# Patient Record
Sex: Female | Born: 1935 | Race: White | Hispanic: No | Smoking: Former smoker
Health system: Southern US, Community
[De-identification: ages and names within clinical notes are randomized; demographics above are authoritative.]

## PROBLEM LIST (undated history)

## (undated) DIAGNOSIS — F0280 Dementia in other diseases classified elsewhere without behavioral disturbance: Secondary | ICD-10-CM

## (undated) DIAGNOSIS — L03116 Cellulitis of left lower limb: Secondary | ICD-10-CM

## (undated) DIAGNOSIS — F329 Major depressive disorder, single episode, unspecified: Secondary | ICD-10-CM

## (undated) DIAGNOSIS — F32A Depression, unspecified: Secondary | ICD-10-CM

## (undated) DIAGNOSIS — G309 Alzheimer's disease, unspecified: Secondary | ICD-10-CM

## (undated) DIAGNOSIS — M1990 Unspecified osteoarthritis, unspecified site: Secondary | ICD-10-CM

## (undated) DIAGNOSIS — Z87442 Personal history of urinary calculi: Secondary | ICD-10-CM

## (undated) DIAGNOSIS — J209 Acute bronchitis, unspecified: Secondary | ICD-10-CM

## (undated) DIAGNOSIS — M353 Polymyalgia rheumatica: Secondary | ICD-10-CM

## (undated) DIAGNOSIS — L853 Xerosis cutis: Secondary | ICD-10-CM

## (undated) DIAGNOSIS — I10 Essential (primary) hypertension: Secondary | ICD-10-CM

## (undated) DIAGNOSIS — E7800 Pure hypercholesterolemia, unspecified: Secondary | ICD-10-CM

## (undated) DIAGNOSIS — H269 Unspecified cataract: Secondary | ICD-10-CM

## (undated) DIAGNOSIS — G3184 Mild cognitive impairment, so stated: Secondary | ICD-10-CM

## (undated) DIAGNOSIS — I8390 Asymptomatic varicose veins of unspecified lower extremity: Secondary | ICD-10-CM

## (undated) DIAGNOSIS — L309 Dermatitis, unspecified: Secondary | ICD-10-CM

## (undated) HISTORY — DX: Personal history of urinary calculi: Z87.442

## (undated) HISTORY — DX: Polymyalgia rheumatica: M35.3

## (undated) HISTORY — DX: Depression, unspecified: F32.A

## (undated) HISTORY — DX: Asymptomatic varicose veins of unspecified lower extremity: I83.90

## (undated) HISTORY — PX: JOINT REPLACEMENT: SHX530

## (undated) HISTORY — DX: Xerosis cutis: L85.3

## (undated) HISTORY — PX: OTHER SURGICAL HISTORY: SHX169

## (undated) HISTORY — DX: Unspecified cataract: H26.9

## (undated) HISTORY — DX: Alzheimer's disease, unspecified: G30.9

## (undated) HISTORY — DX: Unspecified osteoarthritis, unspecified site: M19.90

## (undated) HISTORY — DX: Pure hypercholesterolemia, unspecified: E78.00

## (undated) HISTORY — DX: Cellulitis of left lower limb: L03.116

## (undated) HISTORY — DX: Dermatitis, unspecified: L30.9

## (undated) HISTORY — DX: Essential (primary) hypertension: I10

## (undated) HISTORY — DX: Dementia in other diseases classified elsewhere, unspecified severity, without behavioral disturbance, psychotic disturbance, mood disturbance, and anxiety: F02.80

## (undated) HISTORY — PX: EYE SURGERY: SHX253

## (undated) HISTORY — DX: Mild cognitive impairment of uncertain or unknown etiology: G31.84

## (undated) HISTORY — DX: Major depressive disorder, single episode, unspecified: F32.9

## (undated) HISTORY — DX: Acute bronchitis, unspecified: J20.9

---

## 1997-12-15 ENCOUNTER — Other Ambulatory Visit: Admission: RE | Admit: 1997-12-15 | Discharge: 1997-12-15 | Payer: Self-pay | Admitting: *Deleted

## 1997-12-29 ENCOUNTER — Other Ambulatory Visit: Admission: RE | Admit: 1997-12-29 | Discharge: 1997-12-29 | Payer: Self-pay | Admitting: *Deleted

## 1999-06-08 ENCOUNTER — Encounter: Admission: RE | Admit: 1999-06-08 | Discharge: 1999-06-08 | Payer: Self-pay | Admitting: *Deleted

## 2001-08-05 ENCOUNTER — Other Ambulatory Visit: Admission: RE | Admit: 2001-08-05 | Discharge: 2001-08-05 | Payer: Self-pay | Admitting: *Deleted

## 2001-10-01 ENCOUNTER — Encounter: Admission: RE | Admit: 2001-10-01 | Discharge: 2001-10-01 | Payer: Self-pay | Admitting: Gastroenterology

## 2001-10-01 ENCOUNTER — Encounter: Payer: Self-pay | Admitting: Gastroenterology

## 2002-05-22 ENCOUNTER — Encounter: Admission: RE | Admit: 2002-05-22 | Discharge: 2002-05-22 | Payer: Self-pay | Admitting: Internal Medicine

## 2002-05-22 ENCOUNTER — Encounter: Payer: Self-pay | Admitting: Internal Medicine

## 2002-09-02 ENCOUNTER — Encounter: Payer: Self-pay | Admitting: Orthopedic Surgery

## 2002-09-08 ENCOUNTER — Inpatient Hospital Stay (HOSPITAL_COMMUNITY): Admission: RE | Admit: 2002-09-08 | Discharge: 2002-09-12 | Payer: Self-pay | Admitting: Orthopedic Surgery

## 2003-09-11 ENCOUNTER — Encounter: Admission: RE | Admit: 2003-09-11 | Discharge: 2003-09-11 | Payer: Self-pay | Admitting: Internal Medicine

## 2003-11-09 ENCOUNTER — Inpatient Hospital Stay (HOSPITAL_COMMUNITY): Admission: RE | Admit: 2003-11-09 | Discharge: 2003-11-13 | Payer: Self-pay | Admitting: Orthopedic Surgery

## 2003-12-07 ENCOUNTER — Ambulatory Visit (HOSPITAL_COMMUNITY): Admission: RE | Admit: 2003-12-07 | Discharge: 2003-12-07 | Payer: Self-pay | Admitting: Orthopedic Surgery

## 2004-12-02 ENCOUNTER — Ambulatory Visit (HOSPITAL_COMMUNITY): Admission: RE | Admit: 2004-12-02 | Discharge: 2004-12-02 | Payer: Self-pay | Admitting: Gastroenterology

## 2005-11-14 ENCOUNTER — Encounter: Admission: RE | Admit: 2005-11-14 | Discharge: 2005-11-14 | Payer: Self-pay | Admitting: Internal Medicine

## 2006-10-08 ENCOUNTER — Ambulatory Visit: Payer: Self-pay | Admitting: Internal Medicine

## 2006-10-08 LAB — CONVERTED CEMR LAB
Basophils Absolute: 0 10*3/uL (ref 0.0–0.1)
Eosinophils Absolute: 0.1 10*3/uL (ref 0.0–0.6)
HCT: 46.6 % — ABNORMAL HIGH (ref 36.0–46.0)
Hemoglobin: 16.1 g/dL — ABNORMAL HIGH (ref 12.0–15.0)
Lymphocytes Relative: 40.3 % (ref 12.0–46.0)
MCHC: 34.7 g/dL (ref 30.0–36.0)
MCV: 85.1 fL (ref 78.0–100.0)
Monocytes Absolute: 0.7 10*3/uL (ref 0.2–0.7)
Neutrophils Relative %: 45.6 % (ref 43.0–77.0)
RDW: 12.9 % (ref 11.5–14.6)

## 2006-11-09 ENCOUNTER — Ambulatory Visit: Payer: Self-pay | Admitting: Internal Medicine

## 2006-11-09 ENCOUNTER — Other Ambulatory Visit: Admission: RE | Admit: 2006-11-09 | Discharge: 2006-11-09 | Payer: Self-pay | Admitting: Internal Medicine

## 2006-11-09 ENCOUNTER — Encounter: Payer: Self-pay | Admitting: Internal Medicine

## 2006-11-12 ENCOUNTER — Emergency Department (HOSPITAL_COMMUNITY): Admission: EM | Admit: 2006-11-12 | Discharge: 2006-11-13 | Payer: Self-pay | Admitting: Emergency Medicine

## 2006-11-16 ENCOUNTER — Encounter: Admission: RE | Admit: 2006-11-16 | Discharge: 2006-11-16 | Payer: Self-pay | Admitting: Internal Medicine

## 2007-03-04 ENCOUNTER — Ambulatory Visit: Payer: Self-pay | Admitting: Internal Medicine

## 2007-03-04 LAB — CONVERTED CEMR LAB
Anti Nuclear Antibody(ANA): NEGATIVE
Vit D, 1,25-Dihydroxy: 48 (ref 20–57)

## 2007-03-05 LAB — CONVERTED CEMR LAB
AST: 26 units/L (ref 0–37)
Albumin: 3.3 g/dL — ABNORMAL LOW (ref 3.5–5.2)
Basophils Absolute: 0.1 10*3/uL (ref 0.0–0.1)
Chloride: 105 meq/L (ref 96–112)
Eosinophils Absolute: 0.2 10*3/uL (ref 0.0–0.6)
GFR calc Af Amer: 104 mL/min
GFR calc non Af Amer: 86 mL/min
HCT: 37.5 % (ref 36.0–46.0)
MCHC: 35.9 g/dL (ref 30.0–36.0)
MCV: 83.4 fL (ref 78.0–100.0)
Neutrophils Relative %: 60 % (ref 43.0–77.0)
Potassium: 4.1 meq/L (ref 3.5–5.1)
RBC: 4.5 M/uL (ref 3.87–5.11)
Rhuematoid fact SerPl-aCnc: <20 intl units/mL — ABNORMAL LOW (ref 0.0–20.0)
Sodium: 139 meq/L (ref 135–145)
TSH: 1.86 microintl units/mL (ref 0.35–5.50)

## 2007-03-07 ENCOUNTER — Encounter: Payer: Self-pay | Admitting: Internal Medicine

## 2007-03-07 DIAGNOSIS — R32 Unspecified urinary incontinence: Secondary | ICD-10-CM

## 2007-03-07 DIAGNOSIS — M1990 Unspecified osteoarthritis, unspecified site: Secondary | ICD-10-CM

## 2007-03-07 DIAGNOSIS — I10 Essential (primary) hypertension: Secondary | ICD-10-CM | POA: Insufficient documentation

## 2007-03-13 ENCOUNTER — Ambulatory Visit (HOSPITAL_COMMUNITY): Admission: RE | Admit: 2007-03-13 | Discharge: 2007-03-13 | Payer: Self-pay | Admitting: Orthopedic Surgery

## 2007-03-15 ENCOUNTER — Telehealth: Payer: Self-pay | Admitting: Internal Medicine

## 2007-03-19 ENCOUNTER — Telehealth: Payer: Self-pay | Admitting: Internal Medicine

## 2007-03-19 ENCOUNTER — Ambulatory Visit: Payer: Self-pay | Admitting: Internal Medicine

## 2007-03-21 LAB — CONVERTED CEMR LAB
Glucose, Urine, Semiquant: NEGATIVE
Specific Gravity, Urine: 1.02
pH: 7

## 2007-03-22 ENCOUNTER — Encounter: Payer: Self-pay | Admitting: Internal Medicine

## 2007-03-27 ENCOUNTER — Ambulatory Visit: Payer: Self-pay | Admitting: Internal Medicine

## 2007-03-27 ENCOUNTER — Encounter: Payer: Self-pay | Admitting: *Deleted

## 2007-03-27 DIAGNOSIS — M6281 Muscle weakness (generalized): Secondary | ICD-10-CM | POA: Insufficient documentation

## 2007-04-08 ENCOUNTER — Encounter: Payer: Self-pay | Admitting: Internal Medicine

## 2007-04-19 ENCOUNTER — Encounter: Payer: Self-pay | Admitting: Internal Medicine

## 2007-05-06 ENCOUNTER — Encounter: Payer: Self-pay | Admitting: Internal Medicine

## 2007-05-07 ENCOUNTER — Encounter: Payer: Self-pay | Admitting: Internal Medicine

## 2007-05-08 ENCOUNTER — Telehealth: Payer: Self-pay | Admitting: Internal Medicine

## 2007-06-18 ENCOUNTER — Encounter: Payer: Self-pay | Admitting: Internal Medicine

## 2007-06-24 ENCOUNTER — Telehealth: Payer: Self-pay | Admitting: Internal Medicine

## 2007-06-25 ENCOUNTER — Ambulatory Visit: Payer: Self-pay | Admitting: Internal Medicine

## 2007-06-25 ENCOUNTER — Telehealth: Payer: Self-pay | Admitting: Internal Medicine

## 2007-06-25 DIAGNOSIS — M353 Polymyalgia rheumatica: Secondary | ICD-10-CM

## 2007-06-25 DIAGNOSIS — R030 Elevated blood-pressure reading, without diagnosis of hypertension: Secondary | ICD-10-CM

## 2007-07-11 ENCOUNTER — Ambulatory Visit: Payer: Self-pay | Admitting: Internal Medicine

## 2007-07-11 LAB — CONVERTED CEMR LAB: Vit D, 1,25-Dihydroxy: 42 (ref 30–89)

## 2007-07-15 LAB — CONVERTED CEMR LAB
BUN: 20 mg/dL (ref 6–23)
Basophils Absolute: 0.1 10*3/uL (ref 0.0–0.1)
Basophils Relative: 0.8 % (ref 0.0–1.0)
CO2: 33 meq/L — ABNORMAL HIGH (ref 19–32)
Calcium: 10.3 mg/dL (ref 8.4–10.5)
Creatinine, Ser: 0.7 mg/dL (ref 0.4–1.2)
Free T4: 0.9 ng/dL (ref 0.6–1.6)
GFR calc Af Amer: 103 mL/min
MCHC: 35.2 g/dL (ref 30.0–36.0)
Monocytes Relative: 5.8 % (ref 3.0–11.0)
Platelets: 296 10*3/uL (ref 150–400)
RDW: 13.2 % (ref 11.5–14.6)
T3, Free: 2.5 pg/mL (ref 2.3–4.2)
Vitamin B-12: 590 pg/mL (ref 211–911)

## 2007-07-23 ENCOUNTER — Ambulatory Visit: Payer: Self-pay | Admitting: Internal Medicine

## 2007-07-24 ENCOUNTER — Telehealth: Payer: Self-pay | Admitting: Internal Medicine

## 2007-08-19 ENCOUNTER — Encounter: Payer: Self-pay | Admitting: Internal Medicine

## 2007-11-18 ENCOUNTER — Encounter: Payer: Self-pay | Admitting: Internal Medicine

## 2007-12-11 ENCOUNTER — Encounter: Admission: RE | Admit: 2007-12-11 | Discharge: 2007-12-11 | Payer: Self-pay | Admitting: Internal Medicine

## 2008-02-14 ENCOUNTER — Ambulatory Visit: Payer: Self-pay | Admitting: Internal Medicine

## 2008-02-14 DIAGNOSIS — S79929A Unspecified injury of unspecified thigh, initial encounter: Secondary | ICD-10-CM

## 2008-02-14 DIAGNOSIS — I8390 Asymptomatic varicose veins of unspecified lower extremity: Secondary | ICD-10-CM | POA: Insufficient documentation

## 2008-02-14 DIAGNOSIS — S99919A Unspecified injury of unspecified ankle, initial encounter: Secondary | ICD-10-CM

## 2008-02-14 DIAGNOSIS — L089 Local infection of the skin and subcutaneous tissue, unspecified: Secondary | ICD-10-CM

## 2008-02-14 DIAGNOSIS — S79912A Unspecified injury of left hip, initial encounter: Secondary | ICD-10-CM

## 2008-02-14 DIAGNOSIS — S8990XA Unspecified injury of unspecified lower leg, initial encounter: Secondary | ICD-10-CM

## 2008-02-19 ENCOUNTER — Encounter: Payer: Self-pay | Admitting: Internal Medicine

## 2008-02-26 ENCOUNTER — Encounter: Payer: Self-pay | Admitting: Internal Medicine

## 2008-04-30 ENCOUNTER — Encounter: Payer: Self-pay | Admitting: Internal Medicine

## 2008-05-05 ENCOUNTER — Encounter: Payer: Self-pay | Admitting: Internal Medicine

## 2008-05-06 ENCOUNTER — Ambulatory Visit (HOSPITAL_COMMUNITY): Admission: RE | Admit: 2008-05-06 | Discharge: 2008-05-06 | Payer: Self-pay | Admitting: Internal Medicine

## 2008-05-14 ENCOUNTER — Encounter: Payer: Self-pay | Admitting: Internal Medicine

## 2008-07-14 ENCOUNTER — Encounter: Payer: Self-pay | Admitting: Internal Medicine

## 2008-12-01 ENCOUNTER — Encounter: Payer: Self-pay | Admitting: Internal Medicine

## 2008-12-07 ENCOUNTER — Ambulatory Visit: Payer: Self-pay | Admitting: Internal Medicine

## 2008-12-10 LAB — CONVERTED CEMR LAB
BUN: 19 mg/dL (ref 6–23)
Basophils Absolute: 0 10*3/uL (ref 0.0–0.1)
CO2: 30 meq/L (ref 19–32)
Chloride: 105 meq/L (ref 96–112)
Creatinine, Ser: 0.8 mg/dL (ref 0.4–1.2)
Eosinophils Absolute: 0.2 10*3/uL (ref 0.0–0.7)
Glucose, Bld: 93 mg/dL (ref 70–99)
HCT: 44.4 % (ref 36.0–46.0)
Hemoglobin: 15.6 g/dL — ABNORMAL HIGH (ref 12.0–15.0)
Lymphs Abs: 3.1 10*3/uL (ref 0.7–4.0)
MCHC: 35 g/dL (ref 30.0–36.0)
Monocytes Absolute: 0.9 10*3/uL (ref 0.1–1.0)
Monocytes Relative: 8.7 % (ref 3.0–12.0)
Neutro Abs: 5.7 10*3/uL (ref 1.4–7.7)
Platelets: 223 10*3/uL (ref 150.0–400.0)
Potassium: 3.7 meq/L (ref 3.5–5.1)
Prothrombin Time: 10.8 s — ABNORMAL LOW (ref 10.9–13.3)
RDW: 12.7 % (ref 11.5–14.6)
TSH: 2 microintl units/mL (ref 0.35–5.50)

## 2008-12-15 ENCOUNTER — Telehealth: Payer: Self-pay | Admitting: *Deleted

## 2008-12-15 ENCOUNTER — Encounter: Admission: RE | Admit: 2008-12-15 | Discharge: 2008-12-15 | Payer: Self-pay | Admitting: Internal Medicine

## 2008-12-23 ENCOUNTER — Encounter: Payer: Self-pay | Admitting: Internal Medicine

## 2009-01-07 ENCOUNTER — Encounter: Admission: RE | Admit: 2009-01-07 | Discharge: 2009-01-07 | Payer: Self-pay | Admitting: Neurology

## 2009-02-07 ENCOUNTER — Emergency Department (HOSPITAL_COMMUNITY): Admission: EM | Admit: 2009-02-07 | Discharge: 2009-02-08 | Payer: Self-pay | Admitting: Emergency Medicine

## 2009-03-10 ENCOUNTER — Encounter: Payer: Self-pay | Admitting: Internal Medicine

## 2009-04-30 ENCOUNTER — Encounter: Payer: Self-pay | Admitting: Internal Medicine

## 2009-08-23 ENCOUNTER — Ambulatory Visit: Payer: Self-pay | Admitting: Family Medicine

## 2009-08-23 DIAGNOSIS — L03119 Cellulitis of unspecified part of limb: Secondary | ICD-10-CM

## 2009-08-23 DIAGNOSIS — L02419 Cutaneous abscess of limb, unspecified: Secondary | ICD-10-CM

## 2009-08-30 ENCOUNTER — Ambulatory Visit: Payer: Self-pay | Admitting: Internal Medicine

## 2009-08-30 ENCOUNTER — Telehealth: Payer: Self-pay | Admitting: Internal Medicine

## 2009-09-14 ENCOUNTER — Ambulatory Visit: Payer: Self-pay | Admitting: Internal Medicine

## 2009-10-13 ENCOUNTER — Encounter: Payer: Self-pay | Admitting: Internal Medicine

## 2009-10-22 ENCOUNTER — Telehealth: Payer: Self-pay | Admitting: Internal Medicine

## 2009-10-22 ENCOUNTER — Ambulatory Visit: Payer: Self-pay | Admitting: Internal Medicine

## 2009-10-22 DIAGNOSIS — L989 Disorder of the skin and subcutaneous tissue, unspecified: Secondary | ICD-10-CM | POA: Insufficient documentation

## 2009-11-02 ENCOUNTER — Ambulatory Visit: Payer: Self-pay | Admitting: Internal Medicine

## 2009-11-02 DIAGNOSIS — L253 Unspecified contact dermatitis due to other chemical products: Secondary | ICD-10-CM

## 2009-12-28 ENCOUNTER — Ambulatory Visit: Payer: Self-pay | Admitting: Internal Medicine

## 2009-12-28 DIAGNOSIS — N390 Urinary tract infection, site not specified: Secondary | ICD-10-CM | POA: Insufficient documentation

## 2009-12-28 DIAGNOSIS — R42 Dizziness and giddiness: Secondary | ICD-10-CM

## 2009-12-28 LAB — CONVERTED CEMR LAB
Blood in Urine, dipstick: NEGATIVE
Nitrite: POSITIVE
Protein, U semiquant: NEGATIVE
Urobilinogen, UA: 0.2
pH: 6

## 2010-02-10 ENCOUNTER — Encounter: Payer: Self-pay | Admitting: Internal Medicine

## 2010-03-08 ENCOUNTER — Ambulatory Visit: Payer: Self-pay | Admitting: Internal Medicine

## 2010-03-08 LAB — CONVERTED CEMR LAB: Vit D, 25-Hydroxy: 62 ng/mL (ref 30–89)

## 2010-03-21 LAB — CONVERTED CEMR LAB
BUN: 25 mg/dL — ABNORMAL HIGH (ref 6–23)
Basophils Absolute: 0.1 10*3/uL (ref 0.0–0.1)
Basophils Relative: 0.9 % (ref 0.0–3.0)
Bilirubin, Direct: 0.1 mg/dL (ref 0.0–0.3)
Creatinine, Ser: 0.7 mg/dL (ref 0.4–1.2)
Direct LDL: 161.6 mg/dL
Eosinophils Absolute: 0.1 10*3/uL (ref 0.0–0.7)
Folate: >20 ng/mL
GFR calc non Af Amer: 90.71 mL/min (ref >60)
HCT: 44.7 % (ref 36.0–46.0)
HDL: 57.9 mg/dL (ref >39.00)
Hemoglobin: 15.4 g/dL — ABNORMAL HIGH (ref 12.0–15.0)
Lymphocytes Relative: 32.6 % (ref 12.0–46.0)
Lymphs Abs: 2.2 10*3/uL (ref 0.7–4.0)
MCHC: 34.5 g/dL (ref 30.0–36.0)
Neutro Abs: 4.1 10*3/uL (ref 1.4–7.7)
RBC: 5.01 M/uL (ref 3.87–5.11)
RDW: 13.8 % (ref 11.5–14.6)
Sed Rate: 6 mm/hr (ref 0–22)
Total Bilirubin: 0.8 mg/dL (ref 0.3–1.2)
Total CHOL/HDL Ratio: 4
Triglycerides: 86 mg/dL (ref 0.0–149.0)
Vitamin B-12: 738 pg/mL (ref 211–911)

## 2010-03-28 ENCOUNTER — Telehealth: Payer: Self-pay | Admitting: *Deleted

## 2010-03-28 ENCOUNTER — Encounter: Payer: Self-pay | Admitting: Internal Medicine

## 2010-03-28 ENCOUNTER — Ambulatory Visit: Payer: Self-pay | Admitting: Family Medicine

## 2010-04-06 ENCOUNTER — Ambulatory Visit: Payer: Self-pay | Admitting: Internal Medicine

## 2010-04-13 LAB — CONVERTED CEMR LAB
Calcium, Total (PTH): 10.1 mg/dL (ref 8.4–10.5)
PTH: 19.5 pg/mL (ref 14.0–72.0)

## 2010-04-27 ENCOUNTER — Encounter: Admission: RE | Admit: 2010-04-27 | Discharge: 2010-04-27 | Payer: Self-pay | Admitting: Internal Medicine

## 2010-05-09 ENCOUNTER — Telehealth (INDEPENDENT_AMBULATORY_CARE_PROVIDER_SITE_OTHER): Payer: Self-pay | Admitting: *Deleted

## 2010-05-10 ENCOUNTER — Ambulatory Visit: Payer: Self-pay | Admitting: Internal Medicine

## 2010-05-10 DIAGNOSIS — G3184 Mild cognitive impairment, so stated: Secondary | ICD-10-CM | POA: Insufficient documentation

## 2010-05-10 DIAGNOSIS — L089 Local infection of the skin and subcutaneous tissue, unspecified: Secondary | ICD-10-CM | POA: Insufficient documentation

## 2010-05-10 DIAGNOSIS — L259 Unspecified contact dermatitis, unspecified cause: Secondary | ICD-10-CM

## 2010-05-19 ENCOUNTER — Ambulatory Visit: Payer: Self-pay | Admitting: Family Medicine

## 2010-07-13 ENCOUNTER — Encounter: Payer: Self-pay | Admitting: Internal Medicine

## 2010-07-14 ENCOUNTER — Ambulatory Visit: Payer: Self-pay | Admitting: Internal Medicine

## 2010-07-19 ENCOUNTER — Ambulatory Visit: Payer: Self-pay | Admitting: Internal Medicine

## 2010-09-29 NOTE — Letter (Signed)
Summary: [facility]   Imported By: Maryln Gottron 11/04/2009 12:49:56  _____________________________________________________________________  External Attachment:    Type:   Image     Comment:   External Document

## 2010-09-29 NOTE — Miscellaneous (Signed)
Summary: BONE DENSITY  Clinical Lists Changes  Orders: Added new Test order of T-Bone Densitometry (77080) - Signed Added new Test order of T-Lumbar Vertebral Assessment (77082) - Signed 

## 2010-09-29 NOTE — Progress Notes (Signed)
Summary: leg getting better but still red  Phone Note Outgoing Call Call back at [facility] Phone [phone]   Call placed by: Romualdo Bolk, CMA (AAMA),  August 30, 2009 11:52 AM Summary of Call: Spoke to pt and leg looks like it's getting better but it's still red. Initial call taken by: Romualdo Bolk, CMA (AAMA),  August 30, 2009 11:53 AM

## 2010-09-29 NOTE — Progress Notes (Signed)
Summary: Pt req to get a copy of her med list  Phone Note Call from Patient Call back at Home Phone [phone] Call back at [phone]   Caller: Patient Summary of Call: Pt called and needs to get copy of her med list. Pt will come by and pick it up when ready.  Initial call taken by: Lucy Antigua,  March 28, 2010 11:07 AM  Follow-up for Phone Call        List ready to pick up and pt aware. Follow-up by: Romualdo Bolk, CMA (AAMA),  March 28, 2010 12:44 PM

## 2010-09-29 NOTE — Letter (Signed)
Summary: [facility]   Imported By: Maryln Gottron 08/03/2010 10:30:53  _____________________________________________________________________  External Attachment:    Type:   Image     Comment:   External Document

## 2010-09-29 NOTE — Assessment & Plan Note (Signed)
Summary: dog scratch/ssc   Vital Signs:  Patient profile:   77 year old female Menstrual status:  postmenopausal Weight:      173 pounds Temp:     98.3 degrees F oral Pulse rate:   64 / minute BP sitting:   128 / 88  (right arm) Cuff size:   regular  Vitals Entered By: Romualdo Bolk, CMA (AAMA) (July 14, 2010 9:45 AM) CC: Dog Scatch rt leg x 1 week ago.  Pt noticed it yesterday. It is red and swollen. Pt states that it is itching.   History of Present Illness: Tricia Mendez comes in today  sda for above  problem. One week ago dog scratched herlower leg and  had skin flap .  cleaned and dressed with strips  .  increasing rdness recently and had ap with Dr  yesterday who put her on an antibiotic   ?    (called pharmacy ) doxycycline 100 two times a day  and told to come in today.  no fever no sig pain. No fall .   No bleeding   Preventive Screening-Counseling & Management  Alcohol-Tobacco     Alcohol drinks/day: 1     Alcohol type: wine     Smoking Status: never     Year Quit: age 77  Caffeine-Diet-Exercise     Caffeine use/day: 2     Does Patient Exercise: no  Current Medications (verified): 1)  Atenolol-Chlorthalidone 50-25 Mg  Tabs (Atenolol-Chlorthalidone) .Marland Kitchen.. 1 By Mouth Once Daily 2)  Vesicare 10 Mg  Tabs (Solifenacin Succinate) 3)  Multi-Vitamin   Tabs (Multiple Vitamin) 4)  Bl Vitamin C 500 Mg  Tabs (Ascorbic Acid) 5)  Vitamin D 1000 Unit  Tabs (Cholecalciferol) 6)  Triamcinolone Acetonide 0.1 % Crea (Triamcinolone Acetonide) .... Used Twice Daily 7)  Donepezil Hcl 10 Mg Tabs (Donepezil Hcl) .... ? Dose One A Day  Per Dr Sandria Manly 8)  Fluticasone Propionate 0.05 % Crea (Fluticasone Propionate) .... Use As Directed  Allergies (verified): No Known Drug Allergies  Past History:  Past medical, surgical, family and social histories (including risk factors) reviewed, and no changes noted (except as noted below).  Past Medical History: Reviewed history from  12/28/2009 and no changes required. Hypertension Osteoarthritis Urinary incontinence PMR G4P3 ? Mild MCI  Past Surgical History: Reviewed history from 12/07/2008 and no changes required. Total knee replacement x 2 Kidney Stone Excision Cataract extraction  Lipoma removal  Past History:  Care Management:  Rheumatology:Dr Zieminski Neurology: Vonita Moss Gastroenterology: Randa Evens Dermatology: Margo Aye  Family History: Reviewed history from 12/07/2008 and no changes required. see old chart fa cva 61 mom 101 melanoma Family History Osteoporosis   Social History: Reviewed history from 03/08/2010 and no changes required. see old paper record chart widowed tobacco x4 yrs when teen ocassional alcohol     Independent .  adl   Review of Systems  The patient denies anorexia, fever, weight loss, weight gain, vision loss, transient blindness, difficulty walking, abnormal bleeding, enlarged lymph nodes, and angioedema.    Physical Exam  General:  Well-developed,well-nourished,in no acute distress; alert,appropriate and cooperative throughout examination Extremities:  righ lower extremity  ,shin  area with  2 avulsion skin injuries  2 cm and 2. 5   wih shallow base   and redness  of 3-4 cm around the wound  some edem a  no streaking  or extreme tenderness.    Neurologic:  alert & oriented X3 and gait normal.   some problems remembering  meds and doses  Skin:  turgor normal and color normal.  see leg extremity  also righ t thigh with  red [place] induration with excoraition she says itches.  Psych:  Oriented X3, good eye contact, not anxious appearing, and not depressed appearing.     Impression & Recommendations:  Problem # 1:  UNSPEC LOCAL INFECTION SKIN&SUBCUTANEOUS TISSUE (ICD-686.9) from dog scratch  avulsion  skin  and secondary infection.  will be slow to heal because of area and  type of injury  . No systemic effects.  add antibiotic  coverage    placed on doxy.   Local care  discussed.   return office visit next week   .   call as needed   Problem # 2:  MILD COGNITIVE IMPAIRMENT SO STATED (ICD-331.83) didnt remember name of med  but knew how to  get it. used cell withoutproblem  Problem # 3:  POLYMYALGIA RHEUMATICA (ICD-725)  Diagnostics Reviewed:  ESR: 6 (03/08/2010)   Hgb: 15.4 (03/08/2010)   HCT: 44.7 (03/08/2010)   Platelets: 247.0 (03/08/2010) RBC: 5.01 (03/08/2010)   WBC: 6.8 (03/08/2010)  Complete Medication List: 1)  Atenolol-chlorthalidone 50-25 Mg Tabs (Atenolol-chlorthalidone) .Marland Kitchen.. 1 by mouth once daily 2)  Vesicare 10 Mg Tabs (Solifenacin succinate) 3)  Multi-vitamin Tabs (Multiple vitamin) 4)  Bl Vitamin C 500 Mg Tabs (Ascorbic acid) 5)  Vitamin D 1000 Unit Tabs (Cholecalciferol) 6)  Triamcinolone Acetonide 0.1 % Crea (Triamcinolone acetonide) .... Used twice daily 7)  Donepezil Hcl 10 Mg Tabs (Donepezil hcl) .... ? dose one a day  per dr love 8)  Fluticasone Propionate 0.05 % Crea (Fluticasone propionate) .... Use as directed 9)  Doxycycline Hyclate 100 Mg Tabs (Doxycycline hyclate) .Marland Kitchen.. 1 by mouth two times a day 10)  Augmentin 875-125 Mg Tabs (Amoxicillin-pot clavulanate) .Marland Kitchen.. 1 by mouth two times a day  Patient Instructions: 1)  stay on the doxycyline  and   the other antibiotic. 2)  gently clean and topical antibiotic  and nonstick covering  daily. 3)  return office visit next week to check 4)  Telfa pads and Coban cling Prescriptions: AUGMENTIN 875-125 MG TABS (AMOXICILLIN-POT CLAVULANATE) 1 by mouth two times a day  #14 x 0   Entered by:   Romualdo Bolk, CMA (AAMA)   Authorized by:   Madelin Headings MD   Signed by:   Romualdo Bolk, CMA (AAMA) on 07/14/2010   Method used:   Electronically to        [id number] (retail[facility]       [address]       [place], [place]  [address]       Ph: [phone] or [phone]       Fax: [phone]   RxID:   [id number]    Orders Added: 1)  Est. Patient Level IV  [27253]    Patient Instructions: 1)  stay on the doxycyline  and   the other antibiotic. 2)  gently clean and topical antibiotic  and nonstick covering  daily. 3)  return office visit next week to check 4)  Telfa pads and Coban cling

## 2010-09-29 NOTE — Assessment & Plan Note (Signed)
Summary: LEG ISSUES//CCM   Vital Signs:  Patient profile:   77 year old female Menstrual status:  postmenopausal Weight:      170 pounds Pulse rate:   72 / minute Resp:     14 per minute  Vitals Entered By: Romualdo Bolk, CMA Duncan Dull) (October 22, 2009 2:37 PM) CC: ? varicose veins on rt leg and pt also wants to discuss why she is not taking asa   History of Present Illness: Tricia Mendez comesin today for    SDa  . Has lesion RLE without trauma or tenderness that has been there for months and Dr Herma Carson said see PCP.   No progression and no rx . Has VV but this looks different.  NO swelling .   Also has been off asa for a while and wishes to talk about this as all of her friends are on asa.   Preventive Screening-Counseling & Management  Alcohol-Tobacco     Alcohol drinks/day: 1     Alcohol type: wine     Smoking Status: quit     Year Quit: age 63  Caffeine-Diet-Exercise     Caffeine use/day: 2     Does Patient Exercise: no  Current Medications (verified): 1)  Atenolol-Chlorthalidone 50-25 Mg  Tabs (Atenolol-Chlorthalidone) .Marland Kitchen.. 1 By Mouth Once Daily 2)  Vesicare 10 Mg  Tabs (Solifenacin Succinate) 3)  Multi-Vitamin   Tabs (Multiple Vitamin) 4)  Calcium 600 1500 Mg  Tabs (Calcium Carbonate) 5)  Senokot 8.6 Mg  Tabs (Sennosides) 6)  Bl Vitamin C 500 Mg  Tabs (Ascorbic Acid) 7)  Prednisone 5 Mg Tabs (Prednisone) .... 1/2 By Mouth Once Daily 8)  Vitamin D 1000 Unit  Tabs (Cholecalciferol)  Allergies (verified): No Known Drug Allergies  Past History:  Past medical, surgical, family and social histories (including risk factors) reviewed, and no changes noted (except as noted below).  Past Medical History: Reviewed history from 02/14/2008 and no changes required. Hypertension Osteoarthritis Urinary incontinence PMR G4P3  Past Surgical History: Reviewed history from 12/07/2008 and no changes required. Total knee replacement x 2 Kidney Stone Excision Cataract  extraction  Lipoma removal  Past History:  Care Management:  Rheumatology:Dr Zieminski Neurology: Vonita Moss Gastroenterology: Randa Evens  Family History: Reviewed history from 12/07/2008 and no changes required. see old chart fa cva 52 mom 101 melanoma Family History Osteoporosis   Social History: Reviewed history from 09/14/2009 and no changes required. see old paper record chart widowed tobacco x4 yrs when teen ocassional alcohol    helping with friends estate  resolutionCaffeine use/day:  2  Review of Systems  The patient denies anorexia, fever, weight loss, weight gain, chest pain, syncope, muscle weakness, difficulty walking, abnormal bleeding, enlarged lymph nodes, and angioedema.         mild shoulder tendernss but over all feels quite well.  no falls  Physical Exam  General:  Well-developed,well-nourished,in no acute distress; alert,appropriate and cooperative throughout examination Head:  normocephalic and atraumatic.   Neck:  No deformities, masses, or tenderness noted. Lungs:  Normal respiratory effort, chest expands symmetrically. Lungs are clear to auscultation, no crackles or wheezes.no dullness.   Heart:  Normal rate and regular rhythm. S1 and S2 normal without gallop, murmur, click, rub or other extra sounds.no lifts.   Pulses:  pulses intact without delay   Extremities:  right le no sig edema   small varicosities    Neurologic:  but does repeat questions ans sometimes slow to answeralert & oriented X3 and gait  normal.   Skin:  some senil  purpura but   RLE  with 5-6 cm purple brown area with defined  border  like a vein but not blanchable     above ankle  some mild tenderness no scaling or vesicle. Cervical Nodes:  No lymphadenopathy noted Psych:  Oriented X3, normally interactive, and good eye contact.     Impression & Recommendations:  Problem # 1:  SKIN LESION (ICD-709.9)  ? cause  of this  ? if vascular and dependent  bleeding vs other dx ? if  needs bx   need  derm to see .   Orders: Dermatology Referral (Derma)  Problem # 2:  POLYMYALGIA RHEUMATICA (ICD-725)  Her updated medication list for this problem includes:    Prednisone 5 Mg Tabs (Prednisone) .Marland Kitchen... 1/2 by mouth once daily  Orders: Dermatology Referral (Derma)  Problem # 3:  VARICOSE VEINS, LOWER EXTREMITIES (ICD-454.9) Assessment: Comment Only  Problem # 4:  disc about asa  disc use   and i beleive she was having easy bruisng at one time   after  skinevlauation  she could restart   Problem # 5:  BEREAVEMENT UNCOMPLICATED NEC (ICD-V62.82) Assessment: Comment Only  Complete Medication List: 1)  Atenolol-chlorthalidone 50-25 Mg Tabs (Atenolol-chlorthalidone) .Marland Kitchen.. 1 by mouth once daily 2)  Vesicare 10 Mg Tabs (Solifenacin succinate) 3)  Multi-vitamin Tabs (Multiple vitamin) 4)  Calcium 600 1500 Mg Tabs (Calcium carbonate) 5)  Senokot 8.6 Mg Tabs (Sennosides) 6)  Bl Vitamin C 500 Mg Tabs (Ascorbic acid) 7)  Prednisone 5 Mg Tabs (Prednisone) .... 1/2 by mouth once daily 8)  Vitamin D 1000 Unit Tabs (Cholecalciferol)  Patient Instructions: 1)  will   get you a dermatoilogy. consult     2)  for the dates we discussed. 3)  Go on your trips . 4)  No asa until we   diagnose the right leg area.  greater than 50% of visit spent in counseling   25  minutes  lesion also viewed by Dr Clent Ridges.

## 2010-09-29 NOTE — Letter (Signed)
Summary: [facility]   Imported By: Maryln Gottron 02/24/2010 13:15:57  _____________________________________________________________________  External Attachment:    Type:   Image     Comment:   External Document

## 2010-09-29 NOTE — Assessment & Plan Note (Signed)
Summary: 1 week follow up/cjr/pt rescd//ccm   Vital Signs:  Patient profile:   77 year old female Menstrual status:  postmenopausal Height:      169 inches Weight:      169 pounds BMI:     4.18 Temp:     98.0 degrees F oral Pulse rate:   72 / minute BP sitting:   120 / 80  (left arm) Cuff size:   regular  Vitals Entered By: Romualdo Bolk, CMA (AAMA) (September 14, 2009 11:31 AM) CC: Follow-up visit on left leg wound   History of Present Illness: Tricia Mendez comes in today for   follow up of her wound and cellulitis. has finished med and  using polysporin.... thinks its much better and no pain or increasing swelling.   No se of med . NOrmal activities now.   NO fever or systemic symptom .  Preventive Screening-Counseling & Management  Alcohol-Tobacco     Alcohol drinks/day: 1     Alcohol type: wine     Smoking Status: quit     Year Quit: age 50  Caffeine-Diet-Exercise     Does Patient Exercise: no  Current Medications (verified): 1)  Atenolol-Chlorthalidone 50-25 Mg  Tabs (Atenolol-Chlorthalidone) .Marland Kitchen.. 1 By Mouth Once Daily 2)  Vesicare 10 Mg  Tabs (Solifenacin Succinate) 3)  Multi-Vitamin   Tabs (Multiple Vitamin) 4)  Calcium 600 1500 Mg  Tabs (Calcium Carbonate) 5)  Senokot 8.6 Mg  Tabs (Sennosides) 6)  Bl Vitamin C 500 Mg  Tabs (Ascorbic Acid) 7)  Prednisone 1 Mg Tabs (Prednisone) .... 4 Mg Once Daily 8)  Vitamin D 1000 Unit  Tabs (Cholecalciferol)  Allergies (verified): No Known Drug Allergies  Past History:  Past medical, surgical, family and social histories (including risk factors) reviewed for relevance to current acute and chronic problems.  Past Medical History: Reviewed history from 02/14/2008 and no changes required. Hypertension Osteoarthritis Urinary incontinence PMR G4P3  Past Surgical History: Reviewed history from 12/07/2008 and no changes required. Total knee replacement x 2 Kidney Stone Excision Cataract extraction  Lipoma  removal  Family History: Reviewed history from 12/07/2008 and no changes required. see old chart fa cva 68 mom 101 melanoma Family History Osteoporosis   Social History: Reviewed history from 12/07/2008 and no changes required. see old paper record chart widowed tobacco x4 yrs when teen ocassional alcohol    helping with friends estate  resolution  Physical Exam  General:  Well-developed,well-nourished,in no acute distress; alert,appropriate and cooperative throughout examination Skin:  left leg  with minimal redness around a healing superficial wound with minial eschar  1 -2 cm  no edema  no streaking Psych:  normally interactive and good eye contact.     Impression & Recommendations:  Problem # 1:  ABRASION, LEG, INFECTED (ICD-916.9) Assessment Improved resolving  . continue local care and protection and recheck as needed or for routine return office visit .   Problem # 2:  VARICOSE VEINS, LOWER EXTREMITIES (ICD-454.9) Assessment: Comment Only  Complete Medication List: 1)  Atenolol-chlorthalidone 50-25 Mg Tabs (Atenolol-chlorthalidone) .Marland Kitchen.. 1 by mouth once daily 2)  Vesicare 10 Mg Tabs (Solifenacin succinate) 3)  Multi-vitamin Tabs (Multiple vitamin) 4)  Calcium 600 1500 Mg Tabs (Calcium carbonate) 5)  Senokot 8.6 Mg Tabs (Sennosides) 6)  Bl Vitamin C 500 Mg Tabs (Ascorbic acid) 7)  Prednisone 1 Mg Tabs (Prednisone) .... 4 mg once daily 8)  Vitamin D 1000 Unit Tabs (Cholecalciferol)

## 2010-09-29 NOTE — Progress Notes (Signed)
Summary: Pt called and already has dermatologist. No referral needed  Phone Note Call from Patient Call back at Home Phone [phone]   Caller: Patient Summary of Call: Pt called and said that she already  has a dermatolgist, so she will not be needing a referral.   Initial call taken by: Lucy Antigua,  October 22, 2009 4:10 PM  Follow-up for Phone Call        ok    please cancel the  derm referral

## 2010-09-29 NOTE — Assessment & Plan Note (Signed)
Summary: L LOWER LEG PAIN / REDNESS, SWELLING TO AREA // RS   Vital Signs:  Patient profile:   77 year old female Menstrual status:  postmenopausal Weight:      169 pounds Temp:     98.7 degrees F oral Pulse rate:   66 / minute BP sitting:   110 / 60  (left arm) Cuff size:   regular  Vitals Entered By: Romualdo Bolk, CMA (AAMA) (August 30, 2009 4:16 PM) CC: Follow-up visit on left lower leg wound. Leg is slightly red but pt states that it looks better than it did.    History of Present Illness: Tricia Mendez comesin today for  continued problems with infection abrasion  to her left leg. She was seen last week and given IM rosephinn for cellilitis. Better but stil red .Marland KitchenMarland KitchenOn kelfex.  has a few pills left .  No fever . no mumbness or swelling. NO se of meds .   Of note    her close friend and companion dorothy cooper  also a patient here  died  in 2023/08/11 from lung cancer and she has been  helping with arrambements for her estate.   Preventive Screening-Counseling & Management  Alcohol-Tobacco     Alcohol drinks/day: 1     Alcohol type: wine     Smoking Status: quit     Year Quit: age 89  Caffeine-Diet-Exercise     Does Patient Exercise: no  Current Medications (verified): 1)  Atenolol-Chlorthalidone 50-25 Mg  Tabs (Atenolol-Chlorthalidone) .Marland Kitchen.. 1 By Mouth Once Daily 2)  Vesicare 10 Mg  Tabs (Solifenacin Succinate) 3)  Multi-Vitamin   Tabs (Multiple Vitamin) 4)  Calcium 600 1500 Mg  Tabs (Calcium Carbonate) 5)  Senokot 8.6 Mg  Tabs (Sennosides) 6)  Bl Vitamin C 500 Mg  Tabs (Ascorbic Acid) 7)  Prednisone 1 Mg Tabs (Prednisone) .... 4 Mg Once Daily 8)  Vitamin D 1000 Unit  Tabs (Cholecalciferol) 9)  Keflex 500 Mg Caps (Cephalexin) .... Three Times A Day  Allergies (verified): No Known Drug Allergies  Past History:  Past medical, surgical, family and social histories (including risk factors) reviewed for relevance to current acute and chronic problems.  Past  Medical History: Reviewed history from 02/14/2008 and no changes required. Hypertension Osteoarthritis Urinary incontinence PMR G4P3  Past Surgical History: Reviewed history from 12/07/2008 and no changes required. Total knee replacement x 2 Kidney Stone Excision Cataract extraction  Lipoma removal  Family History: Reviewed history from 12/07/2008 and no changes required. see old chart fa cva 44 mom 101 melanoma Family History Osteoporosis   Social History: Reviewed history from 12/07/2008 and no changes required. see old paper record chart widowed tobacco x4 yrs when teen ocassional alcohol     Review of Systems  The patient denies anorexia, fever, weight loss, weight gain, abnormal bleeding, enlarged lymph nodes, and angioedema.    Physical Exam  General:  Well-developed,well-nourished,in no acute distress; alert,appropriate and cooperative throughout examination Head:  normocephalic and atraumatic.   Pulses:  nl cap refill  Skin:  right lower ext with 2 cm abrasion with some dark exchar  and 2 cm erythema surrounding this  no streaking and no discharge .   no edema Psych:  Oriented X3, normally interactive, good eye contact, and not anxious appearing.     Impression & Recommendations:  Problem # 1:  CELLULITIS, LEG, LEFT (ICD-682.6) Assessment Improved  however    still  continuing   will change to broader spectrum  antibiotic  Her updated medication list for this problem includes:    Avelox 400 Mg Tabs (Moxifloxacin hcl) .Marland Kitchen... 1 by mouth once daily  Orders: Prescription Created Electronically [phone])  Problem # 2:  ABRASION, LEG, INFECTED (ICD-916.9)  Problem # 3:  POLYMYALGIA RHEUMATICA (ICD-725) Assessment: Comment Only  Her updated medication list for this problem includes:    Prednisone 1 Mg Tabs (Prednisone) .Marland KitchenMarland KitchenMarland KitchenMarland Kitchen 4 mg once daily  Problem # 4:  BEREAVEMENT UNCOMPLICATED NEC (ICD-V62.82) normal   course .   counseled   Complete Medication  List: 1)  Atenolol-chlorthalidone 50-25 Mg Tabs (Atenolol-chlorthalidone) .Marland Kitchen.. 1 by mouth once daily 2)  Vesicare 10 Mg Tabs (Solifenacin succinate) 3)  Multi-vitamin Tabs (Multiple vitamin) 4)  Calcium 600 1500 Mg Tabs (Calcium carbonate) 5)  Senokot 8.6 Mg Tabs (Sennosides) 6)  Bl Vitamin C 500 Mg Tabs (Ascorbic acid) 7)  Prednisone 1 Mg Tabs (Prednisone) .... 4 mg once daily 8)  Vitamin D 1000 Unit Tabs (Cholecalciferol) 9)  Avelox 400 Mg Tabs (Moxifloxacin hcl) .Marland Kitchen.. 1 by mouth once daily  Patient Instructions: 1)  continue to do wound care daily.Marland Kitchen   2)  change antibiotic.    3)  rov in about 1 week  t ocheck area before off antibiotic.  Prescriptions: AVELOX 400 MG TABS (MOXIFLOXACIN HCL) 1 by mouth once daily  #10 x 1   Entered and Authorized by:   Madelin Headings MD   Signed by:   Madelin Headings MD on 08/30/2009   Method used:   Electronically to        [facility]       [address]       [place], [place]  [address]       Ph: [phone] or [phone]       Fax: [phone]   RxID:   [id number]

## 2010-09-29 NOTE — Assessment & Plan Note (Signed)
Summary: leg wound not any better ok per Burchette/ssc   Vital Signs:  Patient profile:   77 year old female Menstrual status:  postmenopausal Temp:     98.0 degrees F oral BP sitting:   140 / 80  (left arm) Cuff size:   regular  Vitals Entered By: Sid Falcon LPN (May 19, 2010 4:53 PM)  History of Present Illness: Here  with chief complaint of L forearm pruritic rash which started about  3 days ago.  No change of detergents or soaps.  No hx of contact allergy. Has not tried anything topically.  No pain. Symptoms of mild to moderate severity. No other areas of rash.  L leg wound with ? of recent sec infection.  Overall improved per pt. Finishing up Cipro.  No fever or drainage.  Cleaning daily and topical antibiotic   Hx PMR on very low dose steroids.  Allergies (verified): No Known Drug Allergies  Past History:  Past Medical History: Last updated: 12/28/2009 Hypertension Osteoarthritis Urinary incontinence PMR G4P3 ? Mild MCI PMH reviewed for relevance  Review of Systems  The patient denies fever, weight loss, peripheral edema, abdominal pain, muscle weakness, difficulty walking, and enlarged lymph nodes.    Physical Exam  General:  Well-developed,well-nourished,in no acute distress; alert,appropriate and cooperative throughout examination Lungs:  Normal respiratory effort, chest expands symmetrically. Lungs are clear to auscultation, no crackles or wheezes. Heart:  normal rate and regular rhythm.   Extremities:  L leg wound about 2 by 2 cm with good granulation tissue.  No purulence and no abscess.  Some mild periwound erythema but no warmth. Skin:  L forearm about 7 by 7 cm area of slightly raised , nontender vesicular rash.  Mild erythematous base.   Impression & Recommendations:  Problem # 1:  DERMATITIS (ICD-692.9) Assessment New  suspect contact derm. L forearm-?precipitant.  Topical steroid.  Discussed possible systemic steroid but pt would  like to try topical first. Her updated medication list for this problem includes:    Triamcinolone Acetonide 0.1 % Crea (Triamcinolone acetonide) ..... Used twice daily    Fluticasone Propionate 0.05 % Crea (Fluticasone propionate) ..... Use as directed  Orders: Prescription Created Electronically [id number])  Problem # 2:  UNSPEC LOCAL INFECTION SKIN&SUBCUTANEOUS TISSUE (ICD-686.9) wound healing slowly.  Cont good local wound care.  Complete Medication List: 1)  Atenolol-chlorthalidone 50-25 Mg Tabs (Atenolol-chlorthalidone) .Marland Kitchen.. 1 by mouth once daily 2)  Vesicare 10 Mg Tabs (Solifenacin succinate) 3)  Multi-vitamin Tabs (Multiple vitamin) 4)  Bl Vitamin C 500 Mg Tabs (Ascorbic acid) 5)  Vitamin D 1000 Unit Tabs (Cholecalciferol) 6)  Triamcinolone Acetonide 0.1 % Crea (Triamcinolone acetonide) .... Used twice daily 7)  Donepezil Hcl 10 Mg Tabs (Donepezil hcl) .... ? dose one a day  per dr love 8)  Fluticasone Propionate 0.05 % Crea (Fluticasone propionate) .... Use as directed 9)  Ciprofloxacin Hcl 500 Mg Tabs (Ciprofloxacin hcl) .Marland Kitchen.. 1 by mouth two times a day  for skin infection  Patient Instructions: 1)  use triamcinolone cream twice daily to left forearm rash 2)  Followup with Dr. Fabian Sharp next week if not improving Prescriptions: TRIAMCINOLONE ACETONIDE 0.1 % CREA (TRIAMCINOLONE ACETONIDE) used twice daily  #30 gm x 2   Entered and Authorized by:   Evelena Peat MD   Signed by:   Evelena Peat MD on 05/19/2010   Method used:   Electronically to        [id number] (retail)  [address]       [place], [place]  [address]       Ph: [phone] or [phone]       Fax: [phone]   RxID:   [id number]

## 2010-09-29 NOTE — Assessment & Plan Note (Signed)
Summary: stasis ulcer/dm   Vital Signs:  Patient profile:   77 year old female Menstrual status:  postmenopausal Weight:      170 pounds Temp:     98.9 degrees F oral Pulse rate:   66 / minute BP sitting:   100 / 70  (right arm) Cuff size:   regular  Vitals Entered By: Romualdo Bolk, CMA (AAMA) (May 10, 2010 10:42 AM) CC: Left lower leg open wound x 2 weeks. Pt hasn't been putting anything on it. Redness is from mid thigh down. No pus has come out of it but it did have alot of blood coming out of it.    History of Present Illness: Tricia Mendez  comes in today after   referral from nurse at her resident and concern about injury now infected.    She reports  bumping  leg and having a skin abrasion laceration  and now over the last days  to lle and now red  and some tender .NUrse checked tis and said needs to be  checked . see phone note   Friends dying and have cancer  stress. Otherwise no change in health status .  NOt on steroid high dose and no diabetes.   No falling  episodes.   Has chronic venous changes le  and given topical steroid  by derm in the past ,.   ? what to use . no itching or burning.  Preventive Screening-Counseling & Management  Alcohol-Tobacco     Alcohol drinks/day: 1     Alcohol type: wine     Smoking Status: never     Year Quit: age 75  Caffeine-Diet-Exercise     Caffeine use/day: 2     Does Patient Exercise: no  Current Medications (verified): 1)  Atenolol-Chlorthalidone 50-25 Mg  Tabs (Atenolol-Chlorthalidone) .Marland Kitchen.. 1 By Mouth Once Daily 2)  Vesicare 10 Mg  Tabs (Solifenacin Succinate) 3)  Multi-Vitamin   Tabs (Multiple Vitamin) 4)  Bl Vitamin C 500 Mg  Tabs (Ascorbic Acid) 5)  Vitamin D 1000 Unit  Tabs (Cholecalciferol) 6)  Triamcinolone Acetonide 0.1 % Crea (Triamcinolone Acetonide) .... Used Twice Daily 7)  Donepezil Hcl 10 Mg Tabs (Donepezil Hcl) .... ? Dose One A Day  Per Dr Sandria Manly 8)  Fluticasone Propionate 0.05 % Crea (Fluticasone  Propionate) .... Use As Directed  Allergies (verified): No Known Drug Allergies  Past History:  Past medical, surgical, family and social histories (including risk factors) reviewed, and no changes noted (except as noted below).  Past Medical History: Reviewed history from 12/28/2009 and no changes required. Hypertension Osteoarthritis Urinary incontinence PMR G4P3 ? Mild MCI  Past Surgical History: Reviewed history from 12/07/2008 and no changes required. Total knee replacement x 2 Kidney Stone Excision Cataract extraction  Lipoma removal  Past History:  Care Management:  Rheumatology:Dr Zieminski Neurology: Vonita Moss Gastroenterology: Randa Evens Dermatology: Margo Aye  Family History: Reviewed history from 12/07/2008 and no changes required. see old chart fa cva 60 mom 101 melanoma Family History Osteoporosis   Social History: Reviewed history from 03/08/2010 and no changes required. see old paper record chart widowed tobacco x4 yrs when teen ocassional alcohol     Independent .  adl   Review of Systems  The patient denies anorexia, fever, weight loss, weight gain, chest pain, syncope, abnormal bleeding, enlarged lymph nodes, and angioedema.         memory still problematic  no falling    someone down at times  many friends with cancer  or dying.  itcy at back of neck and scalp .   Physical Exam  General:  Well-developed,well-nourished,in no acute distress; alert,appropriate and cooperative throughout examination Skin:  left le with 2.3 cm avulsion wound with erythema 3-4 cm  and chronic changes.   no absess or  streaking    base of neck and scalp with redness and flaking  no ulcers or nits  Psych:  Oriented X3, good eye contact, not anxious appearing, and subdued.  Oriented X3, good eye contact, not anxious appearing, and subdued.     Impression & Recommendations:  Problem # 1:  UNSPEC LOCAL INFECTION SKIN&SUBCUTANEOUS TISSUE (ICD-686.9) appears to  have  been an avulsion skin injury over shin area and having a hard toime healing and now with some infection .  has some chronic changes over the area also no streaking or abscess.   will do  local care and antibioitc and follow up   will take a long time to heal  dont use the cortisone directly on wound  but can use in other areas.   Problem # 2:  POLYMYALGIA RHEUMATICA (ICD-725) Assessment: Unchanged stable  The following medications were removed from the medication list:    Prednisone 5 Mg Tabs (Prednisone) .Marland Kitchen... 1/2 by mouth once daily  Problem # 3:  MILD COGNITIVE IMPAIRMENT SO STATED (ICD-331.83) Assessment: Unchanged now on meds    Problem # 4:  DERMATITIS (ICD-692.9)  The following medications were removed from the medication list:    Prednisone 5 Mg Tabs (Prednisone) .Marland Kitchen... 1/2 by mouth once daily Her updated medication list for this problem includes:    Triamcinolone Acetonide 0.1 % Crea (Triamcinolone acetonide) ..... Used twice daily    Fluticasone Propionate 0.05 % Crea (Fluticasone propionate) ..... Use as directed  Complete Medication List: 1)  Atenolol-chlorthalidone 50-25 Mg Tabs (Atenolol-chlorthalidone) .Marland Kitchen.. 1 by mouth once daily 2)  Vesicare 10 Mg Tabs (Solifenacin succinate) 3)  Multi-vitamin Tabs (Multiple vitamin) 4)  Bl Vitamin C 500 Mg Tabs (Ascorbic acid) 5)  Vitamin D 1000 Unit Tabs (Cholecalciferol) 6)  Triamcinolone Acetonide 0.1 % Crea (Triamcinolone acetonide) .... Used twice daily 7)  Donepezil Hcl 10 Mg Tabs (Donepezil hcl) .... ? dose one a day  per dr love 8)  Fluticasone Propionate 0.05 % Crea (Fluticasone propionate) .... Use as directed 9)  Ciprofloxacin Hcl 500 Mg Tabs (Ciprofloxacin hcl) .Marland Kitchen.. 1 by mouth two times a day  for skin infection  Patient Instructions: 1)  take antibiotic  2)  use polysporin two times a day to the wound and cover with nonstick dressing  3)  clean gently with clean saline of water. 4)  Expect improvement in the next week.  Marland Kitchen 5)  can use the coritosne cream on the itchy area of neck  and other dry itchy skin but not on the wound  at present.  6)  have the nurse keep checking the wound.  on thursday am   and call about how it is doing.  and then plan follow up .  7)  OV in 2 weeks or as needed.  Prescriptions: CIPROFLOXACIN HCL 500 MG TABS (CIPROFLOXACIN HCL) 1 by mouth two times a day  for skin infection  #20 x 0   Entered and Authorized by:   Madelin Headings MD   Signed by:   Madelin Headings MD on 05/10/2010   Method used:   Electronically to        [id number] ([facility]ail[facility]  [address]       [place], [place]  [address]       Ph: [phone] or [phone]       Fax: [phone]   RxID:   [id number]

## 2010-09-29 NOTE — Assessment & Plan Note (Signed)
Summary: pt will come in fasting/ok per instuction sheet/njrq   Vital Signs:  Patient profile:   77 year old female Menstrual status:  postmenopausal Height:      64.25 inches Weight:      169 pounds BMI:     28.89 Pulse rate:   53 / minute BP sitting:   120 / 80  (right arm) Cuff size:   regular  Vitals Entered By: Romualdo Bolk, CMA (AAMA) (March 08, 2010 8:35 AM) CC: Annual Visit for Disease Management- Discuss doing a pap   History of Present Illness: Tricia Mendez comes in today  with daughter today.   for PV issues and also  concerns about her memory.   Hard to describe things and oranize throughts. Dr has seen her for this Dr Sandria Manly.   on Aricept.   for a year.   unsure if helping. PMR: on low dose prednisone.  doing ok  HT:  nl  no problem with meds.   Medicare AWV:  1.   Risk factors based on Past M, S, F history: see above  updated   no hx of pap abn and getting them  all along     last one about 8 year ago and no.sx  2.   Physical Activities: nl  motor  no falls 3.   Depression/mood:   stressed with  many losses but not depressed  4.   Hearing: ok  5.   ADL's: independent   6.   Fall Risk:   no falling  or balance issues  7.   Home Safety:   discussed  8.   Height, weight, &visual acuity: good  vision 9.   Counseling:  done regarding to above  10.   Labs ordered based on risk factors:  see labs  11.           Referral Coordination 12.           Care Plan 13.            Cognitive Assessment   under care Dr Sandria Manly and  following  some memory retreieval issues .  Mmotor and attention  nl .  although ? if attention problem when younger some calculation slowness but  not proegressive .      Preventive Care Screening  Pap Smear:    Date:  11/12/2006    Results:  normal    Contraindications/Deferment of Procedures/Staging:    Test/Procedure: Pneumovax vaccine    Reason for deferment: patient declined     Test/Procedure: PAP Smear    Reason for deferment: not  indicated   Preventive Screening-Counseling & Management  Alcohol-Tobacco     Alcohol drinks/day: 1     Alcohol type: wine     Smoking Status: never     Year Quit: age 54  Caffeine-Diet-Exercise     Caffeine use/day: 2     Does Patient Exercise: no  Hep-HIV-STD-Contraception     Dental Visit-last 6 months yes     Sun Exposure-Excessive: no  Safety-Violence-Falls     Seat Belt Use: yes     Firearms in the Home: no firearms in the home     Smoke Detectors: yes     Fall Risk: none   Current Medications (verified): 1)  Atenolol-Chlorthalidone 50-25 Mg  Tabs (Atenolol-Chlorthalidone) .Marland Kitchen.. 1 By Mouth Once Daily 2)  Vesicare 10 Mg  Tabs (Solifenacin Succinate) 3)  Multi-Vitamin   Tabs (Multiple Vitamin) 4)  Calcium 600 1500 Mg  Tabs (Calcium Carbonate) 5)  Bl Vitamin C 500 Mg  Tabs (Ascorbic Acid) 6)  Prednisone 5 Mg Tabs (Prednisone) .... 1/2 By Mouth Once Daily 7)  Vitamin D 1000 Unit  Tabs (Cholecalciferol) 8)  Triamcinolone Acetonide 0.1 % Crea (Triamcinolone Acetonide) .... Used Twice Daily  Allergies (verified): No Known Drug Allergies  Past History:  Past medical, surgical, family and social histories (including risk factors) reviewed, and no changes noted (except as noted below).  Past Medical History: Reviewed history from 12/28/2009 and no changes required. Hypertension Osteoarthritis Urinary incontinence PMR G4P3 ? Mild MCI  Past Surgical History: Reviewed history from 12/07/2008 and no changes required. Total knee replacement x 2 Kidney Stone Excision Cataract extraction  Lipoma removal  Past History:  Care Management:  Rheumatology:Dr Zieminski Neurology: Vonita Moss Gastroenterology: Randa Evens Dermatology: Margo Aye  Family History: Reviewed history from 12/07/2008 and no changes required. see old chart fa cva 58 mom 101 melanoma Family History Osteoporosis   Social History: Reviewed history from 09/14/2009 and no changes required. see old  paper record chart widowed tobacco x4 yrs when teen ocassional alcohol     Independent .  adl Seat Belt Use:  yes Sun Exposure-Excessive:  no Fall Risk:  none  Dental Care w/in 6 mos.:  yes  Review of Systems  The patient denies anorexia, fever, weight loss, weight gain, vision loss, decreased hearing, hoarseness, chest pain, syncope, dyspnea on exertion, peripheral edema, prolonged cough, headaches, hemoptysis, abdominal pain, melena, hematochezia, severe indigestion/heartburn, hematuria, muscle weakness, transient blindness, difficulty walking, unusual weight change, abnormal bleeding, enlarged lymph nodes, and angioedema.         bruises lef easily over shins  no where else  no easy bleeding  to see dermatologist this week toes feel numb somettimes but no weakness or falling  Physical Exam  General:  Well-developed,well-nourished,in no acute distress; alert,appropriate and cooperative throughout examination Head:  Normocephalic and atraumatic without obvious abnormalities. No apparent alopecia or balding. Eyes:  vision grossly intact and pupils equal.  eoms nl glasses Ears:  R ear normal, L ear normal, and no external deformities.   Nose:  no external deformity, no external erythema, and no nasal discharge.   Mouth:  pharynx pink and moist.  tongue midline Neck:  No deformities, masses, or tenderness noted. Breasts:  No mass, nodules, thickening, tenderness, bulging, retraction, inflamation, nipple discharge or skin changes noted.   Lungs:  Normal respiratory effort, chest expands symmetrically. Lungs are clear to auscultation, no crackles or wheezes.no dullness.   Heart:  Normal rate and regular rhythm. S1 and S2 normal without gallop, murmur, click, rub or other extra sounds.no lifts.   Abdomen:  Bowel sounds positive,abdomen soft and non-tender without masses, organomegaly or hernias noted. Rectal:  No external abnormalities noted. Normal sphincter tone. No rectal masses or  tenderness. Genitalia:  Pelvic Exam:        External: normal female genitalia without lesions or masses        Vagina: normal without lesions or masses        Cervix: normal without lesions or masses        Adnexa: normal bimanual exam without masses or fullness        Uterus: normal by palpation        Pap smear: not performed Msk:  no joint warmth and no redness over joints.  oa changes  Pulses:  pulses intact without delay   Extremities:  no clubbing cyanosis or edema  Neurologic:  alert & oriented  X3, strength normal in all extremities, gait normal, and DTRs symmetrical and normal.   Skin:  turgor normal and color normal.  slight senile pupura on arms  some areas on pretibial area  Cervical Nodes:  No lymphadenopathy noted Axillary Nodes:  No palpable lymphadenopathy Inguinal Nodes:  No significant adenopathy Psych:  Oriented X3, normally interactive, good eye contact, not depressed appearing, and slightly anxious.    EKG NASR no acute changes.   Impression & Recommendations:  Problem # 1:  PREVENTIVE HEALTH CARE (ICD-V70.0)  Discussed nutrition,exercise,diet,healthy weight, vitamin D and calcium.   injury prevention. cognitions bereavement and stress  Orders: First annual wellness visit with prevention plan  [phone])  Problem # 2:  ? of MILD COGNITIVE IMPAIRMENT SO STATED (ICD-331.83) problematic   and rec    follow up with Dr Sandria Manly   .  unsure what to make of the feeling of numb feet. balance is good. counseled . pt  to sign release for [facility] to be able to give info  to daughters. Orders: TLB-CBC Platelet - w/Differential (85025-CBCD) TLB-TSH (Thyroid Stimulating Hormone) (84443-TSH) TLB-Lipid Panel (80061-LIPID) TLB-B12 + Folate Pnl (57846_96295-M84/XLK) TLB-BMP (Basic Metabolic Panel-BMET) (80048-METABOL) TLB-Hepatic/Liver Function Pnl (80076-HEPATIC) Venipuncture (44010)  Problem # 3:  POLYMYALGIA RHEUMATICA (ICD-725) Assessment: Unchanged  Her updated medication list  for this problem includes:    Prednisone 5 Mg Tabs (Prednisone) .Marland Kitchen... 1/2 by mouth once daily  Orders: TLB-CBC Platelet - w/Differential (85025-CBCD) TLB-Sedimentation Rate (ESR) (85652-ESR) T-Vitamin D (25-Hydroxy) (27253-66440) Venipuncture (34742)  Problem # 4:  PURPURA NOS SENILE (ICD-287.2) Assessment: Comment Only to see derm to check out the other skin areas   Problem # 5:  HYPERTENSION (ICD-401.9)  Her updated medication list for this problem includes:    Atenolol-chlorthalidone 50-25 Mg Tabs (Atenolol-chlorthalidone) .Marland Kitchen... 1 by mouth once daily  BP today: 120/80 Prior BP: 110/70 (12/28/2009)  Labs Reviewed: K+: 3.7 (12/07/2008) Creat: : 0.8 (12/07/2008)     Orders: EKG w/ Interpretation (93000)  Complete Medication List: 1)  Atenolol-chlorthalidone 50-25 Mg Tabs (Atenolol-chlorthalidone) .Marland Kitchen.. 1 by mouth once daily 2)  Vesicare 10 Mg Tabs (Solifenacin succinate) 3)  Multi-vitamin Tabs (Multiple vitamin) 4)  Calcium 600 1500 Mg Tabs (Calcium carbonate) 5)  Bl Vitamin C 500 Mg Tabs (Ascorbic acid) 6)  Prednisone 5 Mg Tabs (Prednisone) .... 1/2 by mouth once daily 7)  Vitamin D 1000 Unit Tabs (Cholecalciferol) 8)  Triamcinolone Acetonide 0.1 % Crea (Triamcinolone acetonide) .... Used twice daily 9)  Donepezil Hcl 10 Mg Tabs (Donepezil hcl) .... ? dose one a day  per dr love  Patient Instructions: 1)  Dexa scan      scheduled and then plan follow up depending on results  2)  You will be informed of lab results when available.  3)  follow up with Dr Sandria Manly about your memory concerns and     concerns about  feet numb.  4)  your exam and ekg are good.    Eye Exam  Last eye exam normal 05/28/2009- unsure of name. Romualdo Bolk, CMA (AAMA)  March 08, 2010 8:52 AM

## 2010-09-29 NOTE — Assessment & Plan Note (Signed)
Summary: ?shingles/njr   Vital Signs:  Patient profile:   77 year old female Menstrual status:  postmenopausal Weight:      168 pounds Temp:     97.5 degrees F oral BP sitting:   130 / 78  (right arm) Cuff size:   regular  Vitals Entered By: Duard Brady LPN (November 02, 1608 9:16 AM) CC: c/o ? shingles on back - saw derm. 5 days ago for rash on legs - while there mold removed from back - now with 'rash- bumps' on back , travelling to [place] today Is Patient Diabetic? No   CC:  c/o ? shingles on back - saw derm. 5 days ago for rash on legs - while there mold removed from back - now with 'rash- bumps' on back  and travelling to [place] today.  History of Present Illness: 77 year old patient who is seen with a complaint of a rash involving her left posterior back area.  She has recently been treated by dermatology for a mole removal.  She has been placing Polysporin ointment on the area and has developed a erythematous papular pruritic rash.  She is concerned about shingles.  She has treated hypertension, which has been stable.  Preventive Screening-Counseling & Management  Alcohol-Tobacco     Smoking Status: never  Allergies (verified): No Known Drug Allergies  Social History: Smoking Status:  never  Physical Exam  General:  Well-developed,well-nourished,in no acute distress; alert,appropriate and cooperative throughout examination Skin:  small ulceration, left the posterior mid back area from recent resection of a mole.  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 1/2 by mouth once daily    Triamcinolone Acetonide 0.1 % Crea (Triamcinolone acetonide) ..... Used twice daily  Orders: Prescription Created Electronically [id number])  Problem # 2:  HYPERTENSION  (ICD-401.9)  Her updated medication list for this problem includes:    Atenolol-chlorthalidone 50-25 Mg Tabs (Atenolol-chlorthalidone) .Marland Kitchen... 1 by mouth once daily  Complete Medication List: 1)  Atenolol-chlorthalidone 50-25 Mg Tabs (Atenolol-chlorthalidone) .Marland Kitchen.. 1 by mouth once daily 2)  Vesicare 10 Mg Tabs (Solifenacin succinate) 3)  Multi-vitamin Tabs (Multiple vitamin) 4)  Calcium 600 1500 Mg Tabs (Calcium carbonate) 5)  Senokot 8.6 Mg Tabs (Sennosides) 6)  Bl Vitamin C 500 Mg Tabs (Ascorbic acid) 7)  Prednisone 5 Mg Tabs (Prednisone) .... 1/2 by mouth once daily 8)  Vitamin D 1000 Unit Tabs (Cholecalciferol) 9)  Triamcinolone Acetonide 0.1 % Crea (Triamcinolone acetonide) .... Used twice daily  Patient Instructions: 1)  Limit your Sodium (Salt). 2)  Take calcium +Vitamin D daily. 3)  apply  triamcinolone cream twice daily Prescriptions: TRIAMCINOLONE ACETONIDE 0.1 % CREA (TRIAMCINOLONE ACETONIDE) used twice daily  #30 gm x 0   Entered and Authorized by:   Gordy Savers  MD   Signed by:   Gordy Savers  MD on 11/02/2009   Method used:   Print then Give to Patient   RxID:   [id number] TRIAMCINOLONE ACETONIDE 0.1 % CREA (TRIAMCINOLONE ACETONIDE) used twice daily  #30 gm x 0   Entered and Authorized by:   Gordy Savers  MD   Signed by:   Gordy Savers  MD on 11/02/2009   Method used:   Electronically to        [id number]       [address]       [place], [place]  [address]       Ph: [phone] or [phone]       Fax: [phone]   RxID:   [id number]

## 2010-09-29 NOTE — Progress Notes (Signed)
Summary: ? cellulitis?  Phone Note Call from Patient   Caller: Nurse at [facility] Call For: Madelin Headings MD Summary of Call: Blaine Hamper nurse at [facility].  States pt has a skin tear that is not healing.....red, hot and looks like cellulitis. Wants RX called in.  [phone] Initial call taken by: Lynann Beaver CMA,  May 09, 2010 11:20 AM  Follow-up for Phone Call        ? fever    usually dont treat over the phone    Rec OV   but can  sometimes begin med  pending appt.  Need more infor about the  description of the area  Follow-up by: Madelin Headings MD,  May 09, 2010 1:22 PM  Additional Follow-up for Phone Call Additional follow up Details #1::        LMTCB  Will schedule appt.  Scheduled appt tomorrow.  Unable to reach a nurse. Additional Follow-up by: Lynann Beaver CMA,  May 09, 2010 4:55 PM

## 2010-09-29 NOTE — Assessment & Plan Note (Signed)
Summary: ROA/FUP/AS PER DR/RCD   Vital Signs:  Patient profile:   77 year old female Menstrual status:  postmenopausal Weight:      173 pounds Temp:     98.2 degrees F oral Pulse rate:   60 / minute BP sitting:   130 / 80  (left arm) Cuff size:   regular  Vitals Entered By: Romualdo Bolk, CMA (AAMA) (July 19, 2010 9:57 AM) CC: Follow-up visit on leg- Looks better than it did. Pt wants left leg checked as well. Pt states that there is a knot underneath the skin and is still sore to the touch.   History of Present Illness: Tricia Mendez   comes in today  for follow up or infected abrasion.   sice lsat visit nose of meds ( augmentin and doxy  and  doing better.  Still concerned about itching area right thight.   No fever  or new illness  Preventive Screening-Counseling & Management  Alcohol-Tobacco     Alcohol drinks/day: 1     Alcohol type: wine     Smoking Status: never     Year Quit: age 34  Caffeine-Diet-Exercise     Caffeine use/day: 2     Does Patient Exercise: no  Current Medications (verified): 1)  Atenolol-Chlorthalidone 50-25 Mg  Tabs (Atenolol-Chlorthalidone) .Marland Kitchen.. 1 By Mouth Once Daily 2)  Vesicare 10 Mg  Tabs (Solifenacin Succinate) 3)  Multi-Vitamin   Tabs (Multiple Vitamin) 4)  Bl Vitamin C 500 Mg  Tabs (Ascorbic Acid) 5)  Vitamin D 1000 Unit  Tabs (Cholecalciferol) 6)  Triamcinolone Acetonide 0.1 % Crea (Triamcinolone Acetonide) .... Used Twice Daily 7)  Donepezil Hcl 10 Mg Tabs (Donepezil Hcl) .... ? Dose One A Day  Per Dr Sandria Manly 8)  Fluticasone Propionate 0.05 % Crea (Fluticasone Propionate) .... Use As Directed 9)  Doxycycline Hyclate 100 Mg Tabs (Doxycycline Hyclate) .Marland Kitchen.. 1 By Mouth Two Times A Day 10)  Augmentin 875-125 Mg Tabs (Amoxicillin-Pot Clavulanate) .Marland Kitchen.. 1 By Mouth Two Times A Day  Allergies (verified): No Known Drug Allergies  Past History:  Past medical, surgical, family and social histories (including risk factors) reviewed for  relevance to current acute and chronic problems.  Past Medical History: Reviewed history from 12/28/2009 and no changes required. Hypertension Osteoarthritis Urinary incontinence PMR G4P3 ? Mild MCI  Past Surgical History: Reviewed history from 12/07/2008 and no changes required. Total knee replacement x 2 Kidney Stone Excision Cataract extraction  Lipoma removal  Past History:  Care Management:  Rheumatology:Dr Zieminski Neurology: Vonita Moss Gastroenterology: Randa Evens Dermatology: Margo Aye  Family History: Reviewed history from 12/07/2008 and no changes required. see old chart fa cva 73 mom 101 melanoma Family History Osteoporosis   Social History: Reviewed history from 03/08/2010 and no changes required. see old paper record chart widowed tobacco x4 yrs when teen ocassional alcohol     Independent .  adl   Review of Systems  The patient denies anorexia, fever, dyspnea on exertion, prolonged cough, abdominal pain, difficulty walking, enlarged lymph nodes, and angioedema.    Physical Exam  General:  Well-developed,well-nourished,in no acute distress; alert,appropriate and cooperative throughout examination Head:  normocephalic and atraumatic.   Skin:  decreased redness around wound   and no swelling or dishaarge .  upper thigh with ry skin and excoriated papules . Psych:  Oriented X3, good eye contact, not anxious appearing, and not depressed appearing.     Impression & Recommendations:  Problem # 1:  UNSPEC LOCAL INFECTION SKIN&SUBCUTANEOUS TISSUE (ICD-686.9)  much improved     Problem # 2:  XEROSIS (ICD-706.8) right thigh   pciking at this making it worse?     Problem # 3:  MILD COGNITIVE IMPAIRMENT SO STATED (ICD-331.83) Assessment: Comment Only  Complete Medication List: 1)  Atenolol-chlorthalidone 50-25 Mg Tabs (Atenolol-chlorthalidone) .Marland Kitchen.. 1 by mouth once daily 2)  Vesicare 10 Mg Tabs (Solifenacin succinate) 3)  Multi-vitamin Tabs (Multiple  vitamin) 4)  Bl Vitamin C 500 Mg Tabs (Ascorbic acid) 5)  Vitamin D 1000 Unit Tabs (Cholecalciferol) 6)  Triamcinolone Acetonide 0.1 % Crea (Triamcinolone acetonide) .... Used twice daily 7)  Donepezil Hcl 10 Mg Tabs (Donepezil hcl) .... ? dose one a day  per dr love 8)  Fluticasone Propionate 0.05 % Crea (Fluticasone propionate) .... Use as directed 9)  Doxycycline Hyclate 100 Mg Tabs (Doxycycline hyclate) .Marland Kitchen.. 1 by mouth two times a day 10)  Augmentin 875-125 Mg Tabs (Amoxicillin-pot clavulanate) .Marland Kitchen.. 1 by mouth two times a day  Patient Instructions: 1)  finish  antibiotic for your leg infection . IT looks a lot better. 2)  Keep covered.  Do not pick at  areas on thigh . if persistent or  progressive  consider seeing dermatology . 3)  can use moisturizer and hydrocortisone on the upper thigh areas  that are itchy and dry.   Orders Added: 1)  Est. Patient Level II [16109]

## 2010-09-29 NOTE — Assessment & Plan Note (Signed)
Summary: possible UTI/cb   Vital Signs:  Patient profile:   77 year old female Menstrual status:  postmenopausal Weight:      170 pounds Temp:     98.2 degrees F oral Pulse rate:   60 / minute BP sitting:   110 / 70  (left arm) Cuff size:   regular  Vitals Entered By: Romualdo Bolk, CMA (AAMA) (Dec 28, 2009 9:49 AM) CC: Pt states that she did have some burning upon urination on 5/2 but is not having any symptoms now. Pt is having some urinary frequency and no control.   History of Present Illness: Tricia Mendez  walks in to clinic today for above .   She was dizzy yetserday with some increased in urinary frequncy and other symptom  and her daughter felt she could have a uti. She drank a lot of fluids  and  she is some beter today. NO fever or flank pain. IS going out of town.  No change in  meds . She thinks her memory is still getting worse.    BP has been ok and no CP sob.  No change in vision or falls and no cough.  Preventive Screening-Counseling & Management  Alcohol-Tobacco     Alcohol drinks/day: 1     Alcohol type: wine     Smoking Status: never     Year Quit: age 71  Caffeine-Diet-Exercise     Caffeine use/day: 2     Does Patient Exercise: no  Current Medications (verified): 1)  Atenolol-Chlorthalidone 50-25 Mg  Tabs (Atenolol-Chlorthalidone) .Marland Kitchen.. 1 By Mouth Once Daily 2)  Vesicare 10 Mg  Tabs (Solifenacin Succinate) 3)  Multi-Vitamin   Tabs (Multiple Vitamin) 4)  Calcium 600 1500 Mg  Tabs (Calcium Carbonate) 5)  Senokot 8.6 Mg  Tabs (Sennosides) 6)  Bl Vitamin C 500 Mg  Tabs (Ascorbic Acid) 7)  Prednisone 5 Mg Tabs (Prednisone) .... 1/2 By Mouth Once Daily 8)  Vitamin D 1000 Unit  Tabs (Cholecalciferol) 9)  Triamcinolone Acetonide 0.1 % Crea (Triamcinolone Acetonide) .... Used Twice Daily  Allergies (verified): No Known Drug Allergies  Past History:  Past medical, surgical, family and social histories (including risk factors) reviewed, and no changes  noted (except as noted below).  Past Medical History: Hypertension Osteoarthritis Urinary incontinence PMR G4P3 ? Mild MCI  Past Surgical History: Reviewed history from 12/07/2008 and no changes required. Total knee replacement x 2 Kidney Stone Excision Cataract extraction  Lipoma removal  Past History:  Care Management:  Rheumatology:Dr Zieminski Neurology: Vonita Moss Gastroenterology: Randa Evens  Family History: Reviewed history from 12/07/2008 and no changes required. see old chart fa cva 78 mom 101 melanoma Family History Osteoporosis   Social History: Reviewed history from 09/14/2009 and no changes required. see old paper record chart widowed tobacco x4 yrs when teen ocassional alcohol    helping with friends estate  resolution  Review of Systems  The patient denies anorexia, fever, weight loss, weight gain, vision loss, hoarseness, chest pain, syncope, dyspnea on exertion, peripheral edema, prolonged cough, melena, hematochezia, hematuria, transient blindness, difficulty walking, unusual weight change, enlarged lymph nodes, and angioedema.         coloration or lower legs persist no new edema and no pain . doesnt like the way it looks  Physical Exam  General:  Well-developed,well-nourished,in no acute distress; alert,appropriate and cooperative throughout examination Head:  normocephalic and atraumatic.   Eyes:  vision grossly intact, pupils equal, and pupils round.   Neck:  No deformities, masses, or tenderness noted. Lungs:  Normal respiratory effort, chest expands symmetrically. Lungs are clear to auscultation, no crackles or wheezes. Heart:  Normal rate and regular rhythm. S1 and S2 normal without gallop, murmur, click, rub or other extra sounds.no lifts.   Abdomen:  Bowel sounds positive,abdomen soft and non-tender without masses, organomegaly or hernias noted. no cva tenderness Msk:  no joint warmth and no redness over joints.   Pulses:  pulses intact  without delay   Extremities:  trace left pedal edema and trace right pedal edema. multiple VV   Neurologic:  alert & oriented X3, strength normal in all extremities, and gait normal.  Some decrease in memory not formally tested  Skin:  pigmented areas on shins with indistrinct borders   ? post inflammatory  Cervical Nodes:  No lymphadenopathy noted Psych:  Oriented X3 and good eye contact.  some problesm with  memeory but nl converstation .   Impression & Recommendations:  Problem # 1:  UTI (ICD-599.0)  Her updated medication list for this problem includes:    Vesicare 10 Mg Tabs (Solifenacin succinate)    Ciprofloxacin Hcl 250 Mg Tabs (Ciprofloxacin hcl) .Marland Kitchen... 1 by mouth two times a day  Orders: Prescription Created Electronically [phone])  Problem # 2:  DIZZINESS (ICD-780.4) better today poss from  UTI  no acute findings stoday  Problem # 3:  ? of MILD COGNITIVE IMPAIRMENT SO STATED (ICD-331.83) somewhat worse   poss from UTi  but functionla  will disc more at rov.   Problem # 4:  VARICOSE VEINS, LOWER EXTREMITIES (ICD-454.9) with pigment chages poss from previous infection and edema  will watch    consider derm to see.   Complete Medication List: 1)  Atenolol-chlorthalidone 50-25 Mg Tabs (Atenolol-chlorthalidone) .Marland Kitchen.. 1 by mouth once daily 2)  Vesicare 10 Mg Tabs (Solifenacin succinate) 3)  Multi-vitamin Tabs (Multiple vitamin) 4)  Calcium 600 1500 Mg Tabs (Calcium carbonate) 5)  Senokot 8.6 Mg Tabs (Sennosides) 6)  Bl Vitamin C 500 Mg Tabs (Ascorbic acid) 7)  Prednisone 5 Mg Tabs (Prednisone) .... 1/2 by mouth once daily 8)  Vitamin D 1000 Unit Tabs (Cholecalciferol) 9)  Triamcinolone Acetonide 0.1 % Crea (Triamcinolone acetonide) .... Used twice daily 10)  Ciprofloxacin Hcl 250 Mg Tabs (Ciprofloxacin hcl) .Marland Kitchen.. 1 by mouth two times a day  Patient Instructions: 1)  I think you have a urine infection 2)  Take the antibioitc and If not better at the end of this call for  advice. 3)  You will be informed of  culture results when available.  4)  schedule yearly   med and visit   before July and will do labs at that time. Prescriptions: CIPROFLOXACIN HCL 250 MG TABS (CIPROFLOXACIN HCL) 1 by mouth two times a day  #10 x 0   Entered and Authorized by:   Madelin Headings MD   Signed by:   Madelin Headings MD on 12/28/2009   Method used:   Electronically to        [id number] ([facility]       [address]       [place], [place]  [address]       Ph: [phone] or [phone]       Fax: [phone]   RxID:   [id number]   Laboratory Results   Urine Tests    Routine Urinalysis   Color: yellow Appearance: Clear Glucose: negative   (Normal Range: Negative) Bilirubin: negative   (  Normal Range: Negative) Ketone: negative   (Normal Range: Negative) Spec. Gravity: 1.015   (Normal Range: 1.003-1.035) Blood: negative   (Normal Range: Negative) pH: 6.0   (Normal Range: 5.0-8.0) Protein: negative   (Normal Range: Negative) Urobilinogen: 0.2   (Normal Range: 0-1) Nitrite: positive   (Normal Range: Negative) Leukocyte Esterace: 1+   (Normal Range: Negative)    Comments: Rita Ohara  Dec 28, 2009 9:43 AM

## 2010-10-13 ENCOUNTER — Encounter: Payer: Self-pay | Admitting: Internal Medicine

## 2010-10-13 ENCOUNTER — Ambulatory Visit (INDEPENDENT_AMBULATORY_CARE_PROVIDER_SITE_OTHER): Payer: Medicare Other | Admitting: Internal Medicine

## 2010-10-13 VITALS — BP 138/72 | HR 71 | Temp 98.2°F | Wt 174.0 lb

## 2010-10-13 DIAGNOSIS — G3184 Mild cognitive impairment, so stated: Secondary | ICD-10-CM

## 2010-10-13 DIAGNOSIS — L989 Disorder of the skin and subcutaneous tissue, unspecified: Secondary | ICD-10-CM

## 2010-10-13 DIAGNOSIS — B029 Zoster without complications: Secondary | ICD-10-CM | POA: Insufficient documentation

## 2010-10-13 DIAGNOSIS — M353 Polymyalgia rheumatica: Secondary | ICD-10-CM

## 2010-10-13 DIAGNOSIS — I8390 Asymptomatic varicose veins of unspecified lower extremity: Secondary | ICD-10-CM

## 2010-10-13 MED ORDER — VALACYCLOVIR HCL 1 G PO TABS: 1000.0000 mg | ORAL_TABLET | Freq: Three times a day (TID) | ORAL | Status: AC

## 2010-10-13 NOTE — Assessment & Plan Note (Signed)
Could be contributing to le issues  With skin and healing.

## 2010-10-13 NOTE — Assessment & Plan Note (Signed)
Ongoing    Disconcerting to her but ok  Wasn't to take a trip soon.

## 2010-10-13 NOTE — Assessment & Plan Note (Signed)
Apparently no relapse.

## 2010-10-13 NOTE — Progress Notes (Signed)
  Subjective:    Patient ID: Tricia Mendez, female    DOB: Dec 08, 1923, 77 y.o.   MRN: [id number]  HPI  patient comes in today it is an acute visit because of a rash on her left abdomen that has been there almost a week. She's had some problems with her skin recently including multiple skin cancers treated and biopsied. She is due to see her dermatologist Dr. Margo Aye next week. However a new rashes, in the past weeks with some itching and burning not a whole lot of pain but wonders if this is shingles. It is located on her left lower chest upper abdomen.  Her daughter sees him is concerned about her rashes on during the visit talked briefly with her on the phone also.   In regard to her cognitive impairment she doesn't think she's doing now well but is holding her. She can remember her medications in organizational skills but it is frustrating for her she continues on medication. She would like to go on a cruise. She is trying to get better enough to do this. No falls her other change in her medical conditions.  Past Medical History  Diagnosis Date  . Cellulitis of left leg   . Eczema   . Hypertension   . Mild cognitive impairment   . Osteoarthritis   . Polymyalgia rheumatica   . Varicose veins   . Xerosis of skin   . Urinary incontinence    Past Surgical History  Procedure Date  . Joint replacement   . Kidney stone surgey   . Cataract surgery   . Lipoma removal     reports that she quit smoking about 65 years ago. She quit smokeless tobacco use about 65 years ago. Her alcohol and drug histories not on file. family history includes Melanoma in her mother; Osteoporosis in her other; and Stroke in her father.     Review of Systems  negative for fever chest pain new joint pain she has a diagnosis of PMR but is not on steroids at present. No new GI or GU symptoms. Has varicose veins in her legs but no new swelling. There are some sores on her legs that aren't healing well.    Objective:     Physical Exam  well-developed well-nourished lady in no acute distress who appears his stated age is ambulatory with good motor skills. She is oriented x3 nontoxic. Skin shows left anterior lower chest upper abdomen with lines of papular vesicular lesions a red without crusting or ulcers. Otherwise there are a few scattered papules throughout the body some reddened areas on her arms and legs that were treated for skin cancer couple pustules on her chest N. Date healing ulcer on her left anterior shin. I cannot tell if this is the biopsy site or a new abrasion it didn't get better. There is +1 edema of her leg no weeping.   No acute joint swelling.  Respirations unlabored pulse regular rhythm.  Oriented to time person place normal speech.       Assessment & Plan:   see problem list. More than 50% of visit  Was spent in counseling   And disc with family member   25 minutes .  Have Dr Margo Aye send [place] a copy of his rec also to  [employer] communication.

## 2010-10-13 NOTE — Patient Instructions (Signed)
This  does seem like shingles Take the antiviral medication as discussed to prevent long term pain and problems.   Have dermatology look at the other skin areas   ? If need an antibiotic to help these heal.

## 2010-10-13 NOTE — Assessment & Plan Note (Signed)
multiple skin lesions some healing from bx? and some look like papules  / if any infection   These look different that the  New rash. To see derm next week.

## 2010-10-17 ENCOUNTER — Telehealth: Payer: Self-pay | Admitting: Internal Medicine

## 2010-10-17 NOTE — Telephone Encounter (Signed)
Pt called and said that she is needing to get a lotion or something to help with the itching shingles. [facility]

## 2010-10-17 NOTE — Telephone Encounter (Signed)
Pt is calling back concerning itching shingles

## 2010-10-17 NOTE — Telephone Encounter (Signed)
Spoke to pt and she was seen for Shingles. She has taken the medication. She is itching all over. I told pt to try OTC Benadryl. Pt is going to get the cream to see if that helps.

## 2010-10-17 NOTE — Telephone Encounter (Signed)
Spoke to pt and told pt to get hydrocortisone cream. She has not seen Dr. Margo Aye yet but will call to make an appt.

## 2010-11-01 ENCOUNTER — Other Ambulatory Visit: Payer: Self-pay | Admitting: Internal Medicine

## 2010-12-05 LAB — CBC
HCT: 45.4 % (ref 36.0–46.0)
Hemoglobin: 16.1 g/dL — ABNORMAL HIGH (ref 12.0–15.0)
Platelets: 256 10*3/uL (ref 150–400)
Platelets: 260 10*3/uL (ref 150–400)
RDW: 12.3 % (ref 11.5–15.5)
WBC: 8.6 10*3/uL (ref 4.0–10.5)

## 2010-12-05 LAB — SEDIMENTATION RATE: Sed Rate: 2 mm/hr (ref 0–22)

## 2010-12-05 LAB — URINE MICROSCOPIC-ADD ON

## 2010-12-05 LAB — URINALYSIS, ROUTINE W REFLEX MICROSCOPIC
Protein, ur: NEGATIVE mg/dL
Urobilinogen, UA: 0.2 mg/dL (ref 0.0–1.0)

## 2010-12-05 LAB — CK TOTAL AND CKMB (NOT AT ARMC)
CK, MB: 3.6 ng/mL (ref 0.3–4.0)
Relative Index: INVALID (ref 0.0–2.5)
Total CK: 71 U/L (ref 7–177)
Total CK: 98 U/L (ref 7–177)

## 2010-12-05 LAB — DIFFERENTIAL
Basophils Absolute: 0.1 10*3/uL (ref 0.0–0.1)
Basophils Absolute: 0.1 10*3/uL (ref 0.0–0.1)
Eosinophils Relative: 3 % (ref 0–5)
Lymphocytes Relative: 36 % (ref 12–46)
Lymphocytes Relative: 41 % (ref 12–46)
Lymphs Abs: 3.1 10*3/uL (ref 0.7–4.0)
Lymphs Abs: 3.2 10*3/uL (ref 0.7–4.0)
Monocytes Absolute: 0.6 10*3/uL (ref 0.1–1.0)
Monocytes Absolute: 0.8 10*3/uL (ref 0.1–1.0)
Neutro Abs: 3.8 10*3/uL (ref 1.7–7.7)
Neutro Abs: 4.4 10*3/uL (ref 1.7–7.7)

## 2010-12-05 LAB — COMPREHENSIVE METABOLIC PANEL
ALT: 22 U/L (ref 0–35)
AST: 27 U/L (ref 0–37)
CO2: 28 mEq/L (ref 19–32)
Calcium: 9.5 mg/dL (ref 8.4–10.5)
Creatinine, Ser: 0.68 mg/dL (ref 0.4–1.2)
GFR calc Af Amer: >60 mL/min (ref >60)
GFR calc non Af Amer: >60 mL/min (ref >60)
Sodium: 139 mEq/L (ref 135–145)
Total Protein: 6.5 g/dL (ref 6.0–8.3)

## 2010-12-05 LAB — HEPATIC FUNCTION PANEL
ALT: 20 U/L (ref 0–35)
AST: 31 U/L (ref 0–37)
Alkaline Phosphatase: 82 U/L (ref 39–117)
Bilirubin, Direct: <0.1 mg/dL (ref 0.0–0.3)
Total Protein: 6.8 g/dL (ref 6.0–8.3)

## 2010-12-05 LAB — C-REACTIVE PROTEIN: CRP: 0.5 mg/dL — ABNORMAL LOW (ref <0.6)

## 2010-12-13 ENCOUNTER — Other Ambulatory Visit: Payer: Self-pay | Admitting: *Deleted

## 2010-12-13 MED ORDER — LIDOCAINE 5 % EX OINT: TOPICAL_OINTMENT | CUTANEOUS | Status: DC | PRN

## 2010-12-13 NOTE — Telephone Encounter (Signed)
Per Dr. Fabian Sharp ok x 1 with 2 tubes. rx faxed to pharmacy.

## 2010-12-13 NOTE — Telephone Encounter (Signed)
Refill on lidocaine 5% oint AAA to tid. Pt wants 2 tubes.

## 2010-12-29 ENCOUNTER — Other Ambulatory Visit: Payer: Self-pay | Admitting: Internal Medicine

## 2010-12-29 NOTE — Telephone Encounter (Signed)
rx sent to pharmacy. Per Dr. Fabian Sharp- ok x2

## 2011-01-10 NOTE — H&P (Signed)
Tricia Mendez, SANE NO.:  [id number]   MEDICAL RECORD NO.:  [id number]          PATIENT TYPE:  EMS   LOCATION:  ED                           FACILITY:  [facility]   PHYSICIAN:  Renee Ramus, MD       DATE OF BIRTH:  1923/09/15   DATE OF ADMISSION:  02/07/2009  DATE OF DISCHARGE:                              HISTORY & PHYSICAL   PRIMARY CARE PHYSICIAN:  [name], MD   HISTORY OF PRESENT ILLNESS:  The patient is an 77 year old female with  epigastric pain.  The patient reports pain in the left upper  epigastrium, predominantly in the left upper outer quadrant of her  abdomen.  The patient says the pain increases with inspiration,  decreases with rest.  She says it is burning and lasts for 4-5 minutes  intermittently.  She denies any other exacerbating or relieving factors.  She denies previous history of this pain.  She says the pain is more or  less resolved currently.  The patient really was concerned about this  being her heart, but denies any associated nausea, vomiting,  diaphoresis, chest pain, shortness of breath, PND or orthopnea.  We were  asked to see the patient to evaluate for possible etiologies.   PAST MEDICAL HISTORY:  1. Hyperlipidemia.  2. Urinary incontinence.  3. Osteoarthritis.  4. Status post total knee arthroplasty.  5. Mild dementia.  6. PMR.   SOCIAL HISTORY:  The patient denies tobacco use, has rare alcohol use.  She is retired.  She is currently widowed.   FAMILY HISTORY:  Not available.   REVIEW OF SYSTEMS:  All other comprehensive review of systems are  negative.   ALLERGIES:  THE PATIENT HAS NO KNOWN DRUG ALLERGIES.   CURRENT MEDICATIONS:  Prednisone, atenolol, Vesicare and Aricept.  Dosages and frequencies or  not available.   PHYSICAL EXAMINATION:  GENERAL:  This is a well-developed, well-  nourished white female currently in no apparent distress.  VITAL SIGNS:  Blood pressure 115/42, heart rate 59, respiratory rate  20, temperature  97.  HEENT:  No jugulovenous distention or lymphadenopathy.  Oropharynx is  clear.  Mucous membranes pink and moist.  TMs clear bilaterally.  Pupils  equal and reactive to light and accommodation.  Extraocular muscles are  intact.  CARDIOVASCULAR:  Regular rate and rhythm without murmurs, rubs or  gallops.  PULMONARY:  Lungs are clear to auscultation bilaterally.  ABDOMEN:  Soft, nontender, nondistended without hepatosplenomegaly.  I  cannot reproduce her pain by palpation.  She has no rebound.  She has no  guarding.  EXTREMITIES:  She has no clubbing, cyanosis or edema.  She has good  peripheral pulses in dorsalis pedis and radial arteries.  She is able to  move all extremities.  NEURO:  Cranial nerves II-XII are grossly intact.  She has no focal  neurological deficits.   LABORATORY DATA:  Negative troponin.  White count 8.6, hemoglobin and  hematocrit 16 and 47, MCV 100, platelets 260.  Sodium 145, potassium  3.7, chloride 100, bicarb 30, BUN 19, creatinine 0.8, glucose  93, lipase  of 29.   ASSESSMENT/PLAN:  1. Abdominal pain.  This is noncardiac.  I do not believe this      represents acute coronary syndrome given the constellation of      symptoms, negative labs and negative EKG.  I will check a      urinalysis, but believe that this does not represent an acute      infectious etiology, likely the patient has pleuritis.  I advised      her treat with ibuprofen and follow-up with her primary care      physician as needed.  2. Incontinence, currently stable.  3. Dementia, this is stable.  4. Hyperlipidemia.  The patient will continue statin therapy.  5. Hypertension, currently well controlled.   DISPOSITION:  The patient will be discharged to home.   PRIMARY DISCHARGE DIAGNOSIS:  Pleuritis.   SECONDARY DIAGNOSES:  1. Incontinence  2. Dementia.  3. Hyperlipidemia.  4. Hypertension.  5. Polymyalgia rheumatica.   H and P and discharge summary were  constructed by reviewing the past  medical history, conferring with the emergency medical room physician  and reviewing the emergency medical record.  Time spent 1 hour.      Renee Ramus, MD  Electronically Signed     JF/MEDQ  D:  02/08/2009  T:  02/08/2009  Job:  [id number]   cc:   [name]. Fabian Sharp, MD  [address] [place], [place] [address]

## 2011-01-13 ENCOUNTER — Ambulatory Visit (INDEPENDENT_AMBULATORY_CARE_PROVIDER_SITE_OTHER): Payer: Medicare Other | Admitting: Internal Medicine

## 2011-01-13 ENCOUNTER — Telehealth: Payer: Self-pay | Admitting: Internal Medicine

## 2011-01-13 ENCOUNTER — Encounter: Payer: Self-pay | Admitting: Internal Medicine

## 2011-01-13 VITALS — BP 140/70 | HR 90 | Wt 172.0 lb

## 2011-01-13 DIAGNOSIS — G3184 Mild cognitive impairment, so stated: Secondary | ICD-10-CM

## 2011-01-13 DIAGNOSIS — M353 Polymyalgia rheumatica: Secondary | ICD-10-CM

## 2011-01-13 DIAGNOSIS — L259 Unspecified contact dermatitis, unspecified cause: Secondary | ICD-10-CM

## 2011-01-13 DIAGNOSIS — I10 Essential (primary) hypertension: Secondary | ICD-10-CM

## 2011-01-13 MED ORDER — ZOSTER VACCINE LIVE 19400 UNT/0.65ML ~~LOC~~ SOLR: 0.6500 mL | Freq: Once | SUBCUTANEOUS | Status: AC

## 2011-01-13 NOTE — Discharge Summary (Signed)
NAMECHRISTYNA, LETENDRE                            ACCOUNT NO.:  [id number]   MEDICAL RECORD NO.:  [id number]                   PATIENT TYPE:  INP   LOCATION:  0473                                 FACILITY:  [facility]   PHYSICIAN:  Ollen Gross, M.D.                 DATE OF BIRTH:  09/12/1923   DATE OF ADMISSION:  09/08/2002  DATE OF DISCHARGE:  09/12/2002                                 DISCHARGE SUMMARY   ADMITTING DIAGNOSES:  1. Osteoarthritis left knee.  2. Osteoarthritis right knee.  3. Hypertension.  4. History of urinary tract infection.  5. Mild history of urinary incontinence.   DISCHARGE DIAGNOSES:  1. Osteoarthritis left knee status post left total knee replacement     arthroplasty.  2. Postoperative hypokalemia, improved.  3. Osteoarthritis right knee.  4. Hypertension.  5. History of urinary tract infection.  6. Mild history of urinary incontinence.   PROCEDURE:  The patient was taken to the OR on September 08, 2002 and  underwent a left total knee replacement arthroplasty.  Surgeon Ollen Gross, M.D.  Assistant Alexzandrew L. Julien Girt, P.A.  Surgery under general  anesthesia.  Estimated blood loss minimal.  Hemovac drain x1.  Tourniquet  time 50 minutes at 300 mmHg.   CONSULTS:  Rehabilitation services, Ranelle Oyster, M.D.   BRIEF HISTORY:  The patient is a 78 year old female who has been seen in  consultation by Ollen Gross, M.D. for ongoing bilateral knee pain.  The  left is more symptomatic than the right.  She has reached a point where she  would like to have something done about her knees.  This started to  interfere with her daily activities and her activities of daily living.  X-  rays in the office reveal end-stage arthritis of both knees slightly worse  on the left as compared to the right.  She has reached a point where she  could benefit from undergoing total knee replacement.  Risks and benefits  discussed and she elected to proceed with  surgery.   LABORATORY DATA:  CBC on admission:  Hemoglobin 15.1, hematocrit 43.7, white  cell count 6.9, red cell count 5.0.  Serial H&Hs followed.  Last noted H&H  12.5 and 35.4.  Differential on the admission CBC all within normal limits.  PT/PTT on admission were 13.1 and 35, respectively with an INR of 0.9.  Serial pro times followed per Coumadin protocol.  Last noted PT/INR 17.1 and  INR 1.5.  Chemistry panel on admission:  Higher level of calcium at 10.9.  Remaining chemistry panel all within normal limits.  Serial BMETs were  followed.  Potassium did drop from 4.2 down to 3.7, again down to 2.8.  Was  back up to 3.8.  Remaining electrolytes within normal limits.  Glucose  elevated from 84 to 156, back down to 128.  Urinalysis on admission  negative.  Postoperative  UA check did show some small hemoglobin with large  leukocyte esterase and many epithelial cells, 11-20 white cells, 7-10 red  cells, few bacteria.  Urine culture taken.  Multiple species, likely  contaminant.   EKG dated September 02, 2002:  Sinus bradycardia, otherwise normal confirmed by  Colleen Can. Deborah Chalk, M.D.  Preoperative chest x-ray dated September 02, 2002:  No active disease, mild thoracic spondylosis and scoliosis.   HOSPITAL COURSE:  The patient was admitted to [facility] and taken  to OR.  Underwent above stated procedure without complications.  The patient  tolerated procedure well.  Was allowed to return to recovery room and then  to the orthopedic floor for continued postoperative care.  Vital signs were  followed.  Hemovac drain placed at time of surgery was pulled on  postoperative day one.  PT and OT were consulted to assist with gait  training ambulation and ADLs for total knee protocol.  The patient underwent  a rehabilitation consult and was seen in consultation by Ranelle Oyster,  M.D. and felt she would benefit from inpatient rehabilitation and waited for  a bed to open.  She was noted to  have a drop in potassium down to 2.8.  She  was placed on potassium supplements.  Potassium did come back up at 3.8.  She did have some slight postoperative temperature on postoperative day one  and two of 100.2 and 101.2.  Temperature came back down to 99 by  postoperative day three.  Incentive spirometer and antipyretics were  encouraged.  Dressing change initiated postoperative day two.  Incision was  healing well.  No signs of infection.  The patient progressed fairly well  with physical therapy initially starting out slow.  She ambulated  approximately 24 feet by postoperative day two.  She continued to progress  and got up to 150 feet by postoperative day three.  She did very well with  physical therapy.  Felt that she would not require inpatient rehabilitation  and patient was felt that she could go home with home health.  She continued  to be followed and on September 12, 2002 patient was doing quite well.  She  was up ambulating greater than 150 feet, but doing well.  Arrangements had  been made for home health and it was decided patient would be discharged  home at that time.   DISCHARGE PLAN:  The patient discharged home on September 12, 2002.   DISCHARGE DIAGNOSES:  Please see above.   DISCHARGE MEDICATIONS:  1. Cipro 500 mg one p.o. b.i.d. for five days.  2. Coumadin as per pharmacy protocol.  3. Percocet for pain.  4. Robaxin for spasm.   DIET:  Low sodium diet.   ACTIVITY:  Weightbearing as tolerated to left lower extremity.  Home health  PT, home health nursing through [facility].  Weightbearing as  tolerated.  Total knee protocol.   FOLLOW UP:  Two weeks from surgery.   DISPOSITION:  Home.   CONDITION ON DISCHARGE:  Improved.     Alexzandrew L. Julien Girt, P.A.              Ollen Gross, M.D.    ALP/MEDQ  D:  10/13/2002  T:  10/13/2002  Job:  [id number]

## 2011-01-13 NOTE — Telephone Encounter (Signed)
Opened in error.  Pt coming in for ov.

## 2011-01-13 NOTE — H&P (Signed)
NAMELETOYA, STALLONE                    ACCOUNT NO.:  [id number]   MEDICAL RECORD NO.:  [id number]                   PATIENT TYPE:  INP   LOCATION:  0472                                 FACILITY:  [facility]   PHYSICIAN:  Ollen Gross, M.D.                 DATE OF BIRTH:  [birth year]   DATE OF ADMISSION:  11/09/2003  DATE OF DISCHARGE:                                HISTORY & PHYSICAL   CHIEF COMPLAINT:  Right knee pain.   HISTORY OF PRESENT ILLNESS:  Ms. Goers is a 77 year old female well known  to Dr. Lequita Halt who has previously undergone a left total knee replacement.  Her right knee unfortunately  has had continued end-stage arthritis with  intractable pain. She has done well with her left pain and her right knee  has been progressively getting worse to the point where she would like to  have something done about. It is felt she would be appropriate candidate for  replacement. Risks and benefits were discussed. The patient was subsequently  admitted for surgery.   ALLERGIES:  No known drug allergies.   CURRENT MEDICATIONS:  1. Atenolol one half tablet daily.  2. Baby aspirin daily.  3. Multivitamin daily.  4. Calcium supplement b.i.d.  5. Tylenol Arthritis.   PAST MEDICAL HISTORY:  1. Hypertension.  2. Occasional urinary tract infections.  3. Urinary incontinence.  4. Renal calculi.  5. Osteoarthritis.   PAST SURGICAL HISTORY:  1. Kidney stone excision in 1952.  2. Lipoma excision.  3. Cataract surgery.  4. Recently total knee arthroplasty.   FAMILY HISTORY:  Father deceased age 84 with history of stroke. Mother with  history of breast cancer. Grandfather with history of heart disease.  Grandmother with history of hypertension and ovarian cancer.   SOCIAL HISTORY:  Widowed. Works as a Architectural technologist. Past history of  smoking, stopped at age 71. Two glasses of alcohol, wine, on a daily basis.  Has three children. Family and friends will be assisting with  her care  following surgery. Lives independent living on the 4th floor at a friend's  home with elevator service available.   REVIEW OF SYSTEMS:  GENERAL: No fever, chills, nightsweats. NEUROLOGIC: No  seizure, syncope, or paralysis. RESPIRATORY:  No shortness of breath,  productive cough, or hemoptysis. CARDIOVASCULAR: No chest pain, angina, or  orthopnea. GI: No nausea, vomiting, diarrhea, or constipation. GU:  Occasional urinary tract infections with slight urinary incontinence. No  dysuria, hematuria, or discharge. MUSCULOSKELETAL: Pertinent to that of the  right knee found in the history of present illness.   PHYSICAL EXAMINATION:  VITAL SIGNS: Pulse 68, respirations 12, blood  pressure 148/82.  GENERAL: A 77 year old female, well-nourished, well-developed, in no acute  distress, accompanied by daughter.  HEENT:  Normocephalic and atraumatic. Pupils are round and reactive.  Oropharynx is clear. Upper dentures noted.  NECK: Supple. No carotid bruits.  CHEST: Clear to auscultation in the anterior and  posterior chest wall. No  rales, rhonchi, or wheezes.  HEART: Regular rate and rhythm.  No murmurs. S1 and S2 noted.  ABDOMEN: Soft, nontender. Bowel sounds are present.  RECTAL/BREASTS/GENITALIA: Not done; not pertinent to the present illness.  EXTREMITIES: Significant to the right knee, range of motion shows lacking 5  degrees of full extension with flexion up to 115 degrees, no instability,  moderate crepitus, no effusion.   IMPRESSION:  1. Osteoarthritis, right knee.  2. Status post left total knee replacement arthroplasty.  3. Hypertension.  4. History of  urinary tract infection.  5. History of mild urinary incontinence.  6. History of renal calculi.   PLAN:  The patient will be admitted to [facility] to undergo right  total knee arthroplasty. Surgery will be performed by Dr. Ollen Gross.     Alexzandrew L. Julien Girt, P.A.              Ollen Gross, M.D.     ALP/MEDQ  D:  11/10/2003  T:  11/10/2003  Job:  [id number]   cc:   Lenon Curt Chilton Si, M.D.  [address]  [place]  [place] [address]  Fax: [phone]   Ollen Gross, M.D.  [facility]  [address]  [address]  [place]  [place] [address]  Fax: [phone]

## 2011-01-13 NOTE — Op Note (Signed)
NAME:  GENEVIVE, PRINTUP                    ACCOUNT NO.:  [id number]   MEDICAL RECORD NO.:  [id number]                   PATIENT TYPE:  INP   LOCATION:  0002                                 FACILITY:  [facility]   PHYSICIAN:  Ollen Gross, M.D.                 DATE OF BIRTH:  05-11-1924   DATE OF PROCEDURE:  11/09/2003  DATE OF DISCHARGE:                                 OPERATIVE REPORT   PREOPERATIVE DIAGNOSIS:  Osteoarthritis, right knee.   POSTOPERATIVE DIAGNOSIS:  Osteoarthritis, right knee.   PROCEDURE:  Right total knee arthroplasty.   SURGEON:  Gus Rankin. Aluisio, M.D.   ASSISTANT:  Avel Peace, P.A.   ANESTHESIA:  General with postop Marcaine pain pump.   ESTIMATED BLOOD LOSS:  Minimal.   DRAIN:  Hemovac x 1.   TOURNIQUET TIME:  39 minutes at 300 mmHg.   COMPLICATIONS:  None.   CONDITION:  Stable to recovery.   BRIEF CLINICAL NOTE:  Ms. Whitcomb is a 77 year old female with severe end-  stage osteoarthritis of the right knee, tricompartmental in nature.  She has  failed nonoperative management, including injections.  She has had a  previous very successful left total knee.  She presents now for right total  knee arthroplasty.   PROCEDURE IN DETAIL:  After the successful administration of general  anesthetic, a tourniquet is placed high on the right thigh and right lower  extremity prepped and draped in the usual sterile fashion.  Extremity is  wrapped in Esmarch, knee flexed, then tourniquet inflated to 300 mmHg.  A  standard midline incision is made with a 10 blade through the subcutaneous  tissue to the level of the extensor mechanism.  A fresh blade is used to  make a medial parapatellar arthrotomy, then the soft tissue over the  proximal and medial tibia is subperiosteally elevated to the joint line with  a knife and into the semimembranosus bursa with a curved osteotome.  Soft  tissue over the proximal and lateral tibia is also elevated with attention  being  paid to avoiding the patella tendon on the tibial tubercle.  The  patella is everted, knee flexed 90 degrees and ACL and PCL are removed.  Drill is used to create a starting hole in the distal femur, and the canal  is irrigated.  The 5-degree right valgus alignment guide is placed and  referencing off the posterior condyles, rotation is marked and a block  pinned to remove 10 mm off the distal femur.  Distal femoral resection is  made with an oscillating saw.  A sizing block is placed, and a size 3 is  most appropriate.  The size 3 cutting block is placed with the rotation  referencing off the epicondylar axis.  Anterior and posterior cuts are then  made.   The tibia is then subluxed forward, and the menisci are removed.  The  extramedullary tibial alignment guide is placed, referencing proximally  at  the medial aspect of the tibial tubercle and distally along the second  metatarsal axis and tibial crest.  The block is pinned to remove 10 mm off  the less deficient lateral side.  Tibial resection is made with an  oscillating saw.  The sizing guide is placed for the tibia, and size 3 is  the most appropriate.  The proximal tibia is then prepared with the modular  drill and keel punch.  Femoral preparation is completed with the  intercondylar and chamfer cuts.   The trial size 3 posterior stabilized femur is sized through mobile bearing  tibial trial with a 10 mm posterior stabilized rotating platform insert is  placed.  Full extension is achieved with excellent varus and valgus balance  throughout full range of motion.  The patella is then everted, thickness  measured to be 21 mm, free hand resection taken to 11 mm.  A 38 template is  placed, lug holes are drilled, trial patella is placed, and it tracks  normally.  The osteophytes are then removed off the posterior femur with the  trial in place.  The cut bone surfaces are then prepared with pulsatile  lavage and the cement mixed.  Once  ready for implantation, the size 3 mobile  bearing tibial tray, size 3 posterior stabilized femur, and 38 patella are  cemented into place, and patella is held with a clamp.  The 10 mm trial  insert is placed and knee held in full extension and all extruded cement  removed.  Once the cement is fully hardened, then the permanent 10 mm  posterior stabilized rotating platform insert is placed into the tibial  tray.  The wound is then copiously irrigated with antibiotic solution and  extensor mechanism closed over a Hemovac drain with interrupted #1 PDS.  Flexion against gravity is 135 degrees.  Tourniquet is released for a total  tourniquet time of 39 minutes.  Subcu is closed with interrupted 2-0 Vicryl  and subcuticular with running 4-0 Monocryl.  The catheter for the pain pump  is then placed.  The pump is initiated.  The Hemovac is hooked to suction.  Steri-Strips and a bulky sterile dressing are applied, and the patient  awakened and transported to recovery in stable condition.                                               Ollen Gross, M.D.    FA/MEDQ  D:  11/09/2003  T:  11/09/2003  Job:  [id number]

## 2011-01-13 NOTE — H&P (Signed)
[name], Tricia Mendez                    ACCOUNT NO.:  [id number]   MEDICAL RECORD NO.:  [id number]                   PATIENT TYPE:  INP   LOCATION:  0472                                 FACILITY:  [facility]   PHYSICIAN:  Ollen Gross, M.D.                 DATE OF BIRTH:  04/28/1924   DATE OF ADMISSION:  11/09/2003  DATE OF DISCHARGE:                                HISTORY & PHYSICAL   CHIEF COMPLAINT:  Right knee pain.   HISTORY OF PRESENT ILLNESS:  The patient is a 77 year old female known to  Dr. Homero Fellers Aluisio.    Dictation ended at this point.     Alexzandrew L. Julien Girt, P.A.              Ollen Gross, M.D.    ALP/MEDQ  D:  11/10/2003  T:  11/10/2003  Job:  [id number]   cc:   Lenon Curt. Chilton Si, M.D.  [address]  [place]  [place] [address]  Fax: [phone]   Ollen Gross, M.D.  [facility]  [address]  [address]  [place]  [place] [address]  Fax: [phone]

## 2011-01-13 NOTE — Op Note (Signed)
Tricia Mendez, Tricia Mendez                ACCOUNT NO.:  [id number]   MEDICAL RECORD NO.:  [id number]          PATIENT TYPE:  AMB   LOCATION:  ENDO                         FACILITY:  [facility]   PHYSICIAN:  James L. Malon Kindle., M.D.DATE OF BIRTH:  [birth year]   DATE OF PROCEDURE:  12/02/2004  DATE OF DISCHARGE:                                 OPERATIVE REPORT   PROCEDURE:  Colonoscopy.   MEDICATIONS GIVEN:  Fentanyl 70 mcg, Versed 7.5 mg IV.   SCOPE:  Olympus pediatric adjustable colonoscope.   INDICATIONS FOR PROCEDURE:  Colon cancer screening.   DESCRIPTION OF PROCEDURE:  The procedure was explained and patient consent  obtained.  With the patient in the left lateral decubitus position, the  Olympus scope was inserted.  The rectum was seen to be quite red in the  distal rectum with submucosal hemorrhages.  I was able to advance beyond  this and advance rapidly to the cecum.  The ileocecal valve and appendiceal  orifice was seen.  The scope was withdrawn through the cecum, ascending  colon, transverse colon, splenic flexure, descending and sigmoid colon, no  significant diverticular disease, no polyps or other lesions.  The scope was  withdrawn down to the rectum.  In the distal rectum, there was seen to be  submucosal hemorrhage.  This appeared to be fairly nonspecific type  proctitis probably due to rectal prolapse.  There were no ulcerations.  The  scope was withdrawn.  There were hemorrhoids.  The patient tolerated the  procedure well, there were no immediate problems.   ASSESSMENT:  1.  Nonspecific proctitis probably due to  mild rectal prolapse, 569.49.  2.  Internal hemorrhoids, 455.0.   PLAN:  Start the patient on Metamucil.  Will see back in the office in six  weeks.      JLE/MEDQ  D:  12/02/2004  T:  12/02/2004  Job:  [id number]   cc:   Lenon Curt. Chilton Si, M.D.  [address]  [place]  [place] [address]  Fax: [phone]

## 2011-01-13 NOTE — Progress Notes (Signed)
Subjective:    Patient ID: NATACIA CHAISSON, female    DOB: Jul 23, 1924, 77 y.o.   MRN: [id number]  HPI Patient comes in today for an acute patient visit for the above problems. She comes without a family member or others to speak for her today. She has been having bruising type looking areas on the shins of her lower legs for quite a while and it could be worse. She did see Dr. Margo Aye dermatology who said it wasn't cancer. She doesn't have a diagnosis. She also developed a rash on the top of her ankles and has had some blotchiness on both arms for quite a while. She is using some cream that we have given her for this.  She recently went to [place] was in the sun saw him but minimally and does not think her rash is related to medicines that she has been on or the sun   She wants to know what the diagnosis is and why she has this. She cannot tell me the name of the cream that she is using.  Her cognitive changes are more worrisome for her she is having a hard time with her memory and has right everything down. She is currently on Aricept. Has a question of why she is not on aspirin she thinks she was taken off of that a while back but doesn't remember why. She doesn't remember any history of significant bleeding while on it.  Her shingles is improved No redness near the area of rash.  Review of Systems  negative for chest pain shortness of breath falling major changes in her vision. Memory is worse no bruising or bleeding except for the above areas.    Objective:   Physical Exam Well-developed well-nourished in no acute distress gait is stable. Eye contact is good.Bp repeat on left 130/70  Reg cuff.  Neck supple  MS no acute joint swelling  Skin:  Arms are a bit rough and dry with blotchy hyperpigmented areas but no vesicles or scale  shins from upper third down show  dark in purplish brown splotches without significant edema. There is a top of the ankle outside of his shoes there is a  morbilliform type rash that is palpable she states it is not itchy there no vesicles Upper chest faded red splotchy rash in exposed area      Neuro  Conversant  Not formally tested but writes things down and cant remember details of her meds .    Record review in EPIC  Last labs July 2011 here   Assessment & Plan:  LE  Old rash  with looks like senile purpura vs other old pigmentation.    LE rash new  ? Sun related vs other contact ?  Unsure what is being used    Has seen Dr Margo Aye but I dont have the note and cant advise what he thought.   THus hard to answer her ?s   doesn not appear to have a blood disease but is due for routine lab monitoring.  Suggest go back to derm and or second opinion at [facility].   Disc at length options of eval. HT better readings after sitting.  No change in meds. Cognitive change becoming more problematic.  Hard to tell what the course of her treatment has  Been on her legs Will bring family member to next visit  Call in meantime and write things  down. Disc asa use   Risk benefit of medication discussed.  Total visit > 50% spent counseling and coordinating care

## 2011-01-13 NOTE — Op Note (Signed)
Tricia Mendez, Tricia Mendez                            ACCOUNT NO.:  [id number]   MEDICAL RECORD NO.:  [id number]                   PATIENT TYPE:  INP   LOCATION:  0004                                 FACILITY:  [facility]   PHYSICIAN:  Ollen Gross, M.D.                 DATE OF BIRTH:  May 31, 1924   DATE OF PROCEDURE:  09/08/2002  DATE OF DISCHARGE:                                 OPERATIVE REPORT   PREOPERATIVE DIAGNOSES:  Osteoarthritis, left knee.   POSTOPERATIVE DIAGNOSES:  Osteoarthritis, left knee.   PROCEDURE:  Left total knee arthroplasty.   SURGEON:  Ollen Gross, M.D.   ASSISTANT:  Alexzandrew L. Julien Girt, P.A.   ANESTHESIA:  General.   ESTIMATED BLOOD LOSS:  Minimal.   DRAINS:  Hemovac x1.   COMPLICATIONS:  None.   TOURNIQUET TIME:  50 minutes at 300 mmHg.   CONDITION:  Stable to recovery.   BRIEF CLINICAL NOTE:  Tricia Mendez is a 77 year old female with severe end-  stage osteoarthritis of the left knee with pain refractory to nonoperative  management. She presents now for left total knee arthroplasty.   DESCRIPTION OF PROCEDURE:  After successful administration of general  anesthetic, the tourniquet was placed high on the left thigh, left lower  extremity prepped and draped in the usual sterile fashion. Extremity was  wrapped in Esmarch, knee flexed, tourniquet inflated to 300 mmHg. A standard  midline incision was made with a 10 blade through the subcutaneous tissue to  the level of the extensor mechanism. A fresh blade was used to make a medial  parapatellar arthrotomy and then soft tissue over the proximal medial tibia  is subperiosteally elevated to the joint line with a knife and into the  semimembranosus bursa with a curved osteotome. The soft tissue over the  proximal lateral tissue is also elevated with attention being paid to  avoiding the patellar tendon on tibial tubercle. The lateral patellofemoral  ligament is cut, patella is everted, knee flexed to 90  degrees and the ACL  and PCL removed. A drill was used to create a starting hole in the distal  femur, canal was irrigated, 5 degree left valgus alignment guide placed.  Referencing off the posterior condyles, rotations marked and a block pinned  to remove 10 mm off the distal femur. Distal femoral resection was made with  an oscillating saw. A sizing block is placed and a size 3 is the most  appropriate. Rotation is marked off the epicondylar axis, AP block pinned  and the anterior and posterior cuts made.   The tibia is subluxed forward and the menisci removed. The extramedullary  tibial alignment guide is placed referencing proximally at the medial aspect  of the tibial tubercle and distally along the second metatarsal axis and  tibial crest. Block is pinned to remove 6 mm off the deficient medial side.  We did not use the  lateral side as the shape of her tibia actually had the  lateral side lower then the medial side. The block is pinned and then the  resection made with an oscillating saw. A size 3 mobile bearing trial is  placed. The intercondylar and chamfer cuts are then completed on the distal  femur. A size 3 posterior stabilized femoral component is placed. A 10 mm  posterior stabilized rotating platform insert trial is placed. The knee is  held in full extension with excellent varus and valgus balance throughout  the entire range of motion. The patella is everted, thickness measured to be  22, free hand resection taken to 12 mm, 38 template placed, lug holes  drilled, trial patella placed and tracks normally.   The proximal tibia was then prepared with the modular drill and keel punch.  The osteophytes were then removed off the posterior femur with the trial in  place. All trials were then removed and cut bone surfaces prepared with  pulsatile lavage. The cement is mixed and once ready for implantation a size  3 mobile bearing tibial tray, size 3 posterior stabilized femur and  38  patella are cement into place and patella is held with a clamp. A trial 10  mm insert is placed, knee held in full extension, and all extruded cement  removed. Once the cement was fully hardened then the permanent 10 mm  posterior stabilized rotating platform insert  is placed. Once again there  is excellent balance throughout, full range of motion. The wound is  copiously irrigated with antibiotic solution and the extensor mechanism  closed over a Hemovac drain with interrupted #1 Vicryl. The subcu was closed  with interrupted 2-0 Vicryl. Flexion again gravity was 135 degrees. The  tourniquet was released with a total time of 15 minutes and the subcuticular  layers closed with a running 4-0 Monocryl. The needle for the Marcaine pain  pump is then placed in the subcutaneous tissue, catheter is threaded through  the needle and needle removed and the pump started at the usual parameters.  The Steri-Strips were then placed over the incision, bulky sterile dressing  applied and she was placed into a knee immobilizer, awakened and transported  to recovery in stable condition.,                                               Ollen Gross, M.D.    FA/MEDQ  D:  09/08/2002  T:  09/08/2002  Job:  [id number]

## 2011-01-13 NOTE — H&P (Signed)
Tricia Mendez, Tricia Mendez                            ACCOUNT NO.:  [id number]   MEDICAL RECORD NO.:  [id number]                   PATIENT TYPE:  INP   LOCATION:  NA                                   FACILITY:  [facility]   PHYSICIAN:  Ollen Gross, M.D.                 DATE OF BIRTH:  1924/07/15   DATE OF ADMISSION:  09/08/2002  DATE OF DISCHARGE:                                HISTORY & PHYSICAL   CHIEF COMPLAINT:  Left knee pain.   HISTORY OF PRESENT ILLNESS:  The patient is a 77 year old female who has  been seen in consultation by Ollen Gross, M.D. for left knee pain.  She  has a long history of progressive bilateral knee pain.  Usually it is right  more than left.  However, for the past several months the left knee has been  hurting more.  It has reached a point where she would like to have something  done about it.  She was taking care of an ill husband who recently passed  away in March.  She realizes now that her knee is interfering with her daily  activities and she has difficulty performing her ADLs.  She is seen in the  office where x-rays revealed end-stage arthritis on both knees, slightly  worse on the left as compared to the right.  She is noted to have bone on  bone changes.  It is felt she has reached a point where she could benefit  from undergoing total knee replacement arthroplasty.  Risks and benefits of  this procedure have been discussed with the patient and she has elected to  proceed with surgery.  Will proceed with the left knee which is more  symptomatic at this time.   ALLERGIES:  No known drug allergies.   CURRENT MEDICATIONS:  1. Atenolol dose unknown.  She takes one-half tablet p.o. daily.  2. Baby aspirin daily.  Stop prior to surgery.  3. She is also on Celebrex.  Stop prior to surgery.  4. She also takes calcium 1200 mg daily and Centrum Silver vitamin.   PAST MEDICAL HISTORY:  1. Hypertension.  2. Occasional urinary tract infection.  3. Some slight  urinary incontinence.  4. History of renal calculi.  5. Osteoarthritis.   PAST SURGICAL HISTORY:  1. Kidney stone excision 1952.  2. Lipoma excision.  3. Also, recent cataract surgery.   SOCIAL HISTORY:  She was married for approximately 30 years.  She is  currently divorced.  She works as a Architectural technologist.  She has a past  history of smoking.  She stopped at age 52.  She has two glasses of alcohol  on a daily basis.  She has three children.  Some of her family will be  assisting with her care following surgery.  She lives in a three room  apartment independent living at [facility].  There are  elevators within the building.   FAMILY HISTORY:  Father deceased age 78 with a history of stroke.  Mother  with a history of breast cancer.  Grandfather with a history of heart  disease.  Grandmother with history of hypertension, ovarian cancer.   REVIEW OF SYSTEMS:  GENERAL:  No fevers, chills, night sweats.  NEUROLOGIC:  No seizures, syncope, paralysis.  RESPIRATORY:  No shortness of breath,  productive cough, or hemoptysis.  CARDIOVASCULAR:  No chest pain, angina,  orthopnea.  GASTROINTESTINAL:  No nausea, vomiting, diarrhea, constipation.  GENITOURINARY:  She has some occasional UTIs and severe urinary  incontinence.  She does have some nocturia.  No dysuria, hematuria, or  discharge.  MUSCULOSKELETAL:  Pertinent to that of the left knee found in  the history of present illness.   PHYSICAL EXAMINATION:  VITAL SIGNS:  Pulse 76, respirations 12, blood  pressure 120/70.  GENERAL:  The patient is a 77 year old female well-nourished, well-  developed, appears to be in no acute distress.  She is accompanied by one of  her friends.  HEENT:  Normocephalic, atraumatic.  Pupils round, reactive.  She is noted to  wear glasses.  EOMs are intact.  NECK:  Supple.  No carotid bruits are appreciated.  CHEST:  Clear to auscultation anterior/posterior chest walls.  No rhonchi,   rales, or wheezing.  HEART:  Regular rate and rhythm.  No murmur.  S1, S2 noted.  ABDOMEN:  Soft, nontender.  Bowel sounds are present.  No rebound or  guarding.  RECTAL:  Not done.  Not pertinent to present illness.  BREASTS:  Not done.  Not pertinent to present illness.  GENITALIA:  Not done.  Not pertinent to present illness.  EXTREMITIES:  Significant to that of the left lower extremity.  No effusions  appreciated.  She has range of motion of 5 to 150 degrees.  Moderate  crepitus noted on passive range of motion.  There is no instability.  Motor  function intact.   IMPRESSION:  1. Osteoarthritis left knee.  2. Osteoarthritis right knee.  3. Hypertension.  4. History of urinary tract infections.  5. Mild history of urinary incontinence.    PLAN:  The patient will be admitted to [facility] to undergo a  left total knee replacement arthroplasty.  Surgery will be performed by  Ollen Gross, M.D.     Alexzandrew L. Julien Girt, P.A.              Ollen Gross, M.D.    ALP/MEDQ  D:  09/03/2002  T:  09/03/2002  Job:  [id number]   cc:   Lenon Curt. Chilton Si, M.D.  [address]  [place]  [place] [address]  Fax: [phone]   Ollen Gross, M.D.  [address]  [place]  [place] [address]  Fax: [phone]

## 2011-01-13 NOTE — Assessment & Plan Note (Signed)
[facility] OFFICE NOTE   NAME:Tricia, Mendez                MRN:          [id number]  DATE:10/08/2006                            DOB:          07-01-1924    CHIEF COMPLAINT:  New patient to establish.   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 She is a resident of [facility] for the last  10 years and has previously been a patient of Dr. Neva Seat and she is  transferring care. She is generally well but has a longstanding  diagnosis of hypertension for which she has been on atenolol a half a  day and she can not give me the mg for a number of years and it seems to  be well controlled. Her previous physician before Dr. Neva Seat I believe  was Dr. Shonna Chock. She has also had a history of elevated  cholesterol but has been attacking that with diet and exercise in which  she has lost 40 pounds over the last 6 months on purpose with Weight  Watchers and appropriate activity as possible. She also has a history of  urinary incontinence for which she takes Vesicare unknown mg. She has a  history of osteoarthritis and has had a total knee replacement per Dr.  Despina Hick  but is doing quite well and able to walk on a regular basis  despite this difficulty. She did have blood work done in May of 2007 and  also November of 2007 and she brings a copy of this today. In general  the labs are good, however, her hemoglobin has dropped from a 15.5 to a  12. 0 with normal indices, I wonder if this is significant or could she  have some nutritional deficiency related to her weight loss. Her renal  function, liver function, are good. Her cholesterol in November showed a  217, HDL 40, LDL 137 direct. Her triglycerides were 108. She has never  had a DEXA scan, had a PAP smear a number of years ago and  has questions  about whether that should be done.   PAST MEDICAL HISTORY:   SURGICAL HISTORY:  A 1952 kidney stone excision, cataract surgery lipoma  removal, total knee replacement, left by Dr. Despina Hick March 2005,  colonoscopy in April 2006. She is gravida 4, para 3.   FAMILY HISTORY:  See data base, mom is 38 years old also in [facility] has recently been diagnosed with a melanoma and in declining in  health. Father died of CVA at age 61 in 57. Mom has osteoporosis and  is on thyroid medication, father may have had type 2 diabetes very late  onset. Maternal grandmother with ovarian cancer and 2 aunts with what  sounds like ovarian cancer.   SOCIAL HISTORY:  She is a retired Research officer, trade union, widowed times 4  years. Occasionally alcohol, no tobacco but did smoke 4 years when she  was age 32 to 24, some caffeine, does do regular exercise, 8 hours of  sleep a night, takes vitamins and calcium.   REVIEW OF SYSTEMS:  Negative for chest pain, shortness of breath,  significant GI/GU prox problems, except as above.   OBJECTIVE:  Height 5 feet 4 and a half inches, weight 165 pounds, pulse  60 and regular, blood pressure 110/70.  Is a well-developed, well-nourished, healthy appearing 77 year old in no  acute distress, who appears younger than her stated age.  HEENT: Grossly normal.  NECK: Supple without masses, thyromegaly, or bruits. There is a  prominent bony prominence on the right that she states has been there  for years.  CHEST: Clear to auscultation and equal.  CARDIAC: S1, S2, no gallops, or murmurs are heard. Peripheral pulses are  present without delay, negative __________ .  ABDOMEN: Soft, no thyromegaly, guarding, or rebound.  NEUROLOGIC: Appears grossly intact.  SKIN: Shows no bruising, bleeding, or petechia.   IMPRESSION:  1. Hypertension.  2. Urinary incontinence, history on treatment.  3. Drop in hemoglobin level but not yet to the anemic range, rule out       other underlying concern, or cause, and follow.  4. Osteoarthritis.  5. History of renal stones with no recurrences.   PLAN:  We will  check a DEXA scan as this has not been done for years,  we will  repeat her CBC and differential and get a ferritin B12 level,  have her optimize her calcium and vitamin D up to 800 units of D, and  ROV in 1 month or so after I get the results back. We discussed the  pelvic exam and PAP smear limitations but will come back and do a PAP  and pelvic at her next office visit to catch up on her health care  maintenance. And follow up on any other concerns at that time. She will  let us know the mg of her medicines at that time.     Tricia Mendez. Panosh, MD  Electronically Signed    WKP/MedQ  DD: 10/08/2006  DT: 10/09/2006  Job #: [id number]

## 2011-01-13 NOTE — Discharge Summary (Signed)
NAMEGENIE, WENKE                    ACCOUNT NO.:  [id number]   MEDICAL RECORD NO.:  [id number]                   PATIENT TYPE:  INP   LOCATION:  0472                                 FACILITY:  [facility]   PHYSICIAN:  Ollen Gross, M.D.                 DATE OF BIRTH:  [birth year]   DATE OF ADMISSION:  11/09/2003  DATE OF DISCHARGE:  11/13/2003                                 DISCHARGE SUMMARY   ADMITTING DIAGNOSES:  1. Osteoarthritis right knee.  2. Status post left total knee arthroplasty.  3. Hypertension.  4. Urinary tract infection.  5. Mild urinary incontinence.  6. History of renal calculi.   DISCHARGE DIAGNOSES:  1. Osteoarthritis right knee status post right total knee arthroplasty.  2. Postoperative hypokalemia, improved.  3. Postoperative hyponatremia, improved.  4. Status post left total knee arthroplasty.  5. Hypertension.  6. Urinary tract infection.  7. Mild urinary incontinence.  8. History of renal calculi.   PROCEDURE:  Date of surgery November 09, 2003:  Right total knee arthroplasty.  Surgeon:  Dr. Homero Fellers Aluisio.  Assistant:  Avel Peace, P.A.-C.  Anesthesia:  General with a postoperative Marcaine pain pump.  Minimal blood loss.  Hemovac drain x1.  Tourniquet time 39 minutes at 300 mmHg.   CONSULTS:  None.   BRIEF HISTORY:  Ms. Cumbo is a 77 year old female with severe end-stage  arthritis of the right knee.  She has failed nonoperative management  including injections and now presents for a total knee arthroplasty.   LABORATORY DATA:  CBC on admission:  Hemoglobin was 15.6, hematocrit of  44.4, white cell count 7.4, differential within normal limits.  Postoperative H&H 12.5 and 36.1.  Last noted H&H 11.4 and 33.3.  PT/PTT  preoperatively 13.1 and 37 respectively with INR 1.0.  Serial protimes  followed; last noted PT and INR 18.1 and 1.8.  Chem panel on admission all  within normal limits.  Serial BMETs are followed; had a drop in sodium from  137 to 131 back up to 135, potassium declined after surgery from 4.1 down to  2.8 back up to 3.0 and last noted 3.4.  Urinalysis preoperatively:  Moderate  leukocyte esterase, many epithelial cells, 11-20 white cells, rare bacteria.  She was treated preoperatively.  Postoperative UA checked:  Trace leukocyte  esterase, only 0-2 white cells, few bacteria.  Blood group and type O  negative.   EKG dated November 03, 2003:  Normal sinus rhythm, normal EKG.  Heart rate  increase since previous tracing.  Confirmed by Dr. Olga Millers.  Another  EKG dated September 02, 2002:  Sinus bradycardia; otherwise normal.  No  previous tracing.  Confirmed by Dr. Delfin Edis.  Chest x-ray dated November 03, 2003:  No acute cardiopulmonary findings.   HOSPITAL COURSE:  The patient admitted to [facility], taken to the  OR, underwent the above-stated procedure without complications.  The patient  tolerated the  procedure well, later transferred to the recovery room and  then to the orthopedic floor to continue postoperative care.  Vital signs  were followed.  The patient was given postoperative IV antibiotics in the  form of Ancef, Coumadin for 3 weeks, DVT prophylaxis, PT and OT were  consulted for postoperative care, weightbearing as tolerated, and started  back on home medications.  Hemovac drain placed at the time of surgery was  pulled on postoperative day #1.  She was noted to have some mild  hyponatremia and hypokalemia, started on potassium supplements and fluids  were discontinued.  Slight postoperative temperature on postoperative day #1  of 102.1.  It was felt to be atelectasis, treated with incentive spirometer  and antipyretics.  Temperature did come back down to normal by day #2.  Potassium was slow to resolve, given potassium supplements, potassium  responded well.  Sodium was back up to 135.  PCA was discontinued on day #2.  Dressing was changed.  Incision was healing well.  Did reiterate  the  importance of not having a pillow under the leg of the patient; she  understood this.  From a therapy standpoint she actually started to progress  very well.  By day #3 potassium was back up, low sodium had resolved.  She  was starting to increase her mobility with the therapy.  She was up  ambulating approximately 60 feet by day #2, 100 feet by day #3, and 150 feet  by day #4.  Low sodium and low potassium had resolved with treatment.  She  was tolerating her medications by day #4 and was ready to go home.   DISCHARGE MEDICATIONS AND PLAN:  1. The patient discharged home on November 13, 2003.  2. Discharge diagnoses:  Please see above.  3. Discharge medications:  Cipro, Coumadin, Percocet, Robaxin.  4. Diet:  Low sodium diet.  5. Follow-up 2 weeks.  6. Activity:  Weightbearing as tolerated.  Home health PT, home health     nursing.  Total knee protocol.   DISPOSITION:  Home.   CONDITION UPON DISCHARGE:  Improved.     Alexzandrew L. Julien Girt, P.A.              Ollen Gross, M.D.    ALP/MEDQ  D:  12/09/2003  T:  12/09/2003  Job:  [id number]   cc:   Lenon Curt. Chilton Si, M.D.  [address]  [place]  [place] [address]  Fax: [phone]

## 2011-01-13 NOTE — Patient Instructions (Addendum)
Ok to take low dose aspirin  81 mg if not bleeding.  I am not sure about    Why you have the rash but could be   Some type of sun sensititve rash.  Sometimes medications can do this   You are due for blood tests     Soon.  Will schedule for this and then OV.   See derm again.     and if not help consider asking  Second opinion     From [facility] dermatology.  BP repeat reading was 140/70 and 130/ 70  Have  Nurse check Blood pressure readings once a week  And if ok then monthly.

## 2011-02-20 ENCOUNTER — Telehealth: Payer: Self-pay | Admitting: Internal Medicine

## 2011-02-20 NOTE — Telephone Encounter (Signed)
Wants to be worked in on Wednesday. Having leg issues.

## 2011-02-20 NOTE — Telephone Encounter (Signed)
Spoke to pt- she states that she is not able to get out of bed in the am because she is afraid that she is going to fall. She states that her feet is unsteady. No dizziness. Pt states that she has bumped her legs and they have been bleeding. But this is under control.

## 2011-02-21 ENCOUNTER — Telehealth: Payer: Self-pay | Admitting: *Deleted

## 2011-02-21 NOTE — Telephone Encounter (Signed)
error 

## 2011-02-21 NOTE — Telephone Encounter (Signed)
Pt's daughter states pt has d/c her aricept for 1 month. She had ran out and didn't take it. She was feeling better and decided that she didn't need it.

## 2011-02-21 NOTE — Telephone Encounter (Signed)
Spoke to Tricia Mendez- She states she is not only having problems with balance but is having memory problems. I spoke Tricia Mendez and she has given [place] verbal permission to talk to her daughter about what is going on with her. Tricia Mendez states that it is more than just balance problems. She is going to have her daughter call us back about this.

## 2011-02-21 NOTE — Telephone Encounter (Signed)
Spoke to daughter- she has been referred to derm for her legs for bleeding under the surface. Her legs look terrible. It takes awhile for her legs to heal. She had dropped something on her foot that wasn't heavy but it hurts and looks bad because it's bruised. It is getting better. She is banging herself up and it looks bad. Her daughter is worried about it. Her derm has not look at any possible causes of this. They want someone to look and see if there is a better treatment for this.  She is also concerned about memory issues that makes her anxious. They also thinks she is depressed and would like something for this. They would like to get her in tomorrow or thurs.

## 2011-02-22 ENCOUNTER — Ambulatory Visit (INDEPENDENT_AMBULATORY_CARE_PROVIDER_SITE_OTHER): Payer: Medicare Other | Admitting: Internal Medicine

## 2011-02-22 ENCOUNTER — Encounter: Payer: Self-pay | Admitting: Internal Medicine

## 2011-02-22 VITALS — BP 120/80 | HR 72 | Temp 98.7°F

## 2011-02-22 DIAGNOSIS — F418 Other specified anxiety disorders: Secondary | ICD-10-CM | POA: Insufficient documentation

## 2011-02-22 DIAGNOSIS — F329 Major depressive disorder, single episode, unspecified: Secondary | ICD-10-CM

## 2011-02-22 DIAGNOSIS — M79609 Pain in unspecified limb: Secondary | ICD-10-CM

## 2011-02-22 DIAGNOSIS — L989 Disorder of the skin and subcutaneous tissue, unspecified: Secondary | ICD-10-CM

## 2011-02-22 DIAGNOSIS — M79671 Pain in right foot: Secondary | ICD-10-CM

## 2011-02-22 DIAGNOSIS — G3184 Mild cognitive impairment, so stated: Secondary | ICD-10-CM

## 2011-02-22 DIAGNOSIS — M353 Polymyalgia rheumatica: Secondary | ICD-10-CM

## 2011-02-22 DIAGNOSIS — L259 Unspecified contact dermatitis, unspecified cause: Secondary | ICD-10-CM

## 2011-02-22 MED ORDER — SERTRALINE HCL 50 MG PO TABS: ORAL_TABLET | ORAL | Status: DC

## 2011-02-22 MED ORDER — DONEPEZIL HCL 10 MG PO TABS: 5.0000 mg | ORAL_TABLET | Freq: Every day | ORAL | Status: DC

## 2011-02-22 MED ORDER — TRIAMCINOLONE ACETONIDE 0.1 % EX OINT: TOPICAL_OINTMENT | Freq: Two times a day (BID) | CUTANEOUS | Status: DC

## 2011-02-22 NOTE — Patient Instructions (Addendum)
Moisturizing  on legs and cortisone   On arms  For now  Will do a derm consult at [facility].   Opinion about  poss intervention and other disease cause... Will let you know  About foot x ray.  Get back on aricept.  5 mg per day aincrease to 10 mg  As tolerated  Begin low dose zoloft  And increase dose after 2 weeks.  ROV in 3-4 weeks or prn. Have someone help with pill box and meds  With these changes. Call dr Herma Carson office to check on the prednisone dosing and plan.

## 2011-02-22 NOTE — Progress Notes (Signed)
Subjective:    Patient ID: Tricia Mendez, female    DOB: June 15, 1924, 77 y.o.   MRN: [id number]  HPI Patient comes in today with her daughter Tricia Mendez  has a somewhat urgent request to be seen this week for multiple issues. Mr. reviewed regarding this. Her other daughter is Tricia Mendez.    Legs :  Has seen Dr. 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 She's been treated with topical steroids in the past unclear she's using these now. She is concerned because of recurrent color changes easy bruising that are quite severe. She doesn't remember major trauma worried about other diseases that can cause this problem wonders if she could have a blood disease. Usually small trauma causes major change in her leg color. This is discouraging to her.   A number of days ago she had a large box fall on her foot. She has a bruise on her left ankle that is not that tender but her right foot has hurt since that time at the ball of her foot. It hurts to walk. No history of fracture.  Memory loss. She has seen Dr. love in the past and been placed on Aricept 10 mg. She states that she had been seen by him in the last few months but on further questioning it may have been a year. She used to run out of the Aricept and just stopped it a month ago since that time she has had deterioration in her memory forgetting early morning activities what her schedule is. This has increased her anxiety level. She is also acutely aware of this problem which makes her depressed.  Mood and anxiety no history of depression or severe anxiety however recently with multiple losses  of many supporting people  Have died and now  Depression sx have occurred and she appears to be retreating.     Lonesome and sad. Patient agrees with daughter about the situation. She generally is hesitant to take any medication but is willing to take some  period    Review of Systems Negative for chest pain shortness of breath falling major changes in hearing or vision GI symptoms.  Past history of shingles nothing now. History of PMR and has been on prednisone very low dose her rheumatologist Dr. Maurice Small has retired. There is some confusion about taking her prednisone. No fevers sweats or weight loss.  UI      Objective:   Physical Exam Well-developed well-nourished in no acute distress slightly anxious. Normal verbal conversation except slow asking to repeat information. Daughter  adds to help with details. Then she  agree or disagree.  Vital signs stable chart and records reviewed. Lower extremities slight edema very thin skin faded bruises left ankle swollen anteriorly without tenderness any central dark hemorrhagic skin area that feels fluctuant question hematoma.   Right foot with no acute deformity but tenderness on the metatarsal areas. No bony point tenderness Anterior tibial areas with some redness3 looking some pigmentation that is old. No ulcers are noted Skin on arms some thickened excoriated areas near the elbows without infection. Some cracking and scaling. In discrete border. Neuro nonfocal formal memory testing not done today. Short-term memory is out of state decreased by conversation.     Assessment & Plan:  Legs  great concern by patient and daughter about recurrent bruises and pigmentation. Currently no  signs of infection. It is not unreasonable to get an opinion at [facility] dermatology about her situation to make sure she has no other underlying disease cause of her leg changes. We discussed stasis dermatitis and pigmentation. I also wonder if she is having recurrent minor trauma that she does not remember.  No other evidence of a bleeding disorder  Rash on arm question eczema contact or scratch itch dermatitis lichenified skin. We'll add steroid ointment in the meantime.  Derm  Consult. Reasonable to do dermatology  consult at [facility] to get an opinion for any other secondary causes of her situation Memory impairment  apparently worsened after going off Aricept which apparently was not intentional but perhaps she just ran out of the medicine and thought she didn't need any more.  Discussed the limitation that she may not get to baseline when she restarts but it's only been a number of weeks and she has been off. Since she had no side effects we will start back at 5 mg and increase to 10 mg as tolerated.   Depression anxiety    This is a reactive she's not suicidal options discussed. We can and low-dose Zoloft over time continue support system that is available. Consider psychiatric consult if needed for medication. Although she would prefer no medicine is willing to try this. Discussed with both of them the importance of someone checking her meds because she is in the independent living part of friends home. Both she and her daughter do not think they need to step up care. However someone should monitor the medications.  Right foot pain after box fell on it. Don't see obvious signs of fracture but because of pain we'll get an x-ray and let her know the results.  PMR Apparently in remission  but some question about what she's taking very low dose prednisone to call Dr. Jeanine Luz office about this.   Total visit 55 mins > 50% spent counseling and coordinating care .   Addendum after patient left noted in the electronic record that she may have been given Colcrys  tablets at the beginning of June . Was unable to ask them about the period he would've prescribed.   I do not know she's taking this.

## 2011-02-23 ENCOUNTER — Ambulatory Visit (INDEPENDENT_AMBULATORY_CARE_PROVIDER_SITE_OTHER)
Admission: RE | Admit: 2011-02-23 | Discharge: 2011-02-23 | Disposition: A | Discharge status: Home / Self Care | Payer: Medicare Other | Source: Ambulatory Visit | Attending: Internal Medicine | Admitting: Internal Medicine

## 2011-02-23 DIAGNOSIS — M79671 Pain in right foot: Secondary | ICD-10-CM

## 2011-02-23 DIAGNOSIS — M79609 Pain in unspecified limb: Secondary | ICD-10-CM

## 2011-02-24 NOTE — Progress Notes (Signed)
Pt aware of results 

## 2011-03-03 ENCOUNTER — Other Ambulatory Visit: Payer: Self-pay | Admitting: Internal Medicine

## 2011-03-13 ENCOUNTER — Encounter: Payer: Self-pay | Admitting: Internal Medicine

## 2011-03-13 ENCOUNTER — Ambulatory Visit (INDEPENDENT_AMBULATORY_CARE_PROVIDER_SITE_OTHER): Payer: Medicare Other | Admitting: Internal Medicine

## 2011-03-13 VITALS — BP 110/80 | HR 66 | Temp 98.5°F

## 2011-03-13 DIAGNOSIS — G3184 Mild cognitive impairment, so stated: Secondary | ICD-10-CM

## 2011-03-13 DIAGNOSIS — I8310 Varicose veins of unspecified lower extremity with inflammation: Secondary | ICD-10-CM

## 2011-03-13 DIAGNOSIS — M353 Polymyalgia rheumatica: Secondary | ICD-10-CM

## 2011-03-13 DIAGNOSIS — M79671 Pain in right foot: Secondary | ICD-10-CM

## 2011-03-13 DIAGNOSIS — I10 Essential (primary) hypertension: Secondary | ICD-10-CM

## 2011-03-13 DIAGNOSIS — M79609 Pain in unspecified limb: Secondary | ICD-10-CM

## 2011-03-13 DIAGNOSIS — F329 Major depressive disorder, single episode, unspecified: Secondary | ICD-10-CM

## 2011-03-13 DIAGNOSIS — I872 Venous insufficiency (chronic) (peripheral): Secondary | ICD-10-CM | POA: Insufficient documentation

## 2011-03-13 DIAGNOSIS — L259 Unspecified contact dermatitis, unspecified cause: Secondary | ICD-10-CM

## 2011-03-13 NOTE — Progress Notes (Signed)
  Subjective:    Patient ID: Tricia Mendez, female    DOB: 1924/07/18, 77 y.o.   MRN: [id number]  HPI Patient comes in with daughter today for followup  And discussion of multiple medical issues. Skin : Since last visit we're using Aquaphor as a moisturizer and the triamcinolone ointment on her arms for the itching. Her arms are much better and her legs are better since she is elevating them and avoiding trauma. She does however have an ulcer in the area on her left ankle they used to be bigger it is slowly healing there is no warmth but some slight redness around it. There is an appointment with [facility] dermatology pending. Foot pain: Never got a call about the x-ray but the pain is bad her x-ray showed some chondrocalcinosis but no acute changes and no fractures Cognition:   Still problematic back now on the Aricept up to 10 mg. Question slight improvement in clarity.  Mood :  Now on zoloft up to 50 mg  For a week for depression.  Unclear if it is helping perhaps slightly but there are no side effects.   Past history family history social history reviewed in the electronic medical record.  Review of Systems Negative chest pain shortness of breath falling new symptoms. There is some exercises that are done at friend's home but she doesn't get much her formal exercise except when she takes her and walk. No history of falling used a walker when her foot was hurting    Objective:   Physical Exam Well-developed well-nourished in no acute distress looks less frustrated than last visit. Her gait/ balance/ and motor skills are very good.  Skin: Skin on forearms has no more erythema and thickening is much decreased no fissuring noted. Lower extremities shins more normal pigmentation with no bruising present. Left ankle area has a quarter-sized superficial ulcer with no warmth. The base is darker in color. Minimal tenderness. No focal tenderness on feet. Vascular intact otherwise. Normal capillary  refill.   reviewed information with daughter and patient.  Assessment & Plan:  Dermatitis  eczematous upper extremity better continue moisturizer add steroid as needed.  Lower extremity dermatitis presumed venous plus trauma also improved less swelling however there is an ulcer on the left ankle at this time we'll not add antibiotic but continued cover local care but to call if increasing in size not healing or signs of infection otherwise.  Cognitive impairment back on Aricept without side effect.  Depressed mood has not been on Zoloft 50 mg long enough to be able to tell effect but she appears to be somewhat brighter affect today possibly from all the attention and others helping organizing some of her issues.  To continue and follow up   PMR  but she was off the prednisone but apparently she is on it they will call with the milligrams but she is taking.  Foot pain  resolved apologies that she didn't find her results reported to them today. Hypertension  Controlled  We'll continue above regimen and look into aerobic exercise programs at friend's home followup in a month or as needed. Continued local care to various skin sites.  Total visit > 50% spent counseling and coordinating care

## 2011-03-13 NOTE — Patient Instructions (Signed)
Continue medication as above  Rov in one month

## 2011-03-27 ENCOUNTER — Telehealth: Payer: Self-pay | Admitting: *Deleted

## 2011-03-27 NOTE — Telephone Encounter (Signed)
Spoke with patient.

## 2011-03-27 NOTE — Telephone Encounter (Signed)
Pt has skin condition and she is at the beach.  She bumped her leg yesterday and she has spot on her leg that looks blood filled.  Not painful or warm to the touch. Pt request to speak to [facility]

## 2011-03-27 NOTE — Telephone Encounter (Signed)
Per Dr. Fabian Sharp- put antibiotic ointment on it and keep it covered.

## 2011-04-03 ENCOUNTER — Other Ambulatory Visit: Payer: Self-pay | Admitting: Internal Medicine

## 2011-04-17 ENCOUNTER — Other Ambulatory Visit: Payer: Self-pay | Admitting: Internal Medicine

## 2011-04-24 ENCOUNTER — Ambulatory Visit (INDEPENDENT_AMBULATORY_CARE_PROVIDER_SITE_OTHER): Payer: Medicare Other | Admitting: Internal Medicine

## 2011-04-24 ENCOUNTER — Encounter: Payer: Self-pay | Admitting: Internal Medicine

## 2011-04-24 VITALS — BP 110/70 | HR 60 | Temp 98.4°F

## 2011-04-24 DIAGNOSIS — F329 Major depressive disorder, single episode, unspecified: Secondary | ICD-10-CM

## 2011-04-24 DIAGNOSIS — F09 Unspecified mental disorder due to known physiological condition: Secondary | ICD-10-CM

## 2011-04-24 DIAGNOSIS — I8390 Asymptomatic varicose veins of unspecified lower extremity: Secondary | ICD-10-CM

## 2011-04-24 DIAGNOSIS — I10 Essential (primary) hypertension: Secondary | ICD-10-CM

## 2011-04-24 DIAGNOSIS — M353 Polymyalgia rheumatica: Secondary | ICD-10-CM

## 2011-04-24 DIAGNOSIS — R4189 Other symptoms and signs involving cognitive functions and awareness: Secondary | ICD-10-CM

## 2011-04-24 LAB — CBC WITH DIFFERENTIAL/PLATELET
Basophils Absolute: 0.1 10*3/uL (ref 0.0–0.1)
Eosinophils Relative: 2.9 % (ref 0.0–5.0)
Hemoglobin: 14.6 g/dL (ref 12.0–15.0)
Lymphocytes Relative: 34.4 % (ref 12.0–46.0)
Monocytes Relative: 11.5 % (ref 3.0–12.0)
Neutro Abs: 3.6 10*3/uL (ref 1.4–7.7)
RDW: 13.7 % (ref 11.5–14.6)
WBC: 7.1 10*3/uL (ref 4.5–10.5)

## 2011-04-24 LAB — LIPID PANEL
Cholesterol: 193 mg/dL (ref 0–200)
HDL: 48.3 mg/dL (ref >39.00)
VLDL: 27.4 mg/dL (ref 0.0–40.0)

## 2011-04-24 LAB — TSH: TSH: 0.49 u[IU]/mL (ref 0.35–5.50)

## 2011-04-24 LAB — HIGH SENSITIVITY CRP: CRP, High Sensitivity: 3.89 mg/L (ref 0.000–5.000)

## 2011-04-24 NOTE — Patient Instructions (Addendum)
Continue same medications.  Will notify you  of labs when available. And then plan follow up appt  Get a mammogram .   Get Dr Cleta Alberts to send [place] a copy of Office visit and Tetanus shot  Update.

## 2011-05-01 DIAGNOSIS — R4189 Other symptoms and signs involving cognitive functions and awareness: Secondary | ICD-10-CM | POA: Insufficient documentation

## 2011-05-01 NOTE — Progress Notes (Signed)
  Subjective:    Patient ID: Tricia Mendez, female    DOB: May 22, 1924, 77 y.o.   MRN: [id number]  HPI the patient Patient comes in today for follow up of  multiple medical problems.  mostly f/u of beginning antidepressant at last visit. IN the meantime she did see derm at [facility] who agreed skin changes related to steroid use and age.  still better than before but did hit shin on bed when at the beach and has abrasion covered.  Seen at urgent care who is to see her soon also. Getting better. Note for daughter  Synopsis if what is happening is reviewed   Mood much better per family  Although unclear if patient noted this.maybe cant remember . PMR: Dr Dierdre Forth decreasing her prednisone and now on 2 mg and to follow  slow decreasing .  HCM and med management  ? Due for blood tests  Unclear what specialist has done blood monitoring last.    Review of Systems Neg cp sob vision changes falling  cognitition still problematic but motor skills are excellent.  Past history family history social history reviewed in the electronic medical record.     Objective:   Physical Exam WDWN in nad  Much brighter less distressed affect . Neuro non focal balance very good.   lle covered with sealed bandage . Oriented to person place  BP good  Reviewed  Daughters  Update.    Assessment & Plan:  Depressive reaction   doing much better on current med. NO obv SE seen  Will maintain .  PMR  Slow pred wean on 2 mg  now  Cognitive decline some better after rx fore depression.  Leg abrasion under urgent care management for wound ( has seen x 2 )   Will let then continue .   ? Go td or tdap when injury happened.   HT controlled

## 2011-05-02 ENCOUNTER — Encounter: Payer: Self-pay | Admitting: *Deleted

## 2011-05-02 LAB — BASIC METABOLIC PANEL
CO2: 29 mEq/L (ref 19–32)
Calcium: 9.3 mg/dL (ref 8.4–10.5)
Creatinine, Ser: 0.6 mg/dL (ref 0.4–1.2)
Glucose, Bld: 101 mg/dL — ABNORMAL HIGH (ref 70–99)

## 2011-05-02 NOTE — Progress Notes (Signed)
Addended by: Rita Ohara R on: 05/02/2011 04:12 PM   Modules accepted: Orders

## 2011-05-04 ENCOUNTER — Telehealth: Payer: Self-pay | Admitting: *Deleted

## 2011-05-04 NOTE — Telephone Encounter (Signed)
Message copied by Romualdo Bolk on Thu May 04, 2011 12:51 PM ------      Message from: [facility], [place] K      Created: Thu May 04, 2011  5:47 AM       Tell  Pt kidney function  Chemistry panel are  good .            Advise return office visit in 2-3 months or as needed. No change in meds

## 2011-05-04 NOTE — Telephone Encounter (Signed)
Left message for pt to call back  °

## 2011-05-08 NOTE — Telephone Encounter (Signed)
Pt aware of results 

## 2011-05-08 NOTE — Telephone Encounter (Signed)
Left message to call back  

## 2011-05-29 ENCOUNTER — Other Ambulatory Visit: Payer: Self-pay | Admitting: Internal Medicine

## 2011-05-29 NOTE — Telephone Encounter (Signed)
Wants Carollee Herter to call regarding a referral? Thanks.

## 2011-05-30 NOTE — Telephone Encounter (Signed)
Spoke to pt and I gave her the info about that I have. She spoke with Terri and got more info. Rx sent to pharmacy.

## 2011-06-30 ENCOUNTER — Other Ambulatory Visit: Payer: Self-pay | Admitting: Internal Medicine

## 2011-07-10 ENCOUNTER — Encounter: Payer: Self-pay | Admitting: Internal Medicine

## 2011-07-10 ENCOUNTER — Ambulatory Visit (INDEPENDENT_AMBULATORY_CARE_PROVIDER_SITE_OTHER): Payer: Medicare Other | Admitting: Internal Medicine

## 2011-07-10 VITALS — BP 130/80 | HR 72 | Temp 97.9°F

## 2011-07-10 DIAGNOSIS — I10 Essential (primary) hypertension: Secondary | ICD-10-CM

## 2011-07-10 DIAGNOSIS — T887XXA Unspecified adverse effect of drug or medicament, initial encounter: Secondary | ICD-10-CM

## 2011-07-10 DIAGNOSIS — R32 Unspecified urinary incontinence: Secondary | ICD-10-CM

## 2011-07-10 DIAGNOSIS — R42 Dizziness and giddiness: Secondary | ICD-10-CM

## 2011-07-10 DIAGNOSIS — L259 Unspecified contact dermatitis, unspecified cause: Secondary | ICD-10-CM

## 2011-07-10 DIAGNOSIS — F329 Major depressive disorder, single episode, unspecified: Secondary | ICD-10-CM

## 2011-07-10 DIAGNOSIS — G3184 Mild cognitive impairment, so stated: Secondary | ICD-10-CM

## 2011-07-10 LAB — POCT HEMOGLOBIN: Hemoglobin: 17.2

## 2011-07-10 NOTE — Assessment & Plan Note (Signed)
Seems normal mood today

## 2011-07-10 NOTE — Assessment & Plan Note (Signed)
No sig change by report.

## 2011-07-10 NOTE — Progress Notes (Signed)
  Subjective:    Patient ID: Tricia Mendez, female    DOB: [birth year], 77 y.o.   MRN: [id number]  HPI Patient comes in today for SDA  For acute problem evaluation. Here with daughter and sister .  When went to  Toilet head "going crazy "  In head and then  Eyes felt went hent up and dwn and Nausea and no vomiting and then stopped. Rolled over  In bed and got worse. Happened  3 x this am and then better .   Feels ok now just uncertain.  No vision changes  But off .  ? Nurse assessment. See phone note.  Just weak for now.  Just changes uro med from vesicare to another sample  Is on 3 rd -4th day.  Eating ok now.  Memory and mood stable appetite good bu daughter worried she may be skipping breakfast. Review of Systems No fever syncope cp  HA sob weakness or focal changes speech changes  [facility] had [facility] that was normal.   Rash itching left forearms.  Using neosporin.   Past history family history social history reviewed in the electronic medical record.     Objective:   Physical Exam Here with sis and daughter . WDWN in nad oriented to time person and place  HEENT: Normocephalic ;atraumatic , Eyes;  PERRL, EOMs  Full, lids and conjunctiva clear,,Ears: no deformities, canals nl,  Wax in right eac  TM landmarks normal, Nose: no deformity or discharge  Mouth : OP clear without lesion or edema . Tongue midline. Neck: Supple without adenopathy or masses or bruits Chest:  Clear to A&P without wheezes rales or rhonchi CV:  S1-S2 no gallops or murmurs peripheral perfusion is normal BP standing     132/72 right standing. Pulse 70  Skin:   Left forearm thickened   Red rash excoriated no infection.    right clear  Some scaling.   Neuro oriented speech nl   Cn 3-12 seem ok   Gait nl no cogwheeling or tremor F to N is normal. No focal weakness      Assessment & Plan:  Dizzy episode  sounds almost vertiginous vs lightheaded after toileting   And change of UI med  Could be med se  .   Will go back to  Prev meds. Call with name of new med  For the record.  Rash contact neurodermatitis also . Local care and follow up   PMR last labs so far are good  .   Cognitive status   No obv change on meds  Total visit  40 mins > 50% spent counseling and coordinating care

## 2011-07-10 NOTE — Patient Instructions (Addendum)
This certainly could be  From changing meds .  Can get  Light headed when moving but should go away with time. Your exam is good today. Get copy of labs sent to [place] and can decide if need further workup.  If continuing to be a problem then consider other evaluation.   Stop the polysporin  or neosporin as this could . Make the rash worse.  Aquaphor 3 x per day to keep moist.  And hydrocortisone cream.   For  Itching  twice a day.   Consider   Long sleeves to avoid scratching in  Your sleep.   Call in 2 weeks if not better and can call  In stronger cortisone therapy.

## 2011-07-11 ENCOUNTER — Telehealth: Payer: Self-pay | Admitting: *Deleted

## 2011-07-11 NOTE — Telephone Encounter (Signed)
Spoke with pt she was having dizziness according to the nurse her neuro exam was benign bp was 146/74 hr 54 rsp 18 96% o2. Pt was lightheaded x3, she didn't pass out. She had some nausea no vomiting. She was fine as long as she was setting. She was okay walking no spots in her eyes, no blurred vision.

## 2011-08-11 ENCOUNTER — Other Ambulatory Visit: Payer: Self-pay | Admitting: Internal Medicine

## 2011-08-26 ENCOUNTER — Other Ambulatory Visit: Payer: Self-pay | Admitting: Internal Medicine

## 2011-08-28 NOTE — Telephone Encounter (Signed)
Pt last se

## 2011-08-30 ENCOUNTER — Other Ambulatory Visit: Payer: Self-pay | Admitting: Internal Medicine

## 2011-09-09 ENCOUNTER — Ambulatory Visit (INDEPENDENT_AMBULATORY_CARE_PROVIDER_SITE_OTHER): Payer: Medicare Other

## 2011-09-09 DIAGNOSIS — L241 Irritant contact dermatitis due to oils and greases: Secondary | ICD-10-CM

## 2011-09-12 ENCOUNTER — Telehealth: Payer: Self-pay | Admitting: *Deleted

## 2011-09-12 MED ORDER — ATENOLOL-CHLORTHALIDONE 50-25 MG PO TABS: 1.0000 | ORAL_TABLET | Freq: Every day | ORAL | Status: DC

## 2011-09-12 NOTE — Telephone Encounter (Signed)
Refill on atenolo-chlorthal 50/25; Pt needs to schedule a follow up appt before next refill. Pt aware and appt made.

## 2011-09-19 ENCOUNTER — Telehealth: Payer: Self-pay | Admitting: *Deleted

## 2011-09-19 NOTE — Telephone Encounter (Signed)
I spoke with Tricia Mendez and she wants [place] to hold off on sending rx's to [facility] until she tells [place] otherwise.

## 2011-09-19 NOTE — Telephone Encounter (Signed)
[facility] sent [place] a fax for refills on pt's atenolol/chlorthalid 50/25, zoloft 50, and donepezil 10mg . We have never sent rx's to [facility] for this pt before.   Left message to call back.

## 2011-09-27 ENCOUNTER — Ambulatory Visit (INDEPENDENT_AMBULATORY_CARE_PROVIDER_SITE_OTHER): Payer: Medicare Other | Admitting: Internal Medicine

## 2011-09-27 ENCOUNTER — Encounter: Payer: Self-pay | Admitting: Internal Medicine

## 2011-09-27 VITALS — BP 120/80 | HR 78 | Wt 174.0 lb

## 2011-09-27 DIAGNOSIS — G3184 Mild cognitive impairment, so stated: Secondary | ICD-10-CM

## 2011-09-27 DIAGNOSIS — M353 Polymyalgia rheumatica: Secondary | ICD-10-CM

## 2011-09-27 DIAGNOSIS — F329 Major depressive disorder, single episode, unspecified: Secondary | ICD-10-CM

## 2011-09-27 DIAGNOSIS — N390 Urinary tract infection, site not specified: Secondary | ICD-10-CM

## 2011-09-27 DIAGNOSIS — I10 Essential (primary) hypertension: Secondary | ICD-10-CM

## 2011-09-27 DIAGNOSIS — R32 Unspecified urinary incontinence: Secondary | ICD-10-CM

## 2011-09-27 DIAGNOSIS — R300 Dysuria: Secondary | ICD-10-CM

## 2011-09-27 DIAGNOSIS — M1990 Unspecified osteoarthritis, unspecified site: Secondary | ICD-10-CM

## 2011-09-27 LAB — POCT URINALYSIS DIPSTICK
Protein, UA: NEGATIVE
Urobilinogen, UA: 0.2

## 2011-09-27 MED ORDER — CIPROFLOXACIN HCL 250 MG PO TABS: 250.0000 mg | ORAL_TABLET | Freq: Two times a day (BID) | ORAL | Status: AC

## 2011-09-27 NOTE — Progress Notes (Signed)
  Subjective:    Patient ID: Tricia Mendez, female    DOB: 04/11/1924, 78 y.o.   MRN: [id number]  HPI Patient comes in today for follow up of  multiple medical problems.  With her daughter. Since last visit no major changes PMR:    Had some wd sx and now on  pred  2.mg  Memory not a lot of difference in months recently . Some discouraged ing.  Depression:   Stable  Family thinks shes a lit better than before meds.  Out going still driving and doesnt get lost .  Going on road trip son with other friends Skin  : no flare still some itching  No falling  Change in vision. UI worse recently no fever   Bp controlled  Review of Systems No fever cp sob bleeding  Cough falling ha vision change Past history family history social history reviewed in the electronic medical record.     Objective:   Physical Exam WDWN in nad HEENT grossly nl  Neck: Supple without adenopathy or masses or bruits HEENT: Normocephalic ;atraumatic , Eyes;  PERRL, EOMs  Full, lids and conjunctiva clear,,Ears: no deformities, canals nl, TM landmarks normal, Nose: no deformity or discharge  Mouth : OP clear without lesion or edema . Gait wnl  Skin  Clear shins  Old pigmentation  Some thickening forearms  Oreinted x 3 nl speech neuro non focal  Good balance  Abdomen:  Sof,t normal bowel sounds without hepatosplenomegaly, no guarding rebound or masses no CVA tenderness UA see labs pos nitrites        Assessment & Plan:  HT controlled  Memory   About the same still frustrating to patient  Disc about driving and reaction time if thiings deteriorate .  Pt doesn't drive at night. Has note been disoriented and no accidents UI   Some worse  > if infection   PMR  Stable  Still trying to wean.

## 2011-09-27 NOTE — Patient Instructions (Signed)
No change in  meds at this time. wellness visit  And will do labs at that time in 6 months .  Call us if you find out the name of medicine that gave you a side effect.  You have na infection will call about culture results.

## 2011-09-30 LAB — URINE CULTURE

## 2011-10-03 NOTE — Progress Notes (Signed)
Quick Note:  Left message to call back. ______ 

## 2011-10-08 ENCOUNTER — Encounter: Payer: Self-pay | Admitting: Internal Medicine

## 2011-10-08 DIAGNOSIS — N390 Urinary tract infection, site not specified: Secondary | ICD-10-CM | POA: Insufficient documentation

## 2011-10-11 ENCOUNTER — Telehealth: Payer: Self-pay | Admitting: *Deleted

## 2011-10-11 MED ORDER — ATENOLOL-CHLORTHALIDONE 50-25 MG PO TABS: 1.0000 | ORAL_TABLET | Freq: Every day | ORAL | Status: DC

## 2011-10-11 NOTE — Telephone Encounter (Signed)
Refill on atenolol-chlorthal

## 2011-10-27 ENCOUNTER — Other Ambulatory Visit: Payer: Self-pay | Admitting: Internal Medicine

## 2011-11-09 ENCOUNTER — Encounter: Payer: Self-pay | Admitting: *Deleted

## 2011-11-09 NOTE — Progress Notes (Signed)
Quick Note:  Mailed letter about urine culture. ______

## 2011-11-15 NOTE — Progress Notes (Signed)
Quick Note:  Pt aware of results. ______ 

## 2011-12-11 ENCOUNTER — Encounter: Payer: Self-pay | Admitting: Internal Medicine

## 2011-12-11 ENCOUNTER — Ambulatory Visit (INDEPENDENT_AMBULATORY_CARE_PROVIDER_SITE_OTHER): Payer: Medicare Other | Admitting: Internal Medicine

## 2011-12-11 VITALS — BP 122/84 | HR 60 | Temp 98.3°F | Wt 172.0 lb

## 2011-12-11 DIAGNOSIS — R350 Frequency of micturition: Secondary | ICD-10-CM

## 2011-12-11 DIAGNOSIS — T887XXA Unspecified adverse effect of drug or medicament, initial encounter: Secondary | ICD-10-CM | POA: Insufficient documentation

## 2011-12-11 DIAGNOSIS — M353 Polymyalgia rheumatica: Secondary | ICD-10-CM

## 2011-12-11 DIAGNOSIS — N390 Urinary tract infection, site not specified: Secondary | ICD-10-CM

## 2011-12-11 DIAGNOSIS — R32 Unspecified urinary incontinence: Secondary | ICD-10-CM

## 2011-12-11 DIAGNOSIS — R4189 Other symptoms and signs involving cognitive functions and awareness: Secondary | ICD-10-CM

## 2011-12-11 DIAGNOSIS — M545 Low back pain: Secondary | ICD-10-CM | POA: Insufficient documentation

## 2011-12-11 DIAGNOSIS — F09 Unspecified mental disorder due to known physiological condition: Secondary | ICD-10-CM

## 2011-12-11 LAB — POCT URINALYSIS DIPSTICK
Blood, UA: NEGATIVE
Glucose, UA: NEGATIVE
Nitrite, UA: POSITIVE
Spec Grav, UA: 1.01
Urobilinogen, UA: 0.2
pH, UA: 6

## 2011-12-11 MED ORDER — SULFAMETHOXAZOLE-TRIMETHOPRIM 800-160 MG PO TABS: 1.0000 | ORAL_TABLET | Freq: Two times a day (BID) | ORAL | Status: DC

## 2011-12-11 NOTE — Progress Notes (Signed)
  Subjective:    Patient ID: Tricia Mendez, female    DOB: 06-Jun-1924, 78 y.o.   MRN: [id number]  HPI Patient comes in today for SDA for  new problem evaluation.Here with daughter. Having one week of bac Lumbar LBP withtout radiation.  Now increase incontinence at night . No fever   Had se of med from  Urology cns myrbetrig  Unsure if has been taking vesicare  nad if helps  No fever falling  Change in cognition.bleeding    Review of Systems No fever  Chills  Vomiting or diarrhea. No bleeding  Past history family history social history reviewed in the electronic medical record. Outpatient Prescriptions Prior to Visit  Medication Sig Dispense Refill  . Ascorbic Acid (VITAMIN C) 100 MG tablet Take 100 mg by mouth daily.        Marland Kitchen atenolol-chlorthalidone (TENORETIC) 50-25 MG per tablet Take 1 tablet by mouth daily.  30 tablet  3  . calcium carbonate (OS-CAL) 1250 MG chewable tablet Chew 1 tablet by mouth daily.      . cholecalciferol (VITAMIN D) 1000 UNITS tablet Take 1,000 Units by mouth daily.        Marland Kitchen donepezil (ARICEPT) 10 MG tablet       . donepezil (ARICEPT) 10 MG tablet TAKE 1/2 TABLET BY MOUTH EVERY DAY AND INCREASE TO 1 TABLET AFTER A FEW WEEKS  30 tablet  1  . MULTIPLE VITAMIN PO Take by mouth.        . predniSONE (DELTASONE) 1 MG tablet Take 2 mg by mouth daily.       . sertraline (ZOLOFT) 50 MG tablet       . solifenacin (VESICARE) 10 MG tablet Take 10 mg by mouth daily.             Objective:   Physical Exam  BP 122/84  Pulse 60  Temp(Src) 98.3 F (36.8 C) (Oral)  Wt 172 lb (78.019 kg)  SpO2 96% wdwn in nad Neck: Supple without adenopathy or masses or bruits Chest:  Clear to A&P without wheezes rales or rhonchi CV:  S1-S2 no gallops or murmurs peripheral perfusion is normal Abdomen:  Sof,t normal bowel sounds without hepatosplenomegaly, no guarding rebound or masses no CVA tenderness lbp  No point tenderness.  ua pos nitrites and leuk  Send for culture      Assessment & Plan:  prob uti    Ad sxt culture pending  lbp associated with timing UI hx  Se of uro meds    Call uro about other meds  Ht  PMR cognitive decline    Total visit > 50% spent counseling and coordinating care

## 2011-12-11 NOTE — Patient Instructions (Signed)
You have a uti  Antibiotic  And will let you know when results of culture are  back.  Disc poss  Other interventions with the urologist .

## 2011-12-14 LAB — URINE CULTURE: Colony Count: >=100000

## 2011-12-18 ENCOUNTER — Other Ambulatory Visit: Payer: Self-pay | Admitting: *Deleted

## 2011-12-18 MED ORDER — CEPHALEXIN 500 MG PO CAPS: 500.0000 mg | ORAL_CAPSULE | Freq: Four times a day (QID) | ORAL | Status: AC

## 2011-12-20 ENCOUNTER — Telehealth: Payer: Self-pay | Admitting: *Deleted

## 2011-12-20 NOTE — Telephone Encounter (Signed)
Spoke with pt's daughter Eber Jones and she states she has a concern about e.coli being found in her mother's urine culture because it is very serious.  Per pt's daughter pt has been experiencing diarrhea and pt's daughter would like to know if a probiotic is needed to restore the balance in her mother intestines.  Pt's daughter would like to know if pt needs to be retested.

## 2011-12-20 NOTE — Telephone Encounter (Signed)
Lissa Morales (daughter) is returning Alisha's call from last night, and would like to have a return call this am.  She has some questions to ask also.

## 2011-12-20 NOTE — Progress Notes (Signed)
Quick Note:  Left a message for pt's daughter Darl Pikes) to return call. ______

## 2011-12-20 NOTE — Telephone Encounter (Signed)
Ok to use an probiotic  It is possible the antibiotic is doing this. If not getting better call about this . Also she can decrease the med to twice a day and see if this helps.   E coli is a typical very common.  UTI germ in all agesnot more serious than other utis unless it causes a blood stream infection with fever and other symptoms .

## 2011-12-21 ENCOUNTER — Telehealth: Payer: Self-pay | Admitting: *Deleted

## 2011-12-21 NOTE — Telephone Encounter (Signed)
Pt's daughter is aware 

## 2011-12-21 NOTE — Progress Notes (Signed)
Quick Note:  Pt's daughter aware of lab results. ______

## 2011-12-21 NOTE — Telephone Encounter (Signed)
Left a message for Okey Regal (pt's daughter) to return call.

## 2011-12-21 NOTE — Telephone Encounter (Signed)
Warnell Bureau (daughter) is calling and very anxious to speak to Dr. Fabian Sharp or Elease Hashimoto per phone call she had with Alisha last night.  Please call after 1 pm today.

## 2011-12-22 ENCOUNTER — Other Ambulatory Visit: Payer: Self-pay | Admitting: Internal Medicine

## 2011-12-22 NOTE — Telephone Encounter (Signed)
Spoke with pt's daughter on 12/21/11 .

## 2011-12-26 ENCOUNTER — Other Ambulatory Visit (INDEPENDENT_AMBULATORY_CARE_PROVIDER_SITE_OTHER): Payer: Medicare Other | Admitting: Internal Medicine

## 2011-12-26 ENCOUNTER — Other Ambulatory Visit: Payer: Medicare Other

## 2011-12-26 ENCOUNTER — Other Ambulatory Visit: Payer: Self-pay | Admitting: Internal Medicine

## 2011-12-26 DIAGNOSIS — N390 Urinary tract infection, site not specified: Secondary | ICD-10-CM

## 2011-12-26 DIAGNOSIS — R350 Frequency of micturition: Secondary | ICD-10-CM

## 2011-12-26 LAB — POCT URINALYSIS DIPSTICK
Bilirubin, UA: NEGATIVE
Glucose, UA: NEGATIVE
Nitrite, UA: NEGATIVE
Spec Grav, UA: 1.025
Urobilinogen, UA: 0.2

## 2011-12-26 NOTE — Telephone Encounter (Signed)
Per Dr. Fabian Sharp ok for pt to have u/a and culture.  Pt is aware and has been put on the lab schedule.

## 2011-12-26 NOTE — Telephone Encounter (Signed)
Pt is req to come in to get a urine sample done. Pt said that she had already spoken with the nurse about it.

## 2011-12-28 ENCOUNTER — Telehealth: Payer: Self-pay | Admitting: Internal Medicine

## 2011-12-28 NOTE — Telephone Encounter (Signed)
Pt requesting results of UA

## 2011-12-29 ENCOUNTER — Telehealth: Payer: Self-pay | Admitting: *Deleted

## 2011-12-29 MED ORDER — AMOXICILLIN 500 MG PO CAPS: 500.0000 mg | ORAL_CAPSULE | Freq: Three times a day (TID) | ORAL | Status: AC

## 2011-12-29 NOTE — Telephone Encounter (Signed)
Pt is calling to speak to [facility] to discuss Urine results ASAP.  States she has been trying to speak to someone for days about her results.

## 2011-12-29 NOTE — Telephone Encounter (Signed)
Duplicate message; a message has been sent to Dr. Fabian Sharp about culture results.

## 2011-12-29 NOTE — Telephone Encounter (Signed)
I have not gotten any message except this one and her culture results are only preliminary today   This afternoon.Marland Kitchen )  I  was out of  Office yesterday pm and hopefully the family was told this.   Please document how many times she has called and what is the worrisome issue  as I see no record of such. There may be a different germ in her urine   But dont have the info about antibioitc  Sensitivity .  We  Can guess and give her amoxicillin 500 mg tid for 7 days over the weekend if she is having uti sx. And tell her next week the final result.

## 2011-12-29 NOTE — Telephone Encounter (Signed)
Pls advise.  

## 2011-12-29 NOTE — Telephone Encounter (Signed)
Spoke with pt and she is aware of Dr. Rosezella Florida recommendations.  Pt aware that urine cultures take a few days to come back and the preliminary results were avilable today but the sensitivy results were not back yet.  Pt states she would like the amoxicillin called in to [facility] on [address].  Advised pt to call back on Monday to state how she is feeling and if UTI symptoms are still present.   Pt request I call her daughter attempted to call Gertie Gowda at [phone] but the message states it is not the correct number.

## 2011-12-30 LAB — URINE CULTURE: Colony Count: 15000

## 2012-01-01 NOTE — Progress Notes (Signed)
Quick Note:  Spoke with pt's daughter Darl Pikes and she is aware. ______

## 2012-01-01 NOTE — Telephone Encounter (Signed)
Pt's daughter aware of results °

## 2012-01-02 ENCOUNTER — Telehealth: Payer: Self-pay

## 2012-01-02 NOTE — Telephone Encounter (Signed)
Spoke with Tricia Mendez pt's daughter and she would like to know what needs to be done after pt has finished this antibotic.  According to pt's daughter pt was not showing any symptoms of having a uti.  Pls advise.

## 2012-01-02 NOTE — Telephone Encounter (Signed)
i wouldn't do anything if she is feeling ok. If getting recurrent problems we can consider seeing urologist again .

## 2012-01-02 NOTE — Telephone Encounter (Signed)
Attempted to call number provided, seems to be a wrong number?

## 2012-01-03 NOTE — Telephone Encounter (Signed)
Spoke with pt's daughter Okey Regal and she is aware of Dr. Rosezella Florida recommendations.

## 2012-02-22 ENCOUNTER — Other Ambulatory Visit: Payer: Self-pay | Admitting: Internal Medicine

## 2012-04-27 ENCOUNTER — Other Ambulatory Visit: Payer: Self-pay | Admitting: Internal Medicine

## 2012-05-01 ENCOUNTER — Telehealth: Payer: Self-pay | Admitting: Internal Medicine

## 2012-05-01 NOTE — Telephone Encounter (Signed)
Sent!

## 2012-05-01 NOTE — Telephone Encounter (Signed)
Patient called stating that she need a refill of  her aricept sent [facility] [address]. Please assist.

## 2012-05-10 ENCOUNTER — Other Ambulatory Visit: Payer: Self-pay | Admitting: Internal Medicine

## 2012-07-04 ENCOUNTER — Telehealth: Payer: Self-pay | Admitting: Internal Medicine

## 2012-07-04 ENCOUNTER — Encounter: Payer: Self-pay | Admitting: Family Medicine

## 2012-07-04 ENCOUNTER — Ambulatory Visit (INDEPENDENT_AMBULATORY_CARE_PROVIDER_SITE_OTHER): Payer: Medicare Other | Admitting: Family Medicine

## 2012-07-04 VITALS — BP 126/68 | HR 61 | Temp 98.3°F | Wt 172.0 lb

## 2012-07-04 DIAGNOSIS — L259 Unspecified contact dermatitis, unspecified cause: Secondary | ICD-10-CM

## 2012-07-04 NOTE — Telephone Encounter (Signed)
Opened in error

## 2012-07-04 NOTE — Patient Instructions (Addendum)
-  use think non smelly, no color cream such as cerave cream (in a tub) on legs daily  -use gentle compression stockings  -elevate legs for 30 minutes 1-2 times daily  -follow up with your dermatologist or your doctor if worsening or not improving

## 2012-07-04 NOTE — Progress Notes (Signed)
Chief Complaint  Patient presents with  . itchy red rash    bilateral legs     HPI:  Dry skin on legs: -has been going on for a long time - several years -daughter who spoke to me on the phone reports she saw a dermatologist for this at [facility], Dr. Renaldo Reel -reports told related to chronic venous insufficiency and problems with skin thinning from steroid use in the past -reports has looked the same for 7-8 months -rash is itchy -denies: fevers, malaise, worsening of rash ROS: See pertinent positives and negatives per HPI.  Past Medical History  Diagnosis Date  . Cellulitis of left leg   . Eczema   . Hypertension   . Mild cognitive impairment   . Osteoarthritis   . Polymyalgia rheumatica     rx with prednisone  . Varicose veins   . Xerosis of skin   . Urinary incontinence   . History of renal stone     excision    Family History  Problem Relation Age of Onset  . Melanoma Mother     76  . Stroke Father     33  . Osteoporosis Other     History   Social History  . Marital Status: Widowed    Spouse Name: N/A    Number of Children: N/A  . Years of Education: N/A   Social History Main Topics  . Smoking status: Former Smoker    Quit date: 10/13/1945  . Smokeless tobacco: Former Neurosurgeon    Quit date: 10/01/1945  . Alcohol Use: None  . Drug Use: None  . Sexually Active: None   Other Topics Concern  . None   Social History Narrative   Widowed remote hx of tobacco x 4 yearsLiving at friends home.G4P3    Current outpatient prescriptions:Ascorbic Acid (VITAMIN C) 100 MG tablet, Take 100 mg by mouth daily.  , Disp: , Rfl: ;  atenolol-chlorthalidone (TENORETIC) 50-25 MG per tablet, TAKE 1 TABLET BY MOUTH DAILY., Disp: 30 tablet, Rfl: 2;  calcium carbonate (OS-CAL) 1250 MG chewable tablet, Chew 1 tablet by mouth daily., Disp: , Rfl: ;  cholecalciferol (VITAMIN D) 1000 UNITS tablet, Take 1,000 Units by mouth daily.  , Disp: , Rfl:  donepezil (ARICEPT) 10 MG tablet, ,  Disp: , Rfl: ;  donepezil (ARICEPT) 10 MG tablet, TAKE 1/2 TABLET BY MOUTH EVERY DAY AND INCREASE TO 1 TABLET AFTER A FEW WEEKS, Disp: 30 tablet, Rfl: 3;  MULTIPLE VITAMIN PO, Take by mouth.  , Disp: , Rfl: ;  predniSONE (DELTASONE) 1 MG tablet, Take 2 mg by mouth daily. , Disp: , Rfl: ;  sertraline (ZOLOFT) 50 MG tablet, TAKE 1 TABLET DAILY, Disp: 30 tablet, Rfl: 2 solifenacin (VESICARE) 10 MG tablet, Take 10 mg by mouth daily. , Disp: , Rfl:   EXAM:  Filed Vitals:   07/04/12 1342  BP: 126/68  Pulse: 61  Temp: 98.3 F (36.8 C)    There is no height on file to calculate BMI.  GENERAL: vitals reviewed and listed above, alert, oriented, appears well hydrated and in no acute distress  HEENT: atraumatic, conjunttiva clear, no obvious abnormalities on inspection of external nose and ears  NECK: no obvious masses on inspection  LUNGS: clear to auscultation bilaterally, no wheezes, rales or rhonchi, good air movement  CV: HRRR, no peripheral edema  SKIN: dry irritated skin on LEs bilaterally with changes c/w venous stasis and dry skin  MS: moves all extremities without noticeable abnormality  PSYCH: pleasant and cooperative, no obvious depression or anxiety  ASSESSMENT AND PLAN:  Discussed the following assessment and plan:  1. DERMATITIS    -xerosis and venous stasis - no signs of infection on exam today -recs for emmollient, gentle compression and elevation - follow up with PCP or dermatologist if worsening or not improving -Patient advised to return or notify a doctor immediately if symptoms worsen or persist or new concerns arise.  There are no Patient Instructions on file for this visit.   Kriste Basque R.

## 2012-07-31 ENCOUNTER — Other Ambulatory Visit: Payer: Self-pay | Admitting: Internal Medicine

## 2012-08-07 ENCOUNTER — Ambulatory Visit (INDEPENDENT_AMBULATORY_CARE_PROVIDER_SITE_OTHER): Payer: Medicare Other | Admitting: Internal Medicine

## 2012-08-07 ENCOUNTER — Encounter: Payer: Self-pay | Admitting: Internal Medicine

## 2012-08-07 VITALS — BP 120/70 | HR 59 | Temp 97.5°F | Ht 64.25 in | Wt 170.0 lb

## 2012-08-07 DIAGNOSIS — M545 Low back pain, unspecified: Secondary | ICD-10-CM

## 2012-08-07 DIAGNOSIS — M1990 Unspecified osteoarthritis, unspecified site: Secondary | ICD-10-CM

## 2012-08-07 DIAGNOSIS — I10 Essential (primary) hypertension: Secondary | ICD-10-CM

## 2012-08-07 DIAGNOSIS — F329 Major depressive disorder, single episode, unspecified: Secondary | ICD-10-CM

## 2012-08-07 DIAGNOSIS — R32 Unspecified urinary incontinence: Secondary | ICD-10-CM

## 2012-08-07 DIAGNOSIS — M353 Polymyalgia rheumatica: Secondary | ICD-10-CM

## 2012-08-07 DIAGNOSIS — G3184 Mild cognitive impairment, so stated: Secondary | ICD-10-CM

## 2012-08-07 DIAGNOSIS — K625 Hemorrhage of anus and rectum: Secondary | ICD-10-CM

## 2012-08-07 DIAGNOSIS — Z0000 Encounter for general adult medical examination without abnormal findings: Secondary | ICD-10-CM

## 2012-08-07 DIAGNOSIS — N390 Urinary tract infection, site not specified: Secondary | ICD-10-CM

## 2012-08-07 LAB — POCT URINALYSIS DIPSTICK
Glucose, UA: NEGATIVE
Nitrite, UA: POSITIVE
Protein, UA: NEGATIVE
Urobilinogen, UA: 0.2

## 2012-08-07 LAB — CBC WITH DIFFERENTIAL/PLATELET
Basophils Absolute: 0 10*3/uL (ref 0.0–0.1)
Eosinophils Relative: 0.9 % (ref 0.0–5.0)
HCT: 47.5 % — ABNORMAL HIGH (ref 36.0–46.0)
Hemoglobin: 15.7 g/dL — ABNORMAL HIGH (ref 12.0–15.0)
Lymphocytes Relative: 25.4 % (ref 12.0–46.0)
Monocytes Relative: 7.8 % (ref 3.0–12.0)
Neutro Abs: 5.2 10*3/uL (ref 1.4–7.7)
RBC: 5.41 Mil/uL — ABNORMAL HIGH (ref 3.87–5.11)
RDW: 13.5 % (ref 11.5–14.6)
WBC: 7.9 10*3/uL (ref 4.5–10.5)

## 2012-08-07 LAB — BASIC METABOLIC PANEL
Calcium: 9.8 mg/dL (ref 8.4–10.5)
GFR: 98.77 mL/min (ref >60.00)
Glucose, Bld: 110 mg/dL — ABNORMAL HIGH (ref 70–99)
Potassium: 3.8 mEq/L (ref 3.5–5.1)
Sodium: 139 mEq/L (ref 135–145)

## 2012-08-07 LAB — LIPID PANEL
Cholesterol: 233 mg/dL — ABNORMAL HIGH (ref 0–200)
VLDL: 21.8 mg/dL (ref 0.0–40.0)

## 2012-08-07 LAB — HEPATIC FUNCTION PANEL
ALT: 21 U/L (ref 0–35)
AST: 21 U/L (ref 0–37)
Albumin: 4.3 g/dL (ref 3.5–5.2)
Alkaline Phosphatase: 64 U/L (ref 39–117)

## 2012-08-07 LAB — LDL CHOLESTEROL, DIRECT: Direct LDL: 173 mg/dL

## 2012-08-07 MED ORDER — CEFUROXIME AXETIL 500 MG PO TABS: 500.0000 mg | ORAL_TABLET | Freq: Two times a day (BID) | ORAL | Status: DC

## 2012-08-07 NOTE — Patient Instructions (Signed)
Will notify you  of labs when available. You seem to have a uti . Will treat for this  And also do a PT referral  [facility] . Will look into the issue with colonoscopy .  And get a referral .    Contact us  If the back  And stiffness is not better in 2 weeks or  After the infection is treated .  Will call in antibiotic for th uti today . We can change therapy if needed. . Than plan . Fu .

## 2012-08-07 NOTE — Progress Notes (Signed)
Chief Complaint  Patient presents with  . Annual Exam    Medicare disease managment    HPI: Patient comes in today for Preventive Medicare wellness visit . Here with daughter No major injuries, ed visits ,hospitalizations , new medications since last visit.  [facility] urologist hasn't seen him in 6 months sees Dr. Dierdre Forth rheumatologist for polymyalgia rheumatica and is still on prednisone. Sees Dr. Karna Dupes on a regular basis. Lab work was done by Dr. Dierdre Forth not in the EHR    Hearing:  No significant change  Vision:  No limitations at present . Last eye check UTD  Safety:  Has smoke detector and wears seat belts.  No firearms. No excess sun exposure. Sees dentist regularly.  Falls:  No is cautious  Advance directive :  Reviewed  Has one.  Memory:  About the same is on Aricept nose directions on familiar routes has a good routine at home drives still but only on familiar bruits can do ADLs.  Depression: No anhedonia unusual crying or depressive symptoms  Nutrition: Eats well balanced diet; adequate calcium and vitamin D. No swallowing chewing problems.  Injury: no major injuries in the last six months.  Other healthcare providers:  Reviewed today . Demetrio Lapping  and [place]  Social:   At friends home.  Preventive parameters: up-to-date  Reviewed think she has had the pneumonia shot but not documented in the record paper record is not available today ;she is now 78 years old who believes she had this  ADLS:   There are no problems or need for assistance  driving, feeding, obtaining food, dressing, toileting and bathing, managing money with assistance otherwise using phone.  EXERCISE/ HABITS  Per week   No tobacco    etoh 5 ounces. Walks and is active   ROS:  GEN/ HEENT: No fever, significant weight changes sweats headaches vision problems hearing changes, CV/ PULM; No chest pain shortness of breath cough, syncope,edema  change in exercise tolerance. GI /GU: No adominal pain,  vomiting, change in bowel habits.  Has had a couple episodes of blood near the stool not mixed in felt to be from a hard bowel movement but no associated abdominal pain uncertain cause.  Having nocturia x2 has some lower back and groin pain when she gets up in the morning is very stiff and has a hard time getting to the bathroom and has recent incontinence. Has tried using a walker at that time concern about falling and ends up with incontinence episodes. As the day goes on her gait gets better and the pain goes away. SKIN/HEME: ,no acute skin rashes suspicious lesions or bleeding. No lymphadenopathy, nodules, masses.  NEURO/ PSYCH:  Noweakness numbness. No depression anxiety. Seems to be controlled by the Zoloft IMM/ Allergy: No unusual infections.  Allergy .   REST of 12 system review negative except as per HPI   Past Medical History  Diagnosis Date  . Cellulitis of left leg   . Eczema   . Hypertension   . Mild cognitive impairment   . Osteoarthritis   . Polymyalgia rheumatica     rx with prednisone  . Varicose veins   . Xerosis of skin   . Urinary incontinence   . History of renal stone     excision    Family History  Problem Relation Age of Onset  . Melanoma Mother     53  . Stroke Father     95  . Osteoporosis Other  History   Social History  . Marital Status: Widowed    Spouse Name: N/A    Number of Children: N/A  . Years of Education: N/A   Social History Main Topics  . Smoking status: Former Smoker    Start date: 08/07/1944    Quit date: 10/13/1945  . Smokeless tobacco: Former Neurosurgeon    Quit date: 10/01/1945  . Alcohol Use: Yes  . Drug Use: None  . Sexually Active: None   Other Topics Concern  . None   Social History Narrative   Widowed remote hx of tobacco x 4 yearsLiving at friends home.G4P3Daughters have HCPOA  If needed    Outpatient Encounter Prescriptions as of 08/07/2012  Medication Sig Dispense Refill  . Ascorbic Acid (VITAMIN C) 100 MG  tablet Take 100 mg by mouth daily.        Marland Kitchen atenolol-chlorthalidone (TENORETIC) 50-25 MG per tablet TAKE 1 TABLET BY MOUTH DAILY.  30 tablet  2  . calcium carbonate (OS-CAL) 1250 MG chewable tablet Chew 1 tablet by mouth daily.      . cholecalciferol (VITAMIN D) 1000 UNITS tablet Take 1,000 Units by mouth daily.        Marland Kitchen donepezil (ARICEPT) 10 MG tablet       . MULTIPLE VITAMIN PO Take by mouth.        . predniSONE (DELTASONE) 1 MG tablet Take 2 mg by mouth daily.       . sertraline (ZOLOFT) 50 MG tablet TAKE 1 TABLET EVERY DAY  30 tablet  2  . solifenacin (VESICARE) 10 MG tablet Take 10 mg by mouth daily.       . [DISCONTINUED] donepezil (ARICEPT) 10 MG tablet TAKE 1/2 TABLET BY MOUTH EVERY DAY AND INCREASE TO 1 TABLET AFTER A FEW WEEKS  30 tablet  3  . cefUROXime (CEFTIN) 500 MG tablet Take 1 tablet (500 mg total) by mouth 2 (two) times daily. For uti  14 tablet  0  . [DISCONTINUED] donepezil (ARICEPT) 10 MG tablet         EXAM:  BP 120/70  Pulse 59  Temp 97.5 F (36.4 C) (Oral)  Ht 5' 4.25" (1.632 m)  Wt 170 lb (77.111 kg)  BMI 28.95 kg/m2  SpO2 88%  Body mass index is 28.95 kg/(m^2). Wt Readings from Last 3 Encounters:  08/07/12 170 lb (77.111 kg)  07/04/12 172 lb (78.019 kg)  12/11/11 172 lb (78.019 kg)    Physical Exam: Vital signs reviewed ZOX:WRUE is a well-developed well-nourished alert cooperative   who appears stated age in no acute distress.  HEENT: normocephalic atraumatic , Eyes: PERRL EOM's full, conjunctiva clear, Nares: paten,t no deformity discharge or tenderness., Ears: no deformity EAC's clear TMs with normal landmarks. Mouth: clear OP, no lesions, edema.  Moist mucous membranes. Dentition in adequate repair. NECK: supple without masses, thyromegaly or bruits. CHEST/PULM:  Clear to auscultation and percussion breath sounds equal no wheeze , rales or rhonchi. No chest wall deformities or tenderness. Breast: normal by inspection . No dimpling, discharge,  masses, tenderness or discharge .  CV: PMI is nondisplaced, S1 S2 no gallops, murmurs, rubs. Peripheral pulses are full without delay.No JVD .  ABDOMEN: Bowel sounds normal nontender  No guard or rebound, no hepato splenomegal no CVA tenderness.  No hernia. Extremtities:  No clubbing cyanosis or edema, no acute joint swelling or redness no focal atrophy NEURO:  Oriented x3, cranial nerves 3-12 appear to be intact, no obvious focal weakness,gait within normal  limits no abnormal reflexes or asymmetrical SKIN: No acute rashes normal turgor, color, no bruising or petechiae. Lower extremity with chronic changes no ulceration. PSYCH: Oriented, to person and place season good eye contact, no obvious depression anxiety,  judgment appear normal. Has some memory difficulties LN: no cervical axillary inguinal adenopathy No noted deficits in memory, attention, and speech.   Lab Results  Component Value Date   WBC 7.9 08/07/2012   HGB 15.7* 08/07/2012   HCT 47.5* 08/07/2012   PLT 277.0 08/07/2012   GLUCOSE 110* 08/07/2012   CHOL 233* 08/07/2012   TRIG 109.0 08/07/2012   HDL 48.90 08/07/2012   LDLDIRECT 173.0 08/07/2012   LDLCALC 117* 04/24/2011   ALT 21 08/07/2012   AST 21 08/07/2012   NA 139 08/07/2012   K 3.8 08/07/2012   CL 99 08/07/2012   CREATININE 0.6 08/07/2012   BUN 25* 08/07/2012   CO2 31 08/07/2012   TSH 0.57 08/07/2012   INR 1.0 ratio 12/07/2008    ASSESSMENT AND PLAN:  Discussed the following assessment and plan:  1. Medicare annual wellness visit, subsequent    2. Incontinence  donepezil (ARICEPT) 10 MG tablet, POC Urinalysis Dipstick, Basic metabolic panel, CBC with Differential, Hepatic function panel, Lipid panel, TSH, Ambulatory referral to Physical Therapy   Worsening probably from UTI  3. MILD COGNITIVE IMPAIRMENT SO STATED  donepezil (ARICEPT) 10 MG tablet, POC Urinalysis Dipstick, Basic metabolic panel, CBC with Differential, Hepatic function panel, Lipid panel, TSH    Mild progression still pretty independent. Supportive family  4. HYPERTENSION  donepezil (ARICEPT) 10 MG tablet, POC Urinalysis Dipstick, Basic metabolic panel, CBC with Differential, Hepatic function panel, Lipid panel, TSH  5. OSTEOARTHRITIS  donepezil (ARICEPT) 10 MG tablet, POC Urinalysis Dipstick, Basic metabolic panel, CBC with Differential, Hepatic function panel, Lipid panel, TSH  6. Polymyalgia rheumatica  donepezil (ARICEPT) 10 MG tablet, POC Urinalysis Dipstick, Basic metabolic panel, CBC with Differential, Hepatic function panel, Lipid panel, TSH   on steroids  7. Prolonged depressive reaction  donepezil (ARICEPT) 10 MG tablet, POC Urinalysis Dipstick, Basic metabolic panel, CBC with Differential, Hepatic function panel, Lipid panel, TSH   Improved on Zoloft continue  8. Lower back pain  donepezil (ARICEPT) 10 MG tablet, POC Urinalysis Dipstick, Basic metabolic panel, CBC with Differential, Hepatic function panel, Lipid panel, TSH, Ambulatory referral to Physical Therapy   In the morning possibly from UTI problems with getting to the bathroom may need help with walker other difficulties we'll do physical therapy referral friend's   9. UTI (lower urinary tract infection)  Urine culture, Urine culture  10. Rectal bleeding hx  Ambulatory referral to Gastroenterology   History sounds like   hemorrhoids not currently a problem, we'll get GI to for plan c;annot tell her last colonoscopy was. from EHR   Laboratory studies do we'll get today. Is not fasting  But ok to get. Unfortunately notes from Dr. Dierdre Forth or not in the record under media we'll have to call her office and get the last 2 visits. Patient Care Team: Madelin Headings, MD as PCP - General Evie Lacks, MD (Neurology) Donnetta Hail, MD (Rheumatology) Dorothyann Gibbs, MD (Ophthalmology)  Patient Instructions  Will notify you  of labs when available. You seem to have a uti . Will treat for this  And also do a PT referral   [facility] . Will look into the issue with colonoscopy .  And get a referral .    Contact us  If the back  And stiffness is not better in 2 weeks or  After the infection is treated .  Will call in antibiotic for th uti today . We can change therapy if needed. . Than plan . Fu .        [name]. Naidelin Gugliotta M.D.

## 2012-08-09 ENCOUNTER — Encounter: Payer: Self-pay | Admitting: Internal Medicine

## 2012-08-09 LAB — URINE CULTURE

## 2012-08-15 ENCOUNTER — Other Ambulatory Visit: Payer: Self-pay | Admitting: Internal Medicine

## 2012-08-17 ENCOUNTER — Other Ambulatory Visit: Payer: Self-pay | Admitting: Internal Medicine

## 2012-08-19 ENCOUNTER — Other Ambulatory Visit: Payer: Self-pay | Admitting: Family Medicine

## 2012-08-19 DIAGNOSIS — K625 Hemorrhage of anus and rectum: Secondary | ICD-10-CM

## 2012-08-19 DIAGNOSIS — M1990 Unspecified osteoarthritis, unspecified site: Secondary | ICD-10-CM

## 2012-08-19 DIAGNOSIS — M545 Low back pain: Secondary | ICD-10-CM

## 2012-08-19 DIAGNOSIS — F329 Major depressive disorder, single episode, unspecified: Secondary | ICD-10-CM

## 2012-08-19 DIAGNOSIS — G3184 Mild cognitive impairment, so stated: Secondary | ICD-10-CM

## 2012-08-19 DIAGNOSIS — N390 Urinary tract infection, site not specified: Secondary | ICD-10-CM

## 2012-08-19 DIAGNOSIS — R32 Unspecified urinary incontinence: Secondary | ICD-10-CM

## 2012-08-19 DIAGNOSIS — M353 Polymyalgia rheumatica: Secondary | ICD-10-CM

## 2012-08-19 DIAGNOSIS — Z0000 Encounter for general adult medical examination without abnormal findings: Secondary | ICD-10-CM

## 2012-08-19 DIAGNOSIS — I10 Essential (primary) hypertension: Secondary | ICD-10-CM

## 2012-08-29 ENCOUNTER — Encounter: Payer: Self-pay | Admitting: Internal Medicine

## 2012-09-03 ENCOUNTER — Encounter: Payer: Self-pay | Admitting: Internal Medicine

## 2012-09-03 ENCOUNTER — Ambulatory Visit (INDEPENDENT_AMBULATORY_CARE_PROVIDER_SITE_OTHER): Payer: Medicare Other | Admitting: Internal Medicine

## 2012-09-03 VITALS — BP 138/78 | HR 84 | Ht 64.0 in | Wt 175.4 lb

## 2012-09-03 DIAGNOSIS — K625 Hemorrhage of anus and rectum: Secondary | ICD-10-CM

## 2012-09-03 NOTE — Patient Instructions (Addendum)
Call our office if any rectal bleeding occurs   [phone]  Follow up with Dr. Rhea Belton as needed

## 2012-09-03 NOTE — Progress Notes (Signed)
Patient ID: Tricia Mendez, female   DOB: 02/04/1924, 79 y.o.   MRN: [id number]  SUBJECTIVE: HPI Tricia Mendez is an 79 year old female with a past medical history of PMR, renal stones status post excision, hypertension, mild cognitive impairment who seen in consultation at the request of Panosh for evaluation of isolated rectal bleeding. The patient is accompanied today by her daughter. The patient reported about 2 months ago developing one episode of bright red blood per rectum. This was noticed primarily on the toilet paper. It also is associated with perianal pain. She reports this only occurred once, and has not recurred since. She also recalls her stool being hard around the time of the bleeding. She denies abdominal pain. No diarrhea or constipation. No other change in bowel habits. No nausea or vomiting. Appetite is good and weight is stable.  She recalls colonoscopy performed "about 10 years ago" which was "clean"  Review of Systems  As per history of present illness, otherwise negative   Past Medical History  Diagnosis Date  . Cellulitis of left leg   . Eczema   . Hypertension   . Mild cognitive impairment   . Osteoarthritis   . Polymyalgia rheumatica     rx with prednisone  . Varicose veins   . Xerosis of skin   . Urinary incontinence   . History of renal stone     excision    Current Outpatient Prescriptions  Medication Sig Dispense Refill  . Ascorbic Acid (VITAMIN C) 100 MG tablet Take 100 mg by mouth daily.        Marland Kitchen atenolol-chlorthalidone (TENORETIC) 50-25 MG per tablet TAKE 1 TABLET BY MOUTH DAILY.  30 tablet  2  . calcium carbonate (OS-CAL) 1250 MG chewable tablet Chew 1 tablet by mouth daily.      . cefUROXime (CEFTIN) 500 MG tablet Take 1 tablet (500 mg total) by mouth 2 (two) times daily. For uti  14 tablet  0  . cholecalciferol (VITAMIN D) 1000 UNITS tablet Take 1,000 Units by mouth daily.        Marland Kitchen donepezil (ARICEPT) 10 MG tablet       . donepezil (ARICEPT) 10 MG  tablet TAKE 1/2 TABLET BY MOUTH EVERY DAY AND INCREASE TO 1 TABLET AFTER A FEW WEEKS  30 tablet  3  . MULTIPLE VITAMIN PO Take by mouth.        . predniSONE (DELTASONE) 1 MG tablet Take 2 mg by mouth daily.       . sertraline (ZOLOFT) 50 MG tablet TAKE 1 TABLET EVERY DAY  30 tablet  2  . solifenacin (VESICARE) 10 MG tablet Take 10 mg by mouth daily.         Allergies  Allergen Reactions  . Myrbetriq ([employer])     Dizzy foggy cns    Family History  Problem Relation Age of Onset  . Melanoma Mother     8  . Stroke Father     34  . Osteoporosis Other     History  Substance Use Topics  . Smoking status: Former Smoker    Start date: 08/07/1944    Quit date: 10/13/1945  . Smokeless tobacco: Former Neurosurgeon    Quit date: 10/01/1945  . Alcohol Use: Yes    OBJECTIVE: BP 138/78  Pulse 84  Ht 5\' 4"  (1.626 m)  Wt 175 lb 6.4 oz (79.561 kg)  BMI 30.11 kg/m2 Constitutional: Well-developed and well-nourished. No distress. HEENT: Normocephalic and atraumatic. Oropharynx is clear  and moist. No oropharyngeal exudate. Conjunctivae are normal. No scleral icterus. Neck: Neck supple. Trachea midline. Cardiovascular: Normal rate, regular rhythm and intact distal pulses. No M/R/G Pulmonary/chest: Effort normal and breath sounds normal. No wheezing, rales or rhonchi. Abdominal: Soft, nontender, nondistended. Well-healed lower abdominal scar Bowel sounds active throughout.  Extremities: no clubbing, cyanosis, or edema Lymphadenopathy: No cervical adenopathy noted. Neurological: Alert and oriented to person place and time. Skin: Skin is warm and dry. No rashes noted. Psychiatric: Normal mood and affect. Behavior is normal.  Labs and Imaging -- CBC    Component Value Date/Time   WBC 7.9 08/07/2012 1305   RBC 5.41* 08/07/2012 1305   HGB 15.7* 08/07/2012 1305   HGB 17.2 07/10/2011 1724   HCT 47.5* 08/07/2012 1305   PLT 277.0 08/07/2012 1305   MCV 87.8 08/07/2012 1305   MCHC 33.0  08/07/2012 1305   RDW 13.5 08/07/2012 1305   LYMPHSABS 2.0 08/07/2012 1305   MONOABS 0.6 08/07/2012 1305   EOSABS 0.1 08/07/2012 1305   BASOSABS 0.0 08/07/2012 1305    CMP     Component Value Date/Time   NA 139 08/07/2012 1305   K 3.8 08/07/2012 1305   CL 99 08/07/2012 1305   CO2 31 08/07/2012 1305   GLUCOSE 110* 08/07/2012 1305   BUN 25* 08/07/2012 1305   CREATININE 0.6 08/07/2012 1305   CALCIUM 9.8 08/07/2012 1305   CALCIUM 10.1 04/06/2010 2146   PROT 7.3 08/07/2012 1305   ALBUMIN 4.3 08/07/2012 1305   AST 21 08/07/2012 1305   ALT 21 08/07/2012 1305   ALKPHOS 64 08/07/2012 1305   BILITOT 0.8 08/07/2012 1305   GFRNONAA 90.71 03/08/2010 0935   GFRAA  Value: >60        The eGFR has been calculated using the MDRD equation. This calculation has not been validated in all clinical situations. eGFR's persistently <60 mL/min signify possible Chronic Kidney Disease. 02/08/2009 0626   Colonoscopy dated 12/02/2004 performed by Dr. Carman Ching Assessment: 1. Nonspecific proctitis probably due to mild rectal prolapse 2. Internal hemorrhoids (exam was to the cecum)  ASSESSMENT AND PLAN: 79 year old female with a past medical history of PMR, renal stones status post excision, hypertension, mild cognitive impairment who seen in consultation at the request of Panosh for evaluation of isolated rectal bleeding.   1.  Single episode of BRBPR -- the patient's episode of rectal bleeding occurred only once and has not recurred. Her symptoms could be consistent with hemorrhoids or anal fissure. It is reassuring that she had a screening colonoscopy without polyps performed in April 2006.  At that time she also had some distal proctitis, which was mild and felt possibly secondary to prolapse.  Certainly this could bleed, but would be expected to bleed more frequently than an isolated episode. This would also likely have been painless.  We discussed evaluating this further including colonoscopy or flexible  sigmoidoscopy versus observation. She understands that without direct visualization I cannot exclude more serious pathology such as polyp or tumor, but this is felt to be very unlikely.  At this point both she and her daughter preferred the wait and see approach. Her hemoglobin was checked and normal which is also reassuring. She is asked to call our office should any rectal bleeding recur, and she voices understanding.  If bleeding does recur, we would likely pursue flexible sigmoidoscopy. At this point she does not seem to struggle with constipation, and therefore no laxative was initiated.

## 2012-09-11 ENCOUNTER — Other Ambulatory Visit: Payer: Self-pay | Admitting: Internal Medicine

## 2012-09-30 ENCOUNTER — Ambulatory Visit (INDEPENDENT_AMBULATORY_CARE_PROVIDER_SITE_OTHER): Payer: Medicare Other | Admitting: Family Medicine

## 2012-09-30 ENCOUNTER — Encounter: Payer: Self-pay | Admitting: Family Medicine

## 2012-09-30 VITALS — BP 148/80 | HR 90 | Temp 98.4°F | Ht 65.0 in | Wt 172.0 lb

## 2012-09-30 DIAGNOSIS — R21 Rash and other nonspecific skin eruption: Secondary | ICD-10-CM

## 2012-09-30 MED ORDER — NYSTATIN-TRIAMCINOLONE 100000-0.1 UNIT/GM-% EX OINT: TOPICAL_OINTMENT | Freq: Two times a day (BID) | CUTANEOUS | Status: DC

## 2012-09-30 NOTE — Progress Notes (Signed)
Chief Complaint  Patient presents with  . Herpes Zoster    right breast    HPI:   Acute visit for skin rash: -started 2 days ago -started with itching and then developed rash on on chest -does not hurt at all - is very itchy -spoke to daughter on the phone who said she had had some new shirts lately that she wore without washing -denies: pain, fever, chills, malaise, recent illness   ROS: See pertinent positives and negatives per HPI.  Past Medical History  Diagnosis Date  . Cellulitis of left leg   . Eczema   . Hypertension   . Mild cognitive impairment   . Osteoarthritis   . Polymyalgia rheumatica     rx with prednisone  . Varicose veins   . Xerosis of skin   . Urinary incontinence   . History of renal stone     excision    Family History  Problem Relation Age of Onset  . Melanoma Mother     47  . Stroke Father     65  . Osteoporosis Other     History   Social History  . Marital Status: Widowed    Spouse Name: N/A    Number of Children: N/A  . Years of Education: N/A   Occupational History  . retired Runner, broadcasting/film/video    Social History Main Topics  . Smoking status: Former Smoker    Start date: 08/07/1944    Quit date: 10/13/1945  . Smokeless tobacco: Former Neurosurgeon    Quit date: 10/01/1945  . Alcohol Use: Yes  . Drug Use: No  . Sexually Active: None   Other Topics Concern  . None   Social History Narrative   Widowed remote hx of tobacco x 4 yearsLiving at friends home.G4P3Daughters have HCPOA  If needed    Current outpatient prescriptions:Ascorbic Acid (VITAMIN C) 100 MG tablet, Take 100 mg by mouth daily.  , Disp: , Rfl: ;  atenolol-chlorthalidone (TENORETIC) 50-25 MG per tablet, TAKE 1 TABLET BY MOUTH DAILY., Disp: 30 tablet, Rfl: 5;  calcium carbonate (OS-CAL) 1250 MG chewable tablet, Chew 1 tablet by mouth daily., Disp: , Rfl: ;  cholecalciferol (VITAMIN D) 1000 UNITS tablet, Take 1,000 Units by mouth daily.  , Disp: , Rfl:  donepezil (ARICEPT) 10  MG tablet, , Disp: , Rfl: ;  donepezil (ARICEPT) 10 MG tablet, TAKE 1/2 TABLET BY MOUTH EVERY DAY AND INCREASE TO 1 TABLET AFTER A FEW WEEKS, Disp: 30 tablet, Rfl: 3;  MULTIPLE VITAMIN PO, Take by mouth.  , Disp: , Rfl: ;  predniSONE (DELTASONE) 1 MG tablet, Take 2 mg by mouth daily. , Disp: , Rfl: ;  sertraline (ZOLOFT) 50 MG tablet, TAKE 1 TABLET EVERY DAY, Disp: 30 tablet, Rfl: 2 cefUROXime (CEFTIN) 500 MG tablet, Take 1 tablet (500 mg total) by mouth 2 (two) times daily. For uti, Disp: 14 tablet, Rfl: 0;  nystatin-triamcinolone ointment (MYCOLOG), Apply topically 2 (two) times daily., Disp: 30 g, Rfl: 0;  solifenacin (VESICARE) 10 MG tablet, Take 10 mg by mouth daily. , Disp: , Rfl:   EXAM:  Filed Vitals:   09/30/12 1513  BP: 148/80  Pulse: 90  Temp: 98.4 F (36.9 C)    Body mass index is 28.62 kg/(m^2).  GENERAL: vitals reviewed and listed above, alert, oriented, appears well hydrated and in no acute distress  HEENT: atraumatic, conjunttiva clear, no obvious abnormalities on inspection of external nose and ears  NECK: no obvious masses on  inspection  LUNGS: clear to auscultation bilaterally, no wheezes, rales or rhonchi, good air movement  CV: HRRR, no peripheral edema  MS: moves all extremities without noticeable abnormality  SKIN: erythematous maculopapular rash on upper chest bilaterally  PSYCH: pleasant and cooperative, no obvious depression or anxiety  ASSESSMENT AND PLAN:  Discussed the following assessment and plan:  1. Skin rash  nystatin-triamcinolone ointment (MYCOLOG)   -likely contact dermatitis related to recent new shirts - somewhat yeast like in appearance but may be due to scratching -mycolog and discussed with daughter per phone - hypoallergenic soaps, wash all clothes before wearing, etc and return precuations -follow up in 1 week to recheck -Patient advised to return or notify a doctor immediately if symptoms worsen or persist or new concerns  arise.  There are no Patient Instructions on file for this visit.   Kriste Basque R.

## 2012-09-30 NOTE — Patient Instructions (Addendum)
-  no harsh soaps or detergents - wash all clothes in hypoallergenic detergent before wearing  -Mycolog twice daily   -follow up in 1 week with your doctor

## 2012-10-08 ENCOUNTER — Encounter: Payer: Self-pay | Admitting: Internal Medicine

## 2012-10-08 ENCOUNTER — Ambulatory Visit (INDEPENDENT_AMBULATORY_CARE_PROVIDER_SITE_OTHER): Payer: Medicare Other | Admitting: Internal Medicine

## 2012-10-08 VITALS — BP 140/70 | HR 97 | Temp 98.9°F

## 2012-10-08 DIAGNOSIS — R32 Unspecified urinary incontinence: Secondary | ICD-10-CM

## 2012-10-08 DIAGNOSIS — R3915 Urgency of urination: Secondary | ICD-10-CM

## 2012-10-08 DIAGNOSIS — L259 Unspecified contact dermatitis, unspecified cause: Secondary | ICD-10-CM

## 2012-10-08 DIAGNOSIS — N390 Urinary tract infection, site not specified: Secondary | ICD-10-CM

## 2012-10-08 DIAGNOSIS — I10 Essential (primary) hypertension: Secondary | ICD-10-CM

## 2012-10-08 DIAGNOSIS — J988 Other specified respiratory disorders: Secondary | ICD-10-CM

## 2012-10-08 DIAGNOSIS — J22 Unspecified acute lower respiratory infection: Secondary | ICD-10-CM

## 2012-10-08 LAB — POCT URINALYSIS DIPSTICK
Bilirubin, UA: NEGATIVE
Glucose, UA: NEGATIVE
Ketones, UA: NEGATIVE
Leukocytes, UA: NEGATIVE
Protein, UA: NEGATIVE
Spec Grav, UA: 1.025

## 2012-10-08 MED ORDER — CEFUROXIME AXETIL 500 MG PO TABS: 500.0000 mg | ORAL_TABLET | Freq: Two times a day (BID) | ORAL | Status: DC

## 2012-10-08 NOTE — Progress Notes (Signed)
Chief Complaint  Patient presents with  . Follow-up    Daughter believes she has a UTI    HPI: Patient comes in today for follow up of  multiple medical problems.  Here with daughter . Since her last visit they've been using a cortisone rash on the chest which is getting better. Working diagnosis is a contact dermatitis. New rashes She's developed an upper respiratory congestion cold but cough and feeling tight in the chest difficulty sleeping at night. No fever and chills. No hemoptysis. Urinary incontinence has gotten worse recently with an odor to urine and abnormal color daughter feels that the urine is infected. Her last antibiotic was in December Ceftin and symptoms got better at that time.  No unusual bruising bleeding or falling memory is about the same. ROS: See pertinent positives and negatives per HPI.  Past Medical History  Diagnosis Date  . Cellulitis of left leg   . Eczema   . Hypertension   . Mild cognitive impairment   . Osteoarthritis   . Polymyalgia rheumatica     rx with prednisone  . Varicose veins   . Xerosis of skin   . Urinary incontinence   . History of renal stone     excision    Family History  Problem Relation Age of Onset  . Melanoma Mother     45  . Stroke Father     49  . Osteoporosis Other     History   Social History  . Marital Status: Widowed    Spouse Name: N/A    Number of Children: N/A  . Years of Education: N/A   Occupational History  . retired Runner, broadcasting/film/video    Social History Main Topics  . Smoking status: Former Smoker    Start date: 08/07/1944    Quit date: 10/13/1945  . Smokeless tobacco: Former Neurosurgeon    Quit date: 10/01/1945  . Alcohol Use: Yes  . Drug Use: No  . Sexually Active: None   Other Topics Concern  . None   Social History Narrative   Widowed remote hx of tobacco x 4 years   Living at [facility].   G4P3   Daughters have HCPOA  If needed             Outpatient Encounter Prescriptions as of  10/08/2012  Medication Sig Dispense Refill  . Ascorbic Acid (VITAMIN C) 100 MG tablet Take 100 mg by mouth daily.        Marland Kitchen atenolol-chlorthalidone (TENORETIC) 50-25 MG per tablet TAKE 1 TABLET BY MOUTH DAILY.  30 tablet  5  . calcium carbonate (OS-CAL) 1250 MG chewable tablet Chew 1 tablet by mouth daily.      . cholecalciferol (VITAMIN D) 1000 UNITS tablet Take 1,000 Units by mouth daily.        Marland Kitchen donepezil (ARICEPT) 10 MG tablet       . MULTIPLE VITAMIN PO Take by mouth.        . nystatin-triamcinolone ointment (MYCOLOG) Apply topically 2 (two) times daily.  30 g  0  . predniSONE (DELTASONE) 1 MG tablet Take 2 mg by mouth daily.       . sertraline (ZOLOFT) 50 MG tablet TAKE 1 TABLET EVERY DAY  30 tablet  2  . solifenacin (VESICARE) 10 MG tablet Take 10 mg by mouth daily.       . [DISCONTINUED] donepezil (ARICEPT) 10 MG tablet TAKE 1/2 TABLET BY MOUTH EVERY DAY AND INCREASE TO 1 TABLET AFTER A  FEW WEEKS  30 tablet  3  . cefUROXime (CEFTIN) 500 MG tablet Take 1 tablet (500 mg total) by mouth 2 (two) times daily.  14 tablet  0  . [DISCONTINUED] cefUROXime (CEFTIN) 500 MG tablet Take 1 tablet (500 mg total) by mouth 2 (two) times daily. For uti  14 tablet  0  . [DISCONTINUED] donepezil (ARICEPT) 10 MG tablet        No facility-administered encounter medications on file as of 10/08/2012.    EXAM:  BP 140/70  Pulse 97  Temp(Src) 98.9 F (37.2 C) (Oral)  SpO2 97%  Body mass index is 0.00 kg/(m^2).  G WDWN in NAD  quiet respirations; mildly congested  somewhat hoarse. Non toxic . Drippy nose  HEENT: Normocephalic ;atraumatic , Eyes;  PERRL, EOMs  Full, lids and conjunctiva clear,,Ears: no deformities, canals nl, TM landmarks normal, Nose: no deformity or discharge but congested;face  non tender Mouth : OP clear without lesion or edema . Neck: Supple without adenopathy or masses or bruits Chest:  Clear to A&P without wheezes rales or rhonchi few course bs at bases  Coughing bronchial  CV:   S1-S2 no gallops or murmurs peripheral perfusion is normal Skin :nl perfusion and chest rash pink in exposed l;ocation no vesicle  MS: moves all extremities without noticeable focal  abnormality PSYCH: pleasant and cooperative, no obvious depression or anxiety looks to daughter for memory cures but cooperative and pleasant.   ASSESSMENT AND PLAN:  Discussed the following assessment and plan:  1. Urinary incontinence  POC Urinalysis Dipstick   POC Urinalysis Dipstick   urine looks abnormal  odor and thick   2. Urinary urgency  POC Urinalysis Dipstick   POC Urinalysis Dipstick  3. Urinary tract infection, site not specified  Urine culture   Urine culture  4. DERMATITIS     improving call for refill cream if needed  5. HYPERTENSION     better on repeat follow only taking 1/2 medication  6. Acute respiratory infection     expectant management  fu with alarm sx     -Patient advised to return or notify health care team  if symptoms worsen or persist or new concerns arise.  Patient Instructions  We are treating you for UTI and poss bacterial chest infection. Contact us  If not getting better in 3-5 days.      Cough can stay for another week or so but should feel much better  Plain mucinex is safe to loosen up cough but  not  curative.       [name]. Haniyyah Sakuma M.D.

## 2012-10-08 NOTE — Patient Instructions (Addendum)
We are treating you for UTI and poss bacterial chest infection. Contact us  If not getting better in 3-5 days.      Cough can stay for another week or so but should feel much better  Plain mucinex is safe to loosen up cough but  not  curative.

## 2012-11-07 ENCOUNTER — Other Ambulatory Visit: Payer: Self-pay | Admitting: Internal Medicine

## 2012-12-28 ENCOUNTER — Other Ambulatory Visit: Payer: Self-pay | Admitting: Internal Medicine

## 2012-12-30 ENCOUNTER — Telehealth: Payer: Self-pay | Admitting: Internal Medicine

## 2012-12-30 MED ORDER — DONEPEZIL HCL 10 MG PO TABS: 10.0000 mg | ORAL_TABLET | Freq: Every day | ORAL | Status: DC

## 2012-12-30 NOTE — Telephone Encounter (Signed)
Pt called to request a refill of her donepezil (ARICEPT) 10 MG tablet, she would like it called in to the [facility] at [address]. Please assist.

## 2012-12-30 NOTE — Telephone Encounter (Signed)
Sent by e-scribe. 

## 2013-01-23 ENCOUNTER — Other Ambulatory Visit: Payer: Self-pay | Admitting: Internal Medicine

## 2013-03-24 ENCOUNTER — Emergency Department (HOSPITAL_COMMUNITY): Payer: Medicare Other

## 2013-03-24 ENCOUNTER — Emergency Department (HOSPITAL_COMMUNITY)
Admission: EM | Admit: 2013-03-24 | Discharge: 2013-03-24 | Disposition: A | Discharge status: Home / Self Care | Payer: Medicare Other | Attending: Emergency Medicine | Admitting: Emergency Medicine

## 2013-03-24 ENCOUNTER — Encounter (HOSPITAL_COMMUNITY): Payer: Self-pay

## 2013-03-24 DIAGNOSIS — Z872 Personal history of diseases of the skin and subcutaneous tissue: Secondary | ICD-10-CM | POA: Insufficient documentation

## 2013-03-24 DIAGNOSIS — I10 Essential (primary) hypertension: Secondary | ICD-10-CM | POA: Insufficient documentation

## 2013-03-24 DIAGNOSIS — Z87442 Personal history of urinary calculi: Secondary | ICD-10-CM | POA: Insufficient documentation

## 2013-03-24 DIAGNOSIS — Z8669 Personal history of other diseases of the nervous system and sense organs: Secondary | ICD-10-CM | POA: Insufficient documentation

## 2013-03-24 DIAGNOSIS — N390 Urinary tract infection, site not specified: Secondary | ICD-10-CM | POA: Insufficient documentation

## 2013-03-24 DIAGNOSIS — R071 Chest pain on breathing: Secondary | ICD-10-CM | POA: Insufficient documentation

## 2013-03-24 DIAGNOSIS — Z8739 Personal history of other diseases of the musculoskeletal system and connective tissue: Secondary | ICD-10-CM | POA: Insufficient documentation

## 2013-03-24 DIAGNOSIS — IMO0002 Reserved for concepts with insufficient information to code with codable children: Secondary | ICD-10-CM | POA: Insufficient documentation

## 2013-03-24 DIAGNOSIS — R0789 Other chest pain: Secondary | ICD-10-CM

## 2013-03-24 DIAGNOSIS — Z8679 Personal history of other diseases of the circulatory system: Secondary | ICD-10-CM | POA: Insufficient documentation

## 2013-03-24 DIAGNOSIS — Z87891 Personal history of nicotine dependence: Secondary | ICD-10-CM | POA: Insufficient documentation

## 2013-03-24 DIAGNOSIS — Z79899 Other long term (current) drug therapy: Secondary | ICD-10-CM | POA: Insufficient documentation

## 2013-03-24 LAB — BASIC METABOLIC PANEL
BUN: 19 mg/dL (ref 6–23)
CO2: 28 mEq/L (ref 19–32)
Calcium: 10.4 mg/dL (ref 8.4–10.5)
Creatinine, Ser: 0.6 mg/dL (ref 0.50–1.10)
GFR calc non Af Amer: 80 mL/min — ABNORMAL LOW (ref >90)
Glucose, Bld: 106 mg/dL — ABNORMAL HIGH (ref 70–99)
Sodium: 138 mEq/L (ref 135–145)

## 2013-03-24 LAB — CBC
Hemoglobin: 15.5 g/dL — ABNORMAL HIGH (ref 12.0–15.0)
MCH: 29.1 pg (ref 26.0–34.0)
MCHC: 33.8 g/dL (ref 30.0–36.0)
MCV: 86.1 fL (ref 78.0–100.0)
RBC: 5.32 MIL/uL — ABNORMAL HIGH (ref 3.87–5.11)

## 2013-03-24 LAB — URINE MICROSCOPIC-ADD ON

## 2013-03-24 LAB — URINALYSIS, ROUTINE W REFLEX MICROSCOPIC
Bilirubin Urine: NEGATIVE
Glucose, UA: NEGATIVE mg/dL
Hgb urine dipstick: NEGATIVE
Specific Gravity, Urine: 1.022 (ref 1.005–1.030)
Urobilinogen, UA: 0.2 mg/dL (ref 0.0–1.0)
pH: 7 (ref 5.0–8.0)

## 2013-03-24 LAB — POCT I-STAT TROPONIN I

## 2013-03-24 MED ORDER — SULFAMETHOXAZOLE-TRIMETHOPRIM 800-160 MG PO TABS: 1.0000 | ORAL_TABLET | Freq: Two times a day (BID) | ORAL | Status: DC

## 2013-03-24 MED ORDER — HYDROCODONE-ACETAMINOPHEN 5-325 MG PO TABS: 1.0000 | ORAL_TABLET | Freq: Four times a day (QID) | ORAL | Status: DC | PRN

## 2013-03-24 NOTE — ED Provider Notes (Signed)
CSN: [id number]     Arrival date & time 03/24/13  1500 History     First MD Initiated Contact with Patient 03/24/13 1546     Chief Complaint  Patient presents with  . Chest Pain   (Consider location/radiation/quality/duration/timing/severity/associated sxs/prior Treatment) HPI Comments: 79 year old female with past medical history of early dementia, polymyalgia rheumatica and borderline hypertension presents to the ED with chief complaint of left chest pain.  History is given by her daughter.  Daughter states that this morning she called complaining of pain in the left chest.  The pain is only with movement of the left arm.  She complained of having pain when getting up out of her recliner this morning.  Over the [place].  There was multiple area of rough terrain to get to the weight in the patient was held onto on the left side in order to vault falling.  She does have a history of polymyalgia rheumatica.  She has been on chronic daily prednisone use for control of symptoms.Denies DOE, SOB, chest tightness or pressure, radiation to left arm, jaw or back, or diaphoresis.  The patient denies any heat redness or swelling in the joints.  He has had a history previously of herpes zoster but states it is unlike her previous outbreak.  She complains of pain in the left chest wall and back.  It is worse with deep inhalation.  There is no pain at rest.  Nonexertional Denies fevers, chills, myalgias, arthralgias. Denies DOE, SOB, chest tightness or pressure, radiation to left arm, jaw or back, or diaphoresis. Denies dysuria, flank pain, suprapubic pain, frequency, urgency, or hematuria. Denies headaches, light headedness, weakness, visual disturbances. Denies abdominal pain, nausea, vomiting, diarrhea or constipation.    Patient is a 79 y.o. female presenting with chest pain. The history is provided by the patient and a relative. History limited  by: patient with dementia and poor short term memory. daughter gives most of the history.  Chest Pain Pain location:  L lateral chest Pain quality: aching   Pain radiates to:  Does not radiate Pain severity:  Moderate Onset quality:  Gradual (with movement of the arm or raising herself out of sitting postition with arms) Duration: since this moning. Timing:  Intermittent Chronicity:  New Context: breathing, movement and raising an arm   Context: no drug use, not eating, no intercourse, not lifting, not at rest, no stress and no trauma   Relieved by:  None tried Worsened by:  Deep breathing and movement Associated symptoms: no abdominal pain, no AICD problem, no altered mental status, no anorexia, no anxiety, no back pain, no claudication, no cough, no diaphoresis, no dizziness, no dysphagia, no fatigue, no fever, no headache, no heartburn, no lower extremity edema, no nausea, no near-syncope, no numbness, no orthopnea, no palpitations, no PND, no shortness of breath, no syncope, not vomiting and no weakness   Risk factors: no aortic disease, no high cholesterol, no prior DVT/PE and no smoking       Past Medical History  Diagnosis Date  . Cellulitis of left leg   . Eczema   . Hypertension   . Mild cognitive impairment   . Osteoarthritis   . Polymyalgia rheumatica     rx with prednisone  . Varicose veins   . Xerosis of skin   . Urinary incontinence   . History of renal stone     excision   Past Surgical  History  Procedure Laterality Date  . Joint replacement    . Kidney stone surgey    . Cataract surgery    . Lipoma removal     Family History  Problem Relation Age of Onset  . Melanoma Mother     77  . Stroke Father     51  . Osteoporosis Other    History  Substance Use Topics  . Smoking status: Former Smoker    Start date: 08/07/1944    Quit date: 10/13/1945  . Smokeless tobacco: Former Neurosurgeon    Quit date: 10/01/1945  . Alcohol Use: Yes     Comment: social    OB History   Grav Para Term Preterm Abortions TAB SAB Ect Mult Living                 Review of Systems  Constitutional: Negative for fever, diaphoresis and fatigue.  HENT: Negative for trouble swallowing.   Respiratory: Negative for cough and shortness of breath.   Cardiovascular: Positive for chest pain. Negative for palpitations, orthopnea, claudication, syncope, PND and near-syncope.  Gastrointestinal: Negative for heartburn, nausea, vomiting, abdominal pain and anorexia.  Musculoskeletal: Negative for back pain, joint swelling, arthralgias and gait problem.  Skin: Negative for color change and wound.  Neurological: Negative for dizziness, weakness, numbness and headaches.  Psychiatric/Behavioral: Negative for altered mental status.    Allergies  Myrbetriq  Home Medications   Current Outpatient Rx  Name  Route  Sig  Dispense  Refill  . Ascorbic Acid (VITAMIN C) 100 MG tablet   Oral   Take 100 mg by mouth daily.           Marland Kitchen atenolol-chlorthalidone (TENORETIC) 50-25 MG per tablet      TAKE 1 TABLET BY MOUTH DAILY.   30 tablet   5   . atenolol-chlorthalidone (TENORETIC) 50-25 MG per tablet      TAKE 1 TABLET BY MOUTH DAILY.   30 tablet   5   . calcium carbonate (OS-CAL) 1250 MG chewable tablet   Oral   Chew 1 tablet by mouth daily.         . cefUROXime (CEFTIN) 500 MG tablet   Oral   Take 1 tablet (500 mg total) by mouth 2 (two) times daily.   14 tablet   0   . cholecalciferol (VITAMIN D) 1000 UNITS tablet   Oral   Take 1,000 Units by mouth daily.           Marland Kitchen donepezil (ARICEPT) 10 MG tablet   Oral   Take 1 tablet (10 mg total) by mouth at bedtime.   90 tablet   2   . donepezil (ARICEPT) 10 MG tablet      TAKE 1/2 TABLET BY MOUTH EVERY DAY AND INCREASE TO 1 TABLET DAILY AFTER A FEW WEEKS   30 tablet   5   . donepezil (ARICEPT) 10 MG tablet      TAKE 1/2 TABLET BY MOUTH EVERY DAY AND INCREASE TO 1 TABLET AFTER A FEW WEEKS   30 tablet    5   . MULTIPLE VITAMIN PO   Oral   Take by mouth.           . nystatin-triamcinolone ointment (MYCOLOG)   Topical   Apply topically 2 (two) times daily.   30 g   0   . predniSONE (DELTASONE) 1 MG tablet   Oral   Take 2 mg by mouth daily.          Marland Kitchen  sertraline (ZOLOFT) 50 MG tablet      TAKE 1 TABLET EVERY DAY   30 tablet   5   . sertraline (ZOLOFT) 50 MG tablet      TAKE 1 TABLET EVERY DAY   30 tablet   5   . sertraline (ZOLOFT) 50 MG tablet      TAKE 1 TABLET EVERY DAY   30 tablet   5   . solifenacin (VESICARE) 10 MG tablet   Oral   Take 10 mg by mouth daily.           BP 137/57  Pulse 60  Temp(Src) 97.1 F (36.2 C) (Oral)  Resp 20  SpO2 96% Physical Exam  Constitutional: She is oriented to person, place, and time. She appears well-developed and well-nourished. No distress.  HENT:  Head: Normocephalic and atraumatic.  Eyes: Conjunctivae are normal. No scleral icterus.  Neck: Normal range of motion.  Cardiovascular: Normal rate, regular rhythm and normal heart sounds.  Exam reveals no gallop and no friction rub.   No murmur heard. Pulmonary/Chest: Effort normal and breath sounds normal. No respiratory distress. She exhibits tenderness.  Patient tender to palpation of the left chest wall, left pectoralis muscle.  She has pain with extension of the glenohumeral joint.  There is no ecchymosis or deformity. Mild first degree burns consistent with sun exposure on the shoulder.  Abdominal: Soft. Bowel sounds are normal. She exhibits no distension and no mass. There is no tenderness. There is no guarding.  Neurological: She is alert and oriented to person, place, and time.  Skin: Skin is warm and dry. She is not diaphoretic.  Psychiatric: She has a normal mood and affect.    ED Course   Procedures (including critical care time)  Labs Reviewed  CBC - Abnormal; Notable for the following:    WBC 11.0 (*)    RBC 5.32 (*)    Hemoglobin 15.5 (*)    All  other components within normal limits  BASIC METABOLIC PANEL  SEDIMENTATION RATE  POCT I-STAT TROPONIN I   No results found. 1. Chest wall pain   2. UTI (lower urinary tract infection)     MDM  4:21 PM Filed Vitals:   03/24/13 1528  BP: 137/57  Pulse: 60  Temp: 97.1 F (36.2 C)  Resp: 20   Patient here with complaint of left chest pain.  Risk factors for acute coronary syndrome include age and borderline hypertension.  She has no history of cardiac disease.  Patient's symptoms appear to be consistent with musculoskeletal complaint.  I've ordered a sedimentation rate as well due to her history of polymyalgia rheumatica.  Patient will get chest x-ray and specifically left review in order to assess for occult fracture which he is at risk for further long-term prednisone use.   5:48 PM Leukocytosis and positive UA for urinary tract infection. Her EKG is abnormal with inverted P waves however she does not have any sign of ischemia on EKG.  Some mild hypokalemia.  Her x-ray is negative.  6:26 PM Filed Vitals:   03/24/13 1528  BP: 137/57  Pulse: 60  Temp: 97.1 F (36.2 C)  TempSrc: Oral  Resp: 20  SpO2: 96%   Patient seen in Shared visit with Dr. Rhunette Croft. She has reproducible chest wall pain.  Breast exam nation reveals no abnormalities, no nipple discharge, no lumps or masses. Patient will be discharged home with pain medication and antibiotics.The patient appears reasonably screened and/or stabilized for discharge  and I doubt any other medical condition or other [facility] requiring further screening, evaluation, or treatment in the ED at this time prior to discharge.    Arthor Captain, PA-C 03/24/13 1829

## 2013-03-24 NOTE — ED Notes (Signed)
Pt c/o lt side chest pain through to both shoulders on movement; pt denies sob/diaphoresis/nausea; pt states was very active this weekend, went swimming twice; pt c/o pain when raising arms to take off shirt. Pt in no acute distress.

## 2013-03-24 NOTE — ED Provider Notes (Signed)
Medical screening examination/treatment/procedure(s) were conducted as a shared visit with non-physician practitioner(s) and myself.  I personally evaluated the patient during the encounter  Derwood Kaplan, MD 03/24/13 [phone]

## 2013-03-26 LAB — URINE CULTURE

## 2013-03-27 NOTE — ED Notes (Signed)
+   Urine Patient treated with Sulfa-trim sensitive to same-chart appended per protocol MD. 

## 2013-06-14 ENCOUNTER — Other Ambulatory Visit: Payer: Self-pay | Admitting: Internal Medicine

## 2013-07-11 ENCOUNTER — Telehealth: Payer: Self-pay | Admitting: Internal Medicine

## 2013-07-11 NOTE — Telephone Encounter (Signed)
Please get clinical information  To decide on OV or hand referral.

## 2013-07-11 NOTE — Telephone Encounter (Signed)
[facility] Tricia Mendez is complaining of hand pain, like corpel tunnel. Pt would like referral to hand specialist. pls advise

## 2013-07-14 NOTE — Telephone Encounter (Signed)
Left message at below listed number for the pt to return my call. 

## 2013-07-16 NOTE — Telephone Encounter (Signed)
Spoke to the pt.  Asked her about her hand(s).  She explained the pain first started in her left thumb and then spread throughout her hand.  Usually has some pain but lately has been very bad.  Pain also in her right thumb and hand.  Comes and goes.  Explained it may be her arthritis.  Advised she call Dr. Tawana Scale' office for an appt.  Instructed her to call back if unable to get an appt.  Pt agreed and will call Dr. Shawnee Knapp office.

## 2013-07-28 ENCOUNTER — Encounter: Payer: Self-pay | Admitting: Internal Medicine

## 2013-07-28 ENCOUNTER — Other Ambulatory Visit: Payer: Self-pay | Admitting: Internal Medicine

## 2013-07-28 ENCOUNTER — Ambulatory Visit (INDEPENDENT_AMBULATORY_CARE_PROVIDER_SITE_OTHER): Payer: Medicare Other | Admitting: Internal Medicine

## 2013-07-28 VITALS — BP 130/72 | HR 56 | Temp 98.1°F | Wt 172.0 lb

## 2013-07-28 DIAGNOSIS — R21 Rash and other nonspecific skin eruption: Secondary | ICD-10-CM

## 2013-07-28 DIAGNOSIS — B029 Zoster without complications: Secondary | ICD-10-CM

## 2013-07-28 MED ORDER — VALACYCLOVIR HCL 1 G PO TABS: 1000.0000 mg | ORAL_TABLET | Freq: Three times a day (TID) | ORAL | Status: DC

## 2013-07-28 NOTE — Progress Notes (Signed)
Chief Complaint  Patient presents with  . Rash    On left side of abdomen    HPI: Patient comes in today for SDA for  new problem evaluation. Here a;lpone but disc with susan daughter over the phoine also  Rash since last week  Hx of shingles   Not recently Has rash left trunk some itch not a lot of pain remembering but has memory issues ? If put anything on it.  ROS: See pertinent positives and negatives per HPI. No fever systemic sx   Past Medical History  Diagnosis Date  . Cellulitis of left leg   . Eczema   . Hypertension   . Mild cognitive impairment   . Osteoarthritis   . Polymyalgia rheumatica     rx with prednisone  . Varicose veins   . Xerosis of skin   . Urinary incontinence   . History of renal stone     excision    Family History  Problem Relation Age of Onset  . Melanoma Mother     23  . Stroke Father     14  . Osteoporosis Other     History   Social History  . Marital Status: Widowed    Spouse Name: N/A    Number of Children: N/A  . Years of Education: N/A   Occupational History  . retired Runner, broadcasting/film/video    Social History Main Topics  . Smoking status: Former Smoker    Start date: 08/07/1944    Quit date: 10/13/1945  . Smokeless tobacco: Former Neurosurgeon    Quit date: 10/01/1945  . Alcohol Use: Yes     Comment: social  . Drug Use: No  . Sexual Activity: None   Other Topics Concern  . None   Social History Narrative   Widowed remote hx of tobacco x 4 years   Living at friends home.   G4P3   Daughters have HCPOA  If needed             Outpatient Encounter Prescriptions as of 07/28/2013  Medication Sig  . Ascorbic Acid (VITAMIN C) 100 MG tablet Take 100 mg by mouth every morning.   Marland Kitchen atenolol-chlorthalidone (TENORETIC) 50-25 MG per tablet TAKE 1 TABLET BY MOUTH DAILY.  . calcium carbonate (OS-CAL - DOSED IN MG OF ELEMENTAL CALCIUM) 1250 MG tablet Take 1 tablet by mouth every morning.  . cholecalciferol (VITAMIN D) 1000 UNITS tablet Take  1,000 Units by mouth every morning.   . donepezil (ARICEPT) 10 MG tablet Take 10 mg by mouth every morning.  Marland Kitchen HYDROcodone-acetaminophen (NORCO) 5-325 MG per tablet Take 1 tablet by mouth every 6 (six) hours as needed for pain.  . Multiple Vitamin (MULTIVITAMIN WITH MINERALS) TABS Take 1 tablet by mouth every morning.  . predniSONE (DELTASONE) 1 MG tablet Take 2 mg by mouth every morning.   . sertraline (ZOLOFT) 50 MG tablet TAKE ONE TABLET BY MOUTH EVERY DAY  . solifenacin (VESICARE) 10 MG tablet Take 10 mg by mouth every morning.   . valACYclovir (VALTREX) 1000 MG tablet Take 1 tablet (1,000 mg total) by mouth 3 (three) times daily.  . [DISCONTINUED] sulfamethoxazole-trimethoprim (SEPTRA DS) 800-160 MG per tablet Take 1 tablet by mouth 2 (two) times daily.    EXAM:  BP 130/72  Pulse 56  Temp(Src) 98.1 F (36.7 C) (Oral)  Wt 172 lb (78.019 kg)  SpO2 97%  Body mass index is 28.62 kg/(m^2).  GENERAL: vitals reviewed and listed above, alert, oriented, appears  well hydrated and in no acute distress HEENT: atraumatic, conjunctiva  clear, no obvious abnormalities on inspection of external nose and ears  NECK: no obvious masses on inspection palpation  Left trunk with papular red pink rash  In dermatomal distribution  No vesicle seen however   No streaking non past mid line  MS: moves all extremities without noticeable focal  Abnormality  left finger in splint . PSYCH: pleasant and cooperative,   ASSESSMENT AND PLAN:  Discussed the following assessment and plan:  Skin rash  Shingles ? - hx of same in the past.   -Patient advised to return or notify health care team  if symptoms worsen or persist or new concerns arise.  Patient Instructions  This could be shingles  So take the antiviral medication  As directed  The rash should fade with time over weeks  Contact us if increasing pain or persistent .    [name]. Shron Ozer M.D.

## 2013-07-28 NOTE — Patient Instructions (Addendum)
This could be shingles  So take the antiviral medication  As directed  The rash should fade with time over weeks  Contact us if increasing pain or persistent .

## 2013-08-04 ENCOUNTER — Other Ambulatory Visit: Payer: Self-pay | Admitting: Internal Medicine

## 2013-09-19 ENCOUNTER — Telehealth: Payer: Self-pay | Admitting: Internal Medicine

## 2013-09-19 NOTE — Telephone Encounter (Signed)
Spoke to the pt's daughter.  Denied any fever or other sx other than the pt feels like there is phlegm in her chest that she cannot get out.  Does have some cough on and off through out the day.  Lasts for a few minutes at a time and then stops.  Pt's daughter will watch for worsening sx.  She will also try Mucinex to see if that will help the cough and see if she can produce any phlegm.

## 2013-09-19 NOTE — Telephone Encounter (Signed)
Patient Information:  Caller Name: Darl PikesSusan  Phone: [phone]  Patient: Brooks SailorsHojes, Betzabe G  Gender: Female  DOB: 04-13-1924  Age: 80 Years  PCP: Berniece AndreasPanosh, Wanda [facility](Family Practice)  Office Follow Up:  Does the office need to follow up with this patient?: Yes  Instructions For The Office: Daughter requests a call back to let her know.   Symptoms  Reason For Call & Symptoms: Daughter calling stating pt has had a cough since 09/17/13. They are out of town. Pt is afebrile. Not coughing up any sputum; "will not break up". Does not think it is environmental allergens b/c it started slighty before she left. No flu sx. I/O wnl for pt. Afebrile. Asking for an antibiotic to be called in since they are out of town. Triager did discuss that antibiotic likely not warrnated at this time but would send a note for MD's opinion as daughter states it's been called in before. Pharmacy, if needed, [phone], [place]. Daughter requests a call back to let her know.  Reviewed Health History In EMR: Yes  Reviewed Medications In EMR: Yes  Reviewed Allergies In EMR: Yes  Reviewed Surgeries / Procedures: Yes  Date of Onset of Symptoms: 09/17/2013  Guideline(s) Used:  Cough  Disposition Per Guideline:   Home Care  Reason For Disposition Reached:   Cough with no complications  Advice Given:  N/A  Patient Will Follow Care Advice:  YES

## 2013-09-26 ENCOUNTER — Ambulatory Visit (INDEPENDENT_AMBULATORY_CARE_PROVIDER_SITE_OTHER): Payer: Medicare Other | Admitting: Family Medicine

## 2013-09-26 ENCOUNTER — Encounter: Payer: Self-pay | Admitting: Family Medicine

## 2013-09-26 VITALS — BP 134/72 | HR 52 | Temp 97.9°F | Wt 170.0 lb

## 2013-09-26 DIAGNOSIS — B029 Zoster without complications: Secondary | ICD-10-CM

## 2013-09-26 MED ORDER — TRIAMCINOLONE ACETONIDE 0.1 % EX CREA: 1.0000 "application " | TOPICAL_CREAM | Freq: Two times a day (BID) | CUTANEOUS | Status: DC

## 2013-09-26 NOTE — Progress Notes (Signed)
   Subjective:    Patient ID: Tricia ScalesBetty G Salyers, female    DOB: 09/25/1923, 80 y.o.   MRN: [id number]  HPI Patient seen with right thoracic area rash for the past 10 days or so. Minimally painful and moderately pruritic. Started off with erythematous rash and some vesicular features. Her pain is only 3 of 10 intensity. She states she has had shingles 2 times previously and also had vaccine couple years ago. Denies any fever or chills. No alleviating factors.  Past Medical History  Diagnosis Date  . Cellulitis of left leg   . Eczema   . Hypertension   . Mild cognitive impairment   . Osteoarthritis   . Polymyalgia rheumatica     rx with prednisone  . Varicose veins   . Xerosis of skin   . Urinary incontinence   . History of renal stone     excision   Past Surgical History  Procedure Laterality Date  . Joint replacement    . Kidney stone surgey    . Cataract surgery    . Lipoma removal      reports that she quit smoking about 68 years ago. She started smoking about 69 years ago. She quit smokeless tobacco use about 68 years ago. She reports that she drinks alcohol. She reports that she does not use illicit drugs. family history includes Melanoma in her mother; Osteoporosis in her other; Stroke in her father. Allergies  Allergen Reactions  . Myrbetriq Theodosia Paling[[employer]] Other (See Comments)    Dizzy foggy cns      Review of Systems  Constitutional: Negative for fever and chills.  Skin: Positive for rash.       Objective:   Physical Exam  Constitutional: She appears well-developed and well-nourished.  Cardiovascular: Normal rate.   Pulmonary/Chest: Effort normal and breath sounds normal. No respiratory distress. She has no wheezes. She has no rales.  Skin: Rash noted.  Right thoracic area anterior, just beneath the right breast reveals area of rash which is approximately 10 cm in length and about 6 cm in width. Erythematous slightly raised and vesicular. No pustules.  Minimally tender          Assessment & Plan:  Shingles. This would represent a third occurrence. She is not immunocompromised. She is having minimal pain- mostly pruritus. Triamcinolone 0.1% cream topically as needed. At this point she's not needing any analgesics.

## 2013-09-26 NOTE — Patient Instructions (Signed)
Shingles Shingles (herpes zoster) is an infection that is caused by the same virus that causes chickenpox (varicella). The infection causes a painful skin rash and fluid-filled blisters, which eventually break open, crust over, and heal. It may occur in any area of the body, but it usually affects only one side of the body or face. The pain of shingles usually lasts about 1 month. However, some people with shingles may develop long-term (chronic) pain in the affected area of the body. Shingles often occurs many years after the person had chickenpox. It is more common:  In people older than 50 years.  In people with weakened immune systems, such as those with HIV, AIDS, or cancer.  In people taking medicines that weaken the immune system, such as transplant medicines.  In people under great stress. CAUSES  Shingles is caused by the varicella zoster virus (VZV), which also causes chickenpox. After a person is infected with the virus, it can remain in the person's body for years in an inactive state (dormant). To cause shingles, the virus reactivates and breaks out as an infection in a nerve root. The virus can be spread from person to person (contagious) through contact with open blisters of the shingles rash. It will only spread to people who have not had chickenpox. When these people are exposed to the virus, they may develop chickenpox. They will not develop shingles. Once the blisters scab over, the person is no longer contagious and cannot spread the virus to others. SYMPTOMS  Shingles shows up in stages. The initial symptoms may be pain, itching, and tingling in an area of the skin. This pain is usually described as burning, stabbing, or throbbing.In a few days or weeks, a painful red rash will appear in the area where the pain, itching, and tingling were felt. The rash is usually on one side of the body in a band or belt-like pattern. Then, the rash usually turns into fluid-filled blisters. They  will scab over and dry up in approximately 2 3 weeks. Flu-like symptoms may also occur with the initial symptoms, the rash, or the blisters. These may include:  Fever.  Chills.  Headache.  Upset stomach. DIAGNOSIS  Your caregiver will perform a skin exam to diagnose shingles. Skin scrapings or fluid samples may also be taken from the blisters. This sample will be examined under a microscope or sent to a lab for further testing. TREATMENT  There is no specific cure for shingles. Your caregiver will likely prescribe medicines to help you manage the pain, recover faster, and avoid long-term problems. This may include antiviral drugs, anti-inflammatory drugs, and pain medicines. HOME CARE INSTRUCTIONS   Take a cool bath or apply cool compresses to the area of the rash or blisters as directed. This may help with the pain and itching.   Only take over-the-counter or prescription medicines as directed by your caregiver.   Rest as directed by your caregiver.  Keep your rash and blisters clean with mild soap and cool water or as directed by your caregiver.  Do not pick your blisters or scratch your rash. Apply an anti-itch cream or numbing creams to the affected area as directed by your caregiver.  Keep your shingles rash covered with a loose bandage (dressing).  Avoid skin contact with:  Babies.   Pregnant women.   Children with eczema.   Elderly people with transplants.   People with chronic illnesses, such as leukemia or AIDS.   Wear loose-fitting clothing to help ease   the pain of material rubbing against the rash.  Keep all follow-up appointments with your caregiver.If the area involved is on your face, you may receive a referral for follow-up to a specialist, such as an eye doctor (ophthalmologist) or an ear, nose, and throat (ENT) doctor. Keeping all follow-up appointments will help you avoid eye complications, chronic pain, or disability.  SEEK IMMEDIATE MEDICAL  CARE IF:   You have facial pain, pain around the eye area, or loss of feeling on one side of your face.  You have ear pain or ringing in your ear.  You have loss of taste.  Your pain is not relieved with prescribed medicines.   Your redness or swelling spreads.   You have more pain and swelling.  Your condition is worsening or has changed.   You have a feveror persistent symptoms for more than 2 3 days.  You have a fever and your symptoms suddenly get worse. MAKE SURE YOU:  Understand these instructions.  Will watch your condition.  Will get help right away if you are not doing well or get worse. Document Released: 08/14/2005 Document Revised: 05/08/2012 Document Reviewed: 03/28/2012 Exit[employer] Patient Information 2014 [employer].  

## 2013-09-26 NOTE — Progress Notes (Signed)
Pre visit review using our clinic review tool, if applicable. No additional management support is needed unless otherwise documented below in the visit note. 

## 2013-10-10 ENCOUNTER — Telehealth: Payer: Self-pay | Admitting: Internal Medicine

## 2013-10-10 MED ORDER — SERTRALINE HCL 50 MG PO TABS: ORAL_TABLET | ORAL | Status: DC

## 2013-10-10 NOTE — Telephone Encounter (Signed)
I sent the prescription to the pharmacy.  Please have scheduler call and make Medicare Wellness in the next 4 months [facility].  Thanks!

## 2013-10-10 NOTE — Telephone Encounter (Signed)
Pt has been seen for acute problems.  Last CPE in 2013.  Has no future appointment scheduled.

## 2013-10-10 NOTE — Telephone Encounter (Signed)
Ok to refill x 6 months  Have her schedule for PVwellness  In the next 4 months

## 2013-10-10 NOTE — Telephone Encounter (Signed)
[facility] requesting refill of sertraline (ZOLOFT) 50 MG tablet

## 2013-10-10 NOTE — Telephone Encounter (Signed)
Called pt. Lm to return my call

## 2013-11-11 ENCOUNTER — Telehealth: Payer: Self-pay | Admitting: Internal Medicine

## 2013-11-11 MED ORDER — ATENOLOL-CHLORTHALIDONE 50-25 MG PO TABS: ORAL_TABLET | ORAL | Status: DC

## 2013-11-11 NOTE — Telephone Encounter (Signed)
[facility]-[place], [place] - [place], [place] - [phone] requesting re-fill on atenolol-chlorthalidone (TENORETIC) 50-25 MG per tablet

## 2013-11-11 NOTE — Telephone Encounter (Signed)
I have sent in a 30 day supply of her medication.  She needs to make her yearly wellness exam.  Not had one since 2013.   Please schedule with the pt.  She will have lab work the day she comes so make sure she is fasting. Thanks!

## 2013-11-12 NOTE — Telephone Encounter (Signed)
She is scheduled for 01/21/14 at 0945.  Appointment made with her daughter [name]. She is aware and okay with the med re-fills until the appointment.

## 2014-01-21 ENCOUNTER — Ambulatory Visit (INDEPENDENT_AMBULATORY_CARE_PROVIDER_SITE_OTHER): Payer: Medicare Other | Admitting: Internal Medicine

## 2014-01-21 ENCOUNTER — Encounter: Payer: Self-pay | Admitting: Internal Medicine

## 2014-01-21 VITALS — BP 136/82 | Temp 97.5°F | Ht 64.0 in | Wt 170.0 lb

## 2014-01-21 DIAGNOSIS — Z7189 Other specified counseling: Secondary | ICD-10-CM | POA: Insufficient documentation

## 2014-01-21 DIAGNOSIS — Z23 Encounter for immunization: Secondary | ICD-10-CM

## 2014-01-21 DIAGNOSIS — I10 Essential (primary) hypertension: Secondary | ICD-10-CM

## 2014-01-21 DIAGNOSIS — Z0000 Encounter for general adult medical examination without abnormal findings: Secondary | ICD-10-CM | POA: Insufficient documentation

## 2014-01-21 DIAGNOSIS — M1990 Unspecified osteoarthritis, unspecified site: Secondary | ICD-10-CM

## 2014-01-21 DIAGNOSIS — Z79899 Other long term (current) drug therapy: Secondary | ICD-10-CM

## 2014-01-21 DIAGNOSIS — M353 Polymyalgia rheumatica: Secondary | ICD-10-CM

## 2014-01-21 DIAGNOSIS — R4189 Other symptoms and signs involving cognitive functions and awareness: Secondary | ICD-10-CM

## 2014-01-21 DIAGNOSIS — F09 Unspecified mental disorder due to known physiological condition: Secondary | ICD-10-CM

## 2014-01-21 DIAGNOSIS — R32 Unspecified urinary incontinence: Secondary | ICD-10-CM

## 2014-01-21 LAB — CBC WITH DIFFERENTIAL/PLATELET
BASOS ABS: 0.1 10*3/uL (ref 0.0–0.1)
Basophils Relative: 0.8 % (ref 0.0–3.0)
Eosinophils Absolute: 0.2 10*3/uL (ref 0.0–0.7)
Eosinophils Relative: 2.1 % (ref 0.0–5.0)
HCT: 43.6 % (ref 36.0–46.0)
HEMOGLOBIN: 14.9 g/dL (ref 12.0–15.0)
LYMPHS PCT: 41.3 % (ref 12.0–46.0)
Lymphs Abs: 3.4 10*3/uL (ref 0.7–4.0)
MCHC: 34.3 g/dL (ref 30.0–36.0)
MCV: 85.5 fl (ref 78.0–100.0)
MONOS PCT: 9.4 % (ref 3.0–12.0)
Monocytes Absolute: 0.8 10*3/uL (ref 0.1–1.0)
NEUTROS ABS: 3.8 10*3/uL (ref 1.4–7.7)
NEUTROS PCT: 46.4 % (ref 43.0–77.0)
Platelets: 268 10*3/uL (ref 150.0–400.0)
RBC: 5.09 Mil/uL (ref 3.87–5.11)
RDW: 14 % (ref 11.5–15.5)
WBC: 8.1 10*3/uL (ref 4.0–10.5)

## 2014-01-21 LAB — LIPID PANEL
CHOL/HDL RATIO: 5
Cholesterol: 252 mg/dL — ABNORMAL HIGH (ref 0–200)
HDL: 51.7 mg/dL (ref >39.00)
LDL CALC: 179 mg/dL — AB (ref 0–99)
NONHDL: 200.3
Triglycerides: 109 mg/dL (ref 0.0–149.0)
VLDL: 21.8 mg/dL (ref 0.0–40.0)

## 2014-01-21 LAB — HEPATIC FUNCTION PANEL
ALBUMIN: 4 g/dL (ref 3.5–5.2)
ALK PHOS: 57 U/L (ref 39–117)
ALT: 21 U/L (ref 0–35)
AST: 27 U/L (ref 0–37)
BILIRUBIN DIRECT: 0.1 mg/dL (ref 0.0–0.3)
Total Bilirubin: 0.8 mg/dL (ref 0.2–1.2)
Total Protein: 6.7 g/dL (ref 6.0–8.3)

## 2014-01-21 LAB — BASIC METABOLIC PANEL
BUN: 28 mg/dL — ABNORMAL HIGH (ref 6–23)
CALCIUM: 9.7 mg/dL (ref 8.4–10.5)
CO2: 30 mEq/L (ref 19–32)
Chloride: 101 mEq/L (ref 96–112)
Creatinine, Ser: 0.7 mg/dL (ref 0.4–1.2)
GFR: 86.84 mL/min (ref >60.00)
GLUCOSE: 84 mg/dL (ref 70–99)
Potassium: 3.3 mEq/L — ABNORMAL LOW (ref 3.5–5.1)
SODIUM: 140 meq/L (ref 135–145)

## 2014-01-21 NOTE — Patient Instructions (Signed)
Continue lifestyle intervention healthy eating and exercise . BP IS GOOD . Will notify you  of labs when available. You had prevnar 13 today . ROV in 6 months  For med check . Or as needed .

## 2014-01-21 NOTE — Progress Notes (Signed)
Chief Complaint  Patient presents with  . Medicare Wellness    here with daughter cognitive problems  . Hypertension    HPI: Patient comes in today for Preventive Medicare wellness visit . No major injuries, ed visits ,hospitalizations , new medications since last visit. Memory. About the same of worse frustrated but functioning and can do most things dec STM can drive locally and familiar places at friends home takes aricept. Mood stable no depression anxiety at this time OA/PMR: sees DR B  On low dose prednisone  Left hand thumb arthritis wears splint at times  Recurrent utis hx none recnetlybut has frequency and nocturia  No falling neuro sx except memory  Skin sees dr Margo AyeHall dry skin on legs  Moisturizers  HT controlled   Health Maintenance  Topic Date Due  . Colonoscopy  04/18/1976  . Pneumococcal Polysaccharide Vaccine Age 78 And Over  04/19/1991  . Influenza Vaccine  03/28/2014  . Tetanus/tdap  04/17/2021  . Zostavax  Completed   Health Maintenance Review See scanned document  Hearing:  Ok   Vision:  No limitations at present . Last eye check UTD  Safety:  Has smoke detector and wears seat belts.  No firearms. No excess sun exposure. Sees dentist regularly.  Falls: no  Advance directive :  Reviewed  . Has hcpoa and living will   Memory: ,  About the same decrease stm her or her family.  Depression: No anhedonia unusual crying or depressive symptoms frustration  Nutrition: Eats well balanced diet; adequate calcium and vitamin D. No swallowing chewing problems.  Injury: no major injuries in the last six months.  Other healthcare providers:  Reviewed today .  Social:  Lives frinds home many friends are passing away is very social  No pets.   Preventive parameters: up-to-date  Reviewed prevnar today  ADLS:   There are no problems or need for assistance  driving, feeding, obtaining food, dressing, toileting and bathing, managing money( with assistance )  using  phone. She is independent.  EXERCISE/ HABITS  Per week walks q d   No tobacco    noetoh still drives  Not by self to areas  Known.   ROS:  GEN/ HEENT: No fever, significant weight changes sweats headaches vision problems hearing changes, CV/ PULM; No chest pain shortness of breath cough, syncope,edema  change in exercise tolerance.had eval at ed for chest soreness felt from swimming a lot the day before  Neg cv eval.  GI /GU: No adominal pain, vomiting, change in bowel habits. Had eval for blood in the stool felt from hard stool . No new  GU symptoms. SKIN/HEME: ,no acute skin rashes suspicious lesions or bleeding. No lymphadenopathy, nodules, masses.  NEURO/ PSYCH:  Noweakness numbness. No depression anxiety. IMM/ Allergy: No unusual infections.  Allergy .   REST of 12 system review negative except as per HPI   Past Medical History  Diagnosis Date  . Cellulitis of left leg   . Eczema   . Hypertension   . Mild cognitive impairment   . Osteoarthritis   . Polymyalgia rheumatica     rx with prednisone  . Varicose veins   . Xerosis of skin   . Urinary incontinence   . History of renal stone     excision    Family History  Problem Relation Age of Onset  . Melanoma Mother     [id number]  . Stroke Father     [id number]  . Osteoporosis Other  History   Social History  . Marital Status: Widowed    Spouse Name: N/A    Number of Children: N/A  . Years of Education: N/A   Occupational History  . retired Runner, broadcasting/film/video    Social History Main Topics  . Smoking status: Former Smoker    Start date: 08/07/1944    Quit date: 10/13/1945  . Smokeless tobacco: Former Neurosurgeon    Quit date: 10/01/1945  . Alcohol Use: Yes     Comment: social  . Drug Use: No  . Sexual Activity: None   Other Topics Concern  . None   Social History Narrative   Widowed remote hx of tobacco x 4 years   Living at friends home.   G4P3   Daughters have HCPOA  If needed             Outpatient Encounter  Prescriptions as of 01/21/2014  Medication Sig  . ALREX 0.2 % SUSP   . Ascorbic Acid (VITAMIN C) 100 MG tablet Take 100 mg by mouth every morning.   Marland Kitchen atenolol-chlorthalidone (TENORETIC) 50-25 MG per tablet TAKE ONE TABLET BY MOUTH EVERY DAY  . calcium carbonate (OS-CAL - DOSED IN MG OF ELEMENTAL CALCIUM) 1250 MG tablet Take 1 tablet by mouth every morning.  . cholecalciferol (VITAMIN D) 1000 UNITS tablet Take 1,000 Units by mouth every morning.   . donepezil (ARICEPT) 10 MG tablet TAKE 1/2 TABLET BY MOUTH DAILY THEN INCREASE TO 1 TABLET DAILY AFTER A FEW WEEKS.  . Multiple Vitamin (MULTIVITAMIN WITH MINERALS) TABS Take 1 tablet by mouth every morning.  . predniSONE (DELTASONE) 1 MG tablet Take 2 mg by mouth every morning.   . sertraline (ZOLOFT) 50 MG tablet TAKE ONE TABLET BY MOUTH EVERY DAY  . solifenacin (VESICARE) 10 MG tablet Take 10 mg by mouth every morning.   . triamcinolone cream (KENALOG) 0.1 % Apply 1 application topically 2 (two) times daily.  . valACYclovir (VALTREX) 1000 MG tablet Take 1 tablet (1,000 mg total) by mouth 3 (three) times daily.  . [DISCONTINUED] HYDROcodone-acetaminophen (NORCO) 5-325 MG per tablet Take 1 tablet by mouth every 6 (six) hours as needed for pain.    EXAM:  BP 136/82  Temp(Src) 97.5 F (36.4 C) (Oral)  Ht 5\' 4"  (1.626 m)  Wt 170 lb (77.111 kg)  BMI 29.17 kg/m2  Body mass index is 29.17 kg/(m^2).  Physical Exam: Vital signs reviewed ZOX:WRUE is a well-developed well-nourished alert cooperative   who appears stated age in no acute distress.  HEENT: normocephalic atraumatic , Eyes: PERRL EOM's full, conjunctiva clear, Nares: paten,t no deformity discharge or tenderness., Ears: no deformity EAC's clear TMs with normal landmarks. Mouth: clear OP, no lesions, edema.  Moist mucous membranes. Dentition in adequate repair. NECK: supple without masses, thyromegaly or bruits. CHEST/PULM:  Clear to auscultation and percussion breath sounds equal no  wheeze , rales or rhonchi. No chest wall deformities or tenderness. CV: PMI is nondisplaced, S1 S2 no gallops, murmurs, rubs. Peripheral pulses are full without delay.No JVD .  Breast: normal by inspection . No dimpling, discharge, masses, tenderness or discharge .HAS inverted nipples   Not new ABDOMEN: Bowel sounds normal nontender  No guard or rebound, no hepato splenomegal no CVA tenderness.   Extremtities:  No clubbing cyanosis or edema, no acute joint swelling or redness no focal atrophy NEURO:  Oriented x3, cranial nerves 3-12 appear to be intact, no obvious focal weakness,gait within normal limits no abnormal reflexes or asymmetrical SKIN: No  acute rashessyun changes and pigment and some exoriation left shin area ,no bruising or petechiae. PSYCH: Oriented, good eye contact, no obvious depression anxiety, interaction appear normal. LN: no cervical axillary inguinal adenopathy No noted deficits in  attention, and speech. Memory dec stm when discussing prevnar       ASSESSMENT AND PLAN:  Discussed the following assessment and plan:  Visit for preventive health examination  Medicare annual wellness visit, subsequent - Plan: Basic metabolic panel, CBC with Differential, Hepatic function panel, Lipid panel, TSH  Cognitive deficits - Plan: Basic metabolic panel, CBC with Differential, Hepatic function panel, Lipid panel, TSH  HYPERTENSION - Plan: Basic metabolic panel, CBC with Differential, Hepatic function panel, Lipid panel, TSH  Need for vaccination with 13-polyvalent pneumococcal conjugate vaccine - Plan: Pneumococcal conjugate vaccine 13-valent, Basic metabolic panel, CBC with Differential, Hepatic function panel, Lipid panel, TSH  Osteoarthrosis, unspecified whether generalized or localized, unspecified site  Urinary incontinence  Polymyalgia rheumatica  Medication management - stay on zoloft  Overall she's doing pretty well has its and to change her blood pressure medicine  however it could help her I suppose with the urinary frequency. Cognitive state seems about the same but no formal testing done today except for screening. Her mood is certainly stable has good family support and is quite functional.  Her bladder medication does have some anticholinergic effect but I doubt that that is the thing significant CNS symptoms. Patient Care Team: Madelin Headings, MD as PCP - General Donnetta Hail, MD (Rheumatology) Dorothyann Gibbs, MD (Ophthalmology) Mertha Finders., MD (Dermatology)  Patient Instructions  Continue lifestyle intervention healthy eating and exercise . BP IS GOOD . Will notify you  of labs when available. You had prevnar 13 today . ROV in 6 months  For med check . Or as needed .    [name]. Artemus Romanoff M.D.  Pre visit review using our clinic review tool, if applicable. No additional management support is needed unless otherwise documented below in the visit note.

## 2014-01-22 ENCOUNTER — Telehealth: Payer: Self-pay | Admitting: Internal Medicine

## 2014-01-22 LAB — TSH: TSH: 3.67 u[IU]/mL (ref 0.35–4.50)

## 2014-01-22 NOTE — Telephone Encounter (Signed)
Relevant patient education assigned to patient using Emmi. ° °

## 2014-01-29 ENCOUNTER — Other Ambulatory Visit: Payer: Self-pay | Admitting: Internal Medicine

## 2014-01-30 NOTE — Telephone Encounter (Signed)
Sent to the pharmacy by e-scribe. 

## 2014-02-05 ENCOUNTER — Other Ambulatory Visit: Payer: Self-pay | Admitting: Internal Medicine

## 2014-02-05 NOTE — Telephone Encounter (Signed)
Sent to the pharmacy by e-scribe. 

## 2014-02-16 ENCOUNTER — Other Ambulatory Visit: Payer: Self-pay | Admitting: Family Medicine

## 2014-02-16 DIAGNOSIS — E876 Hypokalemia: Secondary | ICD-10-CM

## 2014-02-16 MED ORDER — POTASSIUM CHLORIDE ER 10 MEQ PO TBCR: 20.0000 meq | EXTENDED_RELEASE_TABLET | Freq: Every day | ORAL | Status: DC

## 2014-03-05 ENCOUNTER — Encounter: Payer: Self-pay | Admitting: Family Medicine

## 2014-03-05 ENCOUNTER — Ambulatory Visit (INDEPENDENT_AMBULATORY_CARE_PROVIDER_SITE_OTHER): Payer: Medicare Other | Admitting: Family Medicine

## 2014-03-05 VITALS — BP 112/70 | HR 58 | Temp 98.0°F | Ht 64.0 in

## 2014-03-05 DIAGNOSIS — Q179 Congenital malformation of ear, unspecified: Secondary | ICD-10-CM

## 2014-03-05 DIAGNOSIS — J3089 Other allergic rhinitis: Secondary | ICD-10-CM

## 2014-03-05 DIAGNOSIS — Q178 Other specified congenital malformations of ear: Secondary | ICD-10-CM

## 2014-03-05 NOTE — Patient Instructions (Signed)
nasocort daily for 1 month  Follow up with your doctor if worsens or persists

## 2014-03-05 NOTE — Progress Notes (Signed)
Pre visit review using our clinic review tool, if applicable. No additional management support is needed unless otherwise documented below in the visit note. 

## 2014-03-05 NOTE — Progress Notes (Signed)
No chief complaint on file.   HPI:  -started: 1 2 weeks ago -symptoms:R ear pops sometimes and full, PND -denies:fever, SOB, NVD, tooth pain -has tried: nothing  ROS: See pertinent positives and negatives per HPI.  Past Medical History  Diagnosis Date  . Cellulitis of left leg   . Eczema   . Hypertension   . Mild cognitive impairment   . Osteoarthritis   . Polymyalgia rheumatica     rx with prednisone  . Varicose veins   . Xerosis of skin   . Urinary incontinence   . History of renal stone     excision    Past Surgical History  Procedure Laterality Date  . Joint replacement    . Kidney stone surgey    . Cataract surgery    . Lipoma removal      Family History  Problem Relation Age of Onset  . Melanoma Mother     70  . Stroke Father     12  . Osteoporosis Other     History   Social History  . Marital Status: Widowed    Spouse Name: N/A    Number of Children: N/A  . Years of Education: N/A   Occupational History  . retired Runner, broadcasting/film/video    Social History Main Topics  . Smoking status: Former Smoker    Start date: 08/07/1944    Quit date: 10/13/1945  . Smokeless tobacco: Former Neurosurgeon    Quit date: 10/01/1945  . Alcohol Use: Yes     Comment: social  . Drug Use: No  . Sexual Activity: None   Other Topics Concern  . None   Social History Narrative   Widowed remote hx of tobacco x 4 years   Living at friends home.   G4P3   Daughters have HCPOA  If needed             Current outpatient prescriptions:ALREX 0.2 % SUSP, , Disp: , Rfl: ;  Ascorbic Acid (VITAMIN C) 100 MG tablet, Take 100 mg by mouth every morning. , Disp: , Rfl: ;  atenolol-chlorthalidone (TENORETIC) 50-25 MG per tablet, TAKE ONE TABLET BY MOUTH EVERY DAY, Disp: 30 tablet, Rfl: 11;  calcium carbonate (OS-CAL - DOSED IN MG OF ELEMENTAL CALCIUM) 1250 MG tablet, Take 1 tablet by mouth every morning., Disp: , Rfl:  cholecalciferol (VITAMIN D) 1000 UNITS tablet, Take 1,000 Units by mouth  every morning. , Disp: , Rfl: ;  donepezil (ARICEPT) 10 MG tablet, TAKE ONE TABLET BY MOUTH NIGHTLY, Disp: 90 tablet, Rfl: 3;  Multiple Vitamin (MULTIVITAMIN WITH MINERALS) TABS, Take 1 tablet by mouth every morning., Disp: , Rfl: ;  potassium chloride (K-DUR) 10 MEQ tablet, Take 2 tablets (20 mEq total) by mouth daily., Disp: 60 tablet, Rfl: 1 predniSONE (DELTASONE) 1 MG tablet, Take 2 mg by mouth every morning. , Disp: , Rfl: ;  sertraline (ZOLOFT) 50 MG tablet, TAKE ONE TABLET BY MOUTH EVERY DAY, Disp: 30 tablet, Rfl: 5;  solifenacin (VESICARE) 10 MG tablet, Take 10 mg by mouth every morning. , Disp: , Rfl: ;  triamcinolone cream (KENALOG) 0.1 %, Apply 1 application topically 2 (two) times daily., Disp: 30 g, Rfl: 0 valACYclovir (VALTREX) 1000 MG tablet, Take 1 tablet (1,000 mg total) by mouth 3 (three) times daily., Disp: 21 tablet, Rfl: 0  EXAM:  Filed Vitals:   03/05/14 1423  BP: 112/70  Pulse: 58  Temp: 98 F (36.7 C)    Body mass index is 0.00  kg/(m^2).  GENERAL: vitals reviewed and listed above, alert, oriented, appears well hydrated and in no acute distress  HEENT: atraumatic, conjunttiva clear, no obvious abnormalities on inspection of external nose and ears, normal appearance of ear canals and TMs except for clear effusion R ear, clear nasal congestion, mild post oropharyngeal erythema with PND, no tonsillar edema or exudate, no sinus TTP  NECK: no obvious masses on inspection  LUNGS: clear to auscultation bilaterally, no wheezes, rales or rhonchi, good air movement  CV: HRRR, no peripheral edema  MS: moves all extremities without noticeable abnormality  PSYCH: pleasant and cooperative, no obvious depression or anxiety  ASSESSMENT AND PLAN:  Discussed the following assessment and plan:  Other allergic rhinitis  Eustachian tube anomaly  -given HPI and exam findings today, a serious infection or illness is unlikely. We discussed potential etiologies, with AR with R  eustachian tube dysfunction being most likely -of course, we advised to return or notify a doctor immediately if symptoms worsen or persist or new concerns arise.    Patient Instructions  nasocort daily for 1 month  Follow up with your doctor if worsens or persists     KIM, HANNAH R.

## 2014-03-16 ENCOUNTER — Other Ambulatory Visit: Payer: Self-pay | Admitting: Internal Medicine

## 2014-03-17 ENCOUNTER — Other Ambulatory Visit (INDEPENDENT_AMBULATORY_CARE_PROVIDER_SITE_OTHER): Payer: Medicare Other

## 2014-03-17 DIAGNOSIS — E876 Hypokalemia: Secondary | ICD-10-CM

## 2014-03-17 LAB — BASIC METABOLIC PANEL
BUN: 21 mg/dL (ref 6–23)
CALCIUM: 10 mg/dL (ref 8.4–10.5)
CO2: 31 mEq/L (ref 19–32)
CREATININE: 0.6 mg/dL (ref 0.4–1.2)
Chloride: 100 mEq/L (ref 96–112)
GFR: 94.81 mL/min (ref >60.00)
Glucose, Bld: 89 mg/dL (ref 70–99)
Potassium: 3.4 mEq/L — ABNORMAL LOW (ref 3.5–5.1)
Sodium: 138 mEq/L (ref 135–145)

## 2014-03-17 NOTE — Telephone Encounter (Signed)
Sent to the pharmacy by e-scribe. 

## 2014-03-23 ENCOUNTER — Other Ambulatory Visit: Payer: Self-pay | Admitting: Internal Medicine

## 2014-03-23 NOTE — Telephone Encounter (Signed)
Sent to the pharmacy by e-scribe. 

## 2014-04-09 ENCOUNTER — Other Ambulatory Visit: Payer: Self-pay | Admitting: Internal Medicine

## 2014-04-10 ENCOUNTER — Other Ambulatory Visit: Payer: Self-pay | Admitting: Family Medicine

## 2014-04-10 ENCOUNTER — Encounter: Payer: Self-pay | Admitting: Family Medicine

## 2014-04-10 ENCOUNTER — Ambulatory Visit (INDEPENDENT_AMBULATORY_CARE_PROVIDER_SITE_OTHER): Payer: 59 | Admitting: Emergency Medicine

## 2014-04-10 VITALS — BP 120/80 | HR 55 | Temp 98.7°F | Resp 16 | Ht 64.5 in | Wt 171.4 lb

## 2014-04-10 DIAGNOSIS — B029 Zoster without complications: Secondary | ICD-10-CM

## 2014-04-10 DIAGNOSIS — E876 Hypokalemia: Secondary | ICD-10-CM

## 2014-04-10 MED ORDER — VALACYCLOVIR HCL 1 G PO TABS: 1000.0000 mg | ORAL_TABLET | Freq: Three times a day (TID) | ORAL | Status: DC

## 2014-04-10 MED ORDER — TRIAMCINOLONE ACETONIDE 0.1 % EX CREA: 1.0000 "application " | TOPICAL_CREAM | Freq: Two times a day (BID) | CUTANEOUS | Status: DC

## 2014-04-10 NOTE — Patient Instructions (Signed)

## 2014-04-10 NOTE — Progress Notes (Signed)
[facility] [address], [place] [id number] [phone]  Date:  04/10/2014   Name:  Tricia Mendez   DOB:  12-Jan-1924   MRN:  [id number]  PCP:  Lorretta HarpPANOSH,WANDA KOTVAN, MD    Chief Complaint: Rash   History of Present Illness:  Tricia Mendez is a 80 y.o. very pleasant female patient who presents with the following:  History of prior shingles.  Has an eruption on the left chest above the breast to shoulder.  Pruritic and burns.   No fever or chills, no cough or coryza.  No nausea or vomiting.   No improvement with over the counter medications or other home remedies. Denies other complaint or health concern today.   Patient Active Problem List   Diagnosis Date Noted  . Visit for preventive health examination 01/21/2014  . Medication management 01/21/2014  . Acute respiratory infection 10/08/2012  . Urinary incontinence 10/08/2012  . Urinary tract infection, site not specified 10/08/2012  . Incontinence 08/07/2012  . Rectal bleeding hx 08/07/2012  . Medicare annual wellness visit, subsequent 08/07/2012  . Lower back pain 12/11/2011  . Urinary frequency 12/11/2011  . Medication side effect 12/11/2011  . UTI (lower urinary tract infection) 10/08/2011  . Dysuria 10/08/2011  . Dizziness 07/10/2011  . Cognitive deficits 05/01/2011  . Venous stasis dermatitis 03/13/2011  . Foot pain, right 02/22/2011  . Prolonged depressive reaction 02/22/2011  . Shingles 10/13/2010  . MILD COGNITIVE IMPAIRMENT SO STATED 05/10/2010  . UNSPEC LOCAL INFECTION SKIN&SUBCUTANEOUS TISSUE 05/10/2010  . DERMATITIS 05/10/2010  . 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 SKIN LESION 10/22/2009  . VARICOSE VEINS, LOWER EXTREMITIES 02/14/2008  . Polymyalgia rheumatica 06/25/2007  . ABNORMAL FINDINGS, ELEVATED BP W/O HTN 06/25/2007  . WEAKNESS 03/27/2007  . HYPERTENSION 03/07/2007  . OSTEOARTHRITIS 03/07/2007  . URINARY INCONTINENCE 03/07/2007    Past  Medical History  Diagnosis Date  . Cellulitis of left leg   . Eczema   . Hypertension   . Mild cognitive impairment   . Osteoarthritis   . Polymyalgia rheumatica     rx with prednisone  . Varicose veins   . Xerosis of skin   . Urinary incontinence   . History of renal stone     excision    Past Surgical History  Procedure Laterality Date  . Joint replacement    . Kidney stone surgey    . Cataract surgery    . Lipoma removal      History  Substance Use Topics  . Smoking status: Former Smoker    Start date: 08/07/1944    Quit date: 10/13/1945  . Smokeless tobacco: Former NeurosurgeonUser    Quit date: 10/01/1945  . Alcohol Use: Yes     Comment: social    Family History  Problem Relation Age of Onset  . Melanoma Mother     9101  . Stroke Father     1489  . Osteoporosis Other     Allergies  Allergen Reactions  . Myrbetriq Theodosia Paling[Mirabegron Base] Other (See Comments)    Dizzy foggy cns    Medication list has been reviewed and updated.  Current Outpatient Prescriptions on File Prior to Visit  Medication Sig Dispense Refill  . ALREX 0.2 % SUSP       . Ascorbic Acid (VITAMIN C) 100 MG tablet Take 100 mg by mouth every morning.       Marland Kitchen. atenolol-chlorthalidone (TENORETIC) 50-25 MG per tablet TAKE ONE  TABLET BY MOUTH EVERY DAY  30 tablet  11  . calcium carbonate (OS-CAL - DOSED IN MG OF ELEMENTAL CALCIUM) 1250 MG tablet Take 1 tablet by mouth every morning.      . cholecalciferol (VITAMIN D) 1000 UNITS tablet Take 1,000 Units by mouth every morning.       . donepezil (ARICEPT) 10 MG tablet TAKE ONE TABLET BY MOUTH NIGHTLY  90 tablet  3  . Multiple Vitamin (MULTIVITAMIN WITH MINERALS) TABS Take 1 tablet by mouth every morning.      . potassium chloride (K-DUR) 10 MEQ tablet TAKE TWO TABLETS BY MOUTH EVERY DAY  60 tablet  4  . predniSONE (DELTASONE) 1 MG tablet Take 2 mg by mouth every morning.       . sertraline (ZOLOFT) 50 MG tablet TAKE ONE TABLET BY MOUTH EVERY DAY  30 tablet  5  .  solifenacin (VESICARE) 10 MG tablet Take 10 mg by mouth every morning.       . triamcinolone cream (KENALOG) 0.1 % Apply 1 application topically 2 (two) times daily.  30 g  0  . valACYclovir (VALTREX) 1000 MG tablet Take 1 tablet (1,000 mg total) by mouth 3 (three) times daily.  21 tablet  0   No current facility-administered medications on file prior to visit.    Review of Systems:  As per HPI, otherwise negative.    Physical Examination: Filed Vitals:   04/10/14 1301  BP: 120/80  Pulse: 55  Temp: 98.7 F (37.1 C)  Resp: 16   Filed Vitals:   04/10/14 1301  Height: 5' 4.5" (1.638 m)  Weight: 171 lb 6.4 oz (77.747 kg)   Body mass index is 28.98 kg/(m^2). Ideal Body Weight: Weight in (lb) to have BMI = 25: 147.6   GEN: WDWN, NAD, Non-toxic, Alert & Oriented x 3 HEENT: Atraumatic, Normocephalic.  Ears and Nose: No external deformity. EXTR: No clubbing/cyanosis/edema NEURO: Normal gait.  PSYCH: Normally interactive. Conversant. Not depressed or anxious appearing.  Calm demeanor.  SKIN:  Shingles   Assessment and Plan: Shingles Valtrex  Signed,  Phillips Odor, MD

## 2014-04-19 ENCOUNTER — Emergency Department (HOSPITAL_BASED_OUTPATIENT_CLINIC_OR_DEPARTMENT_OTHER)
Admission: EM | Admit: 2014-04-19 | Discharge: 2014-04-19 | Disposition: A | Discharge status: Home / Self Care | Payer: Medicare Other | Attending: Emergency Medicine | Admitting: Emergency Medicine

## 2014-04-19 ENCOUNTER — Encounter (HOSPITAL_BASED_OUTPATIENT_CLINIC_OR_DEPARTMENT_OTHER): Payer: Self-pay | Admitting: Emergency Medicine

## 2014-04-19 DIAGNOSIS — I10 Essential (primary) hypertension: Secondary | ICD-10-CM | POA: Insufficient documentation

## 2014-04-19 DIAGNOSIS — Z87891 Personal history of nicotine dependence: Secondary | ICD-10-CM | POA: Diagnosis not present

## 2014-04-19 DIAGNOSIS — L02519 Cutaneous abscess of unspecified hand: Secondary | ICD-10-CM | POA: Insufficient documentation

## 2014-04-19 DIAGNOSIS — I891 Lymphangitis: Secondary | ICD-10-CM | POA: Diagnosis not present

## 2014-04-19 DIAGNOSIS — Z8739 Personal history of other diseases of the musculoskeletal system and connective tissue: Secondary | ICD-10-CM | POA: Diagnosis not present

## 2014-04-19 DIAGNOSIS — L03113 Cellulitis of right upper limb: Secondary | ICD-10-CM

## 2014-04-19 DIAGNOSIS — Z87442 Personal history of urinary calculi: Secondary | ICD-10-CM | POA: Insufficient documentation

## 2014-04-19 DIAGNOSIS — L03119 Cellulitis of unspecified part of limb: Principal | ICD-10-CM

## 2014-04-19 DIAGNOSIS — Z79899 Other long term (current) drug therapy: Secondary | ICD-10-CM | POA: Insufficient documentation

## 2014-04-19 DIAGNOSIS — IMO0002 Reserved for concepts with insufficient information to code with codable children: Secondary | ICD-10-CM | POA: Diagnosis not present

## 2014-04-19 MED ORDER — AMOXICILLIN-POT CLAVULANATE 875-125 MG PO TABS: 1.0000 | ORAL_TABLET | Freq: Two times a day (BID) | ORAL | Status: DC

## 2014-04-19 MED ORDER — CEFTRIAXONE SODIUM 1 G IJ SOLR
1.0000 g | INTRAMUSCULAR | Status: DC
  Administered 2014-04-19: 1 g via INTRAMUSCULAR
  Filled 2014-04-19: qty 10

## 2014-04-19 NOTE — ED Provider Notes (Signed)
CSN: [id number]     Arrival date & time 04/19/14  2013 History  This chart was scribed for Rolland Porter, MD by Evon Slack, ED Scribe. This patient was seen in room MH12/MH12 and the patient's care was started at 8:58 PM.      Chief Complaint  Patient presents with  . Hand Problem   The history is provided by the patient. No language interpreter was used.   HPI Comments: Tricia Mendez is a 80 y.o. female who presents to the Emergency Department complaining of right hand pain onset 1 day prior. She states that the hand is painful and noticed that there was some red streaking going up her arm. She denies any recent injury to the hand. No fever shakes chills rigors.  Past Medical History  Diagnosis Date  . Cellulitis of left leg   . Eczema   . Hypertension   . Mild cognitive impairment   . Osteoarthritis   . Polymyalgia rheumatica     rx with prednisone  . Varicose veins   . Xerosis of skin   . Urinary incontinence   . History of renal stone     excision   Past Surgical History  Procedure Laterality Date  . Joint replacement    . Kidney stone surgey    . Cataract surgery    . Lipoma removal     Family History  Problem Relation Age of Onset  . Melanoma Mother     13  . Stroke Father     28  . Osteoporosis Other    History  Substance Use Topics  . Smoking status: Former Smoker    Start date: 08/07/1944    Quit date: 10/13/1945  . Smokeless tobacco: Former Neurosurgeon    Quit date: 10/01/1945  . Alcohol Use: Yes     Comment: social   OB History   Grav Para Term Preterm Abortions TAB SAB Ect Mult Living                 Review of Systems  Constitutional: Negative for fever, chills, diaphoresis, appetite change and fatigue.  HENT: Negative for mouth sores, sore throat and trouble swallowing.   Eyes: Negative for visual disturbance.  Respiratory: Negative for cough, chest tightness, shortness of breath and wheezing.   Cardiovascular: Negative for chest pain.   Gastrointestinal: Negative for nausea, vomiting, abdominal pain, diarrhea and abdominal distention.  Endocrine: Negative for polydipsia, polyphagia and polyuria.  Genitourinary: Negative for dysuria, frequency and hematuria.  Musculoskeletal: Positive for arthralgias. Negative for gait problem.  Skin: Positive for color change. Negative for pallor and rash.  Neurological: Negative for dizziness, syncope, light-headedness and headaches.  Hematological: Does not bruise/bleed easily.  Psychiatric/Behavioral: Negative for behavioral problems and confusion.      Allergies  Myrbetriq  Home Medications   Prior to Admission medications   Medication Sig Start Date End Date Taking? Authorizing Provider  ALREX 0.2 % SUSP  01/20/14   Historical Provider, MD  amoxicillin-clavulanate (AUGMENTIN) 875-125 MG per tablet Take 1 tablet by mouth 2 (two) times daily. 04/19/14   Rolland Porter, MD  Ascorbic Acid (VITAMIN C) 100 MG tablet Take 100 mg by mouth every morning.     Historical Provider, MD  atenolol-chlorthalidone (TENORETIC) 50-25 MG per tablet TAKE ONE TABLET BY MOUTH EVERY DAY    Madelin Headings, MD  calcium carbonate (OS-CAL - DOSED IN MG OF ELEMENTAL CALCIUM) 1250 MG tablet Take 1 tablet by mouth every morning.  Historical Provider, MD  cholecalciferol (VITAMIN D) 1000 UNITS tablet Take 1,000 Units by mouth every morning.     Historical Provider, MD  donepezil (ARICEPT) 10 MG tablet TAKE ONE TABLET BY MOUTH NIGHTLY 02/05/14   Madelin Headings, MD  Multiple Vitamin (MULTIVITAMIN WITH MINERALS) TABS Take 1 tablet by mouth every morning.    Historical Provider, MD  potassium chloride (K-DUR) 10 MEQ tablet TAKE TWO TABLETS BY MOUTH EVERY DAY    Madelin Headings, MD  predniSONE (DELTASONE) 1 MG tablet Take 2 mg by mouth every morning.     Donnetta Hail, MD  sertraline (ZOLOFT) 50 MG tablet TAKE ONE TABLET BY MOUTH EVERY DAY 03/23/14   Madelin Headings, MD  solifenacin (VESICARE) 10 MG tablet Take 10 mg by  mouth every morning.     Historical Provider, MD  triamcinolone cream (KENALOG) 0.1 % Apply 1 application topically 2 (two) times daily. 04/10/14   Carmelina Dane, MD  valACYclovir (VALTREX) 1000 MG tablet Take 1 tablet (1,000 mg total) by mouth 3 (three) times daily. 04/10/14   Carmelina Dane, MD   Triage Vitals: BP 155/73  Pulse 70  Temp(Src) 99 F (37.2 C) (Oral)  Resp 16  Ht  (1.651 m)  Wt 170 lb (77.111 kg)  BMI 28.29 kg/m2  SpO2 97%  Physical Exam  Nursing note and vitals reviewed. Constitutional: She is oriented to person, place, and time. She appears well-developed and well-nourished. No distress.  HENT:  Head: Normocephalic.  Eyes: Conjunctivae are normal. Pupils are equal, round, and reactive to light. No scleral icterus.  Neck: Normal range of motion. Neck supple. No thyromegaly present.  Cardiovascular: Normal rate and regular rhythm.  Exam reveals no gallop and no friction rub.   No murmur heard. Pulmonary/Chest: Effort normal and breath sounds normal. No respiratory distress. She has no wheezes. She has no rales.  Abdominal: Soft. Bowel sounds are normal. She exhibits no distension. There is no tenderness. There is no rebound.  Musculoskeletal: Normal range of motion.       Hands: Neurological: She is alert and oriented to person, place, and time.  Skin: Skin is warm and dry. No rash noted.  Psychiatric: She has a normal mood and affect. Her behavior is normal.    ED Course  Procedures (including critical care time) DIAGNOSTIC STUDIES: Oxygen Saturation is 97% on RA, normal by my interpretation.    COORDINATION OF CARE: 9:09 PM-Discussed treatment plan which includes antibiotics with pt at bedside and pt agreed to plan.     Labs Review Labs Reviewed - No data to display  Imaging Review No results found.   EKG Interpretation None      MDM   Final diagnoses:  Cellulitis of right upper extremity  Lymphangitis         I personally  performed the services described in this documentation, which was scribed in my presence. The recorded information has been reviewed and is accurate.      Rolland Porter, MD 04/19/14 2118

## 2014-04-19 NOTE — Discharge Instructions (Signed)
Recheck with your primary care physician or Return to ER if not markedly improved over the next 48 hours.  ellulitis Cellulitis is an infection of the skin and the tissue under the skin. The infected area is usually red and tender. This happens most often in the arms and lower legs. HOME CARE   Take your antibiotic medicine as told. Finish the medicine even if you start to feel better.  Keep the infected arm or leg raised (elevated).  Put a warm cloth on the area up to 4 times per day.  Only take medicines as told by your doctor.  Keep all doctor visits as told. GET HELP IF:  You see red streaks on the skin coming from the infected area.  Your red area gets bigger or turns a dark color.  Your bone or joint under the infected area is painful after the skin heals.  Your infection comes back in the same area or different area.  You have a puffy (swollen) bump in the infected area.  You have new symptoms.  You have a fever. GET HELP RIGHT AWAY IF:   You feel very sleepy.  You throw up (vomit) or have watery poop (diarrhea).  You feel sick and have muscle aches and pains. MAKE SURE YOU:   Understand these instructions.  Will watch your condition.  Will get help right away if you are not doing well or get worse. Document Released: 01/31/2008 Document Revised: 12/29/2013 Document Reviewed: 10/30/2011 [facility] Patient Information 2015 [place], [place]. This information is not intended to replace advice given to you by your health care provider. Make sure you discuss any questions you have with your health care provider.

## 2014-04-19 NOTE — ED Notes (Signed)
Pt reports rt hand discomfort that began yesterday - pt is also experiencing progressively worse - pt denies any mechanism of injury - erythremic streaking also noted up pt's rt posterior FA as well.

## 2014-05-29 ENCOUNTER — Other Ambulatory Visit: Payer: Self-pay | Admitting: Family Medicine

## 2014-08-14 ENCOUNTER — Other Ambulatory Visit: Payer: Self-pay | Admitting: Internal Medicine

## 2014-08-14 ENCOUNTER — Telehealth: Payer: Self-pay | Admitting: Family Medicine

## 2014-08-14 NOTE — Telephone Encounter (Signed)
Pt daughter will callback to sch

## 2014-08-14 NOTE — Telephone Encounter (Signed)
Scheduled for 08/25/14 at 0945.

## 2014-08-14 NOTE — Telephone Encounter (Signed)
Sent to the pharmacy by e-scribe.  Pt is past due for her med check.  Will send a message to scheduling to help the pt get on the schedule.

## 2014-08-14 NOTE — Telephone Encounter (Signed)
Patient is past due for her med check.  Should have returned in November.  Please help the pt make an appt.  I have sent in a one month supply of potassium chloride (K-DUR) 10 MEQ tablet. Thanks!

## 2014-08-25 ENCOUNTER — Ambulatory Visit (INDEPENDENT_AMBULATORY_CARE_PROVIDER_SITE_OTHER): Payer: Medicare Other | Admitting: Internal Medicine

## 2014-08-25 ENCOUNTER — Encounter: Payer: Self-pay | Admitting: Internal Medicine

## 2014-08-25 VITALS — BP 116/70 | Temp 98.1°F | Ht 64.0 in | Wt 171.0 lb

## 2014-08-25 DIAGNOSIS — H9190 Unspecified hearing loss, unspecified ear: Secondary | ICD-10-CM

## 2014-08-25 DIAGNOSIS — R4189 Other symptoms and signs involving cognitive functions and awareness: Secondary | ICD-10-CM

## 2014-08-25 DIAGNOSIS — E876 Hypokalemia: Secondary | ICD-10-CM

## 2014-08-25 DIAGNOSIS — I10 Essential (primary) hypertension: Secondary | ICD-10-CM

## 2014-08-25 DIAGNOSIS — Z79899 Other long term (current) drug therapy: Secondary | ICD-10-CM

## 2014-08-25 LAB — BASIC METABOLIC PANEL
BUN: 23 mg/dL (ref 6–23)
CALCIUM: 10.6 mg/dL — AB (ref 8.4–10.5)
CO2: 33 mEq/L — ABNORMAL HIGH (ref 19–32)
Chloride: 102 mEq/L (ref 96–112)
Creatinine, Ser: 0.7 mg/dL (ref 0.4–1.2)
GFR: 85.27 mL/min (ref >60.00)
GLUCOSE: 95 mg/dL (ref 70–99)
Potassium: 3.9 mEq/L (ref 3.5–5.1)
Sodium: 141 mEq/L (ref 135–145)

## 2014-08-25 LAB — MAGNESIUM: MAGNESIUM: 1.5 mg/dL (ref 1.5–2.5)

## 2014-08-25 NOTE — Patient Instructions (Addendum)
Advise to proceed with hearing assistance .    Will notify you  of labs when available.  potassium level. Wellness  Other visit in 6 months or as needed

## 2014-08-25 NOTE — Progress Notes (Signed)
Pre visit review using our clinic review tool, if applicable. No additional management support is needed unless otherwise documented below in the visit note.  Chief Complaint  Patient presents with  . Med Check    medicine  . Hypertension    HPI: Heide ScalesBetty G Dry 80 y.o. comes in for Chronic disease management Since last visit had shingles an cellulutus  right arm  Here with daughter   Bp good  Mood :  Good enough on meds  Hearing :   Dr Dorma Russellkraus   Advised hearing aids  Deferred this.  But  Seems to be effecting her   Social interactions Cognition  lsight decline still driving lost one day .  Not at limit night  Not far.    ROS: See pertinent positives and negatives per HPI. No falling cp sob edema still thinks legs look bad  No swelling  Past Medical History  Diagnosis Date  . Cellulitis of left leg   . Eczema   . Hypertension   . Mild cognitive impairment   . Osteoarthritis   . Polymyalgia rheumatica     rx with prednisone  . Varicose veins   . Xerosis of skin   . Urinary incontinence   . History of renal stone     excision    Family History  Problem Relation Age of Onset  . Melanoma Mother     [id number]  . Stroke Father     4789  . Osteoporosis Other     History   Social History  . Marital Status: Widowed    Spouse Name: N/A    Number of Children: N/A  . Years of Education: N/A   Occupational History  . retired Runner, broadcasting/film/videoteacher    Social History Main Topics  . Smoking status: Former Smoker    Start date: 08/07/1944    Quit date: 10/13/1945  . Smokeless tobacco: Former NeurosurgeonUser    Quit date: 10/01/1945  . Alcohol Use: Yes     Comment: social  . Drug Use: No  . Sexual Activity: None   Other Topics Concern  . None   Social History Narrative   Widowed remote hx of tobacco x 4 years   Living at friends home.   G4P3   Daughters have HCPOA  If needed             Outpatient Encounter Prescriptions as of 08/25/2014  Medication Sig  . ALREX 0.2 % SUSP   . Ascorbic  Acid (VITAMIN C) 100 MG tablet Take 100 mg by mouth every morning.   Marland Kitchen. atenolol-chlorthalidone (TENORETIC) 50-25 MG per tablet TAKE ONE TABLET BY MOUTH EVERY DAY  . calcium carbonate (OS-CAL - DOSED IN MG OF ELEMENTAL CALCIUM) 1250 MG tablet Take 1 tablet by mouth every morning.  . cholecalciferol (VITAMIN D) 1000 UNITS tablet Take 1,000 Units by mouth every morning.   . donepezil (ARICEPT) 10 MG tablet TAKE ONE TABLET BY MOUTH NIGHTLY  . mirabegron ER (MYRBETRIQ) 50 MG TB24 tablet Take 50 mg by mouth daily.  . Multiple Vitamin (MULTIVITAMIN WITH MINERALS) TABS Take 1 tablet by mouth every morning.  . potassium chloride (K-DUR) 10 MEQ tablet TAKE TWO TABLETS BY MOUTH EVERY DAY  . predniSONE (DELTASONE) 1 MG tablet Take 2 mg by mouth every morning.   . sertraline (ZOLOFT) 50 MG tablet TAKE ONE TABLET BY MOUTH EVERY DAY  . valACYclovir (VALTREX) 1000 MG tablet Take 1 tablet (1,000 mg total) by mouth 3 (three) times daily. (Patient  not taking: Reported on 08/25/2014)  . [DISCONTINUED] amoxicillin-clavulanate (AUGMENTIN) 875-125 MG per tablet Take 1 tablet by mouth 2 (two) times daily.  . [DISCONTINUED] solifenacin (VESICARE) 10 MG tablet Take 10 mg by mouth every morning.   . [DISCONTINUED] triamcinolone cream (KENALOG) 0.1 % Apply 1 application topically 2 (two) times daily.    EXAM:  BP 116/70 mmHg  Temp(Src) 98.1 F (36.7 C) (Oral)  Ht 5\' 4"  (1.626 m)  Wt 171 lb (77.565 kg)  BMI 29.34 kg/m2  Body mass index is 29.34 kg/(m^2).  GENERAL: vitals reviewed and listed above, alert, oriented, appears well hydrated and in no acute distress HEENT: atraumatic, conjunctiva  clear, no obvious abnormalities on inspection of external nose and ears OP : no lesion edema or exudate  NECK: no obvious masses on inspection palpation  LUNGS: clear to auscultation bilaterally, no wheezes, rales or rhonchi, good air movement CV: HRRR, no clubbing cyanosis or  peripheral edema nl cap refill  MS: moves all  extremities without noticeable focal  Abnormality  Old pigment scars anterior no lesinos PSYCH: pleasant and cooperative, no obvious depression or anxiety Last bmo k 3.4   ASSESSMENT AND PLAN:  Discussed the following assessment and plan:  Essential hypertension - Plan: Basic metabolic panel, Magnesium  Cognitive deficits  Medication management  Hypokalemia - Plan: Basic metabolic panel, Magnesium  Decreased hearing, unspecified laterality Repeat potassium  Mag  -Patient advised to return or notify health care team  if symptoms worsen ,persist or new concerns arise.  Patient Instructions  Advise to proceed with hearing assistance .    Will notify you  of labs when available.  potassium level. Wellness  Other visit in 6 months or as needed      Neta MendsWanda K. Kyriakos Babler M.D. Total visit 25mins > 50% spent counseling and coordinating care

## 2014-09-07 ENCOUNTER — Other Ambulatory Visit: Payer: Self-pay | Admitting: Internal Medicine

## 2014-09-28 ENCOUNTER — Other Ambulatory Visit: Payer: Self-pay | Admitting: Internal Medicine

## 2014-09-28 NOTE — Telephone Encounter (Signed)
Sent to the pharmacy by e-scribe. 

## 2014-10-30 ENCOUNTER — Other Ambulatory Visit: Payer: Self-pay | Admitting: Internal Medicine

## 2014-11-16 ENCOUNTER — Encounter (HOSPITAL_COMMUNITY): Payer: Self-pay | Admitting: Emergency Medicine

## 2014-11-16 ENCOUNTER — Emergency Department (HOSPITAL_COMMUNITY): Payer: 59

## 2014-11-16 ENCOUNTER — Emergency Department (HOSPITAL_COMMUNITY)
Admission: EM | Admit: 2014-11-16 | Discharge: 2014-11-16 | Disposition: A | Discharge status: Home / Self Care | Payer: 59 | Attending: Emergency Medicine | Admitting: Emergency Medicine

## 2014-11-16 DIAGNOSIS — Z87442 Personal history of urinary calculi: Secondary | ICD-10-CM | POA: Diagnosis not present

## 2014-11-16 DIAGNOSIS — I10 Essential (primary) hypertension: Secondary | ICD-10-CM | POA: Insufficient documentation

## 2014-11-16 DIAGNOSIS — Z8669 Personal history of other diseases of the nervous system and sense organs: Secondary | ICD-10-CM | POA: Insufficient documentation

## 2014-11-16 DIAGNOSIS — Z7952 Long term (current) use of systemic steroids: Secondary | ICD-10-CM | POA: Insufficient documentation

## 2014-11-16 DIAGNOSIS — Z872 Personal history of diseases of the skin and subcutaneous tissue: Secondary | ICD-10-CM | POA: Diagnosis not present

## 2014-11-16 DIAGNOSIS — Z79899 Other long term (current) drug therapy: Secondary | ICD-10-CM | POA: Diagnosis not present

## 2014-11-16 DIAGNOSIS — M1990 Unspecified osteoarthritis, unspecified site: Secondary | ICD-10-CM | POA: Diagnosis not present

## 2014-11-16 DIAGNOSIS — M353 Polymyalgia rheumatica: Secondary | ICD-10-CM

## 2014-11-16 DIAGNOSIS — Z87891 Personal history of nicotine dependence: Secondary | ICD-10-CM | POA: Insufficient documentation

## 2014-11-16 DIAGNOSIS — M25512 Pain in left shoulder: Secondary | ICD-10-CM | POA: Insufficient documentation

## 2014-11-16 LAB — CBC WITH DIFFERENTIAL/PLATELET
BASOS ABS: 0 10*3/uL (ref 0.0–0.1)
BASOS PCT: 0 % (ref 0–1)
Eosinophils Absolute: 0 10*3/uL (ref 0.0–0.7)
Eosinophils Relative: 0 % (ref 0–5)
HCT: 44.3 % (ref 36.0–46.0)
Hemoglobin: 15.3 g/dL — ABNORMAL HIGH (ref 12.0–15.0)
LYMPHS PCT: 15 % (ref 12–46)
Lymphs Abs: 1.7 10*3/uL (ref 0.7–4.0)
MCH: 29.5 pg (ref 26.0–34.0)
MCHC: 34.5 g/dL (ref 30.0–36.0)
MCV: 85.4 fL (ref 78.0–100.0)
MONO ABS: 0.7 10*3/uL (ref 0.1–1.0)
Monocytes Relative: 6 % (ref 3–12)
NEUTROS ABS: 8.5 10*3/uL — AB (ref 1.7–7.7)
Neutrophils Relative %: 79 % — ABNORMAL HIGH (ref 43–77)
Platelets: 231 10*3/uL (ref 150–400)
RBC: 5.19 MIL/uL — AB (ref 3.87–5.11)
RDW: 13.5 % (ref 11.5–15.5)
WBC: 10.9 10*3/uL — AB (ref 4.0–10.5)

## 2014-11-16 LAB — BASIC METABOLIC PANEL
ANION GAP: 10 (ref 5–15)
BUN: 21 mg/dL (ref 6–23)
CALCIUM: 10.8 mg/dL — AB (ref 8.4–10.5)
CO2: 30 mmol/L (ref 19–32)
Chloride: 99 mmol/L (ref 96–112)
Creatinine, Ser: 0.65 mg/dL (ref 0.50–1.10)
GFR calc Af Amer: 89 mL/min — ABNORMAL LOW (ref >90)
GFR calc non Af Amer: 77 mL/min — ABNORMAL LOW (ref >90)
Glucose, Bld: 114 mg/dL — ABNORMAL HIGH (ref 70–99)
POTASSIUM: 3 mmol/L — AB (ref 3.5–5.1)
Sodium: 139 mmol/L (ref 135–145)

## 2014-11-16 LAB — I-STAT TROPONIN, ED: Troponin i, poc: 0 ng/mL (ref 0.00–0.08)

## 2014-11-16 MED ORDER — PREDNISONE 10 MG PO TABS: 30.0000 mg | ORAL_TABLET | Freq: Every day | ORAL | Status: AC

## 2014-11-16 NOTE — Discharge Instructions (Signed)
Polymyalgia Rheumatica Polymyalgia rheumatica (also called PMR or polymyalgia) is a rheumatologic (arthritic) condition that causes pain and morning stiffness in your neck, shoulders, and hips. It is an inflammatory condition. In some people, inflammation of certain structures in the shoulder, hips, or other joints can be seen on special testing. It does not cause joint destruction, as occurs in other arthritic conditions. It usually occurs after 79 years of age, and is more common as you age. It can be confused with several other diseases, but it is usually easily treated. People with PMR often have, or can develop, a more severe rheumatologic condition called giant cell arteritis (also called CGA or temporal arteritis).  CAUSES  The exact cause of PMR is not known.   There are genetic factors involved.  Viruses have been suspected in the cause of PMR. This has not been proven. SYMPTOMS   Aching, pain, and morning stiffness your neck, both shoulders, or both hips.  Symptoms usually start slowly and build gradually.  Morning stiffness usually lasts at least 30 minutes.  Swelling and tenderness in other joints of the arms, hands, legs, and feet may occur.  Swelling and inflammation in the wrists can cause nerve inflammation at the wrist (carpal tunnel syndrome).  You may also have low grade fever, fatigue, weakness, decreased appetite and weight loss. DIAGNOSIS   Your caregiver may suspect that you have PMR based on your description of your symptoms and on your exam.  Your caregiver will examine you to be sure you do not have diseases that can be confused with PMR. These diseases include rheumatoid arthritis, fibromyalgia, or thyroid disease.  Your caregiver should check for signs of giant cell arteritis. This can cause serious complications such as blindness.  Lab tests can help confirm that you have PMR and not other diseases, but are sometimes inconclusive.  X-rays cannot show PMR.  However, it can identify other diseases like rheumatoid arthritis. Your caregiver may have you see a specialist in arthritis and inflammatory diseases (rheumatologist). TREATMENT  The goal of treatment is relief of symptoms. Treatment does not shorten the course of the illness or prevent complications. With proper treatment, you usually feel better almost right away.   The initial treatment of PMR is usually a cortisone (steroid) medication. Your caregiver will help determine a starting dose. The dose is gradually reduced every few weeks to months. Treatment usually lasts one to three years.  Other stronger medications are rarely needed. They will only be prescribed if your symptoms do not get better on cortisone medication alone, or if they recur as the dose is reduced.  Cortisone medication can have different side effects. With the doses of cortisone needed for PMR, the side effects can affect bones and joints, blood sugar control in diabetes, and mood changes. Discuss this with your caregiver.  Your caregiver will evaluate you regularly during your treatment. They will do this in order to assess progress and to check for complications of the illness or treatment.  Physical therapy is sometimes useful. This is especially true if your joints are still stiff after other symptoms have improved. HOME CARE INSTRUCTIONS   Follow your caregiver's instructions. Do not change your dose of cortisone medication on your own.  Keep your appointments for follow-up lab tests and caregiver visits. Your lab tests need to be monitored. You must get checked periodically for giant cell arteritis.  Follow your caregiver's guidance regarding physical activity (usually no restrictions are needed) or physical therapy.  Your caregiver  may have instructions to prevent or check for side effects from cortisone medication (including bone density testing or treatment). Follow their instructions carefully. SEEK MEDICAL  CARE IF:   You develop any side effects from treatment. Side effects can include:  Elevated blood pressure.  High blood sugar (or worsening of diabetes, if you are diabetic).  Difficulty fighting off infections.  Weight gain.  Weakness of the bones (osteoporosis).  Your aches, pains, morning stiffness, or other symptoms get worse with time. This is especially true after your dose of cortisone is reduced.  You develop new joint symptoms (pain, swelling, etc.) SEEK IMMEDIATE MEDICAL CARE IF:   You develop a severe headache.  You start vomiting.  You have problems with your vision.  You have an oral temperature above 102 F (38.9 C), not controlled by medicine. Document Released: 09/21/2004 Document Revised: 07/31/2012 Document Reviewed: 01/04/2009 [facility] Patient Information 2015 [place], [place]. This information is not intended to replace advice given to you by your health care provider. Make sure you discuss any questions you have with your health care provider.  Shoulder Pain The shoulder is the joint that connects your arms to your body. The bones that form the shoulder joint include the upper arm bone (humerus), the shoulder blade (scapula), and the collarbone (clavicle). The top of the humerus is shaped like a ball and fits into a rather flat socket on the scapula (glenoid cavity). A combination of muscles and strong, fibrous tissues that connect muscles to bones (tendons) support your shoulder joint and hold the ball in the socket. Small, fluid-filled sacs (bursae) are located in different areas of the joint. They act as cushions between the bones and the overlying soft tissues and help reduce friction between the gliding tendons and the bone as you move your arm. Your shoulder joint allows a wide range of motion in your arm. This range of motion allows you to do things like scratch your back or throw a ball. However, this range of motion also makes your shoulder more prone to  pain from overuse and injury. Causes of shoulder pain can originate from both injury and overuse and usually can be grouped in the following four categories:  Redness, swelling, and pain (inflammation) of the tendon (tendinitis) or the bursae (bursitis).  Instability, such as a dislocation of the joint.  Inflammation of the joint (arthritis).  Broken bone (fracture). HOME CARE INSTRUCTIONS   Apply ice to the sore area.  Put ice in a plastic bag.  Place a towel between your skin and the bag.  Leave the ice on for 15-20 minutes, 3-4 times per day for the first 2 days, or as directed by your health care provider.  Stop using cold packs if they do not help with the pain.  If you have a shoulder sling or immobilizer, wear it as long as your caregiver instructs. Only remove it to shower or bathe. Move your arm as little as possible, but keep your hand moving to prevent swelling.  Squeeze a soft ball or foam pad as much as possible to help prevent swelling.  Only take over-the-counter or prescription medicines for pain, discomfort, or fever as directed by your caregiver. SEEK MEDICAL CARE IF:   Your shoulder pain increases, or new pain develops in your arm, hand, or fingers.  Your hand or fingers become cold and numb.  Your pain is not relieved with medicines. SEEK IMMEDIATE MEDICAL CARE IF:   Your arm, hand, or fingers are numb or  tingling.  Your arm, hand, or fingers are significantly swollen or turn white or blue. MAKE SURE YOU:   Understand these instructions.  Will watch your condition.  Will get help right away if you are not doing well or get worse. Document Released: 05/24/2005 Document Revised: 12/29/2013 Document Reviewed: 07/29/2011 [facility] Patient Information 2015 [place]. This information is not intended to replace advice given to you by your health care provider. Make sure you discuss any questions you have with your health care provider.

## 2014-11-16 NOTE — ED Notes (Signed)
Dr.Nickle at bedside  

## 2014-11-16 NOTE — ED Provider Notes (Signed)
CSN: [id number]     Arrival date & time 11/16/14  1252 History   First MD Initiated Contact with Patient 11/16/14 1537     Chief Complaint  Patient presents with  . Chest Pain  . Shoulder Pain     (Consider location/radiation/quality/duration/timing/severity/associated sxs/prior Treatment) HPI Comments: 81 year old female with history of polymyalgia rheumatica presents with left shoulder pain. Symptoms started today. Extended to the left upper chest but not into the chest. No shortness of breath. Worse with range of motion and palpation. She has a history of similar pain in the past related to her polymyalgia. She is on chronic steroids. Aleve was tried without significant pain relief with the Aleve.  Patient is a 81 y.o. female presenting with shoulder pain. The history is provided by the patient and a caregiver (daughter).  Shoulder Pain Location:  Shoulder Time since incident:  1 day Pain details:    Quality:  Aching   Radiates to:  Does not radiate   Severity:  Moderate   Onset quality:  Gradual   Duration:  1 day   Timing:  Constant   Progression:  Improving Chronicity:  New Handedness:  Right-handed Dislocation: no   Foreign body present:  No foreign bodies Tetanus status:  Up to date Prior injury to area:  No Relieved by: alleve. Worsened by:  Bearing weight and movement Associated symptoms: decreased range of motion and stiffness   Associated symptoms: no fever, no numbness, no swelling and no tingling     Past Medical History  Diagnosis Date  . Cellulitis of left leg   . Eczema   . Hypertension   . Mild cognitive impairment   . Osteoarthritis   . Polymyalgia rheumatica     rx with prednisone  . Varicose veins   . Xerosis of skin   . Urinary incontinence   . History of renal stone     excision   Past Surgical History  Procedure Laterality Date  . Joint replacement    . Kidney stone surgey    . Cataract surgery    . Lipoma removal     Family History   Problem Relation Age of Onset  . Melanoma Mother     54  . Stroke Father     50  . Osteoporosis Other    History  Substance Use Topics  . Smoking status: Former Smoker    Start date: 08/07/1944    Quit date: 10/13/1945  . Smokeless tobacco: Former Neurosurgeon    Quit date: 10/01/1945  . Alcohol Use: Yes     Comment: social   OB History    No data available     Review of Systems  Constitutional: Negative for fever.  Musculoskeletal: Positive for stiffness.  All other systems reviewed and are negative.     Allergies  Review of patient's allergies indicates no active allergies.  Home Medications   Prior to Admission medications   Medication Sig Start Date End Date Taking? Authorizing Provider  ALREX 0.2 % SUSP Place 1 drop into both eyes daily.  01/20/14  Yes Historical Provider, MD  Ascorbic Acid (VITAMIN C) 100 MG tablet Take 100 mg by mouth every morning.    Yes Historical Provider, MD  atenolol-chlorthalidone (TENORETIC) 50-25 MG per tablet TAKE ONE TABLET BY MOUTH EVERY DAY   Yes Madelin Headings, MD  calcium carbonate (OS-CAL - DOSED IN MG OF ELEMENTAL CALCIUM) 1250 MG tablet Take 1 tablet by mouth every morning.   Yes Historical  Provider, MD  cholecalciferol (VITAMIN D) 1000 UNITS tablet Take 1,000 Units by mouth every morning.    Yes Historical Provider, MD  donepezil (ARICEPT) 10 MG tablet TAKE ONE TABLET BY MOUTH NIGHTLY 10/30/14  Yes Madelin HeadingsWanda K Panosh, MD  mirabegron ER (MYRBETRIQ) 50 MG TB24 tablet Take 50 mg by mouth daily.   Yes Crist FatBenjamin W Herrick, MD  Multiple Vitamin (MULTIVITAMIN WITH MINERALS) TABS Take 1 tablet by mouth every morning.   Yes Historical Provider, MD  potassium chloride (K-DUR) 10 MEQ tablet Take 10 mEq by mouth daily.   Yes Historical Provider, MD  predniSONE (DELTASONE) 1 MG tablet Take 2 mg by mouth every morning.    Yes Alben DeedsJames Beekman, MD  sertraline (ZOLOFT) 50 MG tablet TAKE ONE TABLET BY MOUTH EVERY DAY 09/07/14  Yes Madelin HeadingsWanda K Panosh, MD  potassium  chloride (K-DUR) 10 MEQ tablet TAKE TWO TABLETS BY MOUTH EVERY DAY Patient not taking: Reported on 11/16/2014 09/28/14   Madelin HeadingsWanda K Panosh, MD  predniSONE (DELTASONE) 10 MG tablet Take 3 tablets (30 mg total) by mouth daily. 11/17/14 11/19/14  Dorna LeitzAlex Juanjesus Pepperman, MD  valACYclovir (VALTREX) 1000 MG tablet Take 1 tablet (1,000 mg total) by mouth 3 (three) times daily. Patient not taking: Reported on 08/25/2014 04/10/14   Carmelina DaneJeffery S Anderson, MD   BP 113/49 mmHg  Pulse 45  Temp(Src) 97.9 F (36.6 C) (Oral)  Resp 20  SpO2 93% Physical Exam  Constitutional: She is oriented to person, place, and time. She appears well-developed and well-nourished. No distress.  HENT:  Head: Normocephalic and atraumatic.  Mouth/Throat: No oropharyngeal exudate.  Eyes: Conjunctivae are normal. Pupils are equal, round, and reactive to light.  Neck: Normal range of motion. Neck supple.  Cardiovascular: Normal rate, regular rhythm and intact distal pulses.  Exam reveals no gallop and no friction rub.   No murmur heard. Pulmonary/Chest: Effort normal and breath sounds normal. No respiratory distress. She has no wheezes. She has no rales.  Abdominal: Soft. She exhibits no distension. There is no tenderness.  Musculoskeletal: Normal range of motion. She exhibits no edema or tenderness.  Left shoulder with full range of motion, no point tenderness. No erythema, warmth, swelling. Left arm neurovascularly intact. 5/5 strength.  Neurological: She is alert and oriented to person, place, and time.  Skin: Skin is warm and dry. No rash noted. She is not diaphoretic.  Psychiatric: She has a normal mood and affect. Her behavior is normal. Judgment and thought content normal.  Nursing note and vitals reviewed.   ED Course  Procedures (including critical care time) Labs Review Labs Reviewed  BASIC METABOLIC PANEL - Abnormal; Notable for the following:    Potassium 3.0 (*)    Glucose, Bld 114 (*)    Calcium 10.8 (*)    GFR calc non Af  Amer 77 (*)    GFR calc Af Amer 89 (*)    All other components within normal limits  CBC WITH DIFFERENTIAL/PLATELET - Abnormal; Notable for the following:    WBC 10.9 (*)    RBC 5.19 (*)    Hemoglobin 15.3 (*)    Neutrophils Relative % 79 (*)    Neutro Abs 8.5 (*)    All other components within normal limits  I-STAT TROPOININ, ED    Imaging Review Dg Chest 2 View  11/16/2014   CLINICAL DATA:  Left-sided chest and shoulder pain today.  EXAM: CHEST  2 VIEW  COMPARISON:  03/24/2013; 02/07/2009  FINDINGS: Grossly unchanged cardiac silhouette and mediastinal  contours with atherosclerotic plaque within the thoracic aorta. Left basilar linear heterogeneous opacities are unchanged and favored to represent atelectasis or scar. No new focal airspace opacities. No pleural effusion or pneumothorax. No evidence of edema. No acute osseus abnormalities. Degenerative change of the bilateral acromioclavicular joints, right greater than left. Mild scoliotic curvature of the thoracolumbar spine.  IMPRESSION: Left basilar atelectasis/scar without acute cardiopulmonary disease.   Electronically Signed   By: Simonne Come M.D.   On: 11/16/2014 14:39     EKG Interpretation   Date/Time:  Monday November 16 2014 12:58:07 EDT Ventricular Rate:  49 PR Interval:  168 QRS Duration: 94 QT Interval:  440 QTC Calculation: 397 R Axis:   -21 Text Interpretation:  Sinus bradycardia with sinus arrhythmia Otherwise  normal ECG Abnormal ekg Confirmed by BEATON  MD, ROBERT ([id number]) on  11/16/2014 4:06:18 PM      MDM   Final diagnoses:  Left shoulder pain  Polymyalgia rheumatica syndrome   81 year old female with left shoulder pain consistent with previous polymyalgia. Chest x-ray, EKG reassuring. Troponin negative. Despite age, no significant cardiac history and presentation is not consistent with cardiac etiology to shoulder pain. No further workup as this is similar and consistent with her previous polymyalgia  rheumatica. We will treat with anti-inflammatories and prednisone burst. PCP follow-up.    Dorna Leitz, MD 11/16/14 1717  Nelva Nay, MD 11/21/14 [phone]

## 2014-11-16 NOTE — ED Notes (Signed)
Pt c/o left sided pain in back, shoulder and chest starting today only with movement; pt denies SOB; pt sts hx of chronic pain in that area

## 2014-11-25 ENCOUNTER — Other Ambulatory Visit: Payer: Self-pay | Admitting: Internal Medicine

## 2014-11-25 NOTE — Telephone Encounter (Signed)
Filled for 1 year on 01/30/14.  Request is too early.

## 2014-12-26 ENCOUNTER — Other Ambulatory Visit: Payer: Self-pay | Admitting: Internal Medicine

## 2014-12-28 NOTE — Telephone Encounter (Signed)
Sent to the pharmacy by e-scribe. 

## 2015-01-21 ENCOUNTER — Other Ambulatory Visit: Payer: Self-pay | Admitting: Internal Medicine

## 2015-01-21 ENCOUNTER — Telehealth: Payer: Self-pay | Admitting: Family Medicine

## 2015-01-21 NOTE — Telephone Encounter (Signed)
Please get appt in next available opening.  Refilled for 30 days only for now.

## 2015-01-21 NOTE — Telephone Encounter (Signed)
Pt is now due for her medicare wellness exam.  Please help her to make an appt.  I have refilled her atenolol-chlorthalidone (TENORETIC) 50-25 MG per tablet for 30 days.

## 2015-01-21 NOTE — Telephone Encounter (Signed)
Tricia Mendez I will not be able to get her medicare wellness exam in 30 day unless you would like for me to create 30 min appt

## 2015-01-21 NOTE — Telephone Encounter (Signed)
#  30 sent to the pharmacy by e-scribe.  Pt now due for wellness.  Will send a message to scheduling.

## 2015-01-22 NOTE — Telephone Encounter (Signed)
Pt has been sch

## 2015-02-03 ENCOUNTER — Telehealth: Payer: Self-pay | Admitting: Internal Medicine

## 2015-02-03 ENCOUNTER — Ambulatory Visit (INDEPENDENT_AMBULATORY_CARE_PROVIDER_SITE_OTHER): Payer: Medicare Other | Admitting: Internal Medicine

## 2015-02-03 ENCOUNTER — Ambulatory Visit (INDEPENDENT_AMBULATORY_CARE_PROVIDER_SITE_OTHER)
Admission: RE | Admit: 2015-02-03 | Discharge: 2015-02-03 | Disposition: A | Discharge status: Home / Self Care | Payer: 59 | Source: Ambulatory Visit | Attending: Internal Medicine | Admitting: Internal Medicine

## 2015-02-03 ENCOUNTER — Encounter: Payer: Self-pay | Admitting: Internal Medicine

## 2015-02-03 VITALS — BP 132/76 | HR 60 | Temp 100.4°F | Wt 159.1 lb

## 2015-02-03 DIAGNOSIS — J22 Unspecified acute lower respiratory infection: Secondary | ICD-10-CM

## 2015-02-03 DIAGNOSIS — R4189 Other symptoms and signs involving cognitive functions and awareness: Secondary | ICD-10-CM | POA: Diagnosis not present

## 2015-02-03 DIAGNOSIS — J988 Other specified respiratory disorders: Secondary | ICD-10-CM

## 2015-02-03 DIAGNOSIS — R5081 Fever presenting with conditions classified elsewhere: Secondary | ICD-10-CM

## 2015-02-03 DIAGNOSIS — R54 Age-related physical debility: Secondary | ICD-10-CM

## 2015-02-03 DIAGNOSIS — IMO0001 Reserved for inherently not codable concepts without codable children: Secondary | ICD-10-CM

## 2015-02-03 NOTE — Telephone Encounter (Signed)
Please have MD Panosh advise if patient needs appointment or if calling something in is appropriate

## 2015-02-03 NOTE — Telephone Encounter (Signed)
Pt has been sch

## 2015-02-03 NOTE — Telephone Encounter (Signed)
[facility]

## 2015-02-03 NOTE — Progress Notes (Signed)
Pre visit review using our clinic review tool, if applicable. No additional management support is needed unless otherwise documented below in the visit note.   Chief Complaint  Patient presents with  . Fever    X2Days  . Cough  . Nasal Congestion    HPI: Patient Tricia Mendez  comes in today for SDA for  new problem evaluation. Here w ith sister .   Acute onset 2 days ago of runny nose malaise and cough. No noted fever and chills documented has memory problems. No pain but some decrease appetite. Tricia Mendez is in hospice dying or Tricia Mendez would be here. Patient is still concerned about the way her legs look but no change minor hits we'll make a bruise. No chest pain shortness of breath wheezing hemoptysis. No known other exposures.  ROS: See pertinent positives and negatives per HPI.  Past Medical History  Diagnosis Date  . Cellulitis of left leg   . Eczema   . Hypertension   . Mild cognitive impairment   . Osteoarthritis   . Polymyalgia rheumatica     rx with prednisone  . Varicose veins   . Xerosis of skin   . Urinary incontinence   . History of renal stone     excision    Family History  Problem Relation Age of Onset  . Melanoma Mother     37  . Stroke Father     58  . Osteoporosis Other     History   Social History  . Marital Status: Widowed    Spouse Name: N/A  . Number of Children: N/A  . Years of Education: N/A   Occupational History  . retired Runner, broadcasting/film/video    Social History Main Topics  . Smoking status: Former Smoker    Start date: 08/07/1944    Quit date: 10/13/1945  . Smokeless tobacco: Former Neurosurgeon    Quit date: 10/01/1945  . Alcohol Use: Yes     Comment: social  . Drug Use: No  . Sexual Activity: Not on file   Other Topics Concern  . None   Social History Narrative   Widowed remote hx of tobacco x 4 years   Living at friends home.   G4P3   Daughters have HCPOA  If needed             Outpatient Prescriptions Prior to Visit   Medication Sig Dispense Refill  . ALREX 0.2 % SUSP Place 1 drop into both eyes daily.     . Ascorbic Acid (VITAMIN C) 100 MG tablet Take 100 mg by mouth every morning.     Marland Kitchen atenolol-chlorthalidone (TENORETIC) 50-25 MG per tablet TAKE 1 TABLET BY MOUTH EVERY DAY 30 tablet 0  . calcium carbonate (OS-CAL - DOSED IN MG OF ELEMENTAL CALCIUM) 1250 MG tablet Take 1 tablet by mouth every morning.    . cholecalciferol (VITAMIN D) 1000 UNITS tablet Take 1,000 Units by mouth every morning.     . donepezil (ARICEPT) 10 MG tablet TAKE ONE TABLET BY MOUTH NIGHTLY 90 tablet 0  . mirabegron ER (MYRBETRIQ) 50 MG TB24 tablet Take 50 mg by mouth daily.    . Multiple Vitamin (MULTIVITAMIN WITH MINERALS) TABS Take 1 tablet by mouth every morning.    . potassium chloride (K-DUR) 10 MEQ tablet Take 10 mEq by mouth daily.    . potassium chloride (K-DUR) 10 MEQ tablet TAKE TWO TABLETS BY MOUTH EVERY DAY 60 tablet 0  . predniSONE (DELTASONE) 1 MG tablet  Take 2 mg by mouth every morning.     . sertraline (ZOLOFT) 50 MG tablet TAKE ONE TABLET BY MOUTH EVERY DAY 30 tablet 0  . valACYclovir (VALTREX) 1000 MG tablet Take 1 tablet (1,000 mg total) by mouth 3 (three) times daily. (Patient not taking: Reported on 08/25/2014) 21 tablet 0   No facility-administered medications prior to visit.     EXAM:  BP 132/76 mmHg  Pulse 60  Temp(Src) 100.4 F (38 C) (Oral)  Wt 159 lb 1.6 oz (72.167 kg)  SpO2 96%  Body mass index is 27.3 kg/(m^2).  GENERAL: vitals reviewed and listed above, alert, oriented, appears well hydrated and in no acute distress she has diffuse rhinorrhea using a tissue but in no respiratory distress and nontoxic. HEENT: atraumatic, conjunctiva  clear, no obvious abnormalities on inspection of external nose and ears very runny nose clear eyes clear face nontender OP : no lesion edema or exudate  NECK: no obvious masses on inspection palpation supple LUNGS:  No retractions question rhonchi decreased  breath sounds right base. They've cleared with coughing CV: HRRR, no clubbing cyanosis or  peripheral edema nl cap refill  MS: moves all extremities without noticeable focal  abnormality left leg bothers her able to get up on the table low skin hyperpigmentation the lower extremities with senile ecchymosis. No petechiae PSYCH: pleasant and cooperative, has obvious memory problem but able to converse. Discussed plan also with sister  ASSESSMENT AND PLAN:  Discussed the following assessment and plan:  Acute respiratory infection - Plan: DG Chest 2 View  Fever presenting with conditions classified elsewhere - Plan: DG Chest 2 View  Cognitive deficits  Age factor Getting x-ray today concerned about early pneumonia if x-ray clear however will do close observation symptomatic treatment recheck for alarm findings. Age factor but no history of serious chronic lung disease. Her cognitive changes appear to be continuing might be slightly worse. No acute neuro findings. Tricia Mendez can sign up again for my chart to help with communication. Expectant management -Patient advised to return or notify health care team  if symptoms worsen ,persist or new concerns arise.  Patient Instructions  Get chest x ray  Today  To check right side of lung  .to make sure you dont have pneumonia. And need antibiotic treatment  If x ray is good then  Rest fluids  Tylenol  for fever  .  Contact us if fever not gone in 2 days or worse . Cough can last  2 weeks but should feel better next week.     Tricia Mendez M.D.  .

## 2015-02-03 NOTE — Telephone Encounter (Signed)
Patient Name: Tricia SladeBETTY Myren  DOB: June 24, 1924    Initial Comment Caller states mother has a cough and cold, and elevation about 1 degree. would like something called in.    Nurse Assessment  Nurse: Scarlette ArStandifer, RN, Heather Date/Time (Eastern Time): 02/03/2015 9:37:06 AM  Confirm and document reason for call. If symptomatic, describe symptoms. ---Caller states mother has a cough and cold that started last night, and temperature elevation about 1 degree. would like something called in. Caller is not with her mother right now, but she would like something called in b/c she does not want her brother in law to get sick , he is on hospice right now.  Has the patient traveled out of the country within the last 30 days? ---Not Applicable  Does the patient require triage? ---No  Please document clinical information provided and list any resource used. ---Caller would like something called in to her mother's pharmacy, since they will deliver medication.     Guidelines    Guideline Title Affirmed Question Affirmed Notes       Final Disposition User        Comments  Please call caller back and let her know if something can be called in.

## 2015-02-03 NOTE — Patient Instructions (Addendum)
Get chest x ray  Today  To check right side of lung  .to make sure you dont have pneumonia. And need antibiotic treatment  If x ray is good then  Rest fluids  Tylenol  for fever  .  Contact us if fever not gone in 2 days or worse . Cough can last  2 weeks but should feel better next week.

## 2015-02-10 ENCOUNTER — Other Ambulatory Visit: Payer: Self-pay | Admitting: *Deleted

## 2015-02-10 MED ORDER — GUAIFENESIN-CODEINE 100-10 MG/5ML PO SOLN: 5.0000 mL | ORAL | Status: DC | PRN

## 2015-02-10 MED ORDER — BENZONATATE 100 MG PO CAPS: 100.0000 mg | ORAL_CAPSULE | Freq: Three times a day (TID) | ORAL | Status: DC | PRN

## 2015-02-10 NOTE — Telephone Encounter (Signed)
At night robitussin with codiene  Disp 6 ox can use 1 tsp every 4-6 hours at night for cough   In day can try tessalone perles   Max tid  Put order in please send or call in . thanks

## 2015-02-10 NOTE — Telephone Encounter (Signed)
  PLEASE NOTE: All timestamps contained within this report are represented as [place] Standard Time. CONFIDENTIALTY NOTICE: This fax transmission is intended only for the addressee. It contains information that is legally privileged, confidential or otherwise protected from use or disclosure. If you are not the intended recipient, you are strictly prohibited from reviewing, disclosing, copying using or disseminating any of this information or taking any action in reliance on or regarding this information. If you have received this fax in error, please notify us immediately by telephone so that we can arrange for its return to [place]. Phone: [phone], Toll-Free: [phone], Fax: [phone] Page: 1 of 1 Call Id: [id number] [facility] - Client TELEPHONE ADVICE RECORD [facility] Call [facility] Patient Name: Tricia Mendez Gender: Female DOB: August 17, 1924 Age: 11 Y 9 M 24 D Return Phone Number: [phone] (Primary) Address: City/State/Zip: [place] Client [facility] - Client Client Site  [facility] Day Physician Berniece Andreas Contact Type Call Call Type Triage / Clinical Caller Name [name] Relationship To Patient Daughter Appointment Disposition EMR Appointment Not Necessary Info pasted into Epic Yes Return Phone Number [phone] (Primary) Chief Complaint Cough Initial Comment caller states her mother has had some bad coughing spells - can she get an rx for her cough Nurse Assessment Nurse: Stefano Gaul, RN, Dwana Curd Date/Time (Eastern Time): 02/10/2015 11:06:00 AM Confirm and document reason for call. If symptomatic, describe symptoms. ---Caller states her sister called her and said her mother has had some coughing spells during the night. Dr. Fabian Sharp saw her last Friday and her CXR was negative. Dr. Fabian Sharp said she had a virus. She is wanting something called in for the cough. Her pharmacy will deliver. She  has a hard time getting to the doctor. She is not currently with the pt. Cough is keeping her up at night. Has the patient traveled out of the country within the last 30 days? ---No Does the patient require triage? ---No Please document clinical information provided and list any resource used. ---Advised caller as per client profile that note will sent to the office and someone should call her back sometime today and her sister can call if she wants to talk about the symptoms. Verbalized understanding. Guidelines Guideline Title Affirmed Question Affirmed Notes Nurse Date/Time (Eastern Time) Disp. Time Lamount Cohen Time) Disposition Final User 02/10/2015 11:18:11 AM Clinical Call Yes Stefano Gaul, RN, Dwana Curd After Care Instructions Given Call Event Type User Date / Time Description

## 2015-02-10 NOTE — Telephone Encounter (Signed)
Called and spoke to the pt.  Informed her that both medications have been sent to the pharmacy.  Instructed her to use the cough syrup at night and gels during the day.  Pt understands.  She will call back if not better.

## 2015-02-10 NOTE — Telephone Encounter (Signed)
Pt call into [facility] on [address]

## 2015-02-16 ENCOUNTER — Other Ambulatory Visit: Payer: Self-pay | Admitting: Internal Medicine

## 2015-02-16 NOTE — Telephone Encounter (Signed)
Sent to the pharmacy by e-scribe.  Pt has CPX scheduled for 06/09/15

## 2015-02-22 ENCOUNTER — Ambulatory Visit: Payer: Self-pay | Admitting: Internal Medicine

## 2015-02-25 ENCOUNTER — Encounter: Payer: Self-pay | Admitting: Internal Medicine

## 2015-02-25 ENCOUNTER — Ambulatory Visit (INDEPENDENT_AMBULATORY_CARE_PROVIDER_SITE_OTHER): Payer: Medicare Other | Admitting: Internal Medicine

## 2015-02-25 VITALS — BP 108/70 | HR 57 | Temp 97.9°F | Resp 16 | Ht 64.5 in | Wt 158.0 lb

## 2015-02-25 DIAGNOSIS — F03918 Unspecified dementia, unspecified severity, with other behavioral disturbance: Secondary | ICD-10-CM

## 2015-02-25 DIAGNOSIS — F0391 Unspecified dementia with behavioral disturbance: Secondary | ICD-10-CM

## 2015-02-25 DIAGNOSIS — F321 Major depressive disorder, single episode, moderate: Secondary | ICD-10-CM

## 2015-02-25 DIAGNOSIS — F0390 Unspecified dementia without behavioral disturbance: Secondary | ICD-10-CM | POA: Insufficient documentation

## 2015-02-25 DIAGNOSIS — G309 Alzheimer's disease, unspecified: Secondary | ICD-10-CM | POA: Diagnosis not present

## 2015-02-25 DIAGNOSIS — E876 Hypokalemia: Secondary | ICD-10-CM | POA: Diagnosis not present

## 2015-02-25 DIAGNOSIS — D692 Other nonthrombocytopenic purpura: Secondary | ICD-10-CM | POA: Diagnosis not present

## 2015-02-25 DIAGNOSIS — N3281 Overactive bladder: Secondary | ICD-10-CM | POA: Insufficient documentation

## 2015-02-25 DIAGNOSIS — I10 Essential (primary) hypertension: Secondary | ICD-10-CM

## 2015-02-25 DIAGNOSIS — I872 Venous insufficiency (chronic) (peripheral): Secondary | ICD-10-CM

## 2015-02-25 DIAGNOSIS — F0280 Dementia in other diseases classified elsewhere without behavioral disturbance: Secondary | ICD-10-CM | POA: Insufficient documentation

## 2015-02-25 DIAGNOSIS — R630 Anorexia: Secondary | ICD-10-CM | POA: Insufficient documentation

## 2015-02-25 DIAGNOSIS — M353 Polymyalgia rheumatica: Secondary | ICD-10-CM | POA: Diagnosis not present

## 2015-02-25 DIAGNOSIS — R42 Dizziness and giddiness: Secondary | ICD-10-CM | POA: Diagnosis not present

## 2015-02-25 MED ORDER — ATENOLOL 50 MG PO TABS
50.0000 mg | ORAL_TABLET | Freq: Every day | ORAL | Status: DC
Start: 2015-02-25 — End: 2015-06-12

## 2015-02-25 MED ORDER — MIRTAZAPINE 7.5 MG PO TABS: 7.5000 mg | ORAL_TABLET | Freq: Every day | ORAL | Status: DC

## 2015-02-25 NOTE — Progress Notes (Signed)
Patient ID: Tricia Mendez, female   DOB: October 25, 1923, 81 y.o.   MRN: [id number]   Location:  [facility]  Goals of Care: Advanced Directive information Does patient have an advance directive?: Yes, Type of Advance Directive: Healthcare Power of [place];Living will, Does patient want to make changes to advanced directive?: No - Patient declined   Chief Complaint  Patient presents with  . Establish Care    New patient establish care, read printed summary   . MMSE    20/30, failed clock drawing     HPI: Patient is a 81 y.o. white female seen in the office today to establish with me.    Has been at [employer] for 19 years.   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   Says she is lucky that she has 3 supportive children.  Says she attends the activities--singing.  Her daughter shakes her head that she does not.    Says she feels happy, but does not look like she means it.  Mostly sleeps well--goes at 11pm, gets up around 8am.    Says appetite is wonderful.  Used to be 192 lbs and now 158 lbs.  Says she's not going to keep losing.  Supper is her big meal.  Eats half a yogurt and some fruit with coffee for breakfast.  Lunch has lettuce with pimento cheese and [place] on it (one little piece) and glass of milk, then normal supper.  Has yogurt as dessert.  Once a week, has dessert.  Eats the same things over and over--says food all tastes the same.  Can't smell as well either.    Says her memory is not as well as she'd like.  Has worsened in past 2-3 mos.  Having word finding difficulty and trouble describing things ([employer] credit card and described it as a "box you have to give everybody").    Admits to frustration and sadness about her memory problems.  Gets mad and angry.  Not socializing much.  Says all of her friends have died--Susan says many of her friends have died.  The ladies that had children the same age have all passed.    Has had hypokalemia:  Is on kdur  .   Takes vitamin C--unsure why she takes.   Does not wear dentures--just has partial. Has been on the sertraline for over a year--only on 50mg  and her daughter feels it has been ineffective.  Sees Dr. Cordella Register for her bladder--has urge incontinence.  Does try to follow a schedule of every 2 hours to prevent incontinence episodes.  Takes myrbetriq.  Sometimes gets up to urinate.  Thinks it helps quite a bit.    Has lost weight since she's been on the bp med.  Her daughter wonders if she can come off the chlorthalidone.  After Dr. Jeanice Lim pulled a tooth, she was to take the allegra only for 2 wks.  Also had tylenol #3 for that pain.    Says she can hear pretty well, but I've had to repeat almost everything I've asked her today.  PMR:  4-5 years ago, had diagnosis.  Sees Dr. Dierdre Forth for that.  Prednisone has been slowly tapered down.  Review of Systems:  Review of Systems  Constitutional: Positive for weight loss. Negative for fever and chills.  HENT: Negative for congestion and hearing loss.   Eyes: Negative for blurred vision.       Glasses  Respiratory: Negative for shortness of breath.  Cardiovascular: Negative for chest pain, palpitations and leg swelling.  Gastrointestinal: Negative for abdominal pain, constipation, blood in stool and melena.  Genitourinary: Positive for urgency. Negative for dysuria and frequency.  Musculoskeletal: Negative for myalgias, joint pain, falls and neck pain.  Skin: Negative for rash.  Neurological: Negative for dizziness and headaches.  Psychiatric/Behavioral: Positive for depression and memory loss. Negative for hallucinations. The patient is not nervous/anxious and does not have insomnia.     Past Medical History  Diagnosis Date  . Cellulitis of left leg   . Eczema   . Hypertension   . Mild cognitive impairment   . Osteoarthritis   . Polymyalgia rheumatica     rx with prednisone  . Varicose veins   . Xerosis of skin   . Urinary  incontinence   . History of renal stone     excision    Past Surgical History  Procedure Laterality Date  . Joint replacement    . Kidney stone surgey    . Cataract surgery    . Lipoma removal      No Known Allergies Medications: Patient's Medications  New Prescriptions   ATENOLOL (TENORMIN) 50 MG TABLET    Take 1 tablet (50 mg total) by mouth daily.   MIRTAZAPINE (REMERON) 7.5 MG TABLET    Take 1 tablet (7.5 mg total) by mouth at bedtime.  Previous Medications   CALCIUM CARB-CHOLECALCIFEROL (CALCIUM 600 + D PO)    Take by mouth daily.   MIRABEGRON ER (MYRBETRIQ) 50 MG TB24 TABLET    Take 50 mg by mouth daily.   MULTIPLE VITAMIN (MULTIVITAMIN WITH MINERALS) TABS    Take 1 tablet by mouth every morning.   POTASSIUM CHLORIDE (K-DUR) 10 MEQ TABLET    TAKE TWO TABLETS BY MOUTH EVERY DAY   PREDNISONE (DELTASONE) 1 MG TABLET    Take 2 mg by mouth daily with breakfast.   SERTRALINE (ZOLOFT) 50 MG TABLET    TAKE 1 TABLET BY MOUTH EVERY DAY  Modified Medications   No medications on file  Discontinued Medications   ACETAMINOPHEN (TYLENOL) 325 MG TABLET    Take for pain as needed   ACETAMINOPHEN-CODEINE (TYLENOL #3) 300-30 MG PER TABLET    Take 1 tablet by mouth every 6 (six) hours as needed for moderate pain.   ALREX 0.2 % SUSP    Place 1 drop into both eyes daily.    ASCORBIC ACID (VITAMIN C) 100 MG TABLET    Take 100 mg by mouth every morning.    ATENOLOL-CHLORTHALIDONE (TENORETIC) 50-25 MG PER TABLET    TAKE 1 TABLET BY MOUTH EVERY DAY   BENZONATATE (TESSALON) 100 MG CAPSULE    Take 1 capsule (100 mg total) by mouth 3 (three) times daily as needed for cough.   CALCIUM CARBONATE (OS-CAL - DOSED IN MG OF ELEMENTAL CALCIUM) 1250 MG TABLET    Take 1 tablet by mouth every morning.   CHOLECALCIFEROL (VITAMIN D) 1000 UNITS TABLET    Take 1,000 Units by mouth every morning.    DONEPEZIL (ARICEPT) 10 MG TABLET    TAKE ONE TABLET BY MOUTH NIGHTLY   FEXOFENADINE (ALLEGRA) 180 MG TABLET    Take  180 mg by mouth daily.   GUAIFENESIN-CODEINE 100-10 MG/5ML SYRUP    Take 5-10 mLs by mouth every 4 (four) hours as needed for cough. At night   PREDNISONE (DELTASONE) 1 MG TABLET    Take 2 mg by mouth every morning.    VITAMIN C (  ASCORBIC ACID) 500 MG TABLET    Take 500 mg by mouth daily.    Physical Exam: Filed Vitals:   02/25/15 0853  BP: 108/70  Pulse: 57  Temp: 97.9 F (36.6 C)  TempSrc: Oral  Resp: 16  Height: 5' 4.5" (1.638 m)  Weight: 158 lb (71.668 kg)  SpO2: 95%   Physical Exam  Constitutional: She appears well-developed and well-nourished. No distress.  HENT:  Head: Normocephalic and atraumatic.  Right Ear: External ear normal.  Left Ear: External ear normal.  Nose: Nose normal.  Mouth/Throat: Oropharynx is clear and moist. No oropharyngeal exudate.  Eyes: Conjunctivae and EOM are normal. Pupils are equal, round, and reactive to light.  glasses  Neck: Normal range of motion. Neck supple. No JVD present. No tracheal deviation present. No thyromegaly present.  Cardiovascular: Normal rate, regular rhythm, normal heart sounds and intact distal pulses.   Pulmonary/Chest: Effort normal and breath sounds normal. No respiratory distress.  Abdominal: Soft. Bowel sounds are normal. She exhibits no distension. There is no tenderness.  Musculoskeletal: Normal range of motion. She exhibits no edema or tenderness.  Neurological: She is alert. She has normal reflexes.  Oriented to person and place, not precise time  Skin: Skin is warm and dry.  Psychiatric: She has a normal mood and affect. Her behavior is normal.  Repeats herself, also says what for everything said, but does not have a hearing problem, has obvious word-finding problems    Labs reviewed: Basic Metabolic Panel:  Recent Labs  78/29/56 1000 08/25/14 1040 11/16/14 1309  NA 138 141 139  K 3.4* 3.9 3.0*  CL 100 102 99  CO2 31 33* 30  GLUCOSE 89 95 114*  BUN CREATININE 0.6 0.7 0.65  CALCIUM 10.0  10.6* 10.8*  MG  --  1.5  --   CBC:  Recent Labs  11/16/14 1309  WBC 10.9*  NEUTROABS 8.5*  HGB 15.3*  HCT 44.3  MCV 85.4  PLT 231   Assessment/Plan 1. Major depressive disorder, single episode, moderate - seems this is due to her dementia and aphasia problem -will treat this first to see if it improves her cognition any (has a Passenger transport manager type perspective about some things at this point) - mirtazapine (REMERON) 7.5 MG tablet; Take 1 tablet (7.5 mg total) by mouth at bedtime.  Dispense: 30 tablet; Refill: 3  2. Dementia, senile, with behavioral disturbance -seems mild to moderate based on great functional status and 20/30 on MMSE -aricept was stopped previously which her daughter believes was due to being ineffective  3. Alzheimer's disease -suspect this is the cause of #2 vs. Primary progressive aphasia--her daughter does note that she had difficulty finding words as her presenting symptom - consider CT brain next visit and neuropsychological testing  4. OAB (overactive bladder) -cont myrbetriq for this which has decreased her frequency and urgency somewhat  5. Essential hypertension, benign - has been on atenolol with chlorthalidone but has had lightheadedness on standing and hypokalemia and low bp here today -will stop the chlorthalidone part and cont the atenolol -advised for her to see the nurse at [facility] to have her bp checked three times per week after this change to make sure she does not become hypertensive -she has has lost a lot of weight and may not need as much bp medication - atenolol (TENORMIN) 50 MG tablet; Take 1 tablet (50 mg total) by mouth daily.  Dispense: 30 tablet; Refill: 3 - Basic  metabolic panel; Future  6. Poor appetite -is eating very little at breakfast and lunch--encouraged her to eat more, but she didn't seem like she would change this b/c she is happy with her weight loss -hopefully remeron will improve her appetite  7. Senile  purpura -noted today, she has had longstanding easy bruising for several years now, she is on prednisone which is not helping  8. Chronic venous insufficiency -of bilateral LE--explained this process to her  -recommended elevating her feet at rest  9. Postural lightheadedness -suspect her bp is too well controlled and she might be orthostatic at times--d/c chlorthalidone  10. Polymyalgia rheumatica -cont prednisone 2mg  daily, f/u with Dr. Dierdre Forth  11. Hypokalemia - cont kcl daily - Basic metabolic panel; Future to see if she still needs potassium after chlorthalidone is stopped  Labs/tests ordered:  bmp  Next appt:  1 month with bmp before  Addis Bennie L. Anitta Tenny, D.O. Geriatrics [facility] [address], [place] [address] Cell Phone (Mon-Fri 8am-5pm):  [phone] On Call:  [phone] & follow prompts after 5pm & weekends Office Phone:  [phone] Office Fax:  [phone]

## 2015-02-25 NOTE — Progress Notes (Signed)
Failed clock drawing  

## 2015-02-25 NOTE — Patient Instructions (Addendum)
Stop vitamin C.   Stop atenolol/chlorthalidone Begin plain atenolol  daily for blood pressure Please go to see the nurse three times weekly for blood pressure checks at [facility] remeron at bedtime for your mood.  We can increase this if it is helping some. We might start namenda at your next visit for your memory.

## 2015-03-09 ENCOUNTER — Ambulatory Visit (INDEPENDENT_AMBULATORY_CARE_PROVIDER_SITE_OTHER): Payer: Medicare Other | Admitting: Family Medicine

## 2015-03-09 ENCOUNTER — Telehealth: Payer: Self-pay | Admitting: Internal Medicine

## 2015-03-09 VITALS — BP 112/70 | HR 67 | Temp 98.7°F | Resp 18 | Ht 64.0 in | Wt 159.6 lb

## 2015-03-09 DIAGNOSIS — S8012XA Contusion of left lower leg, initial encounter: Secondary | ICD-10-CM | POA: Diagnosis not present
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
## 2015-03-09 NOTE — Telephone Encounter (Signed)
Pt is no longer a pt of Dr. Fabian SharpPanosh.  She has transferred her care to Dr. Renato Gailseed (not at this office).  Pt has Alzheimer's.  Tried calling Darl PikesSusan (daughter) to see if the pt is confused and calling the wrong office or do [facility].  Left a message for Darl PikesSusan to return my call.

## 2015-03-09 NOTE — Patient Instructions (Signed)
Try and protect the area so you do not bump it and tear it open.  If it pops open come in sooner, otherwise plan to let me recheck it on Saturday.
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
## 2015-03-10 NOTE — Telephone Encounter (Signed)
Pt seen by Dr. Alwyn RenHopper on 03/09/15

## 2015-03-12 ENCOUNTER — Other Ambulatory Visit: Payer: Self-pay | Admitting: Internal Medicine

## 2015-03-12 ENCOUNTER — Ambulatory Visit (INDEPENDENT_AMBULATORY_CARE_PROVIDER_SITE_OTHER): Payer: Medicare Other | Admitting: Family Medicine

## 2015-03-12 VITALS — BP 136/64 | HR 60 | Temp 97.9°F | Resp 18

## 2015-03-12 DIAGNOSIS — S8012XD Contusion of left lower leg, subsequent encounter: Secondary | ICD-10-CM

## 2015-03-12 NOTE — Patient Instructions (Signed)
Keep wound dressed and returned Sunday. Porfirio Oarhelle Jeffery PA-C will recheck it.    Return sooner if any problems such as redness increasing  This type of a wound may take a long time to get well. If we do not feel like it is continuing to get cleaned up well we may need to send you to the wound care center.

## 2015-03-12 NOTE — Progress Notes (Signed)
  Subjective:  Patient ID: Tricia Mendez, female    DOB: 03-Oct-1923  Age: 81 y.o. MRN: [id number]  Patient was concerned about the hematoma on her ankle and came in for a recheck. She was supposed come in a few days. It is not really hurting her. It did is a tiny bit of old blood.   Objective:   Large hematoma lateral left ankle above the lateral malleolar area. The surrounding tissues are less inflamed and they were the other day. She has a large ecchymosis on the lateral side of the left leg from just below the knee on down to the ankles.. The hematoma itself is a little bit tender on palpation. The surface is starting to get a little necrotic.  Procedure note: The surface skin of the hematoma, about 4 x 6 cm in diameter, whereas debrided. A large clot was removed in pieces with some difficulty. It was adherent to the subcutaneous tissues. This was washed out the best I could. There is still some clot material adherent. The edges were debrided back as far as safely possible approaching viable tissue. Wound was dressed with wet gauze for wet to dry dressing. Gypsy Balsamheryl Jeffrrey She will recheck for me.s, PA-C, came in to look at it.  Assessment & Plan:   Assessment:  Hematoma of skin left ankle secondary to wound  Plan: Patient Instructions  Keep wound dressed and returned Sunday. Porfirio Oarhelle Jeffery PA-C will recheck it.    Return sooner if any problems such as redness increasing  This type of a wound may take a long time to get well. If we do not feel like it is continuing to get cleaned up well we may need to send you to the wound care center.    HOPPER,DAVID, MD 03/12/2015

## 2015-03-14 ENCOUNTER — Ambulatory Visit (INDEPENDENT_AMBULATORY_CARE_PROVIDER_SITE_OTHER): Payer: Medicare Other | Admitting: Physician Assistant

## 2015-03-14 VITALS — BP 122/66 | HR 80 | Temp 98.3°F | Resp 16

## 2015-03-14 DIAGNOSIS — S8012XD Contusion of left lower leg, subsequent encounter: Secondary | ICD-10-CM

## 2015-03-14 NOTE — Patient Instructions (Signed)
The dressing needs to be changes 1-2 times each day. Remove the existing dressing. Wash gently with soap and water. Apply a thin layer of antibiotic ointment (Mupirocin or Polysporin) and cover with a non-adherent gauze.

## 2015-03-14 NOTE — Progress Notes (Signed)
Chief Complaint  Patient presents with  . Follow-up    Hematoma of leg    History of Present Illness: Patient presents for re-evaluation of a wound of the LEFT lateral lower leg after she had a large hematoma unroofed on 03/12/2015. A wet-to-dry dressing was applied. Dressing has remained intact. She is accompanied by her daughter.  No increased pain, swelling or redness. No really any pain.    No Known Allergies  Prior to Admission medications   Medication Sig Start Date End Date Taking? Authorizing Provider  atenolol (TENORMIN) 50 MG tablet Take 1 tablet (50 mg total) by mouth daily. 02/25/15  Yes Tiffany L Reed, DO  Calcium Carb-Cholecalciferol (CALCIUM 600 + D PO) Take by mouth daily.   Yes Historical Provider, MD  donepezil (ARICEPT) 10 MG tablet TAKE ONE TABLET BY MOUTH NIGHTLY 03/12/15  Yes Tiffany L Reed, DO  mirabegron ER (MYRBETRIQ) 50 MG TB24 tablet Take 50 mg by mouth daily.   Yes Crist Fat, MD  mirtazapine (REMERON) 7.5 MG tablet Take 1 tablet (7.5 mg total) by mouth at bedtime. 02/25/15  Yes Tiffany L Reed, DO  Multiple Vitamin (MULTIVITAMIN WITH MINERALS) TABS Take 1 tablet by mouth every morning.   Yes Historical Provider, MD  potassium chloride (K-DUR) 10 MEQ tablet TAKE TWO TABLETS BY MOUTH EVERY DAY 12/28/14  Yes Madelin Headings, MD  predniSONE (DELTASONE) 1 MG tablet Take 2 mg by mouth daily with breakfast.   Yes Historical Provider, MD  sertraline (ZOLOFT) 50 MG tablet TAKE 1 TABLET BY MOUTH EVERY DAY 02/16/15  Yes Madelin Headings, MD    Patient Active Problem List   Diagnosis Date Noted  . Major depressive disorder, single episode, moderate 02/25/2015  . Dementia, senile 02/25/2015  . Alzheimer's disease 02/25/2015  . OAB (overactive bladder) 02/25/2015  . Essential hypertension, benign 02/25/2015  . Poor appetite 02/25/2015  . Senile purpura 02/25/2015  . Chronic venous insufficiency 02/25/2015  . Age factor 02/03/2015  . Visit for preventive health  examination 01/21/2014  . Medication management 01/21/2014  . Acute respiratory infection 10/08/2012  . Urinary incontinence 10/08/2012  . Urinary tract infection, site not specified 10/08/2012  . Incontinence 08/07/2012  . Rectal bleeding hx 08/07/2012  . Medicare annual wellness visit, subsequent 08/07/2012  . Lower back pain 12/11/2011  . Urinary frequency 12/11/2011  . Medication side effect 12/11/2011  . UTI (lower urinary tract infection) 10/08/2011  . Dysuria 10/08/2011  . Dizziness 07/10/2011  . Cognitive deficits 05/01/2011  . Venous stasis dermatitis 03/13/2011  . Foot pain, right 02/22/2011  . Prolonged depressive reaction 02/22/2011  . Shingles 10/13/2010  . MILD COGNITIVE IMPAIRMENT SO STATED 05/10/2010  . UNSPEC LOCAL INFECTION SKIN&SUBCUTANEOUS TISSUE 05/10/2010  . DERMATITIS 05/10/2010  . 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 SKIN LESION 10/22/2009  . VARICOSE VEINS, LOWER EXTREMITIES 02/14/2008  . Polymyalgia rheumatica 06/25/2007  . ABNORMAL FINDINGS, ELEVATED BP W/O HTN 06/25/2007  . WEAKNESS 03/27/2007  . HYPERTENSION 03/07/2007  . OSTEOARTHRITIS 03/07/2007  . URINARY INCONTINENCE 03/07/2007     Physical Exam  Constitutional: She appears well-developed. She is active and cooperative. No distress.  BP 122/66 mmHg  Pulse 80  Temp(Src) 98.3 F (36.8 C) (Oral)  Resp 16  SpO2 98%   Skin: Skin is warm and dry.     Psychiatric: She has a normal mood and affect. Her speech is normal and behavior is normal. Cognition and memory are impaired (consisted with dementia).  ASSESSMENT & PLAN:  1. Hematoma of leg, left, subsequent encounter Improving. Mupirocin ointment and dressing applied. Local wound care with daily dressing changes, which [facility] where she is a resident. Another daughter will bring her in for re-evaluation on Friday 03/19/2015.   Fernande Brashelle S. Kaimana Lurz,  PA-C Physician Assistant-Certified [facility]

## 2015-03-19 ENCOUNTER — Ambulatory Visit (INDEPENDENT_AMBULATORY_CARE_PROVIDER_SITE_OTHER): Payer: Medicare Other | Admitting: Urgent Care

## 2015-03-19 VITALS — BP 104/62 | HR 64 | Temp 97.8°F | Resp 18

## 2015-03-19 DIAGNOSIS — S8012XD Contusion of left lower leg, subsequent encounter: Secondary | ICD-10-CM | POA: Diagnosis not present

## 2015-03-19 DIAGNOSIS — S8010XA Contusion of unspecified lower leg, initial encounter: Secondary | ICD-10-CM | POA: Insufficient documentation

## 2015-03-19 NOTE — Patient Instructions (Signed)
- Please change dressings daily, apply mupirocin antibiotic cream over wound. - Patient may shower, however, dressing must be changed right after her shower. - She is to return to our clinic for re-evaluation in 1 week.   Hematoma A hematoma is a collection of blood under the skin, in an organ, in a body space, in a joint space, or in other tissue. The blood can clot to form a lump that you can see and feel. The lump is often firm and may sometimes become sore and tender. Most hematomas get better in a few days to weeks. However, some hematomas may be serious and require medical care. Hematomas can range in size from very small to very large. CAUSES  A hematoma can be caused by a blunt or penetrating injury. It can also be caused by spontaneous leakage from a blood vessel under the skin. Spontaneous leakage from a blood vessel is more likely to occur in older people, especially those taking blood thinners. Sometimes, a hematoma can develop after certain medical procedures. SIGNS AND SYMPTOMS   A firm lump on the body.  Possible pain and tenderness in the area.  Bruising.Blue, dark blue, purple-red, or yellowish skin may appear at the site of the hematoma if the hematoma is close to the surface of the skin. For hematomas in deeper tissues or body spaces, the signs and symptoms may be subtle. For example, an intra-abdominal hematoma may cause abdominal pain, weakness, fainting, and shortness of breath. An intracranial hematoma may cause a headache or symptoms such as weakness, trouble speaking, or a change in consciousness. DIAGNOSIS  A hematoma can usually be diagnosed based on your medical history and a physical exam. Imaging tests may be needed if your health care provider suspects a hematoma in deeper tissues or body spaces, such as the abdomen, head, or chest. These tests may include ultrasonography or a CT scan.  TREATMENT  Hematomas usually go away on their own over time. Rarely does the  blood need to be drained out of the body. Large hematomas or those that may affect vital organs will sometimes need surgical drainage or monitoring. HOME CARE INSTRUCTIONS   Apply ice to the injured area:   Put ice in a plastic bag.   Place a towel between your skin and the bag.   Leave the ice on for 20 minutes, 2-3 times a day for the first 1 to 2 days.   After the first 2 days, switch to using warm compresses on the hematoma.   Elevate the injured area to help decrease pain and swelling. Wrapping the area with an elastic bandage may also be helpful. Compression helps to reduce swelling and promotes shrinking of the hematoma. Make sure the bandage is not wrapped too tight.   If your hematoma is on a lower extremity and is painful, crutches may be helpful for a couple days.   Only take over-the-counter or prescription medicines as directed by your health care provider. SEEK IMMEDIATE MEDICAL CARE IF:   You have increasing pain, or your pain is not controlled with medicine.   You have a fever.   You have worsening swelling or discoloration.   Your skin over the hematoma breaks or starts bleeding.   Your hematoma is in your chest or abdomen and you have weakness, shortness of breath, or a change in consciousness.  Your hematoma is on your scalp (caused by a fall or injury) and you have a worsening headache or a change in alertness  or consciousness. MAKE SURE YOU:   Understand these instructions.  Will watch your condition.  Will get help right away if you are not doing well or get worse. Document Released: 03/28/2004 Document Revised: 04/16/2013 Document Reviewed: 01/22/2013 [facility] Patient Information 2015 [place], [place]. This information is not intended to replace advice given to you by your health care provider. Make sure you discuss any questions you have with your health care provider.

## 2015-03-19 NOTE — Progress Notes (Signed)
    MRN: [id number] DOB: 06/28/1924  Subjective:   Tricia Mendez is a 81 y.o. female presenting for follow up on left leg hematoma. Patient has been seen here by PA-Chelle, she had her hematoma drained on 03/14/2015 and has had daily dressing changes with staff at [facility]. She denies fevers, redness, swelling, pain. Denies any other aggravating or relieving factors, no other questions or concerns.  Tricia Mendez has a current medication list which includes the following prescription(s): atenolol, calcium carb-cholecalciferol, donepezil, mirabegron er, mirtazapine, multivitamin with minerals, potassium chloride, prednisone, and sertraline. She has No Known Allergies.  Tricia Mendez  has a past medical history of Cellulitis of left leg; Eczema; Hypertension; Mild cognitive impairment; Osteoarthritis; Polymyalgia rheumatica; Varicose veins; Xerosis of skin; Urinary incontinence; History of renal stone; and Cataract. Also  has past surgical history that includes Joint replacement; kidney stone surgey; cataract surgery; lipoma removal; and Eye surgery.  ROS As in subjective.  Objective:   Vitals: BP 104/62 mmHg  Pulse 64  Temp(Src) 97.8 F (36.6 C)  Resp 18  SpO2 98%  Physical Exam  Constitutional: She is oriented to person, place, and time. She appears well-developed and well-nourished.  Cardiovascular: Normal rate.   Pulmonary/Chest: Effort normal.  Neurological: She is alert and oriented to person, place, and time.  Skin: Skin is warm and dry. No rash noted. No erythema. No pallor.       Assessment and Plan :   1. Hematoma of leg, left, subsequent encounter - Healing well, continue daily dressing changes, follow up in 1 week.  Wallis Bamberg, PA-C [facility] [phone] 03/19/2015 10:25 AM

## 2015-03-24 ENCOUNTER — Other Ambulatory Visit: Payer: Medicare Other

## 2015-03-24 DIAGNOSIS — I10 Essential (primary) hypertension: Secondary | ICD-10-CM

## 2015-03-24 DIAGNOSIS — E876 Hypokalemia: Secondary | ICD-10-CM

## 2015-03-25 ENCOUNTER — Ambulatory Visit (INDEPENDENT_AMBULATORY_CARE_PROVIDER_SITE_OTHER): Payer: Medicare Other | Admitting: Physician Assistant

## 2015-03-25 VITALS — BP 108/70 | HR 63 | Temp 98.1°F | Resp 16 | Ht 64.0 in

## 2015-03-25 DIAGNOSIS — S8012XD Contusion of left lower leg, subsequent encounter: Secondary | ICD-10-CM

## 2015-03-25 LAB — BASIC METABOLIC PANEL
BUN/Creatinine Ratio: 38 — ABNORMAL HIGH (ref 11–26)
BUN: 26 mg/dL (ref 8–27)
CO2: 27 mmol/L (ref 18–29)
Calcium: 9.7 mg/dL (ref 8.7–10.3)
Chloride: 102 mmol/L (ref 97–108)
Creatinine, Ser: 0.68 mg/dL (ref 0.57–1.00)
GFR calc Af Amer: 90 mL/min/{1.73_m2} (ref >59)
GFR calc non Af Amer: 78 mL/min/{1.73_m2} (ref >59)
Glucose: 84 mg/dL (ref 65–99)
Potassium: 3.7 mmol/L (ref 3.5–5.2)
Sodium: 143 mmol/L (ref 134–144)

## 2015-03-25 NOTE — Progress Notes (Signed)
[facility] [address] [place] [address] [phone]  Date:  03/25/2015   Name:  Tricia Mendez   DOB:  [birth year]   MRN:  [id number]  PCP:  Bufford Spikes, DO    Chief Complaint: Wound Check   History of Present Illness:  This is a 81 y.o. female with PMH dementia, HTN, venous insufficiency who is presenting for wound care of a hematoma on her left lower leg. Hematoma occurred 3 weeks ago after hitting the leg in a car. Chelle Jeffery drained the hematoma. She has been returning weekly for wound checks. She lives at [facility] and has been getting dressing changes daily. She denies significant pain, fever or chills. She is on daily prednisone for polymyalgia rheumatica. Thought that wound occurred from thin skin from prednisone.  Review of Systems:  Review of Systems See HPI  Patient Active Problem List   Diagnosis Date Noted  . Hematoma of leg 03/19/2015  . Major depressive disorder, single episode, moderate 02/25/2015  . Alzheimer's disease 02/25/2015  . OAB (overactive bladder) 02/25/2015  . Essential hypertension, benign 02/25/2015  . Poor appetite 02/25/2015  . Senile purpura 02/25/2015  . Chronic venous insufficiency 02/25/2015  . Age factor 02/03/2015  . Medication management 01/21/2014  . Acute respiratory infection 10/08/2012  . Urinary incontinence 10/08/2012  . Incontinence 08/07/2012  . Rectal bleeding hx 08/07/2012  . Lower back pain 12/11/2011  . Urinary frequency 12/11/2011  . Medication side effect 12/11/2011  . UTI (lower urinary tract infection) 10/08/2011  . Dysuria 10/08/2011  . Dizziness 07/10/2011  . Venous stasis dermatitis 03/13/2011  . Foot pain, right 02/22/2011  . Prolonged depressive reaction 02/22/2011  . Shingles 10/13/2010  . 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 VARICOSE VEINS, LOWER EXTREMITIES 02/14/2008  . Polymyalgia rheumatica 06/25/2007  . WEAKNESS 03/27/2007  .  OSTEOARTHRITIS 03/07/2007  . URINARY INCONTINENCE 03/07/2007    Prior to Admission medications   Medication Sig Start Date End Date Taking? Authorizing Provider  atenolol (TENORMIN) 50 MG tablet Take 1 tablet (50 mg total) by mouth daily. 02/25/15  Yes Tiffany L Reed, DO  Calcium Carb-Cholecalciferol (CALCIUM 600 + D PO) Take by mouth daily.   Yes Historical Provider, MD  donepezil (ARICEPT) 10 MG tablet TAKE ONE TABLET BY MOUTH NIGHTLY 03/12/15  Yes Tiffany L Reed, DO  mirabegron ER (MYRBETRIQ) 50 MG TB24 tablet Take 50 mg by mouth daily.   Yes Crist Fat, MD  mirtazapine (REMERON) 7.5 MG tablet Take 1 tablet (7.5 mg total) by mouth at bedtime. 02/25/15  Yes Tiffany L Reed, DO  Multiple Vitamin (MULTIVITAMIN WITH MINERALS) TABS Take 1 tablet by mouth every morning.   Yes Historical Provider, MD  potassium chloride (K-DUR) 10 MEQ tablet TAKE TWO TABLETS BY MOUTH EVERY DAY 12/28/14  Yes Madelin Headings, MD  predniSONE (DELTASONE) 1 MG tablet Take 2 mg by mouth daily with breakfast.   Yes Historical Provider, MD  sertraline (ZOLOFT) 50 MG tablet TAKE 1 TABLET BY MOUTH EVERY DAY 02/16/15  Yes Madelin Headings, MD    No Known Allergies  Past Surgical History  Procedure Laterality Date  . Joint replacement    . Kidney stone surgey    . Cataract surgery    . Lipoma removal    . Eye surgery      History  Substance Use Topics  . Smoking status: Former Smoker    Start date: 08/07/1944  Quit date: 10/13/1945  . Smokeless tobacco: Former Neurosurgeon    Quit date: 10/01/1945  . Alcohol Use: No    Family History  Problem Relation Age of Onset  . Melanoma Mother     42  . Breast cancer Mother     37  . Stroke Father     54  . Arthritis Father     9  . Osteoporosis Other     Medication list has been reviewed and updated.  Physical Examination:  Physical Exam  Constitutional: She is oriented to person, place, and time. She appears well-developed and well-nourished. No distress.    HENT:  Head: Normocephalic and atraumatic.  Right Ear: Hearing normal.  Left Ear: Hearing normal.  Nose: Nose normal.  Eyes: Conjunctivae and lids are normal. Right eye exhibits no discharge. Left eye exhibits no discharge. No scleral icterus.  Pulmonary/Chest: Effort normal. No respiratory distress.  Musculoskeletal: Normal range of motion.  Neurological: She is alert and oriented to person, place, and time.  Skin: Skin is warm, dry and intact. No lesion and no rash noted.  3X4 cm open wound of left lateral lower leg. Good granulation tissue present. No necrotic tissue. No signs of infection. Consulted with Gurney Maxin who saw wound 1 week ago and states it is improving and getting smaller. Wound cleaned with soap and water. Dressing with mupirocin placed.  Psychiatric: She has a normal mood and affect. Her speech is normal and behavior is normal. Thought content normal.    BP 108/70 mmHg  Pulse 63  Temp(Src) 98.1 F (36.7 C) (Oral)  Resp 16  Ht 5\' 4"  (1.626 m)  SpO2 98%     Assessment and Plan:  1. Hematoma of leg, left, subsequent encounter Wound improving. Good granulation tissue present. Dressing with mupirocin placed. [facility]. Return in 1 week for wound care.   [place] Min[place]ke Brackett, MHS [facility]  03/25/2015

## 2015-03-29 ENCOUNTER — Ambulatory Visit: Payer: Medicare Other | Admitting: Internal Medicine

## 2015-04-02 ENCOUNTER — Ambulatory Visit (INDEPENDENT_AMBULATORY_CARE_PROVIDER_SITE_OTHER): Payer: Medicare Other | Admitting: Urgent Care

## 2015-04-02 VITALS — HR 60 | Temp 97.3°F | Resp 18 | Ht 65.0 in | Wt 159.0 lb

## 2015-04-02 DIAGNOSIS — S8012XD Contusion of left lower leg, subsequent encounter: Secondary | ICD-10-CM | POA: Diagnosis not present

## 2015-04-02 NOTE — Patient Instructions (Addendum)
-   If possible please leave wound open to air during the day between 2-3 during a relatively inactive period of time for patient. Please make sure that her wound remains covered throughout the night. - Either way, please continue dressing changes daily.  Dressing Change A dressing is a material placed over wounds. It keeps the wound clean, dry, and protected from further injury. This provides an environment that favors wound healing.  BEFORE YOU BEGIN  Get your supplies together. Things you may need include:  Saline solution.  Flexible gauze dressing.  Medicated cream.  Tape.  Gloves.  Abdominal dressing pads.  Gauze squares.  Plastic bags.  Take pain medicine 30 minutes before the dressing change if you need it.  Take a shower before you do the first dressing change of the day. Use plastic wrap or a plastic bag to prevent the dressing from getting wet. REMOVING YOUR OLD DRESSING   Wash your hands with soap and water. Dry your hands with a clean towel.  Put on your gloves.  Remove any tape.  Carefully remove the old dressing. If the dressing sticks, you may dampen it with warm water to loosen it, or follow your caregiver's specific directions.  Remove any gauze or packing tape that is in your wound.  Take off your gloves.  Put the gloves, tape, gauze, or any packing tape into a plastic bag. CHANGING YOUR DRESSING  Open the supplies.  Take the cap off the saline solution.  Open the gauze package so that the gauze remains on the inside of the package.  Put on your gloves.  Clean your wound as told by your caregiver.  If you have been told to keep your wound dry, follow those instructions.  Your caregiver may tell you to do one or more of the following:  Pick up the gauze. Pour the saline solution over the gauze. Squeeze out the extra saline solution.  Put medicated cream or other medicine on your wound if you have been told to do so.  Put the solution  soaked gauze only in your wound, not on the skin around it.  Pack your wound loosely or as told by your caregiver.  Put dry gauze on your wound.  Put abdominal dressing pads over the dry gauze if your wet gauze soaks through.  Tape the abdominal dressing pads in place so they will not fall off. Do not wrap the tape completely around the affected part (arm, leg, abdomen).  Wrap the dressing pads with a flexible gauze dressing to secure it in place.  Take off your gloves. Put them in the plastic bag with the old dressing. Tie the bag shut and throw it away.  Keep the dressing clean and dry until your next dressing change.  Wash your hands. SEEK MEDICAL CARE IF:  Your skin around the wound looks red.  Your wound feels more tender or sore.  You see pus in the wound.  Your wound smells bad.  You have a fever.  Your skin around the wound has a rash that itches and burns.  You see black or yellow skin in your wound that was not there before.  You feel nauseous, throw up, and feel very tired. Document Released: 09/21/2004 Document Revised: 11/06/2011 Document Reviewed: 06/26/2011 [facility] Patient Information 2015 [place], [place]. This information is not intended to replace advice given to you by your health care provider. Make sure you discuss any questions you have with your health care provider.

## 2015-04-02 NOTE — Progress Notes (Signed)
    MRN: [id number] DOB: [birth year]  Subjective:   Tricia Mendez is a 81 y.o. female presenting for follow up on wound s/p drainage of hematoma over her lateral left leg. Reports significant improvement. Denies drainage of pus or bleeding, tenderness, swelling, redness, seepage. The patient has had daily dressing changes at the nursing staff usually at about 14:00. Denies any other aggravating or relieving factors, no other questions or concerns.  Tricia Mendez has a current medication list which includes the following prescription(s): atenolol, calcium carb-cholecalciferol, donepezil, mirabegron er, mirtazapine, multivitamin with minerals, potassium chloride, prednisone, and sertraline. She has No Known Allergies.  Tricia Mendez  has a past medical history of Cellulitis of left leg; Eczema; Hypertension; Mild cognitive impairment; Osteoarthritis; Polymyalgia rheumatica; Varicose veins; Xerosis of skin; Urinary incontinence; History of renal stone; and Cataract. Also  has past surgical history that includes Joint replacement; kidney stone surgey; cataract surgery; lipoma removal; and Eye surgery.  ROS As in subjective.  Objective:   Vitals: Pulse 60  Temp(Src) 97.3 F (36.3 C) (Oral)  Resp 18  Ht  (1.651 m)  Wt 159 lb (72.122 kg)  BMI 26.46 kg/m2  SpO2 98%  Physical Exam  Constitutional: She is oriented to person, place, and time. She appears well-developed and well-nourished.  Cardiovascular: Normal rate.   Pulmonary/Chest: Effort normal.  Musculoskeletal:       Legs: Neurological: She is alert and oriented to person, place, and time.  Skin: Skin is warm and dry. No rash noted. No erythema. No pallor.   WOUND CARE: left leg Dressing removed using sterile water to moisten dressing. Used 15cc of sterile water to rinse wound. Cleansed and dressed.  Assessment and Plan :   1. Hematoma of leg, left, subsequent encounter - Stable, healing well albeit slowly. Recommended patient continue  daily dressings. Provided instructions for her to leave wound open to air for about 2-3 hours a day, typically apply dressing throughout the night. Patient to return in one week for reevaluation.  Wallis Bamberg, PA-C [facility] [phone] 04/02/2015 5:29 PM

## 2015-04-09 ENCOUNTER — Ambulatory Visit (INDEPENDENT_AMBULATORY_CARE_PROVIDER_SITE_OTHER): Payer: Medicare Other | Admitting: Physician Assistant

## 2015-04-09 VITALS — BP 110/64 | HR 72 | Temp 98.2°F | Resp 14 | Ht 65.0 in | Wt 160.0 lb

## 2015-04-09 DIAGNOSIS — S81802D Unspecified open wound, left lower leg, subsequent encounter: Secondary | ICD-10-CM

## 2015-04-09 NOTE — Patient Instructions (Signed)
In the morning take the dressing off and use soap and water to gently scrub the area and let it dry and stay clean - put the silvadene on the wound only not on the surrounding intact skin because her skin is staying to wet.  Recheck on 8/19 with Dr Alwyn Ren.  Bring your fast pass with you.

## 2015-04-09 NOTE — Progress Notes (Signed)
Tricia Mendez  MRN: [id number] DOB: [birth year]  Subjective:  Pt presents to clinic for a wound recheck of a hematoma that she got 03/09/2015.  We have been checking on this regularly and she has daily drsg changes at [facility] where she lives in an independent living apartment.  She has been washing in the am and then leaving dry and then having the silvadene drsg placed in the afternoon.  She otherwise feels good and has minimal pain associated with the wound.  Patient Active Problem List   Diagnosis Date Noted  . Hematoma of leg 03/19/2015  . Major depressive disorder, single episode, moderate 02/25/2015  . Alzheimer's disease 02/25/2015  . OAB (overactive bladder) 02/25/2015  . Essential hypertension, benign 02/25/2015  . Poor appetite 02/25/2015  . Senile purpura 02/25/2015  . Chronic venous insufficiency 02/25/2015  . Age factor 02/03/2015  . Medication management 01/21/2014  . Acute respiratory infection 10/08/2012  . Urinary incontinence 10/08/2012  . Incontinence 08/07/2012  . Rectal bleeding hx 08/07/2012  . Lower back pain 12/11/2011  . Urinary frequency 12/11/2011  . Medication side effect 12/11/2011  . UTI (lower urinary tract infection) 10/08/2011  . Dysuria 10/08/2011  . Dizziness 07/10/2011  . Venous stasis dermatitis 03/13/2011  . Foot pain, right 02/22/2011  . Prolonged depressive reaction 02/22/2011  . Shingles 10/13/2010  . 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 VARICOSE VEINS, LOWER EXTREMITIES 02/14/2008  . Polymyalgia rheumatica 06/25/2007  . WEAKNESS 03/27/2007  . OSTEOARTHRITIS 03/07/2007  . URINARY INCONTINENCE 03/07/2007    Current Outpatient Prescriptions on File Prior to Visit  Medication Sig Dispense Refill  . atenolol (TENORMIN) 50 MG tablet Take 1 tablet (50 mg total) by mouth daily. 30 tablet 3  . Calcium Carb-Cholecalciferol (CALCIUM 600 + D PO) Take by mouth daily.    Marland Kitchen donepezil (ARICEPT) 10 MG tablet TAKE  ONE TABLET BY MOUTH NIGHTLY 90 tablet 0  . mirabegron ER (MYRBETRIQ) 50 MG TB24 tablet Take 50 mg by mouth daily.    . mirtazapine (REMERON) 7.5 MG tablet Take 1 tablet (7.5 mg total) by mouth at bedtime. 30 tablet 3  . Multiple Vitamin (MULTIVITAMIN WITH MINERALS) TABS Take 1 tablet by mouth every morning.    . potassium chloride (K-DUR) 10 MEQ tablet TAKE TWO TABLETS BY MOUTH EVERY DAY 60 tablet 0  . predniSONE (DELTASONE) 1 MG tablet Take 2 mg by mouth daily with breakfast.    . sertraline (ZOLOFT) 50 MG tablet TAKE 1 TABLET BY MOUTH EVERY DAY 30 tablet 3   No current facility-administered medications on file prior to visit.    No Known Allergies  Review of Systems  Constitutional: Negative for fever and chills.  Skin: Positive for wound.   Objective:  BP 110/64 mmHg  Pulse 72  Temp(Src) 98.2 F (36.8 C) (Oral)  Resp 14  Ht  (1.651 m)  Wt 160 lb (72.576 kg)  BMI 26.63 kg/m2  SpO2 97%  Physical Exam  Constitutional: She is oriented to person, place, and time and well-developed, well-nourished, and in no distress.  HENT:  Head: Normocephalic and atraumatic.  Right Ear: Hearing and external ear normal.  Left Ear: Hearing and external ear normal.  Eyes: Conjunctivae are normal.  Neck: Normal range of motion.  Pulmonary/Chest: Effort normal.  Neurological: She is alert and oriented to person, place, and time. Gait normal.  Skin: Skin is warm and dry.  3-4 cm wound/ulceration on her lateral left lower leg  with eschar of the wound - the eschar is removed with pick-ups and the area is cleaned and then silvadene was applied.  The skin surrounding skin is not red but rather macerated on the lateral edge.  There is good granulation tissue in the center of the wound.  Psychiatric: Mood, memory, affect and judgment normal.  Vitals reviewed.  Assessment and Plan :  Wound of left leg, subsequent encounter   Continue daily drsg changes but I want her to clean and scrub each  morning and then leave open to the air and then in the afternoon have the staff at [facility] reapply silvadene but only to the open part of the wound and not to the intact skin.  Benny Lennert PA-C  [facility] 04/09/2015 5:13 PM

## 2015-04-16 ENCOUNTER — Ambulatory Visit (INDEPENDENT_AMBULATORY_CARE_PROVIDER_SITE_OTHER): Payer: Medicare Other | Admitting: Family Medicine

## 2015-04-16 ENCOUNTER — Ambulatory Visit (INDEPENDENT_AMBULATORY_CARE_PROVIDER_SITE_OTHER): Payer: Medicare Other | Admitting: Internal Medicine

## 2015-04-16 ENCOUNTER — Encounter: Payer: Self-pay | Admitting: Internal Medicine

## 2015-04-16 VITALS — BP 124/64 | HR 49 | Temp 97.6°F | Resp 18 | Wt 176.0 lb

## 2015-04-16 VITALS — BP 122/72 | HR 57 | Temp 96.7°F | Resp 20 | Ht 65.0 in | Wt 158.6 lb

## 2015-04-16 DIAGNOSIS — Z23 Encounter for immunization: Secondary | ICD-10-CM

## 2015-04-16 DIAGNOSIS — I872 Venous insufficiency (chronic) (peripheral): Secondary | ICD-10-CM | POA: Diagnosis not present

## 2015-04-16 DIAGNOSIS — L97921 Non-pressure chronic ulcer of unspecified part of left lower leg limited to breakdown of skin: Secondary | ICD-10-CM | POA: Diagnosis not present

## 2015-04-16 DIAGNOSIS — I10 Essential (primary) hypertension: Secondary | ICD-10-CM | POA: Diagnosis not present

## 2015-04-16 DIAGNOSIS — G309 Alzheimer's disease, unspecified: Secondary | ICD-10-CM

## 2015-04-16 DIAGNOSIS — R630 Anorexia: Secondary | ICD-10-CM

## 2015-04-16 DIAGNOSIS — M353 Polymyalgia rheumatica: Secondary | ICD-10-CM | POA: Diagnosis not present

## 2015-04-16 DIAGNOSIS — N3281 Overactive bladder: Secondary | ICD-10-CM | POA: Diagnosis not present

## 2015-04-16 DIAGNOSIS — F321 Major depressive disorder, single episode, moderate: Secondary | ICD-10-CM | POA: Diagnosis not present

## 2015-04-16 DIAGNOSIS — F0280 Dementia in other diseases classified elsewhere without behavioral disturbance: Secondary | ICD-10-CM

## 2015-04-16 DIAGNOSIS — Z780 Asymptomatic menopausal state: Secondary | ICD-10-CM | POA: Diagnosis not present

## 2015-04-16 MED ORDER — MEMANTINE HCL ER 7 & 14 & 21 &28 MG PO CP24: ORAL_CAPSULE | ORAL | Status: DC

## 2015-04-16 NOTE — Progress Notes (Signed)
Patient ID: Tricia Mendez, female   DOB: [birth year], 81 y.o.   MRN: [id number]   Location:  [facility]  Code Status: DNR Goals of Care: Advanced Directive information Does patient have an advance directive?: Yes, Type of Advance Directive: Healthcare Power of [place];Living will, Does patient want to make changes to advanced directive?: No - Patient declined   Chief Complaint  Patient presents with  . Medical Management of Chronic Issues    03/01/15 leg wound healing well    HPI: Patient is a 81 y.o. white female seen in the office today for medical mgt of chronic diseases.  Fell 03/01/15.   Hit leg on car door.  She had a large hematoma of the left leg.  She's been going to urgent care for management of the wound--has been 7x now.  Circulation said to be poor so it's healing slowly.  Had prior studies (not in system). Has the dressing in place to be changed this afternoon at urgent care. Had been size of softball, now size of plum.  Also remains on prednisone therapy for her PMR.  She lives at [facility].  Says she is doing pretty well physically.  Mentally:  Can't remember things.  Mirtazapine does seem to be helping her general disposition.  She's been more positive.  She was upset when her daughter was 5 mins late to pick her up.   No changes in level of activity.  Went to a show last night "[employer]" play.  Continues on aricept also.  Discussed adding the namenda xr to her regimen--titration package.    Blood pressure well controlled.  Denies dizziness or lightheadedness.  No more falls since she hurt her left leg.    PMR:  Taking prednisone 2mg  daily.  Dr. Dierdre Forth left the prior practice.    Continues on myrbetriq 50mg .  Follows with Dr. Marlou Porch from urology.  Wears pad all of the time.  Sometimes at night gets to be too much.  Does go every 2 hours scheduled during the day to help, but still goes a lot at night.  Sometimes wakes her up and  sometimes does not.  Can sleep all night.  Discussed importance of 6 8oz glasses of water during the day.    In 2008, bone density was normal.  Needs reassessment.  On ca with D.    Needs pneumovax.    Review of Systems:  Review of Systems  Constitutional: Negative for fever and chills.  HENT: Positive for hearing loss. Negative for congestion.   Eyes: Negative for blurred vision.       Glasses  Respiratory: Negative for shortness of breath.   Cardiovascular: Negative for chest pain and leg swelling.       Chronic venous insufficiency  Gastrointestinal: Negative for abdominal pain, constipation, blood in stool and melena.  Genitourinary: Positive for urgency and frequency. Negative for dysuria and hematuria.  Musculoskeletal: Positive for falls.       With hematoma of left leg  Skin: Negative for rash.  Neurological: Negative for dizziness and loss of consciousness.  Endo/Heme/Allergies: Bruises/bleeds easily.  Psychiatric/Behavioral: Positive for depression and memory loss.    Past Medical History  Diagnosis Date  . Cellulitis of left leg   . Eczema   . Hypertension   . Mild cognitive impairment   . Osteoarthritis   . Polymyalgia rheumatica     rx with prednisone  . Varicose veins   . Xerosis of skin   .  Urinary incontinence   . History of renal stone     excision  . Cataract     Past Surgical History  Procedure Laterality Date  . Joint replacement    . Kidney stone surgey    . Cataract surgery    . Lipoma removal    . Eye surgery      No Known Allergies Medications: Patient's Medications  New Prescriptions   No medications on file  Previous Medications   ALREX 0.2 % SUSP    Instill 1 drop in both eyes twice daily as needed   ATENOLOL (TENORMIN) 50 MG TABLET    Take 1 tablet (50 mg total) by mouth daily.   CALCIUM CARB-CHOLECALCIFEROL (CALCIUM 600 + D PO)    Take by mouth daily.   DONEPEZIL (ARICEPT) 10 MG TABLET    TAKE ONE TABLET BY MOUTH NIGHTLY    MIRABEGRON ER (MYRBETRIQ) 50 MG TB24 TABLET    Take 50 mg by mouth daily.   MIRTAZAPINE (REMERON) 7.5 MG TABLET    Take 1 tablet (7.5 mg total) by mouth at bedtime.   MULTIPLE VITAMIN (MULTIVITAMIN WITH MINERALS) TABS    Take 1 tablet by mouth every morning.   POTASSIUM CHLORIDE (K-DUR) 10 MEQ TABLET    TAKE TWO TABLETS BY MOUTH EVERY DAY   PREDNISONE (DELTASONE) 1 MG TABLET    Take 2 mg by mouth daily with breakfast.   SERTRALINE (ZOLOFT) 50 MG TABLET    TAKE 1 TABLET BY MOUTH EVERY DAY  Modified Medications   No medications on file  Discontinued Medications   No medications on file    Physical Exam: Filed Vitals:   04/16/15 0932  BP: 122/72  Pulse: 57  Temp: 96.7 F (35.9 C)  TempSrc: Oral  Resp: 20  Height: 5\' 5"  (1.651 m)  Weight: 158 lb 9.6 oz (71.94 kg)  SpO2: 98%   Physical Exam  Constitutional: She appears well-developed and well-nourished. No distress.  HENT:  HOH  Eyes:  glasses  Cardiovascular: Normal rate, regular rhythm, normal heart sounds and intact distal pulses.   Pulmonary/Chest: Effort normal and breath sounds normal. No respiratory distress.  Abdominal: Soft. Bowel sounds are normal. She exhibits no distension. There is no tenderness.  Neurological: She is alert.  Oriented to person and place  Skin: Skin is warm and dry.  Bilateral ecchymoses and skin changes from chronic venous insufficiency; left shin hematoma is dressed and to be changed at urgent care today    Labs reviewed: Basic Metabolic Panel:  Recent Labs  16/10/96 1040 11/16/14 1309 03/24/15 0901  NA 141 139 143  K 3.9 3.0* 3.7  CL 102 99 102  CO2 33* 30 27  GLUCOSE 95 114* 84  BUN 23 21 26   CREATININE 0.7 0.65 0.68  CALCIUM 10.6* 10.8* 9.7  MG 1.5  --   --    Liver Function Tests: No results for input(s): AST, ALT, ALKPHOS, BILITOT, PROT, ALBUMIN in the last 8760 hours. No results for input(s): LIPASE, AMYLASE in the last 8760 hours. No results for input(s): AMMONIA in the  last 8760 hours. CBC:  Recent Labs  11/16/14 1309  WBC 10.9*  NEUTROABS 8.5*  HGB 15.3*  HCT 44.3  MCV 85.4  PLT 231    Assessment/Plan 1. Alzheimer's disease -cont aricept and add namenda XR titration pack -discussed that sometimes full dose 28mg  not required.   -remains functionally dependent aside from finances, pillbox filling by daughter, help with shopping and gets  main meal in [facility] dining room  2. Polymyalgia rheumatica -continues on  prednisone daily per Dr. Dierdre Forth -advised to f/u with him about this -prednisone not help left leg hematoma healing process  3. Major depressive disorder, single episode, moderate -cont zoloft and hs remeron low dose which has been helping per daughter -does not seem like it helped her appetite, but has helped her mood some  4. OAB (overactive bladder) -cont myrbetriq  as per urology and q 2 hr trips to restroom in the daytime  5. Essential hypertension, benign -bp at goal with current atenolol   6. Chronic venous insufficiency -ongoing and interfering with LLE hematoma/wound healing -elevate feet at rest and low sodium diet  7. Poor appetite -cont remeron, encouraged eggs sometimes at breakfast and full yogurt at breakfast to up protein intake some  8. Postmenopausal estrogen deficiency - last bone density normal in 2008 -will recheck in October after her daughter's surgery over - DG Bone Density; Future  9. Need for vaccination against Streptococcus pneumoniae -given pneumovax 23 today -previously had prevnar last year per records  Labs/tests ordered:   Orders Placed This Encounter  Procedures  . DG Bone Density    Last 2008 was normal.  Takes calcium with D and eats milk products.    Standing Status: Future     Number of Occurrences:      Standing Expiration Date: 06/15/2016    Order Specific Question:  Reason for Exam (SYMPTOM  OR DIAGNOSIS REQUIRED)    Answer:  postmenopausal estrogen deficiency    Order  Specific Question:  Preferred imaging location?    Answer:  [facility]    Next appt: 1 month f/u on memory  Mikya Don L. Zakir Henner, D.O. Geriatrics [facility] [address], [place] [address] Cell Phone (Mon-Fri 8am-5pm):  [phone] On Call:  [phone] & follow prompts after 5pm & weekends Office Phone:  [phone] Office Fax:  [phone]

## 2015-04-16 NOTE — Progress Notes (Signed)
  Subjective:  Patient ID: Tricia Mendez, female    DOB: 20-Sep-1923  Age: 81 y.o. MRN: [id number]  Patient reevaluated for the place on her left leg.   Objective:    The area is gradually healing in, now is about 3 x 5 cm. Good granulation tissue. A little bit of eschar forming which is still yellowish.  Assessment & Plan:   Assessment:  Leg ulceration, improving   Plan:  Local care. Clean with soap and water. Leave it open to the air. If it is wet she can put some Telfa on it.  There are no Patient Instructions on file for this visit.   HOPPER,DAVID, MD 04/16/2015

## 2015-04-16 NOTE — Patient Instructions (Addendum)
Increase your yogurt intake to a full one at breakfast to get more protein. Try to eat eggs once a week.  Try new memory pill:  Namenda XR daily.

## 2015-04-16 NOTE — Patient Instructions (Signed)
Use a dry Telfa dressing.  If it is still weeping apply some Polysporin ointment.  Keep it clean with some soapy water daily.  Return in about 6 days.

## 2015-05-05 ENCOUNTER — Other Ambulatory Visit: Payer: Self-pay | Admitting: Internal Medicine

## 2015-05-06 ENCOUNTER — Other Ambulatory Visit: Payer: Self-pay | Admitting: Family Medicine

## 2015-05-06 NOTE — Telephone Encounter (Signed)
Filled on 02/16/15 for four months.  Request is too early

## 2015-05-11 ENCOUNTER — Other Ambulatory Visit: Payer: Self-pay | Admitting: Internal Medicine

## 2015-05-11 ENCOUNTER — Telehealth: Payer: Self-pay | Admitting: *Deleted

## 2015-05-11 NOTE — Telephone Encounter (Signed)
She should be seen.  We would discuss alternative antidepressants that may not make her feel "funny" whatever that means.

## 2015-05-11 NOTE — Telephone Encounter (Signed)
Daughter states they have an appointment on 9/22 and will discuss it at that time

## 2015-05-11 NOTE — Telephone Encounter (Signed)
Patient daughter, Darl Pikes called and stated that patient has been feeling Anxious and agitated and would like something called in to help calm her. She stated that you did prescribe and antidepressant that she took at bedtime and the family saw it was helping her but patient stopped taking it because she stated that it made her feel funny. Please Advise.

## 2015-05-16 ENCOUNTER — Other Ambulatory Visit: Payer: Self-pay | Admitting: Internal Medicine

## 2015-05-20 ENCOUNTER — Encounter: Payer: Self-pay | Admitting: Internal Medicine

## 2015-05-20 ENCOUNTER — Ambulatory Visit (INDEPENDENT_AMBULATORY_CARE_PROVIDER_SITE_OTHER): Payer: Medicare Other | Admitting: Internal Medicine

## 2015-05-20 VITALS — BP 120/80 | HR 57 | Temp 98.2°F | Resp 20 | Ht 65.0 in | Wt 159.8 lb

## 2015-05-20 DIAGNOSIS — G309 Alzheimer's disease, unspecified: Secondary | ICD-10-CM | POA: Diagnosis not present

## 2015-05-20 DIAGNOSIS — M353 Polymyalgia rheumatica: Secondary | ICD-10-CM

## 2015-05-20 DIAGNOSIS — F321 Major depressive disorder, single episode, moderate: Secondary | ICD-10-CM | POA: Diagnosis not present

## 2015-05-20 DIAGNOSIS — F0280 Dementia in other diseases classified elsewhere without behavioral disturbance: Secondary | ICD-10-CM

## 2015-05-20 MED ORDER — SERTRALINE HCL 100 MG PO TABS: 100.0000 mg | ORAL_TABLET | Freq: Every day | ORAL | Status: DC

## 2015-05-20 MED ORDER — MEMANTINE HCL ER 7 MG PO CP24: 7.0000 mg | ORAL_CAPSULE | Freq: Every day | ORAL | Status: DC

## 2015-05-20 NOTE — Progress Notes (Signed)
Location:  [facility]  Code Status: DNR Goals of Care: Advanced Directive information     Chief Complaint  Patient presents with  . Medical Management of Chronic Issues    HPI: Patient is a 81 y.o. female seen in the office today for one month follow up of memory and anxiety.  Per Dr. Ernest Mallick note Namenda XR was added to her medication regimen on 8/19. However, patient was not able to tolerate it. The first few weeks, it made a difference that was noticeable to her daughters, but then the patient complained of it making her dizzy, so she stopped it.   Daughters report patient has been agitated a lot since last visit. Note restlessness and confusion. She will get confused about where something is, and then get very troubled that she can't find it.  Patient vaguely recalls these incidents. She is also not able to remember names, and is having problems with word retrieval.  Her daughter is preparing pill boxes for her and daughters report she takes them faithfully. There is a caregiver visiting a few times a week to help out and run errands.  Her appetite remains poor, however, her weight is stable.  Of note- she is to receive influenza vaccine at [facility].   She is still driving to places close to her home.    Review of Systems:  Review of Systems  Constitutional: Negative for weight loss and malaise/fatigue.  Eyes: Negative.   Respiratory: Negative for cough.   Cardiovascular: Negative for chest pain and palpitations.  Gastrointestinal: Negative for nausea, diarrhea and constipation.  Musculoskeletal: Negative for falls.  Neurological: Positive for dizziness. Negative for headaches.  Psychiatric/Behavioral: Positive for memory loss. The patient is nervous/anxious. The patient does not have insomnia.     Past Medical History  Diagnosis Date  . Cellulitis of left leg   . Eczema   . Hypertension   . Mild cognitive impairment     . Osteoarthritis   . Polymyalgia rheumatica     rx with prednisone  . Varicose veins   . Xerosis of skin   . Urinary incontinence   . History of renal stone     excision  . Cataract     Past Surgical History  Procedure Laterality Date  . Joint replacement    . Kidney stone surgey    . Cataract surgery    . Lipoma removal    . Eye surgery      No Known Allergies Medications: Patient's Medications  New Prescriptions   No medications on file  Previous Medications   ALREX 0.2 % SUSP    Instill 1 drop in both eyes twice daily as needed   ATENOLOL (TENORMIN) 50 MG TABLET    TAKE 1 TABLET BY MOUTH EVERY DAY   CALCIUM CARB-CHOLECALCIFEROL (CALCIUM 600 + D PO)    Take by mouth daily.   DONEPEZIL (ARICEPT) 10 MG TABLET    TAKE ONE TABLET BY MOUTH NIGHTLY   MEMANTINE HCL ER (NAMENDA XR TITRATION PACK) 7 & 14 & 21 &28 MG CP24    Take as directed on packet   MIRTAZAPINE (REMERON) 7.5 MG TABLET    TAKE 1 TABLET BY MOUTH NIGHTLY AT BEDTIME   MULTIPLE VITAMIN (MULTIVITAMIN WITH MINERALS) TABS    Take 1 tablet by mouth every morning.   MYRBETRIQ 50 MG TB24 TABLET    TAKE 1 TABLET BY MOUTH EVERY DAY   POTASSIUM CHLORIDE (K-DUR)  10 MEQ TABLET    TAKE TWO TABLETS BY MOUTH EVERY DAY   PREDNISONE (DELTASONE) 1 MG TABLET    Take 2 mg by mouth daily with breakfast.   SERTRALINE (ZOLOFT) 50 MG TABLET    TAKE 1 TABLET BY MOUTH EVERY DAY  Modified Medications   No medications on file  Discontinued Medications   No medications on file    Physical Exam: There were no vitals filed for this visit. Physical Exam  Constitutional: She appears well-developed and well-nourished. No distress.  Eyes:  glasses  Cardiovascular: Normal rate, regular rhythm and normal heart sounds.   Pulmonary/Chest: Effort normal and breath sounds normal. No respiratory distress.  Abdominal: Soft. Bowel sounds are normal. She exhibits no distension.  Neurological: She is alert.  Skin: Skin is warm and dry.   Psychiatric:  Difficulty recalling names of grandchildren Unable to follow conversation, "I don't know what you are talking about" Appears anxious at times, needs reassuring    Labs reviewed: Basic Metabolic Panel:  Recent Labs  16/10/96 1040 11/16/14 1309 03/24/15 0901  NA 141 139 143  K 3.9 3.0* 3.7  CL 102 99 102  CO2 33* 30 27  GLUCOSE 95 114* 84  BUN CREATININE 0.7 0.65 0.68  CALCIUM 10.6* 10.8* 9.7  MG 1.5  --   --    Liver Function Tests: No results for input(s): AST, ALT, ALKPHOS, BILITOT, PROT, ALBUMIN in the last 8760 hours. No results for input(s): LIPASE, AMYLASE in the last 8760 hours. No results for input(s): AMMONIA in the last 8760 hours. CBC:  Recent Labs  11/16/14 1309  WBC 10.9*  NEUTROABS 8.5*  HGB 15.3*  HCT 44.3  MCV 85.4  PLT 231   Lipid Panel: No results for input(s): CHOL, HDL, LDLCALC, TRIG, CHOLHDL, LDLDIRECT in the last 8760 hours. No results found for: HGBA1C  Procedures since last visit:  Assessment/Plan 1. Alzheimer's disease -Continues to decline. -Did not tolerate higher doses of Namenda XR due to dizziness -Will retry Namenda XR at lower dose   - memantine (NAMENDA XR) 7 MG CP24 24 hr capsule; Take 1 capsule (7 mg total) by mouth daily.  Dispense: 30 capsule; Refill: 3  2. Major depressive disorder, single episode, moderate -Notably with anxiety related to her awareness of forgetfulness and confusion -Will increase sertraline dose - sertraline (ZOLOFT) 100 MG tablet; Take 1 tablet (100 mg total) by mouth daily.  Dispense: 30 tablet; Refill: 3  3. Polymyalgia rheumatica -Increased risk for wounds due to prednisone therapy, which may also increase the agitation, however, unable to stop this   Labs/tests ordered: Next appt: 2 months follow up with Dr. Renato Gails for memory and anxiety  Ocie Bob, RN, BSN [employer]- Nurse Practitioner- Student Geriatrics [facility] [address], [place] [address] Office Phone:  [phone] Office Fax:  [phone]

## 2015-05-20 NOTE — Patient Instructions (Signed)
Please ask [facility] to send Tricia Mendez a record of her flu shot when it's given.

## 2015-06-04 ENCOUNTER — Other Ambulatory Visit: Payer: Medicare Other

## 2015-06-09 ENCOUNTER — Ambulatory Visit (INDEPENDENT_AMBULATORY_CARE_PROVIDER_SITE_OTHER): Payer: Medicare Other | Admitting: Internal Medicine

## 2015-06-09 ENCOUNTER — Encounter: Payer: Self-pay | Admitting: Internal Medicine

## 2015-06-09 VITALS — BP 122/64 | Temp 98.3°F | Ht 63.75 in | Wt 157.5 lb

## 2015-06-09 DIAGNOSIS — F321 Major depressive disorder, single episode, moderate: Secondary | ICD-10-CM

## 2015-06-09 DIAGNOSIS — F0390 Unspecified dementia without behavioral disturbance: Secondary | ICD-10-CM

## 2015-06-09 DIAGNOSIS — Z0001 Encounter for general adult medical examination with abnormal findings: Secondary | ICD-10-CM | POA: Diagnosis not present

## 2015-06-09 DIAGNOSIS — T887XXA Unspecified adverse effect of drug or medicament, initial encounter: Secondary | ICD-10-CM | POA: Diagnosis not present

## 2015-06-09 DIAGNOSIS — H9193 Unspecified hearing loss, bilateral: Secondary | ICD-10-CM | POA: Diagnosis not present

## 2015-06-09 DIAGNOSIS — I10 Essential (primary) hypertension: Secondary | ICD-10-CM

## 2015-06-09 DIAGNOSIS — T50905A Adverse effect of unspecified drugs, medicaments and biological substances, initial encounter: Secondary | ICD-10-CM

## 2015-06-09 DIAGNOSIS — IMO0001 Reserved for inherently not codable concepts without codable children: Secondary | ICD-10-CM

## 2015-06-09 DIAGNOSIS — M353 Polymyalgia rheumatica: Secondary | ICD-10-CM

## 2015-06-09 DIAGNOSIS — Z0000 Encounter for general adult medical examination without abnormal findings: Secondary | ICD-10-CM

## 2015-06-09 NOTE — Progress Notes (Signed)
Pre visit review using our clinic review tool, if applicable. No additional management support is needed unless otherwise documented below in the visit note.   Chief Complaint  Patient presents with  . Medicare Wellness    HPI: Tricia Mendez 81 y.o. comes in today for Preventive Medicare wellness visit .However she was seen [facility] and they are managing her medicationatoin zolft and given trials of  Dementia meds  Didn't tolerated namenda  Saw Tiffany reed  On sept 22 16  And also had seen  [facility] care for hematoma of leg in the summer    Had increased  The zoloft  And has helped some.    Had delusions from [facility] after taking for about 2 weeks so daughter had her stop it and her symptoms got better right away..   ? Took 2 weeks ...   Patient doesn't want to go back to [facility]  wants to go back to her "regular doctor" she is here today  Daughter presents a form from [facility] about driver's license. Asks [place] to fill it out. She continues to drive small amounts within a 5 mile radius to her daughter's house and occasionally downtown. She never drives in the dark she's never had an accident and she is never gotten lost.  Doesn't complain much of hearing but does repeat did have  Dr Dorma Russell   Test in past. Felt could be helped by hearing assistance but patient declined it at that time.  She has PMR on low-dose prednisone hasn't seen Dr. Raquel James in a while usually sees him once a year uncertain have should change the prednisone dose.   Health Maintenance  Topic Date Due  . INFLUENZA VACCINE  03/28/2016  . TETANUS/TDAP  04/17/2021  . DEXA SCAN  Completed  . ZOSTAVAX  Completed  . PNA vac Low Risk Adult  Completed   Health Maintenance Review LIFESTYLE:  TAD no Sugar beverages: No Sleep: Yes    MEDICARE DOCUMENT QUESTIONS  TO SCAN   Hearing:  Some  decease  Vision:  No limitations at present . Last eye check UTD  Safety:  Lives friends home monitored area Falls: None recent  Advance directive  :  Reviewed  Has one.  Memory: declining  Depression: No anhedonia unusual crying or depressive symptoms better since increasing the Zoloft  Nutrition: Eats well balanced diet; adequate calcium and vitamin D. No swallowing chewing problems.  Injury:   Had a shin wound that is healed the summer see notes  Other healthcare providers:  Reviewed today .  Social:   No social likes music goes to events.  Preventive parameters: up-to-date  Reviewed   ADLS:   Independent as far as physical needs needs reminders and cues helps with her medicine walks on a regular basis around her residence.    ROS:  GEN/ HEENT: No fever, significant weight changes sweats headaches  CV/ PULM; No chest pain shortness of breath cough, syncope,edema  change in exercise tolerance. GI /GU: No adominal pain, vomiting, change in bowel habits. No blood in the stool. No significant GU symptoms. SKIN/HEME: , Gets easily senile ecchymosi. No lymphadenopathy, nodules, masses.  NEURO/ PSYCH:  No weakness numbness.   See above memory knows names stated birth but stumbles when asked gets it right knows where she is at and have a good conversation but looks to her daughter for some details at times. IMM/ Allergy: No unusual infections.  Allergy .   REST of 12 system review negative except  as per HPI   Past Medical History  Diagnosis Date  . Cellulitis of left leg   . Eczema   . Hypertension   . Mild cognitive impairment   . Osteoarthritis   . Polymyalgia rheumatica (HCC)     rx with prednisone  . Varicose veins   . Xerosis of skin   . Urinary incontinence   . History of renal stone     excision  . Cataract     Family History  Problem Relation Age of Onset  . Melanoma Mother     51  . Breast cancer Mother     95  . Stroke Father     34  . Arthritis Father     23  . Osteoporosis Other     Social History   Social History  . Marital Status: Widowed    Spouse Name: N/A  . Number of Children: N/A   . Years of Education: N/A   Occupational History  . retired Runner, broadcasting/film/video    Social History Main Topics  . Smoking status: Former Smoker    Start date: 08/07/1944    Quit date: 10/13/1945  . Smokeless tobacco: Former Neurosurgeon    Quit date: 10/01/1945  . Alcohol Use: No  . Drug Use: No  . Sexual Activity: Not Asked   Other Topics Concern  . None   Social History Narrative   Widowed remote hx of tobacco x 4 years   Living at friends home.   G4P3   Daughters have HCPOA  If needed      Diet: Regular      Do you drink/ eat things with caffeine? coffee      Marital status: Married                              What year were you married ? Twice, 1st 1948, 2nd 1992      Do you live in a house, apartment,assistred living, condo, trailer, etc.)? Appartment      Is it one or more stories? No      How many persons live in your home ? 1      Do you have any pets in your home ?(please list) No      Current or past profession: Research officer, trade union       Do you exercise? Yes                             Type & how often: Walk daily      Do you have a living will? Yes      Do you have a DNR form? No                      If not, do you want to discuss one? Yes      Do you have signed POA?HPOA forms?  Yes               If so, please bring to your        appointment             Outpatient Encounter Prescriptions as of 06/09/2015  Medication Sig  . ALREX 0.2 % SUSP Instill 1 drop in both eyes twice daily as needed  . atenolol (TENORMIN) 50 MG tablet TAKE 1 TABLET BY MOUTH EVERY DAY  . Calcium Carb-Cholecalciferol (CALCIUM 600 +  D PO) Take by mouth daily.  Marland Kitchen donepezil (ARICEPT) 10 MG tablet TAKE ONE TABLET BY MOUTH NIGHTLY  . Multiple Vitamin (MULTIVITAMIN WITH MINERALS) TABS Take 1 tablet by mouth every morning.  Marland Kitchen MYRBETRIQ 50 MG TB24 tablet TAKE 1 TABLET BY MOUTH EVERY DAY  . potassium chloride (K-DUR) 10 MEQ tablet TAKE TWO TABLETS BY MOUTH EVERY DAY  . predniSONE (DELTASONE) 1 MG  tablet Take 2 mg by mouth daily with breakfast.  . sertraline (ZOLOFT) 100 MG tablet Take 1 tablet (100 mg total) by mouth daily.  . [DISCONTINUED] memantine (NAMENDA XR) 7 MG CP24 24 hr capsule Take 1 capsule (7 mg total) by mouth daily.   No facility-administered encounter medications on file as of 06/09/2015.    EXAM:  BP 122/64 mmHg  Temp(Src) 98.3 F (36.8 C) (Oral)  Ht 5' 3.75" (1.619 m)  Wt 157 lb 8 oz (71.442 kg)  BMI 27.26 kg/m2  Body mass index is 27.26 kg/(m^2).  Physical Exam: Vital signs reviewed ZOX:WRUE is a well-developed well-nourished alert cooperative   who appears stated age in no acute distress. Her gait is easy response to instruction has a normal conversation but may not remember details. HEENT: normocephalic atraumatic , Eyes: PERRL EOM's full, conjunctiva clear, Nares: paten,t no deformity discharge or tenderness., Ears: no deformity EAC's clear TMs with normal landmarks. Mouth: clear OP, no lesions, edema.  Moist mucous membranes. Dentition in adequate repair. NECK: supple without masses, thyromegaly or bruits. CHEST/PULM:  Clear to auscultation and percussion breath sounds equal no wheeze , rales or rhonchi. Breasts no nodules or discharge CV: PMI is nondisplaced, S1 S2 no gallops, murmurs, rubs. Peripheral pulses are full without delay.No JVD .  ABDOMEN: Bowel sounds normal nontender  No guard or rebound, no hepato splenomegal no CVA tenderness.   Extremtities:  No clubbing cyanosis or edema, no acute joint swelling or redness no focal atrophy NEURO:  Oriented x3, cranial nerves 3-12 appear to be intact, no obvious focal weakness,gait within normal limits no abnormal reflexes or asymmetrical SKIN: No acute rashes normal turgor, color, no bruising or petechiae. Has chronic changes from previous injury lower extremities bruises ulcers. Left forearm has a fading ecchymoses that is round. PSYCH: , good eye contact,   appears pleasant and conversational time person  place LN: no cervical axillary inguinal adenopathy No noted deficits in  attention, and speech.   Lab Results  Component Value Date   WBC 10.9* 11/16/2014   HGB 15.3* 11/16/2014   HCT 44.3 11/16/2014   PLT 231 11/16/2014   GLUCOSE 84 03/24/2015   CHOL 252* 01/21/2014   TRIG 109.0 01/21/2014   HDL 51.70 01/21/2014   LDLDIRECT 173.0 08/07/2012   LDLCALC 179* 01/21/2014   ALT 21 01/21/2014   AST 27 01/21/2014   NA 143 03/24/2015   K 3.7 03/24/2015   CL 102 03/24/2015   CREATININE 0.68 03/24/2015   BUN 26 03/24/2015   CO2 27 03/24/2015   TSH 3.67 01/21/2014   INR 1.0 ratio 12/07/2008    ASSESSMENT AND PLAN:  Discussed the following assessment and plan:  Visit for preventive health examination  Medicare annual wellness visit, subsequent  Age factor  Polymyalgia rheumatica (HCC)  Major depressive disorder, single episode, moderate (HCC)  Dementia, without behavioral disturbance  Essential hypertension, benign  Decreased hearing, bilateral - Consider reevaluation assistance may help social and cognition.  side effect namenda Multiple providers and agencies on team    patient wants to come back to [place]  for PCP n  Best use of  Care teams for her health discussed. May need specialty care in the future aware. Review medications on a regular basis to make sure they're more helpful than harmful. This may change as she goes. Has been asked to fill out a driving form her risk is age and cognition but is never had an accident. family should reevaluate with her. And drive with her.  Extended time spent over an above preventive care visit today in regard to her social situation change of medical teams   discussed in about her cognitive decline although her depression is better.  We discussed blood work and monitoring not helpful today okay not to blood work today. Reviewed prevention healthcare maintenance living situation safety issues. Patient Care Team: Madelin HeadingsWanda K Panosh, MD as  PCP - General (Internal Medicine) Donnetta HailJames F Beekman, MD (Rheumatology) Ernesto Rutherfordobert Groat, MD (Ophthalmology) Nita SellsJohn Hall, MD (Dermatology) Crist FatBenjamin W Herrick, MD as Attending Physician (Urology)  Patient Instructions   Continue  Sertraline  At higher dose since it helps .  Consider reevaluation of hearing  If affecting attention   And hearing conversations.  Your physical exam is good.  Makke sure get regular  Eye tests.  Check with urology  ? Is medication really helping you or not.  Advise limited driving to local/ daylight  And consider  Stopping driving  reducing this is memory  Deteriorates. To be able to attend .  Can do driving evaluation   To further assess  yearly flu vaccine  Glad that increase in the sertraline has been helpful.  Lab Results  Component Value Date   WBC 10.9* 11/16/2014   HGB 15.3* 11/16/2014   HCT 44.3 11/16/2014   PLT 231 11/16/2014   GLUCOSE 84 03/24/2015   CHOL 252* 01/21/2014   TRIG 109.0 01/21/2014   HDL 51.70 01/21/2014   LDLDIRECT 173.0 08/07/2012   LDLCALC 179* 01/21/2014   ALT 21 01/21/2014   AST 27 01/21/2014   NA 143 03/24/2015   K 3.7 03/24/2015   CL 102 03/24/2015   CREATININE 0.68 03/24/2015   BUN 26 03/24/2015   CO2 27 03/24/2015   TSH 3.67 01/21/2014   INR 1.0 ratio 12/07/2008     [name] M.D.

## 2015-06-09 NOTE — Patient Instructions (Signed)
Continue  Sertraline  At higher dose since it helps .  Consider reevaluation of hearing  If affecting attention   And hearing conversations.  Your physical exam is good.  Makke sure get regular  Eye tests.  Check with urology  ? Is medication really helping you or not.  Advise limited driving to local/ daylight  And consider  Stopping driving  reducing this is memory  Deteriorates. To be able to attend .  Can do driving evaluation   To further assess  yearly flu vaccine  Glad that increase in the sertraline has been helpful.  Lab Results  Component Value Date   WBC 10.9* 11/16/2014   HGB 15.3* 11/16/2014   HCT 44.3 11/16/2014   PLT 231 11/16/2014   GLUCOSE 84 03/24/2015   CHOL 252* 01/21/2014   TRIG 109.0 01/21/2014   HDL 51.70 01/21/2014   LDLDIRECT 173.0 08/07/2012   LDLCALC 179* 01/21/2014   ALT 21 01/21/2014   AST 27 01/21/2014   NA 143 03/24/2015   K 3.7 03/24/2015   CL 102 03/24/2015   CREATININE 0.68 03/24/2015   BUN 26 03/24/2015   CO2 27 03/24/2015   TSH 3.67 01/21/2014   INR 1.0 ratio 12/07/2008

## 2015-06-10 DIAGNOSIS — F0390 Unspecified dementia without behavioral disturbance: Secondary | ICD-10-CM | POA: Insufficient documentation

## 2015-06-10 DIAGNOSIS — T50905A Adverse effect of unspecified drugs, medicaments and biological substances, initial encounter: Secondary | ICD-10-CM | POA: Insufficient documentation

## 2015-06-10 DIAGNOSIS — H9190 Unspecified hearing loss, unspecified ear: Secondary | ICD-10-CM | POA: Insufficient documentation

## 2015-06-12 ENCOUNTER — Other Ambulatory Visit: Payer: Self-pay | Admitting: Internal Medicine

## 2015-06-15 ENCOUNTER — Telehealth: Payer: Self-pay | Admitting: Internal Medicine

## 2015-06-15 NOTE — Telephone Encounter (Addendum)
Pt is still have sinus symptoms.  Daughter states they discussed at appointment on 10/12 and dr Fabian Sharppanosh asked her to get back if did not get better. Pt has a little cough, but lots af drainage and blows nose constantly. Daughter would like to know if dr Fabian SharpPanosh would call in something for these symptoms  [facility]   Also pt's PCP was changed to dr Renato Gailseed on 06/14/15.  Daughter states pt does not see dr reed anymore and dr Fabian Sharppanosh is her pcp. Ok to change back to dr Fabian Sharppanosh?

## 2015-06-15 NOTE — Telephone Encounter (Signed)
No fever or productive cough.  Continues to have some cough.  Clear nasal mucus.  Daughter states that she thinks Kathie RhodesBetty is getting better but annoyed with the symptoms.  Please advise.  Thanks!

## 2015-06-15 NOTE — Telephone Encounter (Signed)
Please call [phone] this afternoon if something can be called in.

## 2015-06-16 MED ORDER — BENZONATATE 100 MG PO CAPS: 100.0000 mg | ORAL_CAPSULE | Freq: Three times a day (TID) | ORAL | Status: DC

## 2015-06-16 NOTE — Telephone Encounter (Signed)
Spoke to Tricia Mendez, advised she get OTC nasal cortisone such as flonase or nasacort and will send in rx for [name].  Cautioned about dizziness.  Instructed Tricia Mendez to call back if needed.

## 2015-06-16 NOTE — Telephone Encounter (Signed)
Would like her to use nasal cortisone for at least 2 weeks for the the post nasal drainage . flonase or nasacort 2 sprays each nostril each day   Can call in  Refill x 3 or get OTC .  RX  tessalon perles for comfort    Decreases the cough reflex .  Please explain to her that lthough not a sedative caution that can make some people dizzy so caution when first try it .   tessalon perles 100 mg 1 po tid as needed for cough   Disp 24 refill x 1

## 2015-06-16 NOTE — Telephone Encounter (Signed)
Daughter following up on request for rx for pt.

## 2015-06-17 ENCOUNTER — Encounter: Payer: Self-pay | Admitting: Family Medicine

## 2015-06-17 ENCOUNTER — Ambulatory Visit (INDEPENDENT_AMBULATORY_CARE_PROVIDER_SITE_OTHER): Payer: Medicare Other | Admitting: Family Medicine

## 2015-06-17 VITALS — BP 110/72 | HR 70 | Temp 98.9°F | Ht 65.0 in | Wt 158.0 lb

## 2015-06-17 DIAGNOSIS — I10 Essential (primary) hypertension: Secondary | ICD-10-CM

## 2015-06-17 DIAGNOSIS — J209 Acute bronchitis, unspecified: Secondary | ICD-10-CM | POA: Diagnosis not present

## 2015-06-17 DIAGNOSIS — M353 Polymyalgia rheumatica: Secondary | ICD-10-CM

## 2015-06-17 MED ORDER — PREDNISONE 20 MG PO TABS
20.0000 mg | ORAL_TABLET | Freq: Every day | ORAL | Status: DC
Start: 2015-06-17 — End: 2015-06-22

## 2015-06-17 MED ORDER — CEFDINIR 300 MG PO CAPS: 300.0000 mg | ORAL_CAPSULE | Freq: Two times a day (BID) | ORAL | Status: AC

## 2015-06-17 NOTE — Progress Notes (Signed)
Pre visit review using our clinic review tool, if applicable. No additional management support is needed unless otherwise documented below in the visit note. 

## 2015-06-17 NOTE — Patient Instructions (Addendum)
Encouraged increased rest and hydration, add probiotics, zinc such as Coldeze or Xicam. Treat fevers as needed.   Probiotics daily, Digestive Advantage or [employer], NOW company at [employer], 10 strain probiotic Elderberry liquid for cough and sore throat and/or warm tea with lemon and honey, vitamin C 500 mg daily Plain Mucinex twice daily   Acute Bronchitis Bronchitis is inflammation of the airways that extend from the windpipe into the lungs (bronchi). The inflammation often causes mucus to develop. This leads to a cough, which is the most common symptom of bronchitis.  In acute bronchitis, the condition usually develops suddenly and goes away over time, usually in a couple weeks. Smoking, allergies, and asthma can make bronchitis worse. Repeated episodes of bronchitis may cause further lung problems.  CAUSES Acute bronchitis is most often caused by the same virus that causes a cold. The virus can spread from person to person (contagious) through coughing, sneezing, and touching contaminated objects. SIGNS AND SYMPTOMS   Cough.   Fever.   Coughing up mucus.   Body aches.   Chest congestion.   Chills.   Shortness of breath.   Sore throat.  DIAGNOSIS  Acute bronchitis is usually diagnosed through a physical exam. Your health care provider will also ask you questions about your medical history. Tests, such as chest X-rays, are sometimes done to rule out other conditions.  TREATMENT  Acute bronchitis usually goes away in a couple weeks. Oftentimes, no medical treatment is necessary. Medicines are sometimes given for relief of fever or cough. Antibiotic medicines are usually not needed but may be prescribed in certain situations. In some cases, an inhaler may be recommended to help reduce shortness of breath and control the cough. A cool mist vaporizer may also be used to help thin bronchial secretions and make it easier to clear the chest.  HOME CARE  INSTRUCTIONS  Get plenty of rest.   Drink enough fluids to keep your urine clear or pale yellow (unless you have a medical condition that requires fluid restriction). Increasing fluids may help thin your respiratory secretions (sputum) and reduce chest congestion, and it will prevent dehydration.   Take medicines only as directed by your health care provider.  If you were prescribed an antibiotic medicine, finish it all even if you start to feel better.  Avoid smoking and secondhand smoke. Exposure to cigarette smoke or irritating chemicals will make bronchitis worse. If you are a smoker, consider using nicotine gum or skin patches to help control withdrawal symptoms. Quitting smoking will help your lungs heal faster.   Reduce the chances of another bout of acute bronchitis by washing your hands frequently, avoiding people with cold symptoms, and trying not to touch your hands to your mouth, nose, or eyes.   Keep all follow-up visits as directed by your health care provider.  SEEK MEDICAL CARE IF: Your symptoms do not improve after 1 week of treatment.  SEEK IMMEDIATE MEDICAL CARE IF:  You develop an increased fever or chills.   You have chest pain.   You have severe shortness of breath.  You have bloody sputum.   You develop dehydration.  You faint or repeatedly feel like you are going to pass out.  You develop repeated vomiting.  You develop a severe headache. MAKE SURE YOU:   Understand these instructions.  Will watch your condition.  Will get help right away if you are not doing well or get worse.   This information is not intended to replace  advice given to you by your health care provider. Make sure you discuss any questions you have with your health care provider.   Document Released: 09/21/2004 Document Revised: 09/04/2014 Document Reviewed: 02/04/2013 Elsevier Interactive Patient Education [employer].

## 2015-06-19 ENCOUNTER — Ambulatory Visit (INDEPENDENT_AMBULATORY_CARE_PROVIDER_SITE_OTHER): Payer: Medicare Other | Admitting: Internal Medicine

## 2015-06-19 ENCOUNTER — Ambulatory Visit (INDEPENDENT_AMBULATORY_CARE_PROVIDER_SITE_OTHER): Payer: Medicare Other

## 2015-06-19 VITALS — BP 145/73 | HR 60 | Temp 98.2°F | Resp 20 | Ht 63.5 in | Wt 159.6 lb

## 2015-06-19 DIAGNOSIS — R059 Cough, unspecified: Secondary | ICD-10-CM

## 2015-06-19 DIAGNOSIS — R05 Cough: Secondary | ICD-10-CM

## 2015-06-19 DIAGNOSIS — J4520 Mild intermittent asthma, uncomplicated: Secondary | ICD-10-CM

## 2015-06-19 DIAGNOSIS — H109 Unspecified conjunctivitis: Secondary | ICD-10-CM

## 2015-06-19 MED ORDER — PREDNISONE 20 MG PO TABS: ORAL_TABLET | ORAL | Status: DC

## 2015-06-19 MED ORDER — PROMETHAZINE-DM 6.25-15 MG/5ML PO SYRP: 5.0000 mL | ORAL_SOLUTION | Freq: Four times a day (QID) | ORAL | Status: DC | PRN

## 2015-06-19 MED ORDER — ERYTHROMYCIN 5 MG/GM OP OINT: 1.0000 "application " | TOPICAL_OINTMENT | Freq: Three times a day (TID) | OPHTHALMIC | Status: DC

## 2015-06-19 NOTE — Progress Notes (Addendum)
Subjective:    Patient ID: Tricia Mendez, female    DOB: 09-13-1923, 81 y.o.   MRN: [id number] This chart was scribed for Ellamae Sia, MD by Jolene Provost, Medical Scribe. This patient was seen in Room 9 and the patient's care was started a 12:52 PM.  Chief Complaint  Patient presents with  . Cough    r/o pneumonia X 1 week    HPI HPI Comments: Tricia Mendez is a 81 y.o. female who presents to [facility] complaining of a cough for the last week. She was prescribed abx and tessalon pearls two days ago, and states she has taken both without relief. She states she coughed all night last night, and was unable to sleep. She denies sore throat or fever. She is worried she may have an pneumonia.   Past Medical History  Diagnosis Date  . Cellulitis of left leg   . Eczema   . Hypertension   . Mild cognitive impairment   . Osteoarthritis   . Polymyalgia rheumatica (HCC)     rx with prednisone  . Varicose veins   . Xerosis of skin   . Urinary incontinence   . History of renal stone     excision  . Cataract   . Depression     Review of Systems  Constitutional: Negative for fever and chills.  HENT: Positive for congestion. Negative for postnasal drip.   Respiratory: Positive for cough and wheezing.   Cardiovascular: Negative for chest pain and leg swelling.  Psychiatric/Behavioral: Positive for sleep disturbance.      Objective:  BP 145/73 mmHg  Pulse 60  Temp(Src) 98.2 F (36.8 C) (Oral)  Resp 20  Ht 5' 3.5" (1.613 m)  Wt 159 lb 9.6 oz (72.394 kg)  BMI 27.82 kg/m2  SpO2 98%  Physical Exam  Constitutional: She is oriented to person, place, and time. She appears well-developed and well-nourished. No distress.  HENT:  Head: Normocephalic and atraumatic.  Mouth/Throat: Oropharynx is clear and moist.  Eyes: Pupils are equal, round, and reactive to light.  L conj injected with purulent d/c medial canthus O.S.  Neck: Neck supple.  Cardiovascular: Normal rate, regular rhythm,  normal heart sounds and intact distal pulses.   No murmur heard. Pulmonary/Chest: Effort normal. No respiratory distress. She has wheezes.  wheezing bilat posterior with forced expiration  Musculoskeletal: Normal range of motion.  Neurological: She is alert and oriented to person, place, and time. Coordination normal.  Skin: Skin is warm and dry. She is not diaphoretic.  Psychiatric: She has a normal mood and affect. Her behavior is normal.  Nursing note and vitals reviewed.  [facility] reading (PRIMARY) by  Dr. Merla Riches: no infiltrates/effusions      Assessment & Plan:  Cough -2 to LRI Conjunctivitis of left eye RAD (reactive airway disease), mild intermittent, uncomplicated ---she feels like she is worse and not responding to rx because of the wheezing  Meds ordered this encounter  Medications  . predniSONE (DELTASONE) 20 MG tablet    Sig: 3/3/2/2/1/1 single daily dose for 6 days-ahe has 6 tabs left from recent prescription    Dispense:  6 tablet    Refill:  0  . promethazine-dextromethorphan (PROMETHAZINE-DM) 6.25-15 MG/5ML syrup    Sig: Take 5 mLs by mouth 4 (four) times daily as needed for cough.    Dispense:  118 mL    Refill:  0  . erythromycin ophthalmic ointment    Sig: Place 1 application into the left eye  3 (three) times daily. Use a 1cm ribbon size of ointment    Dispense:  3.5 g    Refill:  0  --might as well continue omnicef at this point  F/u PCP 5d if not well  I have completed the patient encounter in its entirety as documented by the scribe, with editing by me where necessary. Vivian Okelley P. Merla Richesoolittle, M.D.   By signing my name below, I, Javier Dockerobert Ryan Halas, attest that this documentation has been prepared under the direction and in the presence of Ellamae Siaobert Treyden Hakim, MD. Electronically Signed: Javier Dockerobert Ryan Halas, ER Scribe. 06/19/2015. 12:54 PM.

## 2015-06-21 ENCOUNTER — Telehealth: Payer: Self-pay | Admitting: Internal Medicine

## 2015-06-21 NOTE — Telephone Encounter (Signed)
Seen on 06/19/15

## 2015-06-21 NOTE — Telephone Encounter (Signed)
[facility] Night - Client TELEPHONE ADVICE RECORD [facility] Patient Name: Tricia SladeBETTY Mendez Gender: Female DOB: 07/26/1924 Age: 81 Y 2 M Return Phone Number: [phone] (Primary), [phone] (Secondary) Address: City/State/Zip: [place] Client [facility] Night - Client Client Site [facility] - Night Physician Berniece AndreasPanosh, Wanda Contact Type Call Call Type Triage / Clinical Caller Name Warnell BureauCarol Baker Relationship To Patient Daughter Return Phone Number [phone] (Secondary) Chief Complaint Cough Initial Comment Caller states mother been coughing all night, has bronchitis, afraid she might have pneumonia PreDisposition Go to Urgent Care/Walk-In Clinic Nurse Assessment Nurse: Odis LusterBowers, RN, Bjorn Loserhonda Date/Time Lamount Cohen(Eastern Time): 06/19/2015 9:26:39 AM Confirm and document reason for call. If symptomatic, describe symptoms. ---Caller states mother been coughing all night, has bronchitis, afraid she might have pneumonia. Saw MD a couple days ago and was to see her again, gave script for Benzonatate (tessalon pearls). Reports little effectiveness. Flonase was also called in. Symptoms have gotten worse, MD seen once again and prescribed Cefdinir 300mg  and has been taking BID (since 10/20) and prednisone prescribed. Reports cough is productive at times. Denies fever. Keeping patient from sleeping. Dx given by MD was bronchitis. Lives in a retirement community. Denies pain with deep breath. Has the patient traveled out of the country within the last 30 days? ---No Does the patient have any new or worsening symptoms? ---Yes Will a triage be completed? ---Yes Related visit to physician within the last 2 weeks? ---Yes Does the PT have any chronic conditions? (i.e. diabetes, asthma, etc.) ---Yes List chronic conditions. ---polymialgia rheumatic; hypertension Nurse: Odis LusterBowers, RN, Bjorn Loserhonda Date/Time (Eastern Time): 06/19/2015  9:38:02 AM Please select the assessment type ---Verbal order / New medication order Additional Documentation ---Persistent cough Does the client directives allow for assistance with medications after hours? ---Yes Other current medications? ---Yes List current medications. ---Cefdinir Prednisone Flonase Benzonatate (Tessalon Pearls) Atenolol Sertraline Myrbetriq Vits PLEASE NOTE: All timestamps contained within this report are represented as [place] Standard Time. CONFIDENTIALTY NOTICE: This fax transmission is intended only for the addressee. It contains information that is legally privileged, confidential or otherwise protected from use or disclosure. If you are not the intended recipient, you are strictly prohibited from reviewing, disclosing, copying using or disseminating any of this information or taking any action in reliance on or regarding this information. If you have received this fax in error, please notify us immediately by telephone so that we can arrange for its return to us. Phone: [phone], Toll-Free: [phone], Fax: [phone] Page: 2 of 3 Call Id: [id number] Nurse Assessment Medication allergies? ---No Pharmacy name and phone number. [facility] [phone] Does the client directive allow for RN to call in the medication order to the pharmacy? ---No Additional Documentation ---Advised that nurse will call MD to update. Guidelines Guideline Title Affirmed Question Affirmed Notes Nurse Date/Time Lamount Cohen(Eastern Time) Infection on Antibiotic Follow-up Call [1] Taking antibiotics > 24 hours AND [2] symptoms Darrin NipperWORSE Bowers, RN, Bjorn LoserRhonda 06/19/2015 9:34:17 AM Disp. Time Lamount Cohen(Eastern Time) Disposition Final User 06/19/2015 9:44:06 AM [facility] Bowers, RN, Bjorn LoserRhonda 06/19/2015 10:43:59 AM [facility], RN, Bjorn LoserRhonda 06/19/2015 11:09:52 AM Called On-Call Provider Odis LusterBowers, RN, Bjorn Loserhonda 06/19/2015 9:37:25 AM Call PCP Now Yes  Odis LusterBowers, RN, Juliene Pinahonda Caller Understands: Yes Disagree/Comply: Comply Care Advice Given Per Guideline CALL PCP NOW: You need to discuss this with your doctor. I'll page him now. If you haven't heard from the on-call doctor within 30 minutes, call  again. CALL BACK IF: * You have more questions or concerns * You become worse. CARE ADVICE given per Infection on Antibiotics Follow-Up Call (Adult) guideline. After Care Instructions Given Call Event Type User Date / Time Description Paging DoctorName Phone DateTime Result/Outcome Message Type Notes Everlene Other [phone] 06/19/2015 9:44:06 AM Paged On Call Back to Call Center Doctor Paged Dr. Adriana Simas [phone] 06/19/2015 10:43:59 AM Paged On Call Back to Call Center Doctor Paged Dr. Adriana Simas [phone] 06/19/2015 11:09:52 AM Called On Call Provider - Reached Doctor Paged PLEASE NOTE: All timestamps contained within this report are represented as [place] Standard Time. CONFIDENTIALTY NOTICE: This fax transmission is intended only for the addressee. It contains information that is legally privileged, confidential or otherwise protected from use or disclosure. If you are not the intended recipient, you are strictly prohibited from reviewing, disclosing, copying using or disseminating any of this information or taking any action in reliance on or regarding this information. If you have received this fax in error, please notify us immediately by telephone so that we can arrange for its return to [place]. Phone: [phone], Toll-Free: [phone], Fax: [phone] Page: 3 of 3 Call Id: [id number]

## 2015-06-22 ENCOUNTER — Ambulatory Visit (INDEPENDENT_AMBULATORY_CARE_PROVIDER_SITE_OTHER): Payer: Medicare Other | Admitting: Internal Medicine

## 2015-06-22 ENCOUNTER — Encounter: Payer: Self-pay | Admitting: Internal Medicine

## 2015-06-22 ENCOUNTER — Telehealth: Payer: Self-pay | Admitting: Internal Medicine

## 2015-06-22 VITALS — BP 134/70 | HR 64 | Temp 98.2°F | Ht 63.5 in | Wt 162.0 lb

## 2015-06-22 DIAGNOSIS — J40 Bronchitis, not specified as acute or chronic: Secondary | ICD-10-CM

## 2015-06-22 DIAGNOSIS — F0390 Unspecified dementia without behavioral disturbance: Secondary | ICD-10-CM | POA: Diagnosis not present

## 2015-06-22 DIAGNOSIS — R4189 Other symptoms and signs involving cognitive functions and awareness: Secondary | ICD-10-CM | POA: Diagnosis not present

## 2015-06-22 DIAGNOSIS — J988 Other specified respiratory disorders: Secondary | ICD-10-CM | POA: Diagnosis not present

## 2015-06-22 DIAGNOSIS — J22 Unspecified acute lower respiratory infection: Secondary | ICD-10-CM

## 2015-06-22 MED ORDER — HYDROCODONE-HOMATROPINE 5-1.5 MG/5ML PO SYRP: ORAL_SOLUTION | ORAL | Status: DC

## 2015-06-22 MED ORDER — IPRATROPIUM-ALBUTEROL 0.5-2.5 (3) MG/3ML IN SOLN
3.0000 mL | Freq: Once | RESPIRATORY_TRACT | Status: AC
  Administered 2015-06-22: 3 mL via RESPIRATORY_TRACT

## 2015-06-22 MED ORDER — DOXYCYCLINE HYCLATE 100 MG PO TABS
100.0000 mg | ORAL_TABLET | Freq: Two times a day (BID) | ORAL | Status: DC
Start: 2015-06-22 — End: 2016-03-20

## 2015-06-22 MED ORDER — ALBUTEROL SULFATE HFA 108 (90 BASE) MCG/ACT IN AERS: 2.0000 | INHALATION_SPRAY | Freq: Four times a day (QID) | RESPIRATORY_TRACT | Status: DC | PRN

## 2015-06-22 NOTE — Patient Instructions (Addendum)
Can try  Hydrocodone cough medication at night   This is a narcotic but may help sleep.  Stop the  promethezine dm   Change to delsym   5 cc Twice a day  If needed for cough  Mucinex plain bid is and expectorant.  600 12 hours twice a day   Continue the prednisone dose  40 mg , 20 mg 20 mg  . Change antibiotic to  Doxycycline  100 mg 1 po  Bid for 5 days disp 10   Add inhaler  To use 2 puffs every 6 hours as needed for cough and wheeze.  [facility]   Plan ROV in about a  week if not a lot better

## 2015-06-22 NOTE — Telephone Encounter (Signed)
Spoke to [place] and advised that the pt was seen by Dr Fabian SharpPanosh this morning and a form was filled out.  Pt's sister should be bringing it by.

## 2015-06-22 NOTE — Telephone Encounter (Signed)
[name] to say that she need to know the Mucinex strength and the end date and also how many sprays of flonex in each nostril .      Bertram Millarderesa [facility] [phone] ext [phone]

## 2015-06-22 NOTE — Progress Notes (Signed)
Pre visit review using our clinic review tool, if applicable. No additional management support is needed unless otherwise documented below in the visit note.  Chief Complaint  Patient presents with  . Follow-up    HPI: Patient Tricia Mendez  comes in today for SDA for   noging problem evaluation. Here with daughter   Cough progressing over 10 days and has been seen x 2 for this  rx for bornchitis and wheezing  Seen 10 20 sat clinic anrx with pred 20 qd x 5  and cefdinit  X 10 days  Seen [facility]  10 22 c X ray no pna  Poss copd   Given premethezine dm  pred 20 another 6 days?  And erythromycin eye opintment ofr re d eye  Had wheezes   No  inhlaer given  given . No fever but has runny nose  Feels bad  And cough"sounds deep anmd bad"   She is not [facility] and needs from and directions for  Medication use .   Pt doesn't remember details of progression of her illness  No hemoptysis  Yellow green phelgm .  ROS: See pertinent positives and negatives per HPI. No vomiting rash chills   Past Medical History  Diagnosis Date  . Cellulitis of left leg   . Eczema   . Hypertension   . Mild cognitive impairment   . Osteoarthritis   . Polymyalgia rheumatica (HCC)     rx with prednisone  . Varicose veins   . Xerosis of skin   . Urinary incontinence   . History of renal stone     excision  . Cataract   . Depression     Family History  Problem Relation Age of Onset  . Melanoma Mother     69  . Breast cancer Mother     69  . Stroke Father     26  . Arthritis Father     10  . Osteoporosis Other     Social History   Social History  . Marital Status: Widowed    Spouse Name: N/A  . Number of Children: N/A  . Years of Education: N/A   Occupational History  . retired Runner, broadcasting/film/video    Social History Main Topics  . Smoking status: Former Smoker    Start date: 08/07/1944    Quit date: 10/13/1945  . Smokeless tobacco: Former Neurosurgeon    Quit date:  10/01/1945  . Alcohol Use: No  . Drug Use: No  . Sexual Activity: Not Asked   Other Topics Concern  . None   Social History Narrative   Widowed remote hx of tobacco x 4 years   [facility].   G4P3   Daughters have HCPOA  If needed      Diet: Regular      Do you drink/ eat things with caffeine? coffee      Marital status: Married                              What year were you married ? Twice, 1st 1948, 2nd 1992      Do you live in a house, apartment,assistred living, condo, trailer, etc.)? Appartment      Is it one or more stories? No      How many persons live in your home ? 1      Do you have  any pets in your home ?(please list) No      Current or past profession: Research officer, trade union       Do you exercise? Yes                             Type & how often: Walk daily      Do you have a living will? Yes      Do you have a DNR form? No                      If not, do you want to discuss one? Yes      Do you have signed POA?HPOA forms?  Yes               If so, please bring to your        appointment             Outpatient Prescriptions Prior to Visit  Medication Sig Dispense Refill  . ALREX 0.2 % SUSP Instill 1 drop in both eyes twice daily as needed  6  . atenolol (TENORMIN) 50 MG tablet TAKE 1 TABLET BY MOUTH EVERY DAY 30 tablet 5  . Calcium Carb-Cholecalciferol (CALCIUM 600 + D PO) Take by mouth daily.    . cefdinir (OMNICEF) 300 MG capsule Take 1 capsule (300 mg total) by mouth 2 (two) times daily. 20 capsule 0  . donepezil (ARICEPT) 10 MG tablet TAKE ONE TABLET BY MOUTH NIGHTLY 90 tablet 0  . erythromycin ophthalmic ointment Place 1 application into the left eye 3 (three) times daily. Use a 1cm ribbon size of ointment 3.5 g 0  . fluticasone (FLONASE) 50 MCG/ACT nasal spray Place into both nostrils daily.    . Multiple Vitamin (MULTIVITAMIN WITH MINERALS) TABS Take 1 tablet by mouth every morning.    . potassium chloride (K-DUR) 10 MEQ tablet TAKE  TWO TABLETS BY MOUTH EVERY DAY 60 tablet 0  . predniSONE (DELTASONE) 20 MG tablet 3/3/2/2/1/1 single daily dose for 6 days-ahe has 6 tabs left from recent prescription 6 tablet 0  . promethazine-dextromethorphan (PROMETHAZINE-DM) 6.25-15 MG/5ML syrup Take 5 mLs by mouth 4 (four) times daily as needed for cough. 118 mL 0  . sertraline (ZOLOFT) 100 MG tablet Take 1 tablet (100 mg total) by mouth daily. 30 tablet 3  . MYRBETRIQ 50 MG TB24 tablet TAKE 1 TABLET BY MOUTH EVERY DAY (Patient not taking: Reported on 06/19/2015) 30 tablet 5  . benzonatate (TESSALON) 100 MG capsule Take 1 capsule (100 mg total) by mouth 3 (three) times daily. 24 capsule 0  . predniSONE (DELTASONE) 1 MG tablet Take 2 mg by mouth daily with breakfast.    . predniSONE (DELTASONE) 20 MG tablet Take 1 tablet (20 mg total) by mouth daily with breakfast. 10 tablet 0   No facility-administered medications prior to visit.     EXAM:  BP 134/70 mmHg  Pulse 64  Temp(Src) 98.2 F (36.8 C) (Oral)  Ht 5' 3.5" (1.613 m)  Wt 162 lb (73.483 kg)  BMI 28.24 kg/m2  SpO2 97%  Body mass index is 28.24 kg/(m^2).  GENERAL: vitals reviewed and listed above, alert,, appears well hydrated and in no acute distress looks tored  Mild ill  Non toxic no rep distress cough is deep bronchial somewhat loose increase loose ease after neb.  HEENT: atraumatic, conjunctiva  clear, no obvious abnormalities on inspection of external nose and  ears tms clear  Op clear nares mucoud dc no face pain left eye minimally watery pinkOP : no lesion edema or exudate  NECK: no obvious masses on inspection palpation  LUNGS:  bs =  Exp wheeze ocass  No rales  Some tightness  After cuoneb she cannt comment on result but increase bs on exam and less wheezing  CV: HRRR, no clubbing cyanosis or  peripheral edema nl cap refill  MS: moves all extremities without noticeable focal  abnormality  Alert verbal  Dec memory   ASSESSMENT AND PLAN:  Discussed the following  assessment and plan:  Wheezy bronchitis - Plan: ipratropium-albuterol (DUONEB) 0.5-2.5 (3) MG/3ML nebulizer solution 3 mL  Acute respiratory infection - Plan: ipratropium-albuterol (DUONEB) 0.5-2.5 (3) MG/3ML nebulizer solution 3 mL  Cognitive deficits  Dementia, without behavioral disturbance  poss underlying copd but no prev dx  Sever sx of concern to family still copious phelgm  May not responding ing to antibiotic but has had 5 days of omnicef  swith to doxy for now  Caution hydrocodone at night if monitored    Since promethazine and tessalon no help  Can stay on mucinex 600 bid   Dm in day delsym 5 cc  Cautions . Cont pred taper as directed  Add bronchodilator   Prolonged visit today   Counseling cartaker and patient and [facility] for  [facility]  Step up unit   Patient is chagrined that her memory is so bad.   Perhaps a bit worse since ill.  -Patient advised to return or notify health care team  if symptoms worsen ,persist or new concerns arise. For best communication ROV in about a week   Or as needed  Total visit 40mins > 50% spent counseling and coordinating care as indicated in above note and in instructions to patient .  Orders completed for [facility] alos med lsit and changes reviewed with daughter Patient Instructions  Can try  Hydrocodone cough medication at night   This is a narcotic but may help sleep.  Stop the  promethezine dm   Change to delsym   5 cc Twice a day  If needed for cough  Mucinex plain bid is and expectorant.  600 12 hours twice a day   Continue the prednisone dose  40 mg , 20 mg 20 mg  . Change antibiotic to  Doxycycline  100 mg 1 po  Bid for 5 days disp 10   Add inhaler  To use 2 puffs every 6 hours as needed for cough and wheeze.  [facility]   Plan ROV in about a  week if not a lot better       Qwest CommunicationsWanda K. Sham Alviar M.D.

## 2015-06-23 ENCOUNTER — Telehealth: Payer: Self-pay | Admitting: Internal Medicine

## 2015-06-23 NOTE — Telephone Encounter (Signed)
Sprays each nostril once a day

## 2015-06-23 NOTE — Telephone Encounter (Signed)
Should pt use 1 or 2 sprays daily?  Does not specify in the chart.  Thanks!

## 2015-06-23 NOTE — Telephone Encounter (Signed)
[name] lpn would like to know the direction for flonase. Ok to fax the order for flonase to [phone]

## 2015-06-25 MED ORDER — FLUTICASONE PROPIONATE 50 MCG/ACT NA SUSP: 1.0000 | Freq: Every day | NASAL | Status: DC

## 2015-06-27 ENCOUNTER — Encounter: Payer: Self-pay | Admitting: Family Medicine

## 2015-06-27 DIAGNOSIS — J209 Acute bronchitis, unspecified: Secondary | ICD-10-CM

## 2015-06-27 HISTORY — DX: Acute bronchitis, unspecified: J20.9

## 2015-06-27 NOTE — Assessment & Plan Note (Addendum)
Flared some with acute illness. May need course of steroids if worsens

## 2015-06-27 NOTE — Progress Notes (Signed)
Subjective:    Patient ID: Tricia Mendez, female    DOB: 02-11-1924, 81 y.o.   MRN: [id number]  Chief Complaint  Patient presents with  . Cough    nasal congestion    HPI Patient is in today for evaluation of respiratory symptoms. She has been ill for about 6 days and her symptoms are worsening. She has had and chest congestion. She has cough productive of yellow phlegm. She has cough that is dry at times. She has clear rhinorrhea and she endorses low-grade fevers, fatigue and myalgias. She denies anorexia and gastrointestinal symptoms. Has some Tessalon Perles and has found them only marginally helpful.Denies CP/palp/SOB/HA/GI or GU c/o. Taking meds as prescribed   Past Medical History  Diagnosis Date  . Cellulitis of left leg   . Eczema   . Hypertension   . Mild cognitive impairment   . Osteoarthritis   . Polymyalgia rheumatica (HCC)     rx with prednisone  . Varicose veins   . Xerosis of skin   . Urinary incontinence   . History of renal stone     excision  . Cataract   . Depression   . Acute bronchitis 06/27/2015    Past Surgical History  Procedure Laterality Date  . Joint replacement    . Kidney stone surgey    . Cataract surgery    . Lipoma removal    . Eye surgery      Family History  Problem Relation Age of Onset  . Melanoma Mother     34  . Breast cancer Mother     71  . Stroke Father     92  . Arthritis Father     88  . Osteoporosis Other     Social History   Social History  . Marital Status: Widowed    Spouse Name: N/A  . Number of Children: N/A  . Years of Education: N/A   Occupational History  . retired Runner, broadcasting/film/video    Social History Main Topics  . Smoking status: Former Smoker    Start date: 08/07/1944    Quit date: 10/13/1945  . Smokeless tobacco: Former Neurosurgeon    Quit date: 10/01/1945  . Alcohol Use: No  . Drug Use: No  . Sexual Activity: Not on file   Other Topics Concern  . Not on file   Social History Narrative   Widowed  remote hx of tobacco x 4 years   [facility].   G4P3   Daughters have HCPOA  If needed      Diet: Regular      Do you drink/ eat things with caffeine? coffee      Marital status: Married                              What year were you married ? Twice, 1st 1948, 2nd 1992      Do you live in a house, apartment,assistred living, condo, trailer, etc.)? Appartment      Is it one or more stories? No      How many persons live in your home ? 1      Do you have any pets in your home ?(please list) No      Current or past profession: Research officer, trade union       Do you exercise? Yes  Type & how often: Walk daily      Do you have a living will? Yes      Do you have a DNR form? No                      If not, do you want to discuss one? Yes      Do you have signed POA?HPOA forms?  Yes               If so, please bring to your        appointment             Outpatient Prescriptions Prior to Visit  Medication Sig Dispense Refill  . ALREX 0.2 % SUSP Instill 1 drop in both eyes twice daily as needed  6  . atenolol (TENORMIN) 50 MG tablet TAKE 1 TABLET BY MOUTH EVERY DAY 30 tablet 5  . Calcium Carb-Cholecalciferol (CALCIUM 600 + D PO) Take by mouth daily.    Marland Kitchen donepezil (ARICEPT) 10 MG tablet TAKE ONE TABLET BY MOUTH NIGHTLY 90 tablet 0  . Multiple Vitamin (MULTIVITAMIN WITH MINERALS) TABS Take 1 tablet by mouth every morning.    Marland Kitchen MYRBETRIQ 50 MG TB24 tablet TAKE 1 TABLET BY MOUTH EVERY DAY (Patient not taking: Reported on 06/19/2015) 30 tablet 5  . potassium chloride (K-DUR) 10 MEQ tablet TAKE TWO TABLETS BY MOUTH EVERY DAY 60 tablet 0  . sertraline (ZOLOFT) 100 MG tablet Take 1 tablet (100 mg total) by mouth daily. 30 tablet 3  . atenolol (TENORMIN) 50 MG tablet TAKE 1 TABLET BY MOUTH EVERY DAY 30 tablet 2  . benzonatate (TESSALON) 100 MG capsule Take 1 capsule (100 mg total) by mouth 3 (three) times daily. 24 capsule 0  . predniSONE (DELTASONE) 1  MG tablet Take 2 mg by mouth daily with breakfast.     No facility-administered medications prior to visit.    Allergies  Allergen Reactions  . Remeron [Mirtazapine] Other (See Comments)    Dizzy     Review of Systems  Constitutional: Positive for fever and malaise/fatigue. Negative for chills.  HENT: Positive for congestion.   Eyes: Negative for discharge.  Respiratory: Positive for cough and sputum production. Negative for shortness of breath.   Cardiovascular: Negative for chest pain, palpitations and leg swelling.  Gastrointestinal: Negative for nausea and abdominal pain.  Genitourinary: Negative for dysuria.  Musculoskeletal: Negative for falls.  Skin: Negative for rash.  Neurological: Positive for weakness. Negative for loss of consciousness and headaches.  Endo/Heme/Allergies: Negative for environmental allergies.  Psychiatric/Behavioral: Negative for depression. The patient is not nervous/anxious.        Objective:    Physical Exam  Constitutional: She is oriented to person, place, and time. She appears well-developed and well-nourished. No distress.  HENT:  Head: Normocephalic and atraumatic.  Nose: Nose normal.  Eyes: Right eye exhibits no discharge. Left eye exhibits no discharge.  Neck: Normal range of motion. Neck supple.  Cardiovascular: Normal rate and regular rhythm.   No murmur heard. Pulmonary/Chest: Effort normal. She has rales.  B/l bases  Abdominal: Soft. Bowel sounds are normal. There is no tenderness.  Musculoskeletal: She exhibits no edema.  Neurological: She is alert and oriented to person, place, and time.  Skin: Skin is warm and dry.  Psychiatric: She has a normal mood and affect.  Nursing note and vitals reviewed.   BP 110/72 mmHg  Pulse 70  Temp(Src) 98.9 F (37.2 C) (Oral)  Ht 5\' 5"  (1.651 m)  Wt 158 lb (71.668 kg)  BMI 26.29 kg/m2  SpO2 94% Wt Readings from Last 3 Encounters:  06/22/15 162 lb (73.483 kg)  06/19/15 159 lb 9.6 oz  (72.394 kg)  06/17/15 158 lb (71.668 kg)     Lab Results  Component Value Date   WBC 10.9* 11/16/2014   HGB 15.3* 11/16/2014   HCT 44.3 11/16/2014   PLT 231 11/16/2014   GLUCOSE 84 03/24/2015   CHOL 252* 01/21/2014   TRIG 109.0 01/21/2014   HDL 51.70 01/21/2014   LDLDIRECT 173.0 08/07/2012   LDLCALC 179* 01/21/2014   ALT 21 01/21/2014   AST 27 01/21/2014   NA 143 03/24/2015   K 3.7 03/24/2015   CL 102 03/24/2015   CREATININE 0.68 03/24/2015   BUN 26 03/24/2015   CO2 27 03/24/2015   TSH 3.67 01/21/2014   INR 1.0 ratio 12/07/2008    Lab Results  Component Value Date   TSH 3.67 01/21/2014   Lab Results  Component Value Date   WBC 10.9* 11/16/2014   HGB 15.3* 11/16/2014   HCT 44.3 11/16/2014   MCV 85.4 11/16/2014   PLT 231 11/16/2014   Lab Results  Component Value Date   NA 143 03/24/2015   K 3.7 03/24/2015   CO2 27 03/24/2015   GLUCOSE 84 03/24/2015   BUN 26 03/24/2015   CREATININE 0.68 03/24/2015   BILITOT 0.8 01/21/2014   ALKPHOS 57 01/21/2014   AST 27 01/21/2014   ALT 21 01/21/2014   PROT 6.7 01/21/2014   ALBUMIN 4.0 01/21/2014   CALCIUM 9.7 03/24/2015   ANIONGAP 10 11/16/2014   GFR 85.27 08/25/2014   Lab Results  Component Value Date   CHOL 252* 01/21/2014   Lab Results  Component Value Date   HDL 51.70 01/21/2014   Lab Results  Component Value Date   LDLCALC 179* 01/21/2014   Lab Results  Component Value Date   TRIG 109.0 01/21/2014   Lab Results  Component Value Date   CHOLHDL 5 01/21/2014   No results found for: HGBA1C     Assessment & Plan:   Problem List Items Addressed This Visit    Polymyalgia rheumatica (HCC)    Flared some with acute illness. May need course of steroids if worsens      Essential hypertension, benign    Well controlled, no changes to meds. Encouraged heart healthy diet such as the DASH diet and exercise as tolerated.       Acute bronchitis - Primary    Encouraged increased rest and hydration, add  probiotics, zinc such as Coldeze or Xicam. Treat fevers as needed. Start Mucinex and given rx for Cefdinir         I am having Ms. Heeter start on cefdinir. I am also having her maintain her multivitamin with minerals, potassium chloride, Calcium Carb-Cholecalciferol (CALCIUM 600 + D PO), donepezil, ALREX, MYRBETRIQ, atenolol, and sertraline.  Meds ordered this encounter  Medications  . cefdinir (OMNICEF) 300 MG capsule    Sig: Take 1 capsule (300 mg total) by mouth 2 (two) times daily.    Dispense:  20 capsule    Refill:  0  . DISCONTD: predniSONE (DELTASONE) 20 MG tablet    Sig: Take 1 tablet (20 mg total) by mouth daily with breakfast.    Dispense:  10 tablet    Refill:  0     Danise EdgeBLYTH, Ludwig Tugwell, MD

## 2015-06-27 NOTE — Assessment & Plan Note (Signed)
Encouraged increased rest and hydration, add probiotics, zinc such as Coldeze or Xicam. Treat fevers as needed. Start Mucinex and given rx for Cefdinir

## 2015-06-27 NOTE — Assessment & Plan Note (Signed)
Well controlled, no changes to meds. Encouraged heart healthy diet such as the DASH diet and exercise as tolerated.  °

## 2015-06-29 NOTE — Telephone Encounter (Signed)
Medication sent to the pharmacy on 06/25/15

## 2015-06-30 ENCOUNTER — Ambulatory Visit (INDEPENDENT_AMBULATORY_CARE_PROVIDER_SITE_OTHER): Payer: Medicare Other | Admitting: Internal Medicine

## 2015-06-30 ENCOUNTER — Encounter: Payer: Self-pay | Admitting: Internal Medicine

## 2015-06-30 VITALS — BP 140/74 | HR 85 | Temp 98.6°F | Ht 63.5 in

## 2015-06-30 DIAGNOSIS — J40 Bronchitis, not specified as acute or chronic: Secondary | ICD-10-CM | POA: Diagnosis not present

## 2015-06-30 DIAGNOSIS — Z79899 Other long term (current) drug therapy: Secondary | ICD-10-CM

## 2015-06-30 DIAGNOSIS — R05 Cough: Secondary | ICD-10-CM | POA: Diagnosis not present

## 2015-06-30 DIAGNOSIS — R053 Chronic cough: Secondary | ICD-10-CM

## 2015-06-30 DIAGNOSIS — F0390 Unspecified dementia without behavioral disturbance: Secondary | ICD-10-CM | POA: Diagnosis not present

## 2015-06-30 MED ORDER — HYDROCODONE-HOMATROPINE 5-1.5 MG/5ML PO SYRP: ORAL_SOLUTION | ORAL | Status: DC

## 2015-06-30 NOTE — Patient Instructions (Addendum)
Glad you are doing better  Ok to do independent living  And cough med at night . Recheck as needed .  ROV  In 6 months  Or as needed

## 2015-06-30 NOTE — Progress Notes (Signed)
Pre visit review using our clinic review tool, if applicable. No additional management support is needed unless otherwise documented below in the visit note.  Chief Complaint  Patient presents with  . Follow-up    HPI: Tricia Mendez 81 y.o. comes in fru of medical problems including  Prolonged cough with wheezing . Here with daughter Darl Pikes  [facility]  And needs paperwork completed any orders update. She is better but still coughing  . The cough med helps her sleep some  Not having fits all the time. Eating well but trying not to eat so as not ot "get fat"  No fever no new sx  ROS: See pertinent positives and negatives per HPI.  Past Medical History  Diagnosis Date  . Cellulitis of left leg   . Eczema   . Hypertension   . Mild cognitive impairment   . Osteoarthritis   . Polymyalgia rheumatica (HCC)     rx with prednisone  . Varicose veins   . Xerosis of skin   . Urinary incontinence   . History of renal stone     excision  . Cataract   . Depression   . Acute bronchitis 06/27/2015    Family History  Problem Relation Age of Onset  . Melanoma Mother     34  . Breast cancer Mother     65  . Stroke Father     61  . Arthritis Father     6  . Osteoporosis Other     Social History   Social History  . Marital Status: Widowed    Spouse Name: N/A  . Number of Children: N/A  . Years of Education: N/A   Occupational History  . retired Runner, broadcasting/film/video    Social History Main Topics  . Smoking status: Former Smoker    Start date: 08/07/1944    Quit date: 10/13/1945  . Smokeless tobacco: Former Neurosurgeon    Quit date: 10/01/1945  . Alcohol Use: No  . Drug Use: No  . Sexual Activity: Not Asked   Other Topics Concern  . None   Social History Narrative   Widowed remote hx of tobacco x 4 years   [facility].   G4P3   Daughters have HCPOA  If needed      Diet: Regular      Do you drink/ eat things with caffeine? coffee      Marital  status: Married                              What year were you married ? Twice, 1st 1948, 2nd 1992      Do you live in a house, apartment,assistred living, condo, trailer, etc.)? Appartment      Is it one or more stories? No      How many persons live in your home ? 1      Do you have any pets in your home ?(please list) No      Current or past profession: Research officer, trade union       Do you exercise? Yes                             Type & how often: Walk daily      Do you have a living will? Yes      Do you have  a DNR form? No                      If not, do you want to discuss one? Yes      Do you have signed POA?HPOA forms?  Yes               If so, please bring to your        appointment             Outpatient Prescriptions Prior to Visit  Medication Sig Dispense Refill  . albuterol (PROVENTIL HFA;VENTOLIN HFA) 108 (90 BASE) MCG/ACT inhaler Inhale 2 puffs into the lungs every 6 (six) hours as needed for wheezing or shortness of breath. 1 Inhaler 2  . ALREX 0.2 % SUSP Instill 1 drop in both eyes twice daily as needed  6  . atenolol (TENORMIN) 50 MG tablet TAKE 1 TABLET BY MOUTH EVERY DAY 30 tablet 5  . Calcium Carb-Cholecalciferol (CALCIUM 600 + D PO) Take by mouth daily.    Marland Kitchen. donepezil (ARICEPT) 10 MG tablet TAKE ONE TABLET BY MOUTH NIGHTLY 90 tablet 0  . doxycycline (VIBRA-TABS) 100 MG tablet Take 1 tablet (100 mg total) by mouth 2 (two) times daily. 14 tablet 0  . erythromycin ophthalmic ointment Place 1 application into the left eye 3 (three) times daily. Use a 1cm ribbon size of ointment 3.5 g 0  . fluticasone (FLONASE) 50 MCG/ACT nasal spray Place 1 spray into both nostrils daily. 16 g 0  . Multiple Vitamin (MULTIVITAMIN WITH MINERALS) TABS Take 1 tablet by mouth every morning.    . potassium chloride (K-DUR) 10 MEQ tablet TAKE TWO TABLETS BY MOUTH EVERY DAY 60 tablet 0  . promethazine-dextromethorphan (PROMETHAZINE-DM) 6.25-15 MG/5ML syrup Take 5 mLs by mouth 4 (four)  times daily as needed for cough. 118 mL 0  . sertraline (ZOLOFT) 100 MG tablet Take 1 tablet (100 mg total) by mouth daily. 30 tablet 3  . HYDROcodone-homatropine (HYCODAN) 5-1.5 MG/5ML syrup 1 tsp at night for cough   Can repeat in  4 hours as needed 120 mL 0  . MYRBETRIQ 50 MG TB24 tablet TAKE 1 TABLET BY MOUTH EVERY DAY 30 tablet 5  . predniSONE (DELTASONE) 20 MG tablet 3/3/2/2/1/1 single daily dose for 6 days-ahe has 6 tabs left from recent prescription 6 tablet 0   No facility-administered medications prior to visit.     EXAM:  BP 140/74 mmHg  Pulse 85  Temp(Src) 98.6 F (37 C) (Oral)  Ht 5' 3.5" (1.613 m)  SpO2 97%  There is no weight on file to calculate BMI.  GENERAL: vitals reviewed and listed above, alert, wellgroomed  omed   appears well hydrated and in no acute distress more comfortable    Only ocass cough  HEENT: atraumatic, conjunctiva  clear, no obvious abnormalities on inspection of external nose and ears OP : no lesion edema or exudate  Tongue midline tms ok  NECK: no obvious masses on inspection palpation  LUNGS: clear to auscultation bilaterally,  No w r rh  ocass less deep cough  CV: HRRR, no clubbing cyanosis or  peripheral edema nl cap refill  MS: moves all extremities without noticeable focal  abnormality PSYCH: pleasant   Obvious memory issues  Asks?  ? Cause cant remember  BP Readings from Last 3 Encounters:  06/30/15 140/74  06/22/15 134/70  06/19/15 145/73   Wt Readings from Last 3 Encounters:  06/22/15 162 lb (73.483 kg)  06/19/15 159 lb 9.6 oz (72.394 kg)  06/17/15 158 lb (71.668 kg)    ASSESSMENT AND PLAN:  Discussed the following assessment and plan:  Wheezy bronchitis - much improved.    Cough, persistent - improved   Dementia, without behavioral disturbance  Medication management - caution with cough med all agreed No se of meds o far  Much improved    Signed orders to go back to indep living  But  Disc with daughter  To apportion out  the cough med for night to avoid  Excess dosing ofmedication  Pt and daughter agrees    Expectant management. May cough a nother few weeks  But should continue to improve  Total visit > 50% spent counseling and coordinating care as indicated in above note and in instructions to patient . -Patient advised to return or notify health care team  if symptoms worsen ,persist or new concerns arise.  Patient Instructions  Glad you are doing better  Ok to do independent living  And cough med at night . Recheck as needed .  ROV  In 6 months  Or as needed   Burna Mortimer K. Panosh M.D.

## 2015-08-05 ENCOUNTER — Ambulatory Visit: Payer: Medicare Other | Admitting: Internal Medicine

## 2015-08-17 ENCOUNTER — Other Ambulatory Visit: Payer: Self-pay | Admitting: Internal Medicine

## 2015-09-13 ENCOUNTER — Other Ambulatory Visit: Payer: Self-pay | Admitting: Internal Medicine

## 2015-09-13 NOTE — Telephone Encounter (Signed)
Sent to the pharmacy by e-scribe. 

## 2015-10-04 ENCOUNTER — Other Ambulatory Visit: Payer: Self-pay | Admitting: Internal Medicine

## 2015-10-05 NOTE — Telephone Encounter (Signed)
Sent to the pharmacy by e-scribe.  Pt should return in May 2017.

## 2015-10-06 ENCOUNTER — Other Ambulatory Visit: Payer: Self-pay | Admitting: Internal Medicine

## 2015-10-27 ENCOUNTER — Other Ambulatory Visit: Payer: Self-pay | Admitting: Internal Medicine

## 2015-11-25 ENCOUNTER — Other Ambulatory Visit: Payer: Self-pay | Admitting: Internal Medicine

## 2015-11-26 NOTE — Telephone Encounter (Signed)
Filled for 3 months on 10/05/15.  Should have another refill on file.

## 2015-12-24 ENCOUNTER — Other Ambulatory Visit: Payer: Self-pay | Admitting: Internal Medicine

## 2015-12-28 ENCOUNTER — Other Ambulatory Visit: Payer: Self-pay | Admitting: Family Medicine

## 2015-12-28 ENCOUNTER — Other Ambulatory Visit: Payer: Self-pay

## 2015-12-28 MED ORDER — MIRABEGRON ER 50 MG PO TB24: 50.0000 mg | ORAL_TABLET | Freq: Every day | ORAL | Status: DC

## 2015-12-28 NOTE — Telephone Encounter (Signed)
Rx sent to pharmacy with 5 RF.

## 2015-12-28 NOTE — Telephone Encounter (Signed)
Refill x 2 months   OV before  Runs out

## 2015-12-29 ENCOUNTER — Telehealth: Payer: Self-pay | Admitting: Family Medicine

## 2015-12-29 NOTE — Telephone Encounter (Signed)
Sch on 03/20/16 per pt daughter susan .

## 2015-12-29 NOTE — Telephone Encounter (Signed)
Sent to the pharmacy by e-scribe.  Message sent to scheduling. 

## 2015-12-29 NOTE — Telephone Encounter (Signed)
[facility]  Please help her to make that appointment.

## 2016-01-03 ENCOUNTER — Other Ambulatory Visit: Payer: Self-pay | Admitting: Internal Medicine

## 2016-01-03 NOTE — Telephone Encounter (Signed)
Sent to the pharmacy by e-scribe. 

## 2016-01-25 ENCOUNTER — Other Ambulatory Visit: Payer: Self-pay | Admitting: Internal Medicine

## 2016-01-25 ENCOUNTER — Other Ambulatory Visit: Payer: Self-pay | Admitting: General Practice

## 2016-01-25 MED ORDER — SERTRALINE HCL 100 MG PO TABS: 100.0000 mg | ORAL_TABLET | Freq: Every day | ORAL | Status: DC

## 2016-01-26 ENCOUNTER — Other Ambulatory Visit: Payer: Self-pay | Admitting: Internal Medicine

## 2016-03-06 ENCOUNTER — Other Ambulatory Visit: Payer: Self-pay | Admitting: Internal Medicine

## 2016-03-06 NOTE — Telephone Encounter (Signed)
REQUEST IS TOO EARLY.  FILLED ON 09/13/2015 FOR NINE MONTHS.

## 2016-03-20 ENCOUNTER — Ambulatory Visit (INDEPENDENT_AMBULATORY_CARE_PROVIDER_SITE_OTHER): Payer: Medicare Other | Admitting: Internal Medicine

## 2016-03-20 ENCOUNTER — Encounter: Payer: Self-pay | Admitting: Internal Medicine

## 2016-03-20 VITALS — BP 148/80 | Temp 98.3°F | Ht 63.5 in | Wt 162.2 lb

## 2016-03-20 DIAGNOSIS — Z7952 Long term (current) use of systemic steroids: Secondary | ICD-10-CM

## 2016-03-20 DIAGNOSIS — F325 Major depressive disorder, single episode, in full remission: Secondary | ICD-10-CM

## 2016-03-20 DIAGNOSIS — Z79899 Other long term (current) drug therapy: Secondary | ICD-10-CM

## 2016-03-20 DIAGNOSIS — F0390 Unspecified dementia without behavioral disturbance: Secondary | ICD-10-CM | POA: Diagnosis not present

## 2016-03-20 DIAGNOSIS — R4189 Other symptoms and signs involving cognitive functions and awareness: Secondary | ICD-10-CM | POA: Diagnosis not present

## 2016-03-20 DIAGNOSIS — R54 Age-related physical debility: Secondary | ICD-10-CM

## 2016-03-20 DIAGNOSIS — M353 Polymyalgia rheumatica: Secondary | ICD-10-CM

## 2016-03-20 DIAGNOSIS — I10 Essential (primary) hypertension: Secondary | ICD-10-CM

## 2016-03-20 DIAGNOSIS — IMO0001 Reserved for inherently not codable concepts without codable children: Secondary | ICD-10-CM

## 2016-03-20 LAB — CBC WITH DIFFERENTIAL/PLATELET
Basophils Absolute: 0 10*3/uL (ref 0.0–0.1)
Basophils Relative: 0.4 % (ref 0.0–3.0)
EOS ABS: 0.2 10*3/uL (ref 0.0–0.7)
EOS PCT: 1.8 % (ref 0.0–5.0)
HCT: 44.9 % (ref 36.0–46.0)
HEMOGLOBIN: 14.9 g/dL (ref 12.0–15.0)
Lymphocytes Relative: 32.7 % (ref 12.0–46.0)
Lymphs Abs: 3.1 10*3/uL (ref 0.7–4.0)
MCHC: 33.2 g/dL (ref 30.0–36.0)
MCV: 84.7 fl (ref 78.0–100.0)
MONO ABS: 0.7 10*3/uL (ref 0.1–1.0)
Monocytes Relative: 7.1 % (ref 3.0–12.0)
Neutro Abs: 5.4 10*3/uL (ref 1.4–7.7)
Neutrophils Relative %: 58 % (ref 43.0–77.0)
Platelets: 268 10*3/uL (ref 150.0–400.0)
RBC: 5.3 Mil/uL — AB (ref 3.87–5.11)
RDW: 14.4 % (ref 11.5–15.5)
WBC: 9.4 10*3/uL (ref 4.0–10.5)

## 2016-03-20 LAB — TSH: TSH: 3.5 u[IU]/mL (ref 0.35–4.50)

## 2016-03-20 LAB — LIPID PANEL
Cholesterol: 245 mg/dL — ABNORMAL HIGH (ref 0–200)
HDL: 52.6 mg/dL (ref >39.00)
LDL Cholesterol: 169 mg/dL — ABNORMAL HIGH (ref 0–99)
NonHDL: 192.69
TRIGLYCERIDES: 116 mg/dL (ref 0.0–149.0)
Total CHOL/HDL Ratio: 5
VLDL: 23.2 mg/dL (ref 0.0–40.0)

## 2016-03-20 LAB — BASIC METABOLIC PANEL
BUN: 24 mg/dL — AB (ref 6–23)
CHLORIDE: 100 meq/L (ref 96–112)
CO2: 32 mEq/L (ref 19–32)
CREATININE: 0.56 mg/dL (ref 0.40–1.20)
Calcium: 10.1 mg/dL (ref 8.4–10.5)
GFR: 108.11 mL/min (ref >60.00)
Glucose, Bld: 84 mg/dL (ref 70–99)
POTASSIUM: 4 meq/L (ref 3.5–5.1)
Sodium: 137 mEq/L (ref 135–145)

## 2016-03-20 LAB — VITAMIN B12: Vitamin B-12: 827 pg/mL (ref 211–911)

## 2016-03-20 LAB — VITAMIN D 25 HYDROXY (VIT D DEFICIENCY, FRACTURES): VITD: 37.56 ng/mL (ref 30.00–100.00)

## 2016-03-20 LAB — SEDIMENTATION RATE: SED RATE: 4 mm/h (ref 0–30)

## 2016-03-20 NOTE — Patient Instructions (Addendum)
  Make sure  Get dr Dierdre Forth to send [place] copy of notes.   Will notify you  of labs when available.   Will do referral  To neurologist Dr Anne Hahn bout the  Memory issues .  But   The other medication that is used is Namenda and uncertain if  You  Have been on Namenda .

## 2016-03-20 NOTE — Progress Notes (Signed)
Pre visit review using our clinic review tool, if applicable. No additional management support is needed unless otherwise documented below in the visit note. 

## 2016-03-20 NOTE — Assessment & Plan Note (Signed)
Continue same medicine today.

## 2016-03-20 NOTE — Progress Notes (Signed)
Chief Complaint  Patient presents with  . Follow-up    HPI: Tricia Mendez 82 y.o.  Comes in today with both daughters at points in the visit because of follow-up medications and concerns. She is having cognitive decline and a few weeks ago stopped the Aricept because it was feeling to cause bad dreams. Since she stopped the medication her dreams I would not come back. However family is asking for advice. She's been on sertraline for a while and it caused improvement in her mood depression and anxiety. Family does not feel it is causing a significant side effect. She lives in assisted living type. She has recently given up her driver's license. If she needs help she can call for and they have also given her a life alert tight of apparatus to call for help if she falls and can get up. She apparently sleeps pretty well at night. Doesn't get lost in her local surroundings. She still is on low-dose prednisone to 1 mg a day per Dr. Dierdre Forth for PMR. Skin and lower extremities tends to be fairly thin but is stable at this time. She sees Dr. Alden Hipp the eye doctor no major changes. In the past she may have seen a neurologist and did not want to go back however family feels that reevaluation is in order. Asked to be referred to Dr. Anne Hahn. She has urinary nocturnal incontinence and on Myrbetriq per urology with some help. Appetite seems to be good and she eats heart healthy. No falling. ROS: See pertinent positives and negatives per HPI. No cp sob   Past Medical History:  Diagnosis Date  . Acute bronchitis 06/27/2015  . Cataract   . Cellulitis of left leg   . Depression   . Eczema   . History of renal stone    excision  . Hypertension   . Mild cognitive impairment   . Osteoarthritis   . Polymyalgia rheumatica (HCC)    rx with prednisone  . Urinary incontinence   . Varicose veins   . Xerosis of skin     Family History  Problem Relation Age of Onset  . Melanoma Mother     83  .  Breast cancer Mother     66  . Stroke Father     51  . Arthritis Father     89  . Osteoporosis Other     Social History   Social History  . Marital status: Widowed    Spouse name: N/A  . Number of children: N/A  . Years of education: N/A   Occupational History  . retired Runner, broadcasting/film/video    Social History Main Topics  . Smoking status: Former Smoker    Start date: 08/07/1944    Quit date: 10/13/1945  . Smokeless tobacco: Former Neurosurgeon    Quit date: 10/01/1945  . Alcohol use No  . Drug use: No  . Sexual activity: Not Asked   Other Topics Concern  . None   Social History Narrative   Widowed remote hx of tobacco x 4 years   [facility].   G4P3   Daughters have HCPOA  If needed      Diet: Regular      Do you drink/ eat things with caffeine? coffee      Marital status: Married                              What year  were you married ? Twice, 1st 1948, 2nd 1992      Do you live in a house, apartment,assistred living, condo, trailer, etc.)? Appartment      Is it one or more stories? No      How many persons live in your home ? 1      Do you have any pets in your home ?(please list) No      Current or past profession: Research officer, trade union       Do you exercise? Yes                             Type & how often: Walk daily      Do you have a living will? Yes      Do you have a DNR form? No                      If not, do you want to discuss one? Yes      Do you have signed POA?HPOA forms?  Yes               If so, please bring to your        appointment             Outpatient Medications Prior to Visit  Medication Sig Dispense Refill  . ALREX 0.2 % SUSP Instill 1 drop in both eyes twice daily as needed  6  . atenolol (TENORMIN) 50 MG tablet TAKE 1 TABLET BY MOUTH EVERY DAY 30 tablet 5  . Calcium Carb-Cholecalciferol (CALCIUM 600 + D PO) Take by mouth daily.    Marland Kitchen HYDROcodone-homatropine (HYCODAN) 5-1.5 MG/5ML syrup 1 tsp at night for cough   Can repeat in  4  hours as needed 120 mL 0  . mirabegron ER (MYRBETRIQ) 50 MG TB24 tablet Take 1 tablet (50 mg total) by mouth daily. 30 tablet 5  . Multiple Vitamin (MULTIVITAMIN WITH MINERALS) TABS Take 1 tablet by mouth every morning.    . potassium chloride (K-DUR) 10 MEQ tablet TAKE 2 TABLETS BY MOUTH EVERY DAY 60 tablet 1  . sertraline (ZOLOFT) 100 MG tablet Take 1 tablet (100 mg total) by mouth daily. 30 tablet 3  . albuterol (PROVENTIL HFA;VENTOLIN HFA) 108 (90 BASE) MCG/ACT inhaler Inhale 2 puffs into the lungs every 6 (six) hours as needed for wheezing or shortness of breath. 1 Inhaler 2  . donepezil (ARICEPT) 10 MG tablet TAKE ONE TABLET BY MOUTH NIGHTLY 90 tablet 0  . donepezil (ARICEPT) 10 MG tablet TAKE 1 TABLET BY MOUTH NIGHTLY 90 tablet 2  . doxycycline (VIBRA-TABS) 100 MG tablet Take 1 tablet (100 mg total) by mouth 2 (two) times daily. 14 tablet 0  . erythromycin ophthalmic ointment Place 1 application into the left eye 3 (three) times daily. Use a 1cm ribbon size of ointment 3.5 g 0  . fluticasone (FLONASE) 50 MCG/ACT nasal spray Place 1 spray into both nostrils daily. 16 g 0  . promethazine-dextromethorphan (PROMETHAZINE-DM) 6.25-15 MG/5ML syrup Take 5 mLs by mouth 4 (four) times daily as needed for cough. 118 mL 0   No facility-administered medications prior to visit.      EXAM:  BP (!) 148/80 (BP Location: Right Arm, Patient Position: Sitting, Cuff Size: Normal)   Temp 98.3 F (36.8 C) (Oral)   Ht 5' 3.5" (1.613 m)   Wt 162 lb 3.2 oz (73.6 kg)   BMI  28.28 kg/m   Body mass index is 28.28 kg/m.  GENERAL: vitals reviewed and listed above, alert,  Not oriented to date  Knows perons and plac e appears well hydrated and in no acute distress HEENT: atraumatic, conjunctiva  clear, no obvious abnormalities on inspection of external nose and ears hearing aids  OP : no lesion edema or exudate  NECK: no obvious masses on inspection palpation  LUNGS: clear to auscultation bilaterally, no  wheezes, rales or rhonchi, good air movement CV: HRRR, no clubbing cyanosis or  peripheral edema nl cap refill  MS: moves all extremities without noticeable focal  Abnormality Skin: Old chronic changes on the anterior shin but no acute bruising or lesions. Abdomen soft without organomegaly guarding or rebound. PSYCH: pleasant and cooperative,  loks to  Daughters for details of  Information.  Neuro balance seems normal and very good for age    ASSESSMENT AND PLAN:  Discussed the following assessment and plan:  Dementia, without behavioral disturbance - Plan: Basic metabolic panel, CBC with Differential/Platelet, TSH, Sedimentation rate, Lipid panel, VITAMIN D 25 Hydroxy (Vit-D Deficiency, Fractures), Vitamin B12, Ambulatory referral to Neurology  Essential hypertension, benign - Plan: Basic metabolic panel, CBC with Differential/Platelet, TSH, Sedimentation rate, Lipid panel, VITAMIN D 25 Hydroxy (Vit-D Deficiency, Fractures), Vitamin B12  Cognitive deficits - Plan: Basic metabolic panel, CBC with Differential/Platelet, TSH, Sedimentation rate, Lipid panel, VITAMIN D 25 Hydroxy (Vit-D Deficiency, Fractures), Vitamin B12, Ambulatory referral to Neurology  Medication management - Plan: Basic metabolic panel, CBC with Differential/Platelet, TSH, Sedimentation rate, Lipid panel, VITAMIN D 25 Hydroxy (Vit-D Deficiency, Fractures), Vitamin B12  On prednisone therapy - Plan: Basic metabolic panel, CBC with Differential/Platelet, TSH, Sedimentation rate, Lipid panel, VITAMIN D 25 Hydroxy (Vit-D Deficiency, Fractures), Vitamin B12  Major depression in complete remission [facility])  Age factor  Polymyalgia rheumatica (HCC) Due for labs  We'll check thyroid and B12. Will be contacted about neurology referral side effects of Aricept dictation to medications. Although unable to retrieve this information she may have been on Namenda in the past with a side effect. Would have to review the records to be  able to confirm that. If not she might tolerate. Discussed living situation ability to step up care if needed. -Patient advised to return or notify health care team  if symptoms worsen ,persist or new concerns arise.  Patient Instructions   Make sure  Get dr Dierdre Forth to send [place] copy of notes.   Will notify you  of labs when available.   Will do referral  To neurologist Dr Anne Hahn bout the  Memory issues .  But   The other medication that is used is Namenda and uncertain if  You  Have been on Namenda .       [name]. Idalie Canto M.D.  Lab Results  Component Value Date   WBC 9.4 03/20/2016   HGB 14.9 03/20/2016   HCT 44.9 03/20/2016   PLT 268.0 03/20/2016   GLUCOSE 84 03/20/2016   CHOL 245 (H) 03/20/2016   TRIG 116.0 03/20/2016   HDL 52.60 03/20/2016   LDLDIRECT 173.0 08/07/2012   LDLCALC 169 (H) 03/20/2016   ALT 21 01/21/2014   AST 27 01/21/2014   NA 137 03/20/2016   K 4.0 03/20/2016   CL 100 03/20/2016   CREATININE 0.56 03/20/2016   BUN 24 (H) 03/20/2016   CO2 32 03/20/2016   TSH 3.50 03/20/2016   INR 1.0 ratio 12/07/2008   Vit b12 and vit d in range

## 2016-04-06 ENCOUNTER — Ambulatory Visit (INDEPENDENT_AMBULATORY_CARE_PROVIDER_SITE_OTHER): Payer: Medicare Other | Admitting: Neurology

## 2016-04-06 ENCOUNTER — Encounter: Payer: Self-pay | Admitting: Neurology

## 2016-04-06 VITALS — BP 134/65 | HR 56 | Ht 63.5 in | Wt 161.5 lb

## 2016-04-06 DIAGNOSIS — F0280 Dementia in other diseases classified elsewhere without behavioral disturbance: Secondary | ICD-10-CM | POA: Diagnosis not present

## 2016-04-06 DIAGNOSIS — G301 Alzheimer's disease with late onset: Secondary | ICD-10-CM | POA: Diagnosis not present

## 2016-04-06 MED ORDER — RIVASTIGMINE TARTRATE 1.5 MG PO CAPS: ORAL_CAPSULE | ORAL | 1 refills | Status: DC

## 2016-04-06 NOTE — Progress Notes (Signed)
Reason for visit:  Memory disorder  Referring physician:  Dr. Luana Shu is a 82 y.o. female  History of present illness:   Ms. Coyne is an 82 year old right-handed white female with a history of problems with progressive memory disturbance for least 8 years. The patient was seen in 2010 by Dr. Sandria Manly, MRI of the brain was done at that time which did not show extensive small vessel changes. The patient has been treated with Aricept and Namenda in the past, she has not been able tolerate either of these medications. The Namenda resulted in significant dizziness, Aricept have resulted in vivid dreams. The patient has stopped the Aricept about 2 months ago. The patient currently lives at [facility], she is in independent living, but she does require assistance with paying her bills, she needs help keeping up with medications and appointments. She no longer operates a motor vehicle. The patient has fairly good balance, she will stumble on occasion, she has not had any recent falls. The patient has some issues with urinary symptoms with incontinence and urgency, she is followed by a urologist for this. She denies any numbness or weakness of the extremities. She denies any headache or significant dizziness problems. The patient has had relatively slow progression of memory over time. She is sent to this office for an evaluation.  Past Medical History:  Diagnosis Date  . Acute bronchitis 06/27/2015  . Cataract   . Cellulitis of left leg   . Depression   . Eczema   . High cholesterol   . History of renal stone    excision  . Hypertension   . Mild cognitive impairment   . Osteoarthritis   . Polymyalgia rheumatica (HCC)    rx with prednisone  . Urinary incontinence   . Varicose veins   . Xerosis of skin     Past Surgical History:  Procedure Laterality Date  . cataract surgery    . EYE SURGERY    . JOINT REPLACEMENT    . kidney stone surgey    . lipoma removal       Family History  Problem Relation Age of Onset  . Melanoma Mother     28  . Breast cancer Mother     48  . Stroke Father     56  . Arthritis Father     61  . Osteoporosis Other     Social history:  reports that she quit smoking about 70 years ago. She started smoking about 71 years ago. She quit smokeless tobacco use about 70 years ago. She reports that she does not drink alcohol or use drugs.  Medications:  Prior to Admission medications   Medication Sig Start Date End Date Taking? Authorizing Provider  ALREX 0.2 % SUSP Instill 1 drop in both eyes twice daily as needed 03/29/15  Yes Historical Provider, MD  Ascorbic Acid (VITAMIN C PO) Take by mouth.   Yes Historical Provider, MD  atenolol (TENORMIN) 50 MG tablet TAKE 1 TABLET BY MOUTH EVERY DAY 05/17/15  Yes Tiffany L Reed, DO  Calcium Carb-Cholecalciferol (CALCIUM 600 + D PO) Take by mouth daily.   Yes Historical Provider, MD  Cholecalciferol (VITAMIN D PO) Take by mouth.   Yes Historical Provider, MD  CRANBERRY PO Take by mouth.   Yes Historical Provider, MD  mirabegron ER (MYRBETRIQ) 50 MG TB24 tablet Take 1 tablet (50 mg total) by mouth daily. 12/28/15  Yes Tiffany Alroy Dust, DO  Multiple Vitamin (MULTIVITAMIN WITH MINERALS) TABS Take 1 tablet by mouth every morning.   Yes Historical Provider, MD  potassium chloride (K-DUR) 10 MEQ tablet TAKE 2 TABLETS BY MOUTH EVERY DAY 01/26/16  Yes Madelin HeadingsWanda K Panosh, MD  predniSONE (DELTASONE) 1 MG tablet Take 2 mg by mouth daily.   Yes Donnetta HailJames F Beekman, MD  Probiotic Product (PROBIOTIC PO) Take by mouth.   Yes Historical Provider, MD  sertraline (ZOLOFT) 100 MG tablet Take 1 tablet (100 mg total) by mouth daily. 01/25/16  Yes Madelin HeadingsWanda K Panosh, MD      Allergies  Allergen Reactions  . Donepezil Other (See Comments)    BAD DREAMS  . Remeron [Mirtazapine] Other (See Comments)    Dizzy     ROS:  Out of a complete 14 system review of symptoms, the patient complains only of the following  symptoms, and all other reviewed systems are negative.   Easy bruising  memory loss, confusion  anxiety, disinterest in activities  Blood pressure 134/65, pulse (!) 56, height 5' 3.5" (1.613 m), weight 161 lb 8 oz (73.3 kg).  Physical Exam  General: The patient is alert and cooperative at the time of the examination.  Eyes: Pupils are equal, round, and reactive to light. Discs are flat bilaterally.  Neck: The neck is supple, no carotid bruits are noted.  Respiratory: The respiratory examination is clear.  Cardiovascular: The cardiovascular examination reveals a regular rate and rhythm, no obvious murmurs or rubs are noted.  Skin: Extremities are without significant edema.  Neurologic Exam  Mental status: The patient is alert and oriented x 2 at the time of the examination ( Not oriented to date). The  Mini-Mental Status Examination done today shows a total score of 19/30.  Cranial nerves: Facial symmetry is present. There is good sensation of the face to pinprick and soft touch bilaterally. The strength of the facial muscles and the muscles to head turning and shoulder shrug are normal bilaterally. Speech is well enunciated, no aphasia or dysarthria is noted. Extraocular movements are full. Visual fields are full. The tongue is midline, and the patient has symmetric elevation of the soft palate. No obvious hearing deficits are noted.  Motor: The motor testing reveals 5 over 5 strength of all 4 extremities. Good symmetric motor tone is noted throughout.  Sensory: Sensory testing is intact to pinprick, soft touch, vibration sensation, and position sense on all 4 extremities. No evidence of extinction is noted.  Coordination: Cerebellar testing reveals good finger-nose-finger and heel-to-shin bilaterally.  Some apraxia with the use of the lower extremities was noted.  Gait and station: Gait is normal for age. Tandem gait is unsteady. Romberg is negative. No drift is seen.  Reflexes:  Deep tendon reflexes are symmetric, but are depressed bilaterally. Toes are downgoing bilaterally.   MRI brain 01/08/09:  IMPRESSION: No acute infarct.   Mild global age related atrophy without hydrocephalus.   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  Stability can be confirmed on follow-up.   Assessment/Plan:  1. Progressive memory disturbance  The patient has had very slow progression of memory over the last 8 years. The patient has not been able to tolerate Aricept or Namenda for memory. We will try low-dose Exelon to see if this can be better tolerated. If issues are noted, the family is to contact our office. We may try dietary supplements such as Vyacog if this is the case. The patient will otherwise follow-up in 6  months.  Marlan Palau MD 04/06/2016 11:41 AM  [facility] [address] [place], [place] [address]  Phone [phone] Fax [phone]

## 2016-04-19 ENCOUNTER — Telehealth: Payer: Self-pay

## 2016-04-19 NOTE — Telephone Encounter (Signed)
PA done on cover my meds for rivastigmine (EXELON) 1.5 MG capsule.

## 2016-04-25 ENCOUNTER — Other Ambulatory Visit: Payer: Self-pay | Admitting: Internal Medicine

## 2016-04-26 ENCOUNTER — Telehealth: Payer: Self-pay | Admitting: Internal Medicine

## 2016-04-26 MED ORDER — ATENOLOL 50 MG PO TABS: 50.0000 mg | ORAL_TABLET | Freq: Every day | ORAL | 1 refills | Status: DC

## 2016-04-26 NOTE — Telephone Encounter (Signed)
Pt has only one pill left of atenolol 50 mg. Pt needs  #90 w/refills [facility] to [facility] [address]

## 2016-04-26 NOTE — Telephone Encounter (Signed)
Sent to the pharmacy by e-scribe for 6 months.  Pt has upcoming appt on 09/20/16

## 2016-05-03 NOTE — Telephone Encounter (Signed)
[facility]

## 2016-05-03 NOTE — Telephone Encounter (Signed)
PA re-sent w/ change in quantity due to limit of 2 per day.

## 2016-05-04 NOTE — Telephone Encounter (Addendum)
[id number] approved 04/19/16-08/27/16. Call [facility] [phone] for additional questions.

## 2016-05-05 ENCOUNTER — Other Ambulatory Visit: Payer: Self-pay | Admitting: Internal Medicine

## 2016-05-08 NOTE — Telephone Encounter (Signed)
Sent to the pharmacy by e-scribe for 4 months.  Received a statement from the pharmacy stating the prescription was last filled on 05/05/16 and no other refills on file. Pt has upcoming appt on 09/20/16

## 2016-05-09 ENCOUNTER — Other Ambulatory Visit: Payer: Self-pay | Admitting: Internal Medicine

## 2016-05-11 NOTE — Telephone Encounter (Signed)
Ok to refill x 5  Previous provider was dr Joesphine Bareeed   Ok  For us to take over.

## 2016-05-19 NOTE — Telephone Encounter (Signed)
Sent to the pharmacy by e-scribe. 

## 2016-06-01 ENCOUNTER — Other Ambulatory Visit: Payer: Self-pay | Admitting: Internal Medicine

## 2016-06-22 ENCOUNTER — Other Ambulatory Visit: Payer: Self-pay | Admitting: Neurology

## 2016-07-15 ENCOUNTER — Other Ambulatory Visit: Payer: Self-pay | Admitting: Internal Medicine

## 2016-07-17 ENCOUNTER — Ambulatory Visit (INDEPENDENT_AMBULATORY_CARE_PROVIDER_SITE_OTHER): Payer: Medicare Other | Admitting: Family Medicine

## 2016-07-17 VITALS — BP 126/66 | HR 60 | Temp 97.8°F | Resp 18 | Ht 64.0 in | Wt 166.4 lb

## 2016-07-17 DIAGNOSIS — J301 Allergic rhinitis due to pollen: Secondary | ICD-10-CM

## 2016-07-17 MED ORDER — CETIRIZINE HCL 10 MG PO TABS: 10.0000 mg | ORAL_TABLET | Freq: Every day | ORAL | 11 refills | Status: DC

## 2016-07-17 MED ORDER — FLUTICASONE PROPIONATE 50 MCG/ACT NA SUSP: 2.0000 | Freq: Every day | NASAL | 11 refills | Status: DC

## 2016-07-17 NOTE — Progress Notes (Signed)
Chief Complaint  Patient presents with  . Emesis    to much drainage from sinuses x 2  this morning    HPI  Pt is here with her daughter Tricia Mendez She reports that her mother told her that she has felt nauseous from mucus draining into her throat. She reports that she is not blowing anything anything from the nose but it seems it was mostly from her sinuses draining down her throat.                          Past Medical History:  Diagnosis Date  . Acute bronchitis 06/27/2015  . Cataract   . Cellulitis of left leg   . Depression   . Eczema   . High cholesterol   . History of renal stone    excision  . Hypertension   . Mild cognitive impairment   . Osteoarthritis   . Polymyalgia rheumatica (HCC)    rx with prednisone  . Urinary incontinence   . Varicose veins   . Xerosis of skin     Current Outpatient Prescriptions  Medication Sig Dispense Refill  . Ascorbic Acid (VITAMIN C PO) Take by mouth.    Marland Kitchen. atenolol (TENORMIN) 50 MG tablet Take 1 tablet (50 mg total) by mouth daily. 90 tablet 1  . CRANBERRY PO Take by mouth.    . Multiple Vitamin (MULTIVITAMIN WITH MINERALS) TABS Take 1 tablet by mouth every morning.    Marland Kitchen. MYRBETRIQ 50 MG TB24 tablet TAKE 1 TABLET BY MOUTH EVERY DAY 30 tablet 4  . potassium chloride (K-DUR) 10 MEQ tablet TAKE 2 TABLETS BY MOUTH EVERY DAY 60 tablet 1  . predniSONE (DELTASONE) 1 MG tablet Take 2 mg by mouth daily.    . Probiotic Product (PROBIOTIC PO) Take by mouth.    . rivastigmine (EXELON) 1.5 MG capsule Take 2 capsules (3 mg total) by mouth 2 (two) times daily. 120 capsule 0  . sertraline (ZOLOFT) 100 MG tablet TAKE 1 TABLET BY MOUTH EVERY DAY 30 tablet 3  . ALREX 0.2 % SUSP Instill 1 drop in both eyes twice daily as needed  6  . cetirizine (ZYRTEC) 10 MG tablet Take 1 tablet (10 mg total) by mouth daily. 30 tablet 11  . fluticasone (FLONASE) 50 MCG/ACT nasal spray Place 2 sprays into both nostrils daily. 16 g 11   No current facility-administered  medications for this visit.     Allergies:  Allergies  Allergen Reactions  . Donepezil Other (See Comments)    BAD DREAMS  . Remeron [Mirtazapine] Other (See Comments)    Dizzy     Past Surgical History:  Procedure Laterality Date  . cataract surgery    . EYE SURGERY    . JOINT REPLACEMENT    . kidney stone surgey    . lipoma removal      Social History   Social History  . Marital status: Widowed    Spouse name: N/A  . Number of children: 3  . Years of education: 1 yr college   Occupational History  . Retired Runner, broadcasting/film/videoteacher    Social History Main Topics  . Smoking status: Former Smoker    Start date: 08/07/1944    Quit date: 10/13/1945  . Smokeless tobacco: Former NeurosurgeonUser    Quit date: 10/01/1945  . Alcohol use No  . Drug use: No  . Sexual activity: Not Asked   Other Topics Concern  . None  Social History Narrative   Widowed remote hx of tobacco x 4 years   [facility]   G[facility]   Daughters have HCPOA  If needed      Diet: Regular      Do you drink/ eat things with caffeine? Coffee in a.m.      Right-handed      Marital status: Married/Widowed                             What year were you married ? Twice, 1st 1948, 2nd 1992      Do you live in a house, apartment,assistred living, condo, trailer, etc.)? Apartment      Is it one or more stories? No      How many persons live in your home ? 1      Do you have any pets in your home ?(please list) No      Current or past profession: Research officer, trade unionTeachers Assistant       Do you exercise? Yes                             Type & how often: Walk daily      Do you have a living will? Yes      Do you have a DNR form? No                      If not, do you want to discuss one? Yes      Do you have signed POA?HPOA forms?  Yes               If so, please bring to your        appointment             ROS See hpi  Objective: Vitals:   07/17/16 1435  BP: 126/66  Pulse: 60  Resp: 18  Temp: 97.8 F (36.6 C)    TempSrc: Oral  SpO2: 95%  Weight: 166 lb 6.4 oz (75.5 kg)  Height: 5\' 4"  (1.626 m)    Physical Exam General: alert, oriented, in NAD Head: normocephalic, atraumatic, no sinus tenderness Eyes: EOM intact, no scleral icterus or conjunctival injection Ears: TM clear bilaterally Throat: no pharyngeal exudate or erythema Lymph: no posterior auricular, submental or cervical lymph adenopathy Heart: normal rate, normal sinus rhythm, no murmurs Lungs: clear to auscultation bilaterally, no wheezing   Assessment and Plan Tricia RhodesBetty was seen today for emesis.  Diagnoses and all orders for this visit:  Acute seasonal allergic rhinitis due to pollen  Advised pt to follow up if symptoms do not improve Advised to follow up in 1-2 weeks for re-evaluation  -     fluticasone (FLONASE) 50 MCG/ACT nasal spray; Place 2 sprays into both nostrils daily. -     cetirizine (ZYRTEC) 10 MG tablet; Take 1 tablet (10 mg total) by mouth daily.     Tricia Mendez A Tricia Mendez

## 2016-07-17 NOTE — Patient Instructions (Addendum)
   IF you received an x-ray today, you will receive an invoice from [facility]. Please contact [facility] at [phone] with questions or concerns regarding your invoice.   IF you received labwork today, you will receive an invoice from [facility]. Please contact [facility] at [phone] with questions or concerns regarding your invoice.   Our billing staff will not be able to assist you with questions regarding bills from these companies.  You will be contacted with the lab results as soon as they are available. The fastest way to get your results is to activate your My Chart account. Instructions are located on the last page of this paperwork. If you have not heard from us regarding the results in 2 weeks, please contact this office.     Allergic Rhinitis Allergic rhinitis is when the mucous membranes in the nose respond to allergens. Allergens are particles in the air that cause your body to have an allergic reaction. This causes you to release allergic antibodies. Through a chain of events, these eventually cause you to release histamine into the blood stream. Although meant to protect the body, it is this release of histamine that causes your discomfort, such as frequent sneezing, congestion, and an itchy, runny nose. What are the causes? Seasonal allergic rhinitis (hay fever) is caused by pollen allergens that may come from grasses, trees, and weeds. Year-round allergic rhinitis (perennial allergic rhinitis) is caused by allergens such as house dust mites, pet dander, and mold spores. What are the signs or symptoms?  Nasal stuffiness (congestion).  Itchy, runny nose with sneezing and tearing of the eyes. How is this diagnosed? Your health care provider can help you determine the allergen or allergens that trigger your symptoms. If you and your health care provider are unable to determine the allergen, skin or blood testing may be used.  Your health care provider will diagnose your condition after taking your health history and performing a physical exam. Your health care provider may assess you for other related conditions, such as asthma, pink eye, or an ear infection. How is this treated? Allergic rhinitis does not have a cure, but it can be controlled by:  Medicines that block allergy symptoms. These may include allergy shots, nasal sprays, and oral antihistamines.  Avoiding the allergen. Hay fever may often be treated with antihistamines in pill or nasal spray forms. Antihistamines block the effects of histamine. There are over-the-counter medicines that may help with nasal congestion and swelling around the eyes. Check with your health care provider before taking or giving this medicine. If avoiding the allergen or the medicine prescribed do not work, there are many new medicines your health care provider can prescribe. Stronger medicine may be used if initial measures are ineffective. Desensitizing injections can be used if medicine and avoidance does not work. Desensitization is when a patient is given ongoing shots until the body becomes less sensitive to the allergen. Make sure you follow up with your health care provider if problems continue. Follow these instructions at home: It is not possible to completely avoid allergens, but you can reduce your symptoms by taking steps to limit your exposure to them. It helps to know exactly what you are allergic to so that you can avoid your specific triggers. Contact a health care provider if:  You have a fever.  You develop a cough that does not stop easily (persistent).  You have shortness of breath.  You start wheezing.  Symptoms interfere with   normal daily activities. This information is not intended to replace advice given to you by your health care provider. Make sure you discuss any questions you have with your health care provider. Document Released: 05/09/2001  Document Revised: 04/14/2016 Document Reviewed: 04/21/2013 Elsevier Interactive Patient Education  2017 Elsevier Inc.  

## 2016-07-17 NOTE — Telephone Encounter (Signed)
Last filled in May for 2 months.  Concerned that pt was no longer taking medication.  Called Darl PikesSusan (daughter) and she stated that the pt had so much medication at home that refills were not required until now.  Stated pt has only been taking 1 pill since [facility].  Cannot find that in the record.  [facility] to get authorization to change directions and send to the pharmacy.

## 2016-07-18 ENCOUNTER — Other Ambulatory Visit: Payer: Self-pay | Admitting: Family Medicine

## 2016-07-18 DIAGNOSIS — E876 Hypokalemia: Secondary | ICD-10-CM

## 2016-07-18 NOTE — Telephone Encounter (Signed)
Spoke to [place].  Pt is no longer taking diuretic.  Instructed to stop potassium until BMP in one month.  Darl PikesSusan will call back to schedule lab appt.

## 2016-07-18 NOTE — Telephone Encounter (Signed)
  Confirm that she is not on a diuretic ( I beleive stopped in 2016 by Dr Renato Gailseed )   If she is not on a diuretic we can  try off of  The potassium and then  Recheck the BMP  in a month    If the potassium goes level drops we can reinstitute   Lab Results  Component Value Date   WBC 9.4 03/20/2016   HGB 14.9 03/20/2016   HCT 44.9 03/20/2016   PLT 268.0 03/20/2016   GLUCOSE 84 03/20/2016   CHOL 245 (H) 03/20/2016   TRIG 116.0 03/20/2016   HDL 52.60 03/20/2016   LDLDIRECT 173.0 08/07/2012   LDLCALC 169 (H) 03/20/2016   ALT 21 01/21/2014   AST 27 01/21/2014   NA 137 03/20/2016   K 4.0 03/20/2016   CL 100 03/20/2016   CREATININE 0.56 03/20/2016   BUN 24 (H) 03/20/2016   CO2 32 03/20/2016   TSH 3.50 03/20/2016   INR 1.0 ratio 12/07/2008

## 2016-07-24 ENCOUNTER — Other Ambulatory Visit: Payer: Self-pay | Admitting: Internal Medicine

## 2016-07-25 ENCOUNTER — Encounter: Payer: Self-pay | Admitting: Internal Medicine

## 2016-07-25 DIAGNOSIS — F0280 Dementia in other diseases classified elsewhere without behavioral disturbance: Secondary | ICD-10-CM | POA: Insufficient documentation

## 2016-07-25 DIAGNOSIS — G301 Alzheimer's disease with late onset: Secondary | ICD-10-CM

## 2016-07-26 NOTE — Telephone Encounter (Signed)
Filled on 07/24/16 by the pharmacy.  Pt has upcoming appt on 09/21/15.  Will refill at appt.  Message sent to the pharmacy.

## 2016-07-31 ENCOUNTER — Telehealth: Payer: Self-pay | Admitting: Internal Medicine

## 2016-07-31 NOTE — Telephone Encounter (Signed)
Will [facility] to [facility] as FYI

## 2016-07-31 NOTE — Telephone Encounter (Signed)
Spoke to Temple-InlandSusan Mendez about the do not resuscitate order.  Explained that Dr. Fabian SharpPanosh will usually discuss with the patient while in an office visit.  Also explained that I need to make sure that she understands what the order means.  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  I went on to get a final confirmation while explaining that no cpr measures will be given to the pt in the event that Tricia Mendez's breathing or heart would stop.  Tricia Mendez again stated that she understands the order and went on to say that this is what the family wants along with the pt.  Informed Tricia Mendez that I will have Dr. Fabian SharpPanosh sign paper work and will then have [facility] pick up the orders.

## 2016-07-31 NOTE — Telephone Encounter (Signed)
Pt daughter is returning misty call °

## 2016-07-31 NOTE — Telephone Encounter (Signed)
Noted. And agree with plan.

## 2016-08-03 ENCOUNTER — Telehealth: Payer: Self-pay | Admitting: Internal Medicine

## 2016-08-03 NOTE — Telephone Encounter (Signed)
error 

## 2016-09-08 ENCOUNTER — Telehealth: Payer: Self-pay | Admitting: Internal Medicine

## 2016-09-08 NOTE — Telephone Encounter (Signed)
Tricia Mendez with [facility] sent over an order 3 weeks concerning pt's rivastigmine (EXELON) 1.5 MG capsule  Pt refuses to take this med at night, stating it gives her nightmares.  They want to know what to do about this, OK to just give 2 tabs in the am, or 4 in the am? Tricia Mendez states she has called several times along with fax 3 weeks ago.  Fax  [phone]

## 2016-09-08 NOTE — Telephone Encounter (Signed)
Spoke to [place] and advised that Dr. Demetrius CharityP is not the pre scriber of Exelon.  Dr. Anne HahnWillis is pre scriber.  [name] will contact his office.

## 2016-09-12 ENCOUNTER — Telehealth: Payer: Self-pay | Admitting: Neurology

## 2016-09-12 NOTE — Telephone Encounter (Signed)
I called the nurses station, I left a message. I will send an order to discontinue the evening dose of Exelon.

## 2016-09-12 NOTE — Telephone Encounter (Signed)
[name] with [facility] called in reference to patients rivastigmine (EXELON) 1.5 MG capsule.  States patient is taking the correct dosage in the Am, but refuses to take in the evening due to giving patient nightmares.  [name] is wanting to know if they are able to discontinue at night.  Fax #- [phone]. Please call

## 2016-09-20 ENCOUNTER — Ambulatory Visit: Payer: Medicare Other | Admitting: Internal Medicine

## 2016-10-12 ENCOUNTER — Ambulatory Visit (INDEPENDENT_AMBULATORY_CARE_PROVIDER_SITE_OTHER): Payer: Medicare Other | Admitting: Neurology

## 2016-10-12 ENCOUNTER — Encounter: Payer: Self-pay | Admitting: Neurology

## 2016-10-12 VITALS — BP 140/72 | HR 54 | Ht 64.0 in | Wt 169.0 lb

## 2016-10-12 DIAGNOSIS — G301 Alzheimer's disease with late onset: Secondary | ICD-10-CM

## 2016-10-12 DIAGNOSIS — F0280 Dementia in other diseases classified elsewhere without behavioral disturbance: Secondary | ICD-10-CM | POA: Diagnosis not present

## 2016-10-12 MED ORDER — PHOSPHATIDYLSERINE-DHA-EPA 100-19.5-6.5 MG PO CAPS: 1.0000 | ORAL_CAPSULE | Freq: Every day | ORAL | 1 refills | Status: DC

## 2016-10-12 NOTE — Patient Instructions (Signed)
   We will start Vyacog for the memory.

## 2016-10-12 NOTE — Progress Notes (Signed)
Reason for visit: Memory disturbance  Tricia Mendez is an 83 y.o. female  History of present illness:  Tricia Mendez is a 83 year old right-handed white female with a history of a progressive memory disturbance consistent with Alzheimer's disease. The patient continues to progress, she is having increasing problems with word finding and difficulty with reading. The patient remains active, she likes to walk quite a bit, she may be somewhat agitated at times, the patient is on Zoloft taking 100 mg daily. The patient has not been able to tolerate Namenda or Aricept, she was switched to low-dose Exelon capsules, she can only take the capsule during the morning as the evening dose results in vivid dreams. The patient is not operating a motor vehicle, she lives in an extended care facility, she recently has moved from independent living to assisted living. She returns this office for an evaluation.  Past Medical History:  Diagnosis Date  . Acute bronchitis 06/27/2015  . Cataract   . Cellulitis of left leg   . Depression   . Eczema   . High cholesterol   . History of renal stone    excision  . Hypertension   . Mild cognitive impairment   . Osteoarthritis   . Polymyalgia rheumatica (HCC)    rx with prednisone  . Urinary incontinence   . Varicose veins   . Xerosis of skin     Past Surgical History:  Procedure Laterality Date  . cataract surgery    . EYE SURGERY    . JOINT REPLACEMENT    . kidney stone surgey    . lipoma removal      Family History  Problem Relation Age of Onset  . Melanoma Mother     [id number]  . Breast cancer Mother     [id number]  . Stroke Father     [id number]  . Arthritis Father     [id number]  . Osteoporosis Other     Social history:  reports that she quit smoking about 71 years ago. She started smoking about 72 years ago. She quit smokeless tobacco use about 71 years ago. She reports that she does not drink alcohol or use drugs.    Allergies  Allergen Reactions  .  Donepezil Other (See Comments)    BAD DREAMS  . Remeron [Mirtazapine] Other (See Comments)    Dizzy     Medications:  Prior to Admission medications   Medication Sig Start Date End Date Taking? Authorizing Provider  ALREX 0.2 % SUSP Instill 1 drop in both eyes twice daily as needed 03/29/15  Yes Historical Provider, MD  Ascorbic Acid (VITAMIN C PO) Take by mouth.   Yes Historical Provider, MD  atenolol (TENORMIN) 50 MG tablet Take 1 tablet (50 mg total) by mouth daily. 04/26/16  Yes Madelin HeadingsWanda K Panosh, MD  CRANBERRY PO Take by mouth.   Yes Historical Provider, MD  fluticasone (FLONASE) 50 MCG/ACT nasal spray Place 2 sprays into both nostrils daily. 07/17/16  Yes Doristine BosworthZoe A Stallings, MD  Multiple Vitamin (MULTIVITAMIN WITH MINERALS) TABS Take 1 tablet by mouth every morning.   Yes Historical Provider, MD  predniSONE (DELTASONE) 1 MG tablet Take 2 mg by mouth daily.   Yes Donnetta HailJames F Beekman, MD  Probiotic Product (PROBIOTIC PO) Take by mouth.   Yes Historical Provider, MD  rivastigmine (EXELON) 1.5 MG capsule Take 2 capsules (3 mg total) by mouth 2 (two) times daily. 06/22/16  Yes York Spanielharles K Danikah Budzik, MD  sertraline (ZOLOFT) 100  MG tablet TAKE 1 TABLET BY MOUTH EVERY DAY 05/08/16  Yes Madelin Headings, MD  Phosphatidylserine-DHA-EPA (VAYACOG) 100-19.5-6.5 MG CAPS Take 1 tablet by mouth daily. 10/12/16   York Spaniel, MD    ROS:  Out of a complete 14 system review of symptoms, the patient complains only of the following symptoms, and all other reviewed systems are negative.  Incontinence of the bladder, frequency of urination, urinary urgency Joint pain, walking difficulty Bruising easily Memory loss, speech difficulty Confusion, depression, anxiety  Blood pressure 140/72, pulse (!) 54, height 5\' 4"  (1.626 m), weight 169 lb (76.7 kg).  Physical Exam  General: The patient is alert and cooperative at the time of the examination.  Skin: No significant peripheral edema is noted.   Neurologic  Exam  Mental status: The patient is alert and oriented x 2 at the time of the examination (not oriented to date). The Mini-Mental Status Examination done today shows a total score of 11/30.   Cranial nerves: Facial symmetry is present. Speech is normal, no aphasia or dysarthria is noted. Extraocular movements are full. Visual fields are full.  Motor: The patient has good strength in all 4 extremities.  Sensory examination: Soft touch sensation is symmetric on the face, arms, and legs.  Coordination: The patient has good finger-nose-finger bilaterally. The patient has apraxia with the use of the lower extremities, unable to perform heel-to-shin on either side.  Gait and station: The patient has a normal gait. Tandem gait is slightly unsteady. Romberg is negative. No drift is seen.  Reflexes: Deep tendon reflexes are symmetric.   Assessment/Plan:  1. Progressive memory disturbance, Alzheimer's disease  The patient continues to progress with the memory. She is having some agitation at times, the Zoloft can be increased if needed in the future. The patient will be given a trial on Vyacog, a prescription was written. The patient will follow-up through this office in 6 months.  Marlan Palau MD 10/12/2016 11:33 AM  [facility] [address] [place], [place] [address]  Phone [phone] Fax [phone]

## 2017-02-14 ENCOUNTER — Encounter: Payer: Self-pay | Admitting: Internal Medicine

## 2017-02-14 ENCOUNTER — Ambulatory Visit (INDEPENDENT_AMBULATORY_CARE_PROVIDER_SITE_OTHER): Payer: Medicare Other

## 2017-02-14 ENCOUNTER — Ambulatory Visit (INDEPENDENT_AMBULATORY_CARE_PROVIDER_SITE_OTHER): Payer: Medicare Other | Admitting: Internal Medicine

## 2017-02-14 VITALS — BP 132/70 | HR 58 | Temp 98.6°F | Wt 174.0 lb

## 2017-02-14 DIAGNOSIS — F0390 Unspecified dementia without behavioral disturbance: Secondary | ICD-10-CM

## 2017-02-14 DIAGNOSIS — M7989 Other specified soft tissue disorders: Secondary | ICD-10-CM

## 2017-02-14 DIAGNOSIS — M353 Polymyalgia rheumatica: Secondary | ICD-10-CM | POA: Diagnosis not present

## 2017-02-14 MED ORDER — DOXYCYCLINE HYCLATE 100 MG PO TABS: 100.0000 mg | ORAL_TABLET | Freq: Two times a day (BID) | ORAL | 0 refills | Status: DC

## 2017-02-14 MED ORDER — COLCHICINE 0.6 MG PO TABS: ORAL_TABLET | ORAL | 1 refills | Status: DC

## 2017-02-14 NOTE — Progress Notes (Signed)
Chief Complaint  Patient presents with  . Joint Swelling    pinky is swollen    HPI: Tricia Mendez 82 y.o. sda  Comes in today with daughter for acutely swollen red and painful pinky finger  And stiffenss   without fever known trauma   Onset ? 1 day ?    Never had before  .  No abrasion but memory not good for patient.  Has pmr  No hx of gout?  no rx no new meds   No fever chills  ROS: See pertinent positives and negatives per HPI. [facility] .  She is on myrbetric  Past Medical History:  Diagnosis Date  . Acute bronchitis 06/27/2015  . Cataract   . Cellulitis of left leg   . Depression   . Eczema   . High cholesterol   . History of renal stone    excision  . Hypertension   . Mild cognitive impairment   . Osteoarthritis   . Polymyalgia rheumatica (HCC)    rx with prednisone  . Urinary incontinence   . Varicose veins   . Xerosis of skin     Family History  Problem Relation Age of Onset  . Melanoma Mother        14  . Breast cancer Mother        15  . Stroke Father        53  . Arthritis Father        24  . Osteoporosis Other     Social History   Social History  . Marital status: Widowed    Spouse name: N/A  . Number of children: 3  . Years of education: 1 yr college   Occupational History  . Retired Runner, broadcasting/film/video    Social History Main Topics  . Smoking status: Former Smoker    Start date: 08/07/1944    Quit date: 10/13/1945  . Smokeless tobacco: Former Neurosurgeon    Quit date: 10/01/1945  . Alcohol use No  . Drug use: No  . Sexual activity: Not Asked   Other Topics Concern  . None   Social History Narrative   Widowed remote hx of tobacco x 4 years   [facility] at [facility] [place]   G4P3   Daughters have HCPOA  If needed      Diet: Regular      Do you drink/ eat things with caffeine? Coffee in a.m.      Right-handed      Marital status: Married/Widowed                             What year were you married ?  Twice, 1st 1948, 2nd 1992      Do you live in a house, apartment,assistred living, condo, trailer, etc.)? Apartment      Is it one or more stories? No      How many persons live in your home ? 1      Do you have any pets in your home ?(please list) No      Current or past profession: Research officer, trade union       Do you exercise? Yes                             Type & how often: Walk daily      Do you have  a living will? Yes      Do you have a DNR form? No                      If not, do you want to discuss one? Yes      Do you have signed POA?HPOA forms?  Yes               If so, please bring to your        appointment                EXAM:  BP 132/70 (BP Location: Right Arm, Patient Position: Sitting, Cuff Size: Normal)   Pulse (!) 58   Temp 98.6 F (37 C) (Oral)   Wt 174 lb (78.9 kg)   BMI 29.87 kg/m   Body mass index is 29.87 kg/m.  GENERAL: vitals reviewed and listed above, alert, oriented to person not time , appears well hydrated and in no acute distress HEENT: atraumatic, conjunctiva  clear, no obvious abnormalities on inspection of external nose and ears hands  right pinky and  mcp area reddened warm and tender mostly at the mcp but dec rom of whole finger fron swelling 2-3+ rest of hand and other is normal  NV seems intact  PSYCH: pleasant and cooperative, gait normal   Lab Results  Component Value Date   WBC 9.4 03/20/2016   HGB 14.9 03/20/2016   HCT 44.9 03/20/2016   PLT 268.0 03/20/2016   GLUCOSE 84 03/20/2016   CHOL 245 (H) 03/20/2016   TRIG 116.0 03/20/2016   HDL 52.60 03/20/2016   LDLDIRECT 173.0 08/07/2012   LDLCALC 169 (H) 03/20/2016   ALT 21 01/21/2014   AST 27 01/21/2014   NA 137 03/20/2016   K 4.0 03/20/2016   CL 100 03/20/2016   CREATININE 0.56 03/20/2016   BUN 24 (H) 03/20/2016   CO2 32 03/20/2016   TSH 3.50 03/20/2016   INR 1.0 ratio 12/07/2008     This example would display results for the last three orders with an external  procedure ID of [id number].  ASSESSMENT AND PLAN:  Discussed the following assessment and plan:  Swelling of right hand - Plan: DG Hand Complete Right  Finger swelling - Plan: DG Hand Complete Right  Polymyalgia rheumatica (HCC)  Dementia without behavioral disturbance, unspecified dementia type Uncertain cause   Gout vx infection but done see break in skin    Less likely l trauma without bruising  . Get x ray  Empiric rx colchicine and antibiotic  . Already on low dose pred  For pmr and   Caution with nsaids  At this time     Expectant management. And fu  Last renal funcntion ok      Expectant management. Fu 2 days ov or call   if not a lot better or as needed  -Patient advised to return or notify health care team  if symptoms worsen ,persist or new concerns arise.  Patient Instructions  Get x ray    Treating for possible gout and possible infection   If not improved in next 48 hours contact us for recheck or  If worse fever etc   .     [name] M.D.

## 2017-02-14 NOTE — Patient Instructions (Signed)
Get x ray    Treating for possible gout and possible infection   If not improved in next 48 hours contact us for recheck or  If worse fever etc   .

## 2017-04-06 ENCOUNTER — Telehealth: Payer: Self-pay | Admitting: Neurology

## 2017-04-06 NOTE — Telephone Encounter (Signed)
The family has requested that the patient come off of Vayacog and the Exelon, have no problem with this, the patient wishes to go on fish oil 1200 mg daily.  The patient is getting towards end-stage with her dementia, okay to come off of these medications. She has not been able to tolerate Aricept or Namenda previously.

## 2017-04-09 ENCOUNTER — Telehealth: Payer: Self-pay | Admitting: *Deleted

## 2017-04-09 NOTE — Telephone Encounter (Signed)
Faxed signed [facility] to d/c vacog and rivastigime, begin fish oil 1200mg  po daily per [facility]. Fax: [phone]. Received confirmation.

## 2017-04-12 ENCOUNTER — Telehealth: Payer: Self-pay | Admitting: Internal Medicine

## 2017-04-12 NOTE — Telephone Encounter (Signed)
[facility] is calling needing the form that was faxed over on 04/03/17 to be faxed back to  [phone]

## 2017-04-19 NOTE — Telephone Encounter (Signed)
Spoke with Rosey Bath from [facility] and she is going to refax the document that Dr. Fabian Sharp needs to sign.

## 2017-04-22 NOTE — Progress Notes (Deleted)
No chief complaint on file.   HPI: Tricia Mendez 83 y.o. come in for acute problem   Neuro given dementia meds  Stopping rivastigmine and now fish oil rx  As of 8 13  ROS: See pertinent positives and negatives per HPI.  Past Medical History:  Diagnosis Date  . Acute bronchitis 06/27/2015  . Cataract   . Cellulitis of left leg   . Depression   . Eczema   . High cholesterol   . History of renal stone    excision  . Hypertension   . Mild cognitive impairment   . Osteoarthritis   . Polymyalgia rheumatica (HCC)    rx with prednisone  . Urinary incontinence   . Varicose veins   . Xerosis of skin     Family History  Problem Relation Age of Onset  . Melanoma Mother        60  . Breast cancer Mother        107  . Stroke Father        30  . Arthritis Father        63  . Osteoporosis Other     Social History   Social History  . Marital status: Widowed    Spouse name: N/A  . Number of children: 3  . Years of education: 1 yr college   Occupational History  . Retired Runner, broadcasting/film/video    Social History Main Topics  . Smoking status: Former Smoker    Start date: 08/07/1944    Quit date: 10/13/1945  . Smokeless tobacco: Former Neurosurgeon    Quit date: 10/01/1945  . Alcohol use No  . Drug use: No  . Sexual activity: Not on file   Other Topics Concern  . Not on file   Social History Narrative   Widowed remote hx of tobacco x 4 years   Living at [facility], [place]   [id number]   Daughters have HCPOA  If needed      Diet: Regular      Do you drink/ eat things with caffeine? Coffee in a.m.      Right-handed      Marital status: Married/Widowed                             What year were you married ? Twice, 1st 1948, 2nd 1992      Do you live in a house, apartment,assistred living, condo, trailer, etc.)? Apartment      Is it one or more stories? No      How many persons live in your home ? 1      Do you have any pets in your home ?(please list) No      Current  or past profession: Research officer, trade union       Do you exercise? Yes                             Type & how often: Walk daily      Do you have a living will? Yes      Do you have a DNR form? No                      If not, do you want to discuss one? Yes      Do you have signed POA?HPOA forms?  Yes  If so, please bring to your        appointment             Outpatient Medications Prior to Visit  Medication Sig Dispense Refill  . ALREX 0.2 % SUSP Instill 1 drop in both eyes twice daily as needed  6  . Ascorbic Acid (VITAMIN C PO) Take by mouth.    Marland Kitchen atenolol (TENORMIN) 50 MG tablet Take 1 tablet (50 mg total) by mouth daily. 90 tablet 1  . colchicine 0.6 MG tablet 1.2 mg ORALLY ax 1  And can repeat  0.6 mg one hr later; MAX 1.8 mg then 1 qd for 2 weeks 30 tablet 1  . CRANBERRY PO Take by mouth.    . doxycycline (VIBRA-TABS) 100 MG tablet Take 1 tablet (100 mg total) by mouth 2 (two) times daily. 14 tablet 0  . fluticasone (FLONASE) 50 MCG/ACT nasal spray Place 2 sprays into both nostrils daily. 16 g 11  . Multiple Vitamin (MULTIVITAMIN WITH MINERALS) TABS Take 1 tablet by mouth every morning.    . Phosphatidylserine-DHA-EPA (VAYACOG) 100-19.5-6.5 MG CAPS Take 1 tablet by mouth daily. 90 capsule 1  . predniSONE (DELTASONE) 1 MG tablet Take 2 mg by mouth daily.    . Probiotic Product (PROBIOTIC PO) Take by mouth.    . rivastigmine (EXELON) 1.5 MG capsule Take 2 capsules (3 mg total) by mouth 2 (two) times daily. 120 capsule 0  . sertraline (ZOLOFT) 100 MG tablet TAKE 1 TABLET BY MOUTH EVERY DAY 30 tablet 3   No facility-administered medications prior to visit.      EXAM:  There were no vitals taken for this visit.  There is no height or weight on file to calculate BMI.  GENERAL: vitals reviewed and listed above, alert, oriented, appears well hydrated and in no acute distress HEENT: atraumatic, conjunctiva  clear, no obvious abnormalities on inspection of external  nose and ears OP : no lesion edema or exudate  NECK: no obvious masses on inspection palpation  LUNGS: clear to auscultation bilaterally, no wheezes, rales or rhonchi, good air movement CV: HRRR, no clubbing cyanosis or  peripheral edema nl cap refill  MS: moves all extremities without noticeable focal  abnormality PSYCH: pleasant and cooperative, no obvious depression or anxiety Lab Results  Component Value Date   WBC 9.4 03/20/2016   HGB 14.9 03/20/2016   HCT 44.9 03/20/2016   PLT 268.0 03/20/2016   GLUCOSE 84 03/20/2016   CHOL 245 (H) 03/20/2016   TRIG 116.0 03/20/2016   HDL 52.60 03/20/2016   LDLDIRECT 173.0 08/07/2012   LDLCALC 169 (H) 03/20/2016   ALT 21 01/21/2014   AST 27 01/21/2014   NA 137 03/20/2016   K 4.0 03/20/2016   CL 100 03/20/2016   CREATININE 0.56 03/20/2016   BUN 24 (H) 03/20/2016   CO2 32 03/20/2016   TSH 3.50 03/20/2016   INR 1.0 ratio 12/07/2008   BP Readings from Last 3 Encounters:  02/14/17 132/70  10/12/16 140/72  07/17/16 126/66    ASSESSMENT AND PLAN:  Discussed the following assessment and plan:  Dementia without behavioral disturbance, unspecified dementia type  Polymyalgia rheumatica (HCC)  Essential hypertension, benign  Medication management  Major depression in complete remission (HCC) ? Sodium level? R/o metabolic dec meds  -Patient advised to return or notify health care team  if  new concerns arise.  There are no Patient Instructions on file for this visit.   Tricia Mendez. Tricia Mendez M.D.

## 2017-04-23 ENCOUNTER — Encounter: Payer: Self-pay | Admitting: *Deleted

## 2017-04-23 ENCOUNTER — Ambulatory Visit: Payer: Medicare Other | Admitting: Internal Medicine

## 2017-04-25 ENCOUNTER — Non-Acute Institutional Stay: Payer: Medicare Other | Admitting: Nurse Practitioner

## 2017-04-25 DIAGNOSIS — M353 Polymyalgia rheumatica: Secondary | ICD-10-CM

## 2017-04-25 DIAGNOSIS — F0280 Dementia in other diseases classified elsewhere without behavioral disturbance: Secondary | ICD-10-CM | POA: Diagnosis not present

## 2017-04-25 DIAGNOSIS — L819 Disorder of pigmentation, unspecified: Secondary | ICD-10-CM | POA: Diagnosis not present

## 2017-04-25 DIAGNOSIS — Z7189 Other specified counseling: Secondary | ICD-10-CM

## 2017-04-25 DIAGNOSIS — L299 Pruritus, unspecified: Secondary | ICD-10-CM

## 2017-04-25 DIAGNOSIS — Z87898 Personal history of other specified conditions: Secondary | ICD-10-CM | POA: Diagnosis not present

## 2017-04-25 DIAGNOSIS — F321 Major depressive disorder, single episode, moderate: Secondary | ICD-10-CM

## 2017-04-25 DIAGNOSIS — I10 Essential (primary) hypertension: Secondary | ICD-10-CM | POA: Diagnosis not present

## 2017-04-25 DIAGNOSIS — G301 Alzheimer's disease with late onset: Secondary | ICD-10-CM

## 2017-04-25 NOTE — Assessment & Plan Note (Addendum)
Blood pressure is controlled, continue Atenolol 50mg  daily, update CMP, lipid panel.

## 2017-04-25 NOTE — Assessment & Plan Note (Addendum)
Resolved, denied chest pain, palpitation, SOB, change of vision, headache associated with the event, will obtain EKG, observe

## 2017-04-25 NOTE — Assessment & Plan Note (Signed)
In her chest, many raised skin colored moles, c/o itching, no rash, will apply 1% hydrocortisone cream qhs until healed for symptomatic management. Observe.

## 2017-04-25 NOTE — Progress Notes (Addendum)
Location:   [facility] Room Number: 801 Place of Service: [facility] Provider:  Chipper Oman NP  [facility]  Patient Care Team: [facility] as PCP - General (Skilled Nursing and Assisted Living Facility) Dierdre Forth, Rhona Leavens, MD (Rheumatology) Ernesto Rutherford, MD (Ophthalmology) Nita Sells, MD (Dermatology) Crist Fat, MD as Attending Physician (Urology)  Extended Emergency Contact Information Primary Emergency Contact: McClanahan,Susan Address: [address] [place], [place] [address] [place] Home Phone: [phone] Mobile Phone: [phone] Relation: Daughter Secondary Emergency Contact: Gardiner Sleeper [place] of [place] Home Phone: [phone] Relation: Daughter  Code Status:  DNR Goals of care: Advanced Directive information Advanced Directives 04/27/2017  Does Patient Have a Medical Advance Directive? No  Type of Advance Directive -  Does patient want to make changes to medical advance directive? -  Copy of Healthcare Power of Attorney in Chart? -  Would patient like information on creating a medical advance directive? No - Patient declined     Chief Complaint  Patient presents with  . Establish Care    HPI:  Pt is a 83 y.o. female seen today for a new patient establishment to [employer].  The patient was admitted to [facility] for higher level of since 06/2016 due to her memory loss, she is off Exelon recently because of POA didn't think it made any difference in her cognition. POA daughter stated she didn't tolerate Namenda-nightmares, Aricept-GI symptoms. Her mood is stable, sleeps well and eats well, taking Sertraline 100mg  daily. Hx of HTN, blood pressure is controlled, taking Atenolol 50mg  daily. She takes 3mg  Prednisone daily for Polymyalgia Rheumatica, f/u Rheumatology regularly. Urinary leakage, off Myrbetriq-very little efficacy per POA, uses pads all the time, she does  have some urinary awareness, urinates in commode some times. There is resolved dizziness and elevated blood pressure for a few day sometimes back without focal weakness or change of mentation. Her daughter presented during my visit, wants to discuss the patient's goal of care   Past Medical History:  Diagnosis Date  . Acute bronchitis 06/27/2015  . Alzheimer's dementia   . Cataract   . Cellulitis of left leg   . Depression   . Eczema   . High cholesterol   . History of renal stone    excision  . Hypertension   . Mild cognitive impairment   . Osteoarthritis   . Polymyalgia rheumatica (HCC)    rx with prednisone  . Urinary incontinence   . Varicose veins   . Xerosis of skin    Past Surgical History:  Procedure Laterality Date  . cataract surgery    . EYE SURGERY    . JOINT REPLACEMENT Bilateral    Dr. Trudee Grip  . kidney stone surgey    . lipoma removal      Allergies  Allergen Reactions  . Donepezil Other (See Comments)    BAD DREAMS  . Remeron [Mirtazapine] Other (See Comments)    Dizzy     Allergies as of 04/25/2017      Reactions   Donepezil Other (See Comments)   BAD DREAMS   Remeron [mirtazapine] Other (See Comments)   Dizzy       Medication List       Accurate as of 04/25/17 11:59 PM. Always use your most recent med list.          ALREX 0.2 % Susp Generic drug:  loteprednol Instill 1 drop in both eyes twice daily as needed   atenolol 50 MG tablet Commonly known as:  TENORMIN Take 1 tablet (50 mg total) by mouth daily.   colchicine 0.6 MG tablet 1.2 mg ORALLY ax 1  And can repeat  0.6 mg one hr later; MAX 1.8 mg then 1 qd for 2 weeks   CRANBERRY PO Take by mouth.   doxycycline 100 MG tablet Commonly known as:  VIBRA-TABS Take 1 tablet (100 mg total) by mouth 2 (two) times daily.   fluticasone 50 MCG/ACT nasal spray Commonly known as:  FLONASE Place 2 sprays into both nostrils daily.   multivitamin with minerals Tabs tablet Take 1  tablet by mouth every morning.   Phosphatidylserine-DHA-EPA 100-19.5-6.5 MG Caps Commonly known as:  VAYACOG Take 1 tablet by mouth daily.   potassium chloride 10 MEQ tablet Commonly known as:  K-DUR,KLOR-CON Take 10 mEq by mouth daily.   predniSONE 1 MG tablet Commonly known as:  DELTASONE Take 3 mg by mouth daily.   PROBIOTIC PO Take 1 capsule by mouth daily.   rivastigmine 1.5 MG capsule Commonly known as:  EXELON Take 2 capsules (3 mg total) by mouth 2 (two) times daily.   sertraline 100 MG tablet Commonly known as:  ZOLOFT TAKE 1 TABLET BY MOUTH EVERY DAY   VITAMIN C PO Take by mouth.     the patient's daughter was presented during today's visit.   Review of Systems  Constitutional: Negative for activity change, appetite change, chills, diaphoresis, fatigue and fever.  HENT: Positive for hearing loss. Negative for sore throat, trouble swallowing and voice change.        R+L hearing aids.   Eyes: Negative for pain, redness, itching and visual disturbance.       Dry eyes  Respiratory: Negative for cough, choking, chest tightness and shortness of breath.   Cardiovascular: Negative for chest pain, palpitations and leg swelling.  Gastrointestinal: Negative for abdominal distention, abdominal pain, constipation, diarrhea, nausea and vomiting.  Endocrine: Negative for cold intolerance and heat intolerance.  Genitourinary: Negative for difficulty urinating, dysuria, flank pain, frequency, hematuria and urgency.       Urinary leakage.   Musculoskeletal: Positive for arthralgias and gait problem. Negative for back pain, joint swelling, myalgias, neck pain and neck stiffness.       Waling with cane  Skin: Negative for color change, pallor, rash and wound.       Long standing dark spots on her cheeks. Upper chest raised itching skin growth  Allergic/Immunologic: Negative for environmental allergies.  Neurological: Negative for dizziness, seizures, syncope, speech difficulty,  weakness and headaches.  Hematological: Does not bruise/bleed easily.  Psychiatric/Behavioral: Positive for confusion. Negative for agitation, behavioral problems, hallucinations and sleep disturbance. The patient is not nervous/anxious.     Immunization History  Administered Date(s) Administered  . Influenza Whole 05/28/2012  . Influenza, High Dose Seasonal PF 06/08/2016  . Influenza-Unspecified 05/28/2014, 08/28/2014, 05/27/2015  . Pneumococcal Conjugate-13 01/21/2014  . Pneumococcal Polysaccharide-23 04/16/2015  . Td 09/29/2003  . Zoster 01/14/2011   Pertinent  Health Maintenance Due  Topic Date Due  . INFLUENZA VACCINE  03/28/2017  . DEXA SCAN  Completed  . PNA vac Low Risk Adult  Completed   Fall Risk  05/01/2017 07/17/2016 06/09/2015 05/20/2015 04/16/2015  Falls in the past year? No Yes No No No  Comment - tripped over the last stair in the house - - -  Number falls in past yr: -  1 - - -   Functional Status Survey:    Vitals:   04/27/17 1014  BP: 120/60  Pulse: 64  Resp: 18  Temp: 97.6 F (36.4 C)  Weight: 171 lb 6.4 oz (77.7 kg)  Height: 5\' 4"  (1.626 m)   Body mass index is 29.42 kg/m. Physical Exam  Constitutional: She appears well-developed and well-nourished. No distress.  HENT:  Head: Normocephalic and atraumatic.  Right Ear: External ear normal.  Left Ear: External ear normal.  Nose: Nose normal.  Mouth/Throat: Oropharynx is clear and moist. No oropharyngeal exudate.  Eyes: Pupils are equal, round, and reactive to light. Conjunctivae and EOM are normal. No scleral icterus.  Neck: Normal range of motion. Neck supple. No thyromegaly present.  Cardiovascular: Normal rate, regular rhythm and normal heart sounds.   No murmur heard. Pulmonary/Chest: Effort normal and breath sounds normal. No respiratory distress. She has no wheezes. She has no rales.  Abdominal: Soft. Bowel sounds are normal. She exhibits no distension. There is no tenderness. There is no  rebound and no guarding.  Musculoskeletal: Normal range of motion. She exhibits no edema or tenderness.  Lymphadenopathy:    She has no cervical adenopathy.  Neurological: She is alert. No cranial nerve deficit. She exhibits normal muscle tone. Coordination normal.  Oriented to person and place  Skin: Skin is warm and dry. No rash noted. She is not diaphoretic. No erythema. No pallor.  Small dark spots on her cheeks. Skin colored raised itching growth on her chest.   Psychiatric: She has a normal mood and affect.    Labs reviewed:  Recent Labs  04/26/17  NA 139  K 3.9  BUN 14  CREATININE 0.5    Recent Labs  04/26/17  AST 24  ALT 22  ALKPHOS 69    Recent Labs  04/26/17  WBC 7.1  HGB 14.6  HCT 43  PLT 209   Lab Results  Component Value Date   TSH 2.55 04/26/2017   Lab Results  Component Value Date   HGBA1C 5.3 04/26/2017   Lab Results  Component Value Date   CHOL 206 (A) 04/26/2017   HDL 52 04/26/2017   LDLCALC 132 04/26/2017   LDLDIRECT 173.0 08/07/2012   TRIG 108 04/26/2017   CHOLHDL 5 03/20/2016    Significant Diagnostic Results in last 30 days:  No results found.  Assessment/Plan  Essential hypertension, benign Blood pressure is controlled, continue Atenolol 50mg  daily, update CMP, lipid panel.   Alzheimer's disease Resides in [facility], frequent verbal cues needed. Update MMSE, obtain TSH CBC Vit D  History of dizziness Resolved, denied chest pain, palpitation, SOB, change of vision, headache associated with the event, will obtain EKG, observe  Major depressive disorder, single episode, moderate Mood is stable, continue Sertraline 100mg  qd.   Polymyalgia rheumatica (HCC) Well managed, taking Prednisone 3mg  qd, f/u Rheumatology, update Hgb a1c.   Pigmented skin lesion of uncertain nature On her R+L cheeks, taking 120 Sal AC 30% in petrolatum daily.   Pruritus In her chest, many raised skin colored moles, c/o itching, no rash, will apply 1%  hydrocortisone cream qhs until healed for symptomatic management. Observe.   Counseling regarding advanced care planning and goals of care Went over care plan with patient's guardian/HCPOA who is her daughter today.  Care plan from 3 pm to 3:30pm, I have gone over MOST form and filled it out. Patient with be Do not resuscitate. Will transfer to hospital if indicated, avoid intensive care.  Antibiotic if indicated/determine use or limitation of antibiotics for infection. Iv fluids if indicated for defined period. No feeding tube insertion. Daughter has signed the form along with myself. DNR form is validated.  I have spent 30 minutes doing advance care plan meeting with her family. Answered questions from daughter/ HCPOA.     Family/ staff Communication: plan of care reviewed with the patient, POA, charge nurse.   Labs/tests ordered: CBC CMP TSH Hgb a1c Lipid panel EKG MMSE  Time spend 45 minutes.

## 2017-04-25 NOTE — Assessment & Plan Note (Signed)
Mood is stable, continue Sertraline 100mg qd.  

## 2017-04-25 NOTE — Assessment & Plan Note (Signed)
Well managed, taking Prednisone 3mg  qd, f/u Rheumatology, update Hgb a1c.

## 2017-04-25 NOTE — Assessment & Plan Note (Signed)
Resides in [facility], frequent verbal cues needed. Update MMSE, obtain TSH CBC Vit D

## 2017-04-25 NOTE — Assessment & Plan Note (Signed)
On her R+L cheeks, taking 120 Sal AC 30% in petrolatum daily.

## 2017-04-26 LAB — CBC AND DIFFERENTIAL
HEMATOCRIT: 43 (ref 36–46)
HEMOGLOBIN: 14.6 (ref 12.0–16.0)
PLATELETS: 209 (ref 150–399)
WBC: 7.1

## 2017-04-26 LAB — HEPATIC FUNCTION PANEL
ALK PHOS: 69 (ref 25–125)
ALT: 22 (ref 7–35)
AST: 24 (ref 13–35)
BILIRUBIN, TOTAL: 0.5

## 2017-04-26 LAB — LIPID PANEL
Cholesterol: 206 — AB (ref 0–200)
HDL: 52 (ref 35–70)
LDL Cholesterol: 132
LDL/HDL RATIO: 4
TRIGLYCERIDES: 108 (ref 40–160)

## 2017-04-26 LAB — TSH: TSH: 2.55 (ref <=5.90)

## 2017-04-26 LAB — BASIC METABOLIC PANEL
BUN: 14 (ref 4–21)
Creatinine: 0.5 (ref <=1.1)
Glucose: 81
POTASSIUM: 3.9 (ref 3.4–5.3)
Sodium: 139 (ref 137–147)

## 2017-04-26 LAB — HEMOGLOBIN A1C: HEMOGLOBIN A1C: 5.3

## 2017-04-27 ENCOUNTER — Other Ambulatory Visit: Payer: Self-pay | Admitting: *Deleted

## 2017-04-27 ENCOUNTER — Encounter: Payer: Self-pay | Admitting: Nurse Practitioner

## 2017-04-27 DIAGNOSIS — E785 Hyperlipidemia, unspecified: Secondary | ICD-10-CM | POA: Insufficient documentation

## 2017-05-01 ENCOUNTER — Encounter: Payer: Self-pay | Admitting: Adult Health

## 2017-05-01 ENCOUNTER — Ambulatory Visit (INDEPENDENT_AMBULATORY_CARE_PROVIDER_SITE_OTHER): Payer: Medicare Other | Admitting: Adult Health

## 2017-05-01 VITALS — BP 133/67 | HR 57 | Wt 170.4 lb

## 2017-05-01 DIAGNOSIS — F0280 Dementia in other diseases classified elsewhere without behavioral disturbance: Secondary | ICD-10-CM

## 2017-05-01 DIAGNOSIS — Z7189 Other specified counseling: Secondary | ICD-10-CM | POA: Insufficient documentation

## 2017-05-01 DIAGNOSIS — G301 Alzheimer's disease with late onset: Secondary | ICD-10-CM

## 2017-05-01 NOTE — Patient Instructions (Signed)
Your Plan:  Continue fish oil 1200 mg daily Memory score has improved slightly If your symptoms worsen or you develop new symptoms please let us know.    [facility]. I hope we have been able to provide you high quality care today.  You may receive a patient satisfaction survey over the next few weeks. We would appreciate your feedback and comments so that we may continue to improve ourselves and the health of our patients.

## 2017-05-01 NOTE — Progress Notes (Signed)
I have read the note, and I agree with the clinical assessment and plan.  Keyuana Wank KEITH   

## 2017-05-01 NOTE — Assessment & Plan Note (Signed)
Went over care plan with patient's guardian/HCPOA who is her daughter today.  Care plan from 3 pm to 3:30pm, I have gone over MOST form and filled it out. Patient with be Do not resuscitate. Will transfer to hospital if indicated, avoid intensive care.  Antibiotic if indicated/determine use or limitation of antibiotics for infection. Iv fluids if indicated for defined period. No feeding tube insertion. Daughter has signed the form along with myself. DNR form is validated.  I have spent 30 minutes doing advance care plan meeting with her family. Answered questions from daughter/ HCPOA.

## 2017-05-01 NOTE — Progress Notes (Signed)
PATIENT: Tricia Mendez DOB: 02-06-1924  REASON FOR VISIT: follow up-memory HISTORY FROM: patient  HISTORY OF PRESENT ILLNESS: Today 05/01/17 Tricia Mendez is a 83 year old female with a history of progressive memory disturbance consistent with Alzheimer's disease. She returns today for follow-up. In the past she is not been able to tolerate Namenda, Aricept, Exelon or vayacog. The patient and her family decided instead to go on Fish oil 1200 mg daily.She continues to live in an assisted living facility. She reports that she is able to complete all ADLs independently. Denies any changes with her appetite. Reports that she sleeps well. Denies any changes in her mood or behavior. Denies any aggressive yes her agitation. She remains on Zoloft. Daughter reports that times that she is very "antsy." Overall they have not noticed any significant changes. She returns today for an evaluation.  HISTORY 10/12/16: Tricia Mendez is a 83 year old right-handed white female with a history of a progressive memory disturbance consistent with Alzheimer's disease. The patient continues to progress, she is having increasing problems with word finding and difficulty with reading. The patient remains active, she likes to walk quite a bit, she may be somewhat agitated at times, the patient is on Zoloft taking 100 mg daily. The patient has not been able to tolerate Namenda or Aricept, she was switched to low-dose Exelon capsules, she can only take the capsule during the morning as the evening dose results in vivid dreams. The patient is not operating a motor vehicle, she lives in an extended care facility, she recently has moved from independent living to assisted living. She returns this office for an evaluation  REVIEW OF SYSTEMS: Out of a complete 14 system review of symptoms, the patient complains only of the following symptoms, and all other reviewed systems are  negative.  Eye itching, eye redness, incontinence of  bladder, memory loss, bruise easily  ALLERGIES: Allergies  Allergen Reactions  . Donepezil Other (See Comments)    BAD DREAMS  . Remeron [Mirtazapine] Other (See Comments)    Dizzy     HOME MEDICATIONS: Outpatient Medications Prior to Visit  Medication Sig Dispense Refill  . ALREX 0.2 % SUSP Instill 1 drop in both eyes twice daily as needed  6  . Ascorbic Acid (VITAMIN C PO) Take by mouth.    Marland Kitchen atenolol (TENORMIN) 50 MG tablet Take 1 tablet (50 mg total) by mouth daily. 90 tablet 1  . CRANBERRY PO Take by mouth.    . doxycycline (VIBRA-TABS) 100 MG tablet Take 1 tablet (100 mg total) by mouth 2 (two) times daily. 14 tablet 0  . fluticasone (FLONASE) 50 MCG/ACT nasal spray Place 2 sprays into both nostrils daily. (Patient not taking: Reported on 05/01/2017) 16 g 11  . Multiple Vitamin (MULTIVITAMIN WITH MINERALS) TABS Take 1 tablet by mouth every morning.    . Phosphatidylserine-DHA-EPA (VAYACOG) 100-19.5-6.5 MG CAPS Take 1 tablet by mouth daily. 90 capsule 1  . potassium chloride (K-DUR,KLOR-CON) 10 MEQ tablet Take 10 mEq by mouth daily.    . predniSONE (DELTASONE) 1 MG tablet Take 3 mg by mouth daily.     . Probiotic Product (PROBIOTIC PO) Take 1 capsule by mouth daily.     . rivastigmine (EXELON) 1.5 MG capsule Take 2 capsules (3 mg total) by mouth 2 (two) times daily. (Patient not taking: Reported on 05/01/2017) 120 capsule 0  . sertraline (ZOLOFT) 100 MG tablet TAKE 1 TABLET BY MOUTH EVERY DAY 30 tablet 3  . colchicine 0.6  MG tablet 1.2 mg ORALLY ax 1  And can repeat  0.6 mg one hr later; MAX 1.8 mg then 1 qd for 2 weeks 30 tablet 1   No facility-administered medications prior to visit.     PAST MEDICAL HISTORY: Past Medical History:  Diagnosis Date  . Acute bronchitis 06/27/2015  . Alzheimer's dementia   . Cataract   . Cellulitis of left leg   . Depression   . Eczema   . High cholesterol   . History of renal stone    excision  . Hypertension   . Mild cognitive impairment    . Osteoarthritis   . Polymyalgia rheumatica (HCC)    rx with prednisone  . Urinary incontinence   . Varicose veins   . Xerosis of skin     PAST SURGICAL HISTORY: Past Surgical History:  Procedure Laterality Date  . cataract surgery    . EYE SURGERY    . JOINT REPLACEMENT Bilateral    Dr. Trudee GripFrank Alusio  . kidney stone surgey    . lipoma removal      FAMILY HISTORY: Family History  Problem Relation Age of Onset  . Melanoma Mother        [id number]  . Breast cancer Mother        [id number]  . Stroke Father        [id number]  . Arthritis Father        [id number]  . Diabetes Father [id number]       borderline diabetic  . Memory loss Sister   . Osteoporosis Other     SOCIAL HISTORY: Social History   Social History  . Marital status: Widowed    Spouse name: N/A  . Number of children: 3  . Years of education: 1 yr college   Occupational History  . Retired Runner, broadcasting/film/videoteacher    Social History Main Topics  . Smoking status: Former Smoker    Start date: 08/07/1944    Quit date: 10/13/1945  . Smokeless tobacco: Former NeurosurgeonUser    Quit date: 10/01/1945  . Alcohol use No  . Drug use: No  . Sexual activity: Not on file   Other Topics Concern  . Not on file   Social History Narrative   Widowed remote hx of tobacco x 4 years   [facility] [place]   G4P3   Daughters have HCPOA  If needed      Diet: Regular, Healthy Foods      Do you drink/ eat things with caffeine? Coffee in a.m. Approx. 2 cups per day.      Right-handed      Marital status: Married/Widowed                             What year were you married ? Twice, 1st 1948, 2nd 1992      Do you live in a house, apartment,assistred living, condo, trailer, etc.)? Apartment      Is it one or more stories? No      How many persons live in your home ? 1      Do you have any pets in your home ?(please list) No      Current or past profession: Research officer, trade unionTeachers Assistant       Do you exercise? Yes  Type & how often:  Walk daily around facility.      Do you have a living will? Yes      Do you have a DNR form? Yes                      If not, do you want to discuss one? Yes      Do you have signed POA?HPOA forms?  Yes               If so, please bring to your        appointment               PHYSICAL EXAM  Vitals:   05/01/17 1037  BP: 133/67  Pulse: (!) 57  Weight: 170 lb 6.4 oz (77.3 kg)   Body mass index is 29.25 kg/m.   MMSE - Mini Mental State Exam 05/01/2017 10/12/2016 04/06/2016  Orientation to time 2 1 2   Orientation to Place 5 3 4   Registration 3 3 3   Attention/ Calculation 0 0 3  Recall 0 0 0  Language- name 2 objects 2 2 2   Language- repeat 0 0 1  Language- follow 3 step command 2 1 2   Language- read & follow direction 1 0 1  Write a sentence 1 1 0  Copy design 0 0 1  Total score 16 11 19      Generalized: Well developed, in no acute distress   Neurological examination  Mentation: Alert oriented to time, place, history taking. Follows all commands speech and language fluent Cranial nerve II-XII: Pupils were equal round reactive to light. Extraocular movements were full, visual field were full on confrontational test. Facial sensation and strength were normal. Uvula tongue midline. Head turning and shoulder shrug  were normal and symmetric. Motor: The motor testing reveals 5 over 5 strength of all 4 extremities. Good symmetric motor tone is noted throughout.  Sensory: Sensory testing is intact to soft touch on all 4 extremities. No evidence of extinction is noted.  Coordination: Cerebellar testing reveals good finger-nose-finger and heel-to-shin bilaterally.  Gait and station: Gait is slightly unsteady. Tandem gait not attempted Reflexes: Deep tendon reflexes are symmetric and normal bilaterally.   DIAGNOSTIC DATA (LABS, IMAGING, TESTING) - I reviewed patient records, labs, notes, testing and imaging myself where available.  Lab Results  Component Value Date   WBC 7.1  04/26/2017   HGB 14.6 04/26/2017   HCT 43 04/26/2017   MCV 84.7 03/20/2016   PLT 209 04/26/2017      Component Value Date/Time   NA 139 04/26/2017   K 3.9 04/26/2017   CL 100 03/20/2016 1229   CO2 32 03/20/2016 1229   GLUCOSE 84 03/20/2016 1229   BUN 14 04/26/2017   CREATININE 0.5 04/26/2017   CREATININE 0.56 03/20/2016 1229   CALCIUM 10.1 03/20/2016 1229   CALCIUM 10.1 04/06/2010 2146   PROT 6.7 01/21/2014 1039   ALBUMIN 4.0 01/21/2014 1039   AST 24 04/26/2017   ALT 22 04/26/2017   ALKPHOS 69 04/26/2017   BILITOT 0.8 01/21/2014 1039   GFRNONAA 78 03/24/2015 0901   GFRAA 90 03/24/2015 0901   Lab Results  Component Value Date   CHOL 206 (A) 04/26/2017   HDL 52 04/26/2017   LDLCALC 132 04/26/2017   LDLDIRECT 173.0 08/07/2012   TRIG 108 04/26/2017   CHOLHDL 5 03/20/2016   Lab Results  Component Value Date   HGBA1C 5.3 04/26/2017   Lab Results  Component Value Date   VITAMINB12 827 03/20/2016   Lab Results  Component Value Date   TSH 2.55 04/26/2017      ASSESSMENT AND PLAN 83 y.o. year old female  has a past medical history of Acute bronchitis (06/27/2015); Alzheimer's dementia; Cataract; Cellulitis of left leg; Depression; Eczema; High cholesterol; History of renal stone; Hypertension; Mild cognitive impairment; Osteoarthritis; Polymyalgia rheumatica (HCC); Urinary incontinence; Varicose veins; and Xerosis of skin. here with:  1. Alzheimer's disease  The patient's memory score has slightly improved. She will continue on Fish oil 1200 mg daily. I have advised the patient and her daughter that if her ymptoms worsen or she develops new symptoms that should let us know. Will follow-up in 6 months or sooner if needed.  I spent 15 minutes with the patient. 50% of this time was spent reviewing the patient's memory score.   Butch Penny, MSN, NP-C 05/01/2017, 10:55 AM [facility] [address], [address] [place], [place] [address] [phone]

## 2017-05-04 ENCOUNTER — Encounter: Payer: Self-pay | Admitting: *Deleted

## 2017-05-08 ENCOUNTER — Encounter: Payer: Self-pay | Admitting: *Deleted

## 2017-05-10 ENCOUNTER — Non-Acute Institutional Stay: Payer: Medicare Other

## 2017-05-10 DIAGNOSIS — Z0000 Encounter for general adult medical examination without abnormal findings: Secondary | ICD-10-CM | POA: Diagnosis not present

## 2017-05-10 NOTE — Patient Instructions (Addendum)
[name] , Thank you for taking time to come for your Medicare Wellness Visit. I appreciate your ongoing commitment to your health goals. Please review the following plan we discussed and let me know if I can assist you in the future.   Screening recommendations/referrals: Colonoscopy excluded, pt over age 81 Mammogram exclued, pt over age 81 Bone Density up to date Recommended yearly ophthalmology/optometry visit for glaucoma screening and checkup Recommended yearly dental visit for hygiene and checkup  Vaccinations: Influenza vaccine due Pneumococcal vaccine up to date Tdap vaccine up to date. Due 04/17/21 Shingles vaccine not in records    Advanced directives: Need a copy for chart  Conditions/risks identified: None  Next appointment: Tricia Mendez makes rounds   Preventive Care 65 Years and Older, Female Preventive care refers to lifestyle choices and visits with your health care provider that can promote health and wellness. What does preventive care include?  A yearly physical exam. This is also called an annual well check.  Dental exams once or twice a year.  Routine eye exams. Ask your health care provider how often you should have your eyes checked.  Personal lifestyle choices, including:  Daily care of your teeth and gums.  Regular physical activity.  Eating a healthy diet.  Avoiding tobacco and drug use.  Limiting alcohol use.  Practicing safe sex.  Taking low-dose aspirin every day.  Taking vitamin and mineral supplements as recommended by your health care provider. What happens during an annual well check? The services and screenings done by your health care provider during your annual well check will depend on your age, overall health, lifestyle risk factors, and family history of disease. Counseling  Your health care provider may ask you questions about your:  Alcohol use.  Tobacco use.  Drug use.  Emotional well-being.  Home and  relationship well-being.  Sexual activity.  Eating habits.  History of falls.  Memory and ability to understand (cognition).  Work and work Astronomerenvironment.  Reproductive health. Screening  You may have the following tests or measurements:  Height, weight, and BMI.  Blood pressure.  Lipid and cholesterol levels. These may be checked every 5 years, or more frequently if you are over 81 years old.  Skin check.  Lung cancer screening. You may have this screening every year starting at age 81 if you have a 30-pack-year history of smoking and currently smoke or have quit within the past 15 years.  Fecal occult blood test (FOBT) of the stool. You may have this test every year starting at age 81.  Flexible sigmoidoscopy or colonoscopy. You may have a sigmoidoscopy every 5 years or a colonoscopy every 10 years starting at age 81.  Hepatitis C blood test.  Hepatitis B blood test.  Sexually transmitted disease (STD) testing.  Diabetes screening. This is done by checking your blood sugar (glucose) after you have not eaten for a while (fasting). You may have this done every 1-3 years.  Bone density scan. This is done to screen for osteoporosis. You may have this done starting at age 81.  Mammogram. This may be done every 1-2 years. Talk to your health care provider about how often you should have regular mammograms. Talk with your health care provider about your test results, treatment options, and if necessary, the need for more tests. Vaccines  Your health care provider may recommend certain vaccines, such as:  Influenza vaccine. This is recommended every year.  Tetanus, diphtheria, and acellular pertussis (Tdap, Td) vaccine. You  may need a Td booster every 10 years.  Zoster vaccine. You may need this after age 50.  Pneumococcal 13-valent conjugate (PCV13) vaccine. One dose is recommended after age 56.  Pneumococcal polysaccharide (PPSV23) vaccine. One dose is recommended after  age 33. Talk to your health care provider about which screenings and vaccines you need and how often you need them. This information is not intended to replace advice given to you by your health care provider. Make sure you discuss any questions you have with your health care provider. Document Released: 09/10/2015 Document Revised: 05/03/2016 Document Reviewed: 06/15/2015 Elsevier Interactive Patient Education  2017 [place] Prevention in the Home Falls can cause injuries. They can happen to people of all ages. There are many things you can do to make your home safe and to help prevent falls. What can I do on the outside of my home?  Regularly fix the edges of walkways and driveways and fix any cracks.  Remove anything that might make you trip as you walk through a door, such as a raised step or threshold.  Trim any bushes or trees on the path to your home.  Use bright outdoor lighting.  Clear any walking paths of anything that might make someone trip, such as rocks or tools.  Regularly check to see if handrails are loose or broken. Make sure that both sides of any steps have handrails.  Any raised decks and porches should have guardrails on the edges.  Have any leaves, snow, or ice cleared regularly.  Use sand or salt on walking paths during winter.  Clean up any spills in your garage right away. This includes oil or grease spills. What can I do in the bathroom?  Use night lights.  Install grab bars by the toilet and in the tub and shower. Do not use towel bars as grab bars.  Use non-skid mats or decals in the tub or shower.  If you need to sit down in the shower, use a plastic, non-slip stool.  Keep the floor dry. Clean up any water that spills on the floor as soon as it happens.  Remove soap buildup in the tub or shower regularly.  Attach bath mats securely with double-sided non-slip rug tape.  Do not have throw rugs and other things on the floor that can  make you trip. What can I do in the bedroom?  Use night lights.  Make sure that you have a light by your bed that is easy to reach.  Do not use any sheets or blankets that are too big for your bed. They should not hang down onto the floor.  Have a firm chair that has side arms. You can use this for support while you get dressed.  Do not have throw rugs and other things on the floor that can make you trip. What can I do in the kitchen?  Clean up any spills right away.  Avoid walking on wet floors.  Keep items that you use a lot in easy-to-reach places.  If you need to reach something above you, use a strong step stool that has a grab bar.  Keep electrical cords out of the way.  Do not use floor polish or wax that makes floors slippery. If you must use wax, use non-skid floor wax.  Do not have throw rugs and other things on the floor that can make you trip. What can I do with my stairs?  Do not leave any items  on the stairs.  Make sure that there are handrails on both sides of the stairs and use them. Fix handrails that are broken or loose. Make sure that handrails are as long as the stairways.  Check any carpeting to make sure that it is firmly attached to the stairs. Fix any carpet that is loose or worn.  Avoid having throw rugs at the top or bottom of the stairs. If you do have throw rugs, attach them to the floor with carpet tape.  Make sure that you have a light switch at the top of the stairs and the bottom of the stairs. If you do not have them, ask someone to add them for you. What else can I do to help prevent falls?  Wear shoes that:  Do not have high heels.  Have rubber bottoms.  Are comfortable and fit you well.  Are closed at the toe. Do not wear sandals.  If you use a stepladder:  Make sure that it is fully opened. Do not climb a closed stepladder.  Make sure that both sides of the stepladder are locked into place.  Ask someone to hold it for you,  if possible.  Clearly mark and make sure that you can see:  Any grab bars or handrails.  First and last steps.  Where the edge of each step is.  Use tools that help you move around (mobility aids) if they are needed. These include:  Canes.  Walkers.  Scooters.  Crutches.  Turn on the lights when you go into a dark area. Replace any light bulbs as soon as they burn out.  Set up your furniture so you have a clear path. Avoid moving your furniture around.  If any of your floors are uneven, fix them.  If there are any pets around you, be aware of where they are.  Review your medicines with your doctor. Some medicines can make you feel dizzy. This can increase your chance of falling. Ask your doctor what other things that you can do to help prevent falls. This information is not intended to replace advice given to you by your health care provider. Make sure you discuss any questions you have with your health care provider. Document Released: 06/10/2009 Document Revised: 01/20/2016 Document Reviewed: 09/18/2014 Elsevier Interactive Patient Education  2017 [employer].

## 2017-05-10 NOTE — Progress Notes (Signed)
Subjective:   Tricia Mendez is a 83 y.o. female who presents for Medicare Annual (Subsequent) preventive examination at [facility]  Last AWV-07/20/14    Objective:     Vitals: BP (!) 108/58 (BP Location: Left Arm, Patient Position: Sitting)   Pulse 60   Temp 97.7 F (36.5 C) (Oral)   Ht  (1.626 m)   Wt 170 lb (77.1 kg)   SpO2 96%   BMI 29.18 kg/m   Body mass index is 29.18 kg/m.   Tobacco History  Smoking Status  . Former Smoker  . Start date: 08/07/1944  . Quit date: 10/13/1945  Smokeless Tobacco  . Former Neurosurgeon  . Quit date: 10/01/1945     Counseling given: Not Answered   Past Medical History:  Diagnosis Date  . Acute bronchitis 06/27/2015  . Alzheimer's dementia   . Cataract   . Cellulitis of left leg   . Depression   . Eczema   . High cholesterol   . History of renal stone    excision  . Hypertension   . Mild cognitive impairment   . Osteoarthritis   . Polymyalgia rheumatica (HCC)    rx with prednisone  . Urinary incontinence   . Varicose veins   . Xerosis of skin    Past Surgical History:  Procedure Laterality Date  . cataract surgery    . EYE SURGERY    . JOINT REPLACEMENT Bilateral    Dr. Trudee Grip  . kidney stone surgey    . lipoma removal     Family History  Problem Relation Age of Onset  . Melanoma Mother        71  . Breast cancer Mother        32  . Stroke Father        37  . Arthritis Father        61  . Diabetes Father 64       borderline diabetic  . Memory loss Sister   . Osteoporosis Other    History  Sexual Activity  . Sexual activity: Not on file    Outpatient Encounter Prescriptions as of 05/10/2017  Medication Sig  . ALREX 0.2 % SUSP Instill 1 drop in both eyes twice daily as needed  . atenolol (TENORMIN) 50 MG tablet Take 1 tablet (50 mg total) by mouth daily.  . carboxymethylcellulose (REFRESH PLUS) 0.5 % SOLN 1 drop 4 (four) times daily.  . Multiple Vitamin (MULTIVITAMIN  WITH MINERALS) TABS Take 1 tablet by mouth every morning.  . Omega-3 Fatty Acids (FISH OIL) 1200 MG CAPS Take by mouth.  . potassium chloride (K-DUR,KLOR-CON) 10 MEQ tablet Take 10 mEq by mouth daily.  . predniSONE (DELTASONE) 1 MG tablet Take 3 mg by mouth daily.   . Probiotic Product (PROBIOTIC PO) Take 1 capsule by mouth daily.   . sertraline (ZOLOFT) 100 MG tablet TAKE 1 TABLET BY MOUTH EVERY DAY  . [DISCONTINUED] Ascorbic Acid (VITAMIN C PO) Take by mouth.  . [DISCONTINUED] CRANBERRY PO Take by mouth.  . [DISCONTINUED] doxycycline (VIBRA-TABS) 100 MG tablet Take 1 tablet (100 mg total) by mouth 2 (two) times daily.  . [DISCONTINUED] fluticasone (FLONASE) 50 MCG/ACT nasal spray Place 2 sprays into both nostrils daily. (Patient not taking: Reported on 05/01/2017)  . [DISCONTINUED] Phosphatidylserine-DHA-EPA (VAYACOG) 100-19.5-6.5 MG CAPS Take 1 tablet by mouth daily.  . [DISCONTINUED] rivastigmine (EXELON) 1.5 MG capsule Take 2 capsules (3 mg total) by mouth 2 (  two) times daily. (Patient not taking: Reported on 05/01/2017)   No facility-administered encounter medications on file as of 05/10/2017.     Activities of Daily Living In your present state of health, do you have any difficulty performing the following activities: 05/10/2017  Hearing? Y  Vision? N  Difficulty concentrating or making decisions? Y  Walking or climbing stairs? Y  Dressing or bathing? N  Doing errands, shopping? Y  Preparing Food and eating ? Y  Using the Toilet? Y  In the past six months, have you accidently leaked urine? Y  Do you have problems with loss of bowel control? N  Managing your Medications? Y  Managing your Finances? Y  Housekeeping or managing your Housekeeping? Y  Some recent data might be hidden    Patient Care Team: Oneal GroutPandey, Mahima, MD as PCP - General (Internal Medicine) Donnetta HailBeekman, James F, MD (Rheumatology) Ernesto RutherfordGroat, Robert, MD (Ophthalmology) Nita SellsHall, John, MD (Dermatology) Crist FatHerrick, Benjamin W, MD  as Attending Physician (Urology) Mast, Man X, NP as Nurse Practitioner (Internal Medicine)    Assessment:     Exercise Activities and Dietary recommendations Current Exercise Habits: The patient does not participate in regular exercise at present, Exercise limited by: None identified  Goals    None     Fall Risk Fall Risk  05/10/2017 05/01/2017 07/17/2016 06/09/2015 05/20/2015  Falls in the past year? No No Yes No No  Comment - - tripped over the last stair in the house - -  Number falls in past yr: - - 1 - -   Depression Screen PHQ 2/9 Scores 05/10/2017 07/17/2016 06/19/2015 04/16/2015  PHQ - 2 Score 0 0 0 0     Cognitive Function MMSE - Mini Mental State Exam 05/08/2017 05/08/2017 05/04/2017 05/01/2017 10/12/2016  Orientation to time 0 0 4 2 1   Orientation to Place 0 0 4 5 3   Registration 3 3 0 3 3  Attention/ Calculation 3 3 5  0 0  Recall 0 0 0 0 0  Language- name 2 objects 2 2 2 2 2   Language- repeat 1 1 0 0 0  Language- follow 3 step command 3 3 3 2 1   Language- read & follow direction 1 1 1 1  0  Write a sentence 1 1 1 1 1   Copy design 0 0 0 0 0  Total score 14 14 20 16 11         Immunization History  Administered Date(s) Administered  . Influenza Whole 05/28/2012  . Influenza, High Dose Seasonal PF 06/08/2016  . Influenza-Unspecified 05/28/2014, 08/28/2014, 05/27/2015  . Pneumococcal Conjugate-13 01/21/2014  . Pneumococcal Polysaccharide-23 04/16/2015  . Td 09/29/2003  . Zoster 01/14/2011   Screening Tests Health Maintenance  Topic Date Due  . INFLUENZA VACCINE  03/28/2017  . TETANUS/TDAP  04/17/2021  . DEXA SCAN  Completed  . PNA vac Low Risk Adult  Completed      Plan:  I have personally reviewed and addressed the Medicare Annual Wellness questionnaire and have noted the following in the patient's chart:  A. Medical and social history B. Use of alcohol, tobacco or illicit drugs  C. Current medications and supplements D. Functional ability and status E.    Nutritional status F.  Physical activity G. Advance directives H. List of other physicians I.  Hospitalizations, surgeries, and ER visits in previous 12 months J.  Vitals K. Screenings to include hearing, vision, cognitive, depression L. Referrals and appointments - none  In addition, I have reviewed and discussed with  patient certain preventive protocols, quality metrics, and best practice recommendations. A written personalized care plan for preventive services as well as general preventive health recommendations were provided to patient.  See attached scanned questionnaire for additional information.   Signed,   Annetta Maw, RN Nurse Health Advisor   Quick Notes   Health Maintenance: Pt will get flu vaccine when facility gets them in stock.     Abnormal Screen: MMSE 14/30 on 05/08/17     Patient Concerns: None     Nurse Concerns: None

## 2017-05-18 ENCOUNTER — Non-Acute Institutional Stay: Payer: Medicare Other | Admitting: Internal Medicine

## 2017-05-18 ENCOUNTER — Encounter: Payer: Self-pay | Admitting: Internal Medicine

## 2017-05-18 DIAGNOSIS — E559 Vitamin D deficiency, unspecified: Secondary | ICD-10-CM | POA: Diagnosis not present

## 2017-05-18 DIAGNOSIS — Z780 Asymptomatic menopausal state: Secondary | ICD-10-CM

## 2017-05-18 NOTE — Progress Notes (Signed)
Location:  [facility] Room Number: [phone] Place of Service:  ALF [phone]) Provider:  Oneal Grout, MD  Oneal Grout, MD  Patient Care Team: Oneal Grout, MD as PCP - General (Internal Medicine) Donnetta Hail, MD (Rheumatology) Ernesto Rutherford, MD (Ophthalmology) Nita Sells, MD (Dermatology) Crist Fat, MD as Attending Physician (Urology) Mast, Man X, NP as Nurse Practitioner (Internal Medicine)  Extended Emergency Contact Information Primary Emergency Contact: McClanahan,Susan Address: [address] [place], [place] [address] [place] Home Phone: [phone] Mobile Phone: [phone] Relation: Daughter Secondary Emergency Contact: Gardiner Sleeper [place] Home Phone: [phone] Relation: Daughter  Code Status:  DNR Goals of care: Advanced Directive information Advanced Directives 05/18/2017  Does Patient Have a Medical Advance Directive? Yes  Type of Advance Directive Out of facility DNR (pink MOST or yellow form)  Does patient want to make changes to medical advance directive? No - Patient declined  Copy of Healthcare Power of Attorney in Chart? -  Would patient like information on creating a medical advance directive? -  Pre-existing out of facility DNR order (yellow form or pink MOST form) Yellow form placed in chart (order not valid for inpatient use)     Chief Complaint  Patient presents with  . Acute Visit    Vitamin D deficiency    HPI:  Pt is a 83 y.o. female seen today for an acute visit for low vitamin d level. She is seen with her daughter. No fall reported. She denies any joint pain at present but has occasional arthritis pain to her joints. She walks without assistive device.    Past Medical History:  Diagnosis Date  . Acute bronchitis 06/27/2015  . Alzheimer's dementia   . Cataract   . Cellulitis of left leg   . Depression   . Eczema   . High cholesterol   .  History of renal stone    excision  . Hypertension   . Mild cognitive impairment   . Osteoarthritis   . Polymyalgia rheumatica (HCC)    rx with prednisone  . Urinary incontinence   . Varicose veins   . Xerosis of skin    Past Surgical History:  Procedure Laterality Date  . cataract surgery    . EYE SURGERY    . JOINT REPLACEMENT Bilateral    Dr. Trudee Grip  . kidney stone surgey    . lipoma removal      Allergies  Allergen Reactions  . Donepezil Other (See Comments)    BAD DREAMS  . Remeron [Mirtazapine] Other (See Comments)    Dizzy     Outpatient Encounter Prescriptions as of 05/18/2017  Medication Sig  . atenolol (TENORMIN) 50 MG tablet Take 1 tablet (50 mg total) by mouth daily.  . carboxymethylcellulose (REFRESH PLUS) 0.5 % SOLN 1 drop 4 (four) times daily.  . hydrocortisone cream 1 % Apply 1 application topically at bedtime. Apply to the chest  . Loteprednol Etabonate (LOTEMAX) 0.5 % GEL Apply 1 drop to eye as needed.  . Multiple Vitamin (MULTIVITAMIN WITH MINERALS) TABS Take 1 tablet by mouth every morning.  . Omega-3 Fatty Acids (FISH OIL) 1200 MG CAPS Take 1 capsule by mouth daily.   . potassium chloride (K-DUR,KLOR-CON) 10 MEQ tablet Take 10 mEq by mouth daily.  . predniSONE (DELTASONE) 1 MG tablet Take 3 mg by mouth daily.   . sertraline (ZOLOFT) 100 MG tablet  TAKE 1 TABLET BY MOUTH EVERY DAY  . UNABLE TO FIND Med Name: 120 SAL AC 30% in Petrolatum cream. Apply to right and left cheek dark spots at bedtime.  . [DISCONTINUED] Probiotic Product (PROBIOTIC PO) Take 1 capsule by mouth daily.    No facility-administered encounter medications on file as of 05/18/2017.     Review of Systems  Constitutional: Negative for appetite change, fatigue and fever.  HENT: Negative for mouth sores and rhinorrhea.   Respiratory: Negative for shortness of breath.   Cardiovascular: Negative for chest pain.  Gastrointestinal: Negative for abdominal pain.  Musculoskeletal:  Positive for arthralgias. Negative for back pain and gait problem.  Neurological: Negative for syncope and light-headedness.  Psychiatric/Behavioral: Positive for confusion.    Immunization History  Administered Date(s) Administered  . Influenza Whole 05/28/2012  . Influenza, High Dose Seasonal PF 06/08/2016  . Influenza-Unspecified 05/28/2014, 08/28/2014, 05/27/2015  . Pneumococcal Conjugate-13 01/21/2014  . Pneumococcal Polysaccharide-23 04/16/2015  . Td 09/29/2003  . Zoster 01/14/2011   Pertinent  Health Maintenance Due  Topic Date Due  . INFLUENZA VACCINE  05/28/2017 (Originally 03/28/2017)  . DEXA SCAN  Completed  . PNA vac Low Risk Adult  Completed   Fall Risk  05/10/2017 05/01/2017 07/17/2016 06/09/2015 05/20/2015  Falls in the past year? No No Yes No No  Comment - - tripped over the last stair in the house - -  Number falls in past yr: - - 1 - -   Functional Status Survey:    Vitals:   05/18/17 1319  BP: 140/71  Pulse: 64  Resp: 20  Temp: 97.7 F (36.5 C)  TempSrc: Oral  Weight: 172 lb (78 kg)  Height:  (1.626 m)   Body mass index is 29.52 kg/m. Physical Exam  Constitutional: She appears well-developed and well-nourished. No distress.  HENT:  Head: Normocephalic and atraumatic.  Eyes: Pupils are equal, round, and reactive to light. Conjunctivae are normal.  Neck: Normal range of motion. Neck supple.  Cardiovascular: Normal rate and regular rhythm.   Pulmonary/Chest: Effort normal and breath sounds normal.  Abdominal: Soft. Bowel sounds are normal.  Musculoskeletal: She exhibits edema.  Trace leg edema, can move all 4 extremities  Lymphadenopathy:    She has no cervical adenopathy.  Neurological: She is alert.  Oriented to person and place  Skin: Skin is warm and dry. She is not diaphoretic.  Easy bruising  Psychiatric: She has a normal mood and affect.    Labs reviewed:  Recent Labs  04/26/17  NA 139  K 3.9  BUN 14  CREATININE 0.5     Recent Labs  04/26/17  AST 24  ALT 22  ALKPHOS 69    Recent Labs  04/26/17  WBC 7.1  HGB 14.6  HCT 43  PLT 209   Lab Results  Component Value Date   TSH 2.55 04/26/2017   Lab Results  Component Value Date   HGBA1C 5.3 04/26/2017   Lab Results  Component Value Date   CHOL 206 (A) 04/26/2017   HDL 52 04/26/2017   LDLCALC 132 04/26/2017   LDLDIRECT 173.0 08/07/2012   TRIG 108 04/26/2017   CHOLHDL 5 03/20/2016    Significant Diagnostic Results in last 30 days:  No results found.  Assessment/Plan  Vitamin d def With vit d level of 29. Start vitamin d 50,000 iu weekly x 12 weeks and then 1000 iu daily. Check vit d in 3 months. dexa scan. Start calcium supplement 600 mg bid.  Postmenopausal With estrogen deficiency and chronic prednisone use, obtain dexa scan. dexa 2008 -0.7. Obtain another dexa scan to rule out osteoporosis..    Family/ staff Communication: reviewed care plan with patient and charge nurse.    Labs/tests ordered:  dexa scan, vit d   Oneal Grout, MD Internal Medicine [facility] [address] [place], [place] [address] Cell Phone (Monday-Friday 8 am - 5 pm): [phone] On Call: [phone] and follow prompts after 5 pm and on weekends Office Phone: [phone] Office Fax: [phone]

## 2017-05-18 NOTE — Addendum Note (Signed)
Addended byOneal Grout on: 05/18/2017 02:24 PM   Modules accepted: Level of Service

## 2017-05-31 ENCOUNTER — Encounter: Payer: Self-pay | Admitting: Adult Health

## 2017-05-31 ENCOUNTER — Non-Acute Institutional Stay (SKILLED_NURSING_FACILITY): Payer: Medicare Other | Admitting: Adult Health

## 2017-05-31 DIAGNOSIS — L03115 Cellulitis of right lower limb: Secondary | ICD-10-CM | POA: Diagnosis not present

## 2017-05-31 NOTE — Progress Notes (Addendum)
DATE:  05/31/2017   MRN:  [id number]  BIRTHDAY: [birth year]  Facility:  Nursing Home Location:  [facility]  Nursing Home Room Number: [phone]  LEVEL OF CARE:  [phone])  Contact Information    Name Relation Home Work [place] Daughter [phone]  [phone]   Baker,Carolanne Daughter [phone]         Code Status History    This patient does not have a recorded code status. Please follow your organizational policy for patients in this situation.    Advance Directive Documentation     Most Recent Value  Type of Advance Directive  Out of facility DNR (pink MOST or yellow form)  Pre-existing out of facility DNR order (yellow form or pink MOST form)  Yellow form placed in chart (order not valid for inpatient use)  "MOST" Form in Place?  -       Chief Complaint  Patient presents with  . Acute Visit    Swelling to (R) leg    HISTORY OF PRESENT ILLNESS: This is a 83 year old female who is being seen for an acute visit. She is a resident of of [facility]. She was seen in the room today with charge nurse at bedside. She was noted to have right leg 2+edema, erythema, and  warm to touch. No open wound noted. No fever reported. She is able to ambulate. Charge nurse reported that she has just come back from vacation. She has PMH of HTN, alzheimer's disease, bell's palsy, OA, polymyalgia rheumatica and mixed incontinence.     PAST MEDICAL HISTORY:  Past Medical History:  Diagnosis Date  . Acute bronchitis 06/27/2015  . Alzheimer's dementia   . Cataract   . Cellulitis of left leg   . Depression   . Eczema   . High cholesterol   . History of renal stone    excision  . Hypertension   . Mild cognitive impairment   . Osteoarthritis   . Polymyalgia rheumatica (HCC)    rx with prednisone  . Urinary incontinence   . Varicose veins   . Xerosis of skin      CURRENT MEDICATIONS: Reviewed  Patient's Medications  New  Prescriptions   No medications on file  Previous Medications   ATENOLOL (TENORMIN) 50 MG TABLET    Take 1 tablet (50 mg total) by mouth daily.   CALCIUM CARBONATE (OSCAL) 1500 (600 CA) MG TABS TABLET    Take 600 mg of elemental calcium by mouth 2 (two) times daily with a meal.   CARBOXYMETHYLCELLULOSE (REFRESH PLUS) 0.5 % SOLN    1 drop 4 (four) times daily.   CEPHALEXIN (KEFLEX) 500 MG CAPSULE    Take 500 mg by mouth every 12 (twelve) hours.   HYDROCORTISONE CREAM 1 %    Apply 1 application topically at bedtime. Apply to the chest   LOTEPREDNOL ETABONATE (LOTEMAX) 0.5 % GEL    Apply 1 drop to eye as needed.   MULTIPLE VITAMIN (MULTIVITAMIN WITH MINERALS) TABS    Take 1 tablet by mouth every morning.   OMEGA-3 FATTY ACIDS (FISH OIL) 1200 MG CAPS    Take 1 capsule by mouth daily.    POTASSIUM CHLORIDE (K-DUR,KLOR-CON) 10 MEQ TABLET    Take 10 mEq by mouth daily.   PREDNISONE (DELTASONE) 1 MG TABLET    Take 3 mg by mouth daily.    SACCHAROMYCES BOULARDII (FLORASTOR) 250 MG CAPSULE    Take 250 mg by  mouth 2 (two) times daily.   SERTRALINE (ZOLOFT) 100 MG TABLET    TAKE 1 TABLET BY MOUTH EVERY DAY   UNABLE TO FIND    Med Name: 120 SAL AC 30% in Petrolatum cream. Apply to right and left cheek dark spots at bedtime.  Modified Medications   No medications on file  Discontinued Medications   No medications on file     Allergies  Allergen Reactions  . Donepezil Other (See Comments)    BAD DREAMS  . Remeron [Mirtazapine] Other (See Comments)    Dizzy      REVIEW OF SYSTEMS:  GENERAL: no change in appetite, no fatigue, no weight changes, no fever, chills or weakness MOUTH and THROAT: Denies oral discomfort, gingival pain or bleeding, pain from teeth or hoarseness   RESPIRATORY: no cough, SOB, DOE, wheezing, hemoptysis CARDIAC: no chest pain, edema or palpitations GI: no abdominal pain, diarrhea, constipation, heart burn, nausea or vomiting GU: Denies dysuria, frequency, hematuria,  incontinence, or discharge PSYCHIATRIC: Denies feeling of depression or anxiety. No report of hallucinations, insomnia, paranoia, or agitation     PHYSICAL EXAMINATION  GENERAL APPEARANCE: Well nourished. In no acute distress.  SKIN:  Right lower leg has erythema, warm to touch, edema 2+, no open wound MOUTH and THROAT: Lips are without lesions. Oral mucosa is moist and without lesions.  RESPIRATORY: breathing is even & unlabored, BS CTAB CARDIAC: RRR, no murmur,no extra heart sounds, RLE 2+ edema GI: abdomen soft, normal BS, no masses, no tenderness, no hepatomegaly, no splenomegaly EXTREMITIES:  Able to move X 4 extremities PSYCHIATRIC: Alert to self, disoriented to time and place. Affect and behavior are appropriate    LABS/RADIOLOGY: Labs reviewed: Basic Metabolic Panel:  Recent Labs  16/10/96  NA 139  K 3.9  BUN 14  CREATININE 0.5   Liver Function Tests:  Recent Labs  04/26/17  AST 24  ALT 22  ALKPHOS 69   CBC:  Recent Labs  04/26/17  WBC 7.1  HGB 14.6  HCT 43  PLT 209   Lipid Panel:  Recent Labs  04/26/17  HDL 52      ASSESSMENT/PLAN:  1. Cellulitis of right lower extremity - continue Keflex 500 mg Q 12 hours X 7 days and Florastor 250 mg 1 capsule PO BID X 10 days, keep skin clean and dry, monitor skin for worsening of redness, open area or drainage   Kenard Gower - NP  [facility] [phone]

## 2017-06-15 LAB — HM DEXA SCAN

## 2017-08-16 LAB — VITAMIN D 25 HYDROXY (VIT D DEFICIENCY, FRACTURES): Vit D, 25-Hydroxy: 60

## 2017-08-22 ENCOUNTER — Encounter: Payer: Self-pay | Admitting: Nurse Practitioner

## 2017-08-22 ENCOUNTER — Non-Acute Institutional Stay: Payer: Medicare Other | Admitting: Nurse Practitioner

## 2017-08-22 DIAGNOSIS — J069 Acute upper respiratory infection, unspecified: Secondary | ICD-10-CM | POA: Diagnosis not present

## 2017-08-22 DIAGNOSIS — I10 Essential (primary) hypertension: Secondary | ICD-10-CM | POA: Diagnosis not present

## 2017-08-22 DIAGNOSIS — M150 Primary generalized (osteo)arthritis: Secondary | ICD-10-CM

## 2017-08-22 DIAGNOSIS — M159 Polyosteoarthritis, unspecified: Secondary | ICD-10-CM

## 2017-08-22 NOTE — Assessment & Plan Note (Signed)
Blood pressure is controlled, continue Atenolol 50mg  daily.

## 2017-08-22 NOTE — Progress Notes (Signed)
Location:  [facility] Room Number: [phone] Place of Service:  ALF [phone]) Provider: Towanda Malkin, MD  Patient Care Team: Oneal Grout, MD as PCP - General (Internal Medicine) Donnetta Hail, MD (Rheumatology) Ernesto Rutherford, MD (Ophthalmology) Nita Sells, MD (Dermatology) Crist Fat, MD as Attending Physician (Urology) Mast, Man X, NP as Nurse Practitioner (Internal Medicine)  Extended Emergency Contact Information Primary Emergency Contact: McClanahan,Susan Address: [address] [place], [place] [address] [place] of [place] Home Phone: [phone] Mobile Phone: [phone] Relation: Daughter Secondary Emergency Contact: Gardiner Sleeper [place] Home Phone: [phone] Relation: Daughter  Code Status:  DNR Goals of care: Advanced Directive information Advanced Directives 05/31/2017  Does Patient Have a Medical Advance Directive? Yes  Type of Advance Directive Out of facility DNR (pink MOST or yellow form)  Does patient want to make changes to medical advance directive? No - Patient declined  Copy of Healthcare Power of Attorney in Chart? -  Would patient like information on creating a medical advance directive? No - Patient declined  Pre-existing out of facility DNR order (yellow form or pink MOST form) Yellow form placed in chart (order not valid for inpatient use)       HPI:  Pt is a 83 y.o. female seen today for an acute visit for cough, malaise x 2days, wheezing cough, afebrile, no phlegm production, she denied chest pain, palpitation, SOB, no O2 desaturation.   Hx of HTN, blood pressure is controlled on Atenolol 50mg  daily.   Past Medical History:  Diagnosis Date  . Acute bronchitis 06/27/2015  . Alzheimer's dementia   . Cataract   . Cellulitis of left leg   . Depression   . Eczema   . High cholesterol   . History of renal stone    excision  . Hypertension   . Mild  cognitive impairment   . Osteoarthritis   . Polymyalgia rheumatica (HCC)    rx with prednisone  . Urinary incontinence   . Varicose veins   . Xerosis of skin    Past Surgical History:  Procedure Laterality Date  . cataract surgery    . EYE SURGERY    . JOINT REPLACEMENT Bilateral    Dr. Trudee Grip  . kidney stone surgey    . lipoma removal      Allergies  Allergen Reactions  . Donepezil Other (See Comments)    BAD DREAMS  . Remeron [Mirtazapine] Other (See Comments)    Dizzy     Outpatient Encounter Medications as of 08/22/2017  Medication Sig  . atenolol (TENORMIN) 50 MG tablet Take 1 tablet (50 mg total) by mouth daily.  . calcium carbonate (OSCAL) 1500 (600 Ca) MG TABS tablet Take 600 mg of elemental calcium by mouth 2 (two) times daily with a meal.  . carboxymethylcellulose (REFRESH PLUS) 0.5 % SOLN 1 drop 4 (four) times daily.  . hydrocortisone cream 1 % Apply 1 application topically at bedtime. Apply to the chest  . Loteprednol Etabonate (LOTEMAX) 0.5 % GEL Apply 1 drop to eye as needed.  . Multiple Vitamin (MULTIVITAMIN WITH MINERALS) TABS Take 1 tablet by mouth every morning.  . Omega-3 Fatty Acids (FISH OIL) 1200 MG CAPS Take 1 capsule by mouth daily.   . potassium chloride (K-DUR,KLOR-CON) 10 MEQ tablet Take 10 mEq by mouth daily.  . predniSONE (DELTASONE) 1 MG tablet Take 3 mg by mouth daily.   Marland Kitchen  saccharomyces boulardii (FLORASTOR) 250 MG capsule Take 250 mg by mouth 2 (two) times daily.  . sertraline (ZOLOFT) 100 MG tablet TAKE 1 TABLET BY MOUTH EVERY DAY  . UNABLE TO FIND Med Name: 120 SAL AC 30% in Petrolatum cream. Apply to right and left cheek dark spots at bedtime.   No facility-administered encounter medications on file as of 08/22/2017.    ROS was provided with assistance of staff Review of Systems  Constitutional: Positive for fatigue. Negative for activity change, appetite change, chills, diaphoresis and fever.  HENT: Positive for congestion and  hearing loss. Negative for sore throat, trouble swallowing and voice change.   Eyes: Negative for visual disturbance.  Respiratory: Positive for cough and wheezing. Negative for apnea, choking and chest tightness.   Cardiovascular: Negative for chest pain, palpitations and leg swelling.  Gastrointestinal: Negative for constipation, diarrhea, nausea and vomiting.  Musculoskeletal: Positive for gait problem.  Skin: Negative for color change, pallor, rash and wound.  Neurological: Negative for tremors, speech difficulty, weakness and headaches.       Dementia  Psychiatric/Behavioral: Positive for confusion. Negative for agitation, behavioral problems, hallucinations and sleep disturbance. The patient is not nervous/anxious.     Immunization History  Administered Date(s) Administered  . Influenza Whole 05/28/2012  . Influenza, High Dose Seasonal PF 06/08/2016  . Influenza-Unspecified 05/28/2014, 08/28/2014, 05/27/2015, 06/18/2017  . Pneumococcal Conjugate-13 01/21/2014  . Pneumococcal Polysaccharide-23 04/16/2015  . Td 09/29/2003  . Zoster 01/14/2011   Pertinent  Health Maintenance Due  Topic Date Due  . INFLUENZA VACCINE  Completed  . DEXA SCAN  Completed  . PNA vac Low Risk Adult  Completed   Fall Risk  05/10/2017 05/01/2017 07/17/2016 06/09/2015 05/20/2015  Falls in the past year? No No Yes No No  Comment - - tripped over the last stair in the house - -  Number falls in past yr: - - 1 - -   Functional Status Survey:    Vitals:   08/22/17 1210  BP: 136/70  Pulse: 64  Resp: 18  Temp: 98.1 F (36.7 C)  SpO2: 94%  Weight: 175 lb 3.2 oz (79.5 kg)  Height: 5\' 4"  (1.626 m)   Body mass index is 30.07 kg/m. Physical Exam  Constitutional: She appears well-developed and well-nourished. No distress.  HENT:  Head: Normocephalic and atraumatic.  Mouth/Throat: Oropharynx is clear and moist. No oropharyngeal exudate.  Eyes: Conjunctivae and EOM are normal. Pupils are equal, round,  and reactive to light.  Neck: Normal range of motion. Neck supple. No JVD present.  Cardiovascular: Normal rate, regular rhythm and normal heart sounds.  No murmur heard. Pulmonary/Chest: Effort normal. No respiratory distress. She has wheezes. She has no rales. She exhibits no tenderness.  Mild expiratory wheezes anterior mid lungs.   Abdominal: Soft. Bowel sounds are normal.  Musculoskeletal: Normal range of motion. She exhibits no edema or tenderness.  Neurological: She is alert. She exhibits normal muscle tone. Coordination normal.  Oriented to person and place  Skin: Skin is warm and dry. She is not diaphoretic.  Psychiatric: She has a normal mood and affect. Her behavior is normal.    Labs reviewed: Recent Labs    04/26/17 08/23/17  NA 139 137  K 3.9 3.7  BUN 14 17  CREATININE 0.5 0.6   Recent Labs    04/26/17 08/23/17  AST 24 20  ALT 22 19  ALKPHOS 69 80   Recent Labs    04/26/17 08/23/17  WBC 7.1 9.4  HGB 14.6 14.0  HCT 43 40  PLT 209 220   Lab Results  Component Value Date   TSH 2.55 04/26/2017   Lab Results  Component Value Date   HGBA1C 5.3 04/26/2017   Lab Results  Component Value Date   CHOL 206 (A) 04/26/2017   HDL 52 04/26/2017   LDLCALC 132 04/26/2017   LDLDIRECT 173.0 08/07/2012   TRIG 108 04/26/2017   CHOLHDL 5 03/20/2016    Significant Diagnostic Results in last 30 days:  No results found.  Assessment/Plan Acute upper respiratory infection cough, malaise x 2days, wheezing cough, afebrile, no phlegm production, she denied chest pain, palpitation, SOB, no O2 desaturation.  Obtain CXR to r/o PNA, update CBC/diff, CMP, start Mucinex 600mg  po bid, DuroNeb q8h x 3 days, observe the patient.   Essential hypertension, benign Blood pressure is controlled, continue Atenolol 50mg  daily.      Family/ staff Communication: plan of care reviewed with the patient and charge nurse.   Labs/tests ordered:  CXR AP. CBC/diff, CMP  Time spend 25  minutes.

## 2017-08-22 NOTE — Assessment & Plan Note (Signed)
cough, malaise x 2days, wheezing cough, afebrile, no phlegm production, she denied chest pain, palpitation, SOB, no O2 desaturation.  Obtain CXR to r/o PNA, update CBC/diff, CMP, start Mucinex 600mg  po bid, DuroNeb q8h x 3 days, observe the patient.

## 2017-08-23 ENCOUNTER — Other Ambulatory Visit: Payer: Self-pay | Admitting: *Deleted

## 2017-08-23 LAB — HEPATIC FUNCTION PANEL
ALT: 19 (ref 7–35)
AST: 20 (ref 13–35)
Alkaline Phosphatase: 80 (ref 25–125)
BILIRUBIN, TOTAL: 0.5

## 2017-08-23 LAB — BASIC METABOLIC PANEL
BUN: 17 (ref 4–21)
Creatinine: 0.6 (ref <=1.1)
Glucose: 103
POTASSIUM: 3.7 (ref 3.4–5.3)
SODIUM: 137 (ref 137–147)

## 2017-08-23 LAB — CBC AND DIFFERENTIAL
HEMATOCRIT: 40 (ref 36–46)
HEMOGLOBIN: 14 (ref 12.0–16.0)
Platelets: 220 (ref 150–399)
WBC: 9.4

## 2017-08-24 ENCOUNTER — Encounter: Payer: Self-pay | Admitting: Internal Medicine

## 2017-08-24 ENCOUNTER — Non-Acute Institutional Stay: Payer: Medicare Other | Admitting: Internal Medicine

## 2017-08-24 DIAGNOSIS — F341 Dysthymic disorder: Secondary | ICD-10-CM | POA: Diagnosis not present

## 2017-08-24 DIAGNOSIS — E785 Hyperlipidemia, unspecified: Secondary | ICD-10-CM

## 2017-08-24 DIAGNOSIS — F0280 Dementia in other diseases classified elsewhere without behavioral disturbance: Secondary | ICD-10-CM

## 2017-08-24 DIAGNOSIS — M810 Age-related osteoporosis without current pathological fracture: Secondary | ICD-10-CM | POA: Diagnosis not present

## 2017-08-24 DIAGNOSIS — M353 Polymyalgia rheumatica: Secondary | ICD-10-CM

## 2017-08-24 DIAGNOSIS — G301 Alzheimer's disease with late onset: Secondary | ICD-10-CM | POA: Diagnosis not present

## 2017-08-24 DIAGNOSIS — E876 Hypokalemia: Secondary | ICD-10-CM

## 2017-08-24 DIAGNOSIS — I10 Essential (primary) hypertension: Secondary | ICD-10-CM | POA: Diagnosis not present

## 2017-08-24 DIAGNOSIS — F329 Major depressive disorder, single episode, unspecified: Secondary | ICD-10-CM | POA: Insufficient documentation

## 2017-08-24 NOTE — Progress Notes (Signed)
Location:  [facility] Room Number: [phone] Place of Service:  ALF [phone]) Provider:  Oneal GroutMahima Kierre Deines MD  Oneal GroutPandey, Kaedance Magos, MD  Patient Care Team: Oneal GroutPandey, Larayne Baxley, MD as PCP - General (Internal Medicine) Donnetta HailBeekman, James F, MD (Rheumatology) Ernesto RutherfordGroat, Robert, MD (Ophthalmology) Nita SellsHall, John, MD (Dermatology) Crist FatHerrick, Benjamin W, MD as Attending Physician (Urology) Mast, Man X, NP as Nurse Practitioner (Internal Medicine)  Extended Emergency Contact Information Primary Emergency Contact: McClanahan,Susan Address: [address]          [place], [place] [address] [place] Home Phone: [phone] Mobile Phone: [phone] Relation: Daughter Secondary Emergency Contact: Gardiner SleeperBaker,Carolanne  [place] Home Phone: [phone] Relation: Daughter  Code Status:  DNR  Goals of care: Advanced Directive information Advanced Directives 08/24/2017  Does Patient Have a Medical Advance Directive? Yes  Type of Advance Directive Out of facility DNR (pink MOST or yellow form)  Does patient want to make changes to medical advance directive? No - Patient declined  Copy of Healthcare Power of Attorney in Chart? -  Would patient like information on creating a medical advance directive? -  Pre-existing out of facility DNR order (yellow form or pink MOST form) Yellow form placed in chart (order not valid for inpatient use);Pink MOST form placed in chart (order not valid for inpatient use)     Chief Complaint  Patient presents with  . Medical Management of Chronic Issues    Routine Visit     HPI:  Pt is a 83 y.o. Mendez seen today for medical management of chronic diseases. She is pleasantly confused. She ambulates without assistive device. No fall reported. She was having cough and wheezing, following which she was seen by NP and placed on duoneb and mucinex. This has helped her. She remains afebrile. Appetite is fair per nursing. Chest xray resulted  showing no infiltrates per nursing.    Past Medical History:  Diagnosis Date  . Acute bronchitis 06/27/2015  . Alzheimer's dementia   . Cataract   . Cellulitis of left leg   . Depression   . Eczema   . High cholesterol   . History of renal stone    excision  . Hypertension   . Mild cognitive impairment   . Osteoarthritis   . Polymyalgia rheumatica (HCC)    rx with prednisone  . Urinary incontinence   . Varicose veins   . Xerosis of skin    Past Surgical History:  Procedure Laterality Date  . cataract surgery    . EYE SURGERY    . JOINT REPLACEMENT Bilateral    Dr. Trudee GripFrank Alusio  . kidney stone surgey    . lipoma removal      Allergies  Allergen Reactions  . Donepezil Other (See Comments)    BAD DREAMS  . Remeron [Mirtazapine] Other (See Comments)    Dizzy     Outpatient Encounter Medications as of 08/24/2017  Medication Sig  . acetaminophen (TYLENOL) 325 MG tablet Take 650 mg by mouth every 4 (four) hours as needed for moderate pain or headache.  Marland Kitchen. atenolol (TENORMIN) 50 MG tablet Take 1 tablet (50 mg total) by mouth daily.  . calcium carbonate (OSCAL) 1500 (600 Ca) MG TABS tablet Take 600 mg of elemental calcium by mouth 2 (two) times daily with a meal.  . carboxymethylcellulose (REFRESH PLUS) 0.5 % SOLN Place 1 drop into both eyes 4 (four) times daily.   . cholecalciferol (VITAMIN D) 1000 units tablet Take 1,000  Units by mouth daily.  . hydrocortisone cream 1 % Apply 1 application topically at bedtime. Apply to the chest  . Loteprednol Etabonate (LOTEMAX) 0.5 % GEL Apply 1 drop to eye as needed.  . Multiple Vitamin (MULTIVITAMIN WITH MINERALS) TABS Take 1 tablet by mouth every morning.  . Omega-3 Fatty Acids (FISH OIL) 1200 MG CAPS Take 1 capsule by mouth daily.   . potassium chloride (K-DUR,KLOR-CON) 10 MEQ tablet Take 10 mEq by mouth daily.  . predniSONE (DELTASONE) 1 MG tablet Take 3 mg by mouth daily.   . sertraline (ZOLOFT) 100 MG tablet TAKE 1 TABLET BY  MOUTH EVERY DAY  . UNABLE TO FIND Med Name: 120 SAL AC 30% in Petrolatum cream. Apply to right and left cheek dark spots at bedtime.  Marland Kitchen UNABLE TO FIND Med Name: Duoneb Liquid. Inhale every 8 hours  . [DISCONTINUED] saccharomyces boulardii (FLORASTOR) 250 MG capsule Take 250 mg by mouth 2 (two) times daily.   No facility-administered encounter medications on file as of 08/24/2017.     Review of Systems  Unable to perform ROS: Dementia (obtained from nursing mostly, limited HPI and ROS from patient)  Constitutional: Positive for fatigue. Negative for appetite change, chills, diaphoresis and fever.  HENT: Positive for hearing loss and rhinorrhea. Negative for congestion, ear discharge, ear pain, mouth sores, sore throat and trouble swallowing.        Clear nasal discharge per nursing   Eyes: Positive for visual disturbance.       Has corrective glasses   Respiratory: Positive for cough and wheezing. Negative for shortness of breath.        Cough subsided some, mostly dry  Cardiovascular: Negative for chest pain and palpitations.  Gastrointestinal: Negative for abdominal pain, nausea and vomiting.  Genitourinary: Negative for dysuria.  Musculoskeletal: Negative for arthralgias, back pain and gait problem.  Skin: Negative for rash and wound.  Neurological: Positive for light-headedness. Negative for tremors and headaches.  Psychiatric/Behavioral: Positive for confusion. Negative for behavioral problems.    Immunization History  Administered Date(s) Administered  . Influenza Whole 05/28/2012  . Influenza, High Dose Seasonal PF 06/08/2016  . Influenza-Unspecified 05/28/2014, 08/28/2014, 05/27/2015, 06/18/2017  . Pneumococcal Conjugate-13 01/21/2014  . Pneumococcal Polysaccharide-23 04/16/2015  . Td 09/29/2003  . Zoster 01/14/2011   Pertinent  Health Maintenance Due  Topic Date Due  . INFLUENZA VACCINE  Completed  . DEXA SCAN  Completed  . PNA vac Low Risk Adult  Completed   Fall  Risk  05/10/2017 05/01/2017 07/17/2016 06/09/2015 05/20/2015  Falls in the past year? No No Yes No No  Comment - - tripped over the last stair in the house - -  Number falls in past yr: - - 1 - -   Functional Status Survey:    Vitals:   08/24/17 1137  BP: 136/70  Pulse: 64  Resp: 18  Temp: 98.1 F (36.7 C)  TempSrc: Oral  SpO2: 94%  Weight: 175 lb 3.2 oz (79.5 kg)  Height: 5\' 4"  (1.626 m)   Body mass index is 30.07 kg/m.   Wt Readings from Last 3 Encounters:  08/24/17 175 lb 3.2 oz (79.5 kg)  08/22/17 175 lb 3.2 oz (79.5 kg)  05/31/17 174 lb (78.9 kg)   Physical Exam  Constitutional: No distress.  Obese elderly Mendez patient   HENT:  Head: Normocephalic and atraumatic.  Mouth/Throat: Oropharynx is clear and moist. No oropharyngeal exudate.  Eyes: Conjunctivae and EOM are normal. Pupils are equal, round, and  reactive to light. Right eye exhibits no discharge. Left eye exhibits no discharge.  Wears corrective glasses  Neck: Normal range of motion. Neck supple.  Cardiovascular: Normal rate and regular rhythm.  Pulmonary/Chest: Effort normal. No respiratory distress. She has no wheezes. She has no rales.  Decreased air movement  Abdominal: Soft. Bowel sounds are normal. There is no tenderness. There is no guarding.  Musculoskeletal: She exhibits edema and deformity.  Trace leg edema, able to move all 4 extremities  Lymphadenopathy:    She has no cervical adenopathy.  Neurological: She is alert.  Oriented to self and place only  Skin: Skin is warm and dry. She is not diaphoretic.  Psychiatric: She has a normal mood and affect.    Labs reviewed: Recent Labs    04/26/17 08/23/17  NA 139 137  K 3.9 3.7  BUN 14 17  CREATININE 0.5 0.6   Recent Labs    04/26/17 08/23/17  AST 24 20  ALT 22 19  ALKPHOS 69 80   Recent Labs    04/26/17 08/23/17  WBC 7.1 9.4  HGB 14.6 14.0  HCT 43 40  PLT 209 220   Lab Results  Component Value Date   TSH 2.55 04/26/2017   Lab  Results  Component Value Date   HGBA1C 5.3 04/26/2017   Lab Results  Component Value Date   CHOL 206 (A) 04/26/2017   HDL 52 04/26/2017   LDLCALC 132 04/26/2017   LDLDIRECT 173.0 08/07/2012   TRIG 108 04/26/2017   CHOLHDL 5 03/20/2016    Significant Diagnostic Results in last 30 days:  Chest xray 08/22/17 mild increased interstitial markings bilaterally compatible with chronic lung disease. No acute pulmonary infiltrate, no effusion or pneumothorax  Assessment/Plan  Osteoporosis Noted on chest xray. Currently on oscal 600 mg bid and vitamin d 1000 u daily. Stable vitamin d level of 60 on 08/17/17.   Late onset alzheimer's dementia No behavioral disturbance, currently on supportive care. Monitor  PMR Continue prednisone 3 mg daily. Stable.  Hypokalemia Continue kcl supplement. Stable k on review  Dyslipidemia Currently on fish oil. With her advanced age, will not place her on statin given limited benefit and her history of PMR. Lipid Panel     Component Value Date/Time   CHOL 206 (A) 04/26/2017   TRIG 108 04/26/2017   HDL 52 04/26/2017   CHOLHDL 5 03/20/2016 1229   VLDL 23.2 03/20/2016 1229   LDLCALC 132 04/26/2017   LDLDIRECT 173.0 08/07/2012 1305   Chronic depression Continue sertraline 100 mg daily and monitor her mood.   Hypertension Stable BP readings on review with SBP <140. Continue atenolol 50 mg daily  Her URI has improved, monitor clinically.   Family/ staff Communication: reviewed care plan with patient and charge nurse.    Labs/tests ordered:  none   Oneal GroutMAHIMA Karryn Kosinski, MD Internal Medicine [facility] [address] [place], [place] [address] Cell Phone (Monday-Friday 8 am - 5 pm): [phone] On Call: [phone] and follow prompts after 5 pm and on weekends Office Phone: [phone] Office Fax: [phone]

## 2017-10-31 ENCOUNTER — Ambulatory Visit: Payer: Medicare Other | Admitting: Neurology

## 2017-11-01 ENCOUNTER — Encounter: Payer: Self-pay | Admitting: Nurse Practitioner

## 2017-11-01 ENCOUNTER — Non-Acute Institutional Stay: Payer: Medicare Other | Admitting: Nurse Practitioner

## 2017-11-01 DIAGNOSIS — J069 Acute upper respiratory infection, unspecified: Secondary | ICD-10-CM | POA: Diagnosis not present

## 2017-11-01 DIAGNOSIS — F0280 Dementia in other diseases classified elsewhere without behavioral disturbance: Secondary | ICD-10-CM | POA: Diagnosis not present

## 2017-11-01 DIAGNOSIS — G301 Alzheimer's disease with late onset: Secondary | ICD-10-CM | POA: Diagnosis not present

## 2017-11-01 NOTE — Assessment & Plan Note (Signed)
nasal congestion, T 100.1 10/31/17, resolved today 11/01/17.  She has nasal congestion x2 days, she denied headache, facial pain/pressure, sore throat, cough, chest pain/pressure, palpitation, or aches/pains. She was noted watery eyes too. Will monitor vital signs x 3 days. Claritin 10mg  qd x3 days. Observe.

## 2017-11-01 NOTE — Assessment & Plan Note (Signed)
Her HPI was provided with assistance of her daughter and staff due to her dementia.

## 2017-11-01 NOTE — Progress Notes (Signed)
Location:   [facility] Room Number: [phone] Place of Service:  ALF [phone]) Provider: Arna Snipe Leonardo Makris NP  Oneal Grout, MD  Patient Care Team: Oneal Grout, MD as PCP - General (Internal Medicine) Donnetta Hail, MD (Rheumatology) Ernesto Rutherford, MD (Ophthalmology) Nita Sells, MD (Dermatology) Crist Fat, MD as Attending Physician (Urology) Sonnie Pawloski X, NP as Nurse Practitioner (Internal Medicine)  Extended Emergency Contact Information Primary Emergency Contact: McClanahan,Susan Address: [address] [place], [place] [address] [place] of [place] Home Phone: [phone] Mobile Phone: [phone] Relation: Daughter Secondary Emergency Contact: Gardiner Sleeper [place] of [place] Home Phone: [phone] Relation: Daughter  Code Status: DNR Goals of care: Advanced Directive information Advanced Directives 08/24/2017  Does Patient Have a Medical Advance Directive? Yes  Type of Advance Directive Out of facility DNR (pink MOST or yellow form)  Does patient want to make changes to medical advance directive? No - Patient declined  Copy of Healthcare Power of Attorney in Chart? -  Would patient like information on creating a medical advance directive? -  Pre-existing out of facility DNR order (yellow form or pink MOST form) Yellow form placed in chart (order not valid for inpatient use);Pink MOST form placed in chart (order not valid for inpatient use)     Chief Complaint  Patient presents with  . Acute Visit    congestion and runny nose    HPI:  Pt is a 84 y.o. female seen today for an acute visit for nasal congestion, T 100.1 10/31/17, resolved today 11/01/17. Her HPI was provided with assistance of her daughter and staff due to her dementia. She has nasal congestion x2 days, she denied headache, facial pain/pressure, sore throat, cough, chest pain/pressure, palpitation, or aches/pains.    Past Medical History:    Diagnosis Date  . Acute bronchitis 06/27/2015  . Alzheimer's dementia   . Cataract   . Cellulitis of left leg   . Depression   . Eczema   . High cholesterol   . History of renal stone    excision  . Hypertension   . Mild cognitive impairment   . Osteoarthritis   . Polymyalgia rheumatica (HCC)    rx with prednisone  . Urinary incontinence   . Varicose veins   . Xerosis of skin    Past Surgical History:  Procedure Laterality Date  . cataract surgery    . EYE SURGERY    . JOINT REPLACEMENT Bilateral    Dr. Trudee Grip  . kidney stone surgey    . lipoma removal      Allergies  Allergen Reactions  . Donepezil Other (See Comments)    BAD DREAMS  . Remeron [Mirtazapine] Other (See Comments)    Dizzy     Allergies as of 11/01/2017      Reactions   Donepezil Other (See Comments)   BAD DREAMS   Remeron [mirtazapine] Other (See Comments)   Dizzy       Medication List        Accurate as of 11/01/17  5:39 PM. Always use your most recent med list.          acetaminophen 325 MG tablet Commonly known as:  TYLENOL Take 650 mg by mouth every 4 (four) hours as needed for moderate pain or headache.   atenolol 50 MG tablet Commonly known as:  TENORMIN Take 1 tablet (50 mg total) by mouth daily.   calcium  carbonate 1500 (600 Ca) MG Tabs tablet Commonly known as:  OSCAL Take 600 mg of elemental calcium by mouth 2 (two) times daily with a meal.   carboxymethylcellulose 0.5 % Soln Commonly known as:  REFRESH PLUS Place 1 drop into both eyes 4 (four) times daily.   cholecalciferol 1000 units tablet Commonly known as:  VITAMIN D Take 1,000 Units by mouth daily.   Fish Oil 1200 MG Caps Take 1 capsule by mouth daily.   hydrocortisone cream 1 % Apply 1 application topically at bedtime. Apply to the chest   LOTEMAX 0.5 % Gel Generic drug:  Loteprednol Etabonate Apply 1 drop to eye as needed.   multivitamin with minerals Tabs tablet Take 1 tablet by mouth every  morning.   potassium chloride 10 MEQ tablet Commonly known as:  K-DUR,KLOR-CON Take 10 mEq by mouth daily.   predniSONE 1 MG tablet Commonly known as:  DELTASONE Take 3 mg by mouth daily.   sertraline 100 MG tablet Commonly known as:  ZOLOFT TAKE 1 TABLET BY MOUTH EVERY DAY   UNABLE TO FIND Med Name: 120 SAL AC 30% in Petrolatum cream. Apply to right and left cheek dark spots at bedtime.   UNABLE TO FIND Med Name: Duoneb Liquid. Inhale every 8 hours      ROS was provided with assistance of the patient's daughter and staff Review of Systems  Constitutional: Positive for fever. Negative for activity change, appetite change, chills, diaphoresis and fatigue.  HENT: Positive for congestion, hearing loss and rhinorrhea. Negative for facial swelling, mouth sores, nosebleeds, postnasal drip, sinus pressure, sinus pain, sneezing, sore throat and trouble swallowing.   Eyes: Negative for pain, discharge, redness, itching and visual disturbance.  Respiratory: Negative for cough, chest tightness, shortness of breath and wheezing.   Cardiovascular: Positive for leg swelling. Negative for chest pain and palpitations.  Neurological: Negative for dizziness, speech difficulty, weakness and headaches.       Dementia  Psychiatric/Behavioral: Positive for confusion. Negative for agitation and behavioral problems.    Immunization History  Administered Date(s) Administered  . Influenza Whole 05/28/2012  . Influenza, High Dose Seasonal PF 06/08/2016  . Influenza-Unspecified 05/28/2014, 08/28/2014, 05/27/2015, 06/18/2017  . Pneumococcal Conjugate-13 01/21/2014  . Pneumococcal Polysaccharide-23 04/16/2015  . Td 09/29/2003  . Zoster 01/14/2011   Pertinent  Health Maintenance Due  Topic Date Due  . INFLUENZA VACCINE  Completed  . DEXA SCAN  Completed  . PNA vac Low Risk Adult  Completed   Fall Risk  05/10/2017 05/01/2017 07/17/2016 06/09/2015 05/20/2015  Falls in the past year? No No Yes No No    Comment - - tripped over the last stair in the house - -  Number falls in past yr: - - 1 - -   Functional Status Survey:    Vitals:   11/01/17 1517  BP: 135/75  Pulse: 65  Resp: 18  Temp: (!) 97.1 F (36.2 C)  SpO2: 95%  Weight: 176 lb (79.8 kg)  Height: 5\' 4"  (1.626 m)   Body mass index is 30.21 kg/m. Physical Exam  Constitutional: She appears well-developed and well-nourished. No distress.  HENT:  Head: Normocephalic and atraumatic.  Mouth/Throat: Oropharynx is clear and moist. No oropharyngeal exudate.  Running nose  Eyes: Conjunctivae and EOM are normal. Pupils are equal, round, and reactive to light. Right eye exhibits no discharge. Left eye exhibits no discharge.  watery  Neck: Normal range of motion. Neck supple. No JVD present. No thyromegaly present.  Cardiovascular: Normal  rate, regular rhythm and normal heart sounds.  Pulmonary/Chest: Effort normal and breath sounds normal. She has no wheezes. She has no rales. She exhibits no tenderness.  Musculoskeletal: Normal range of motion. She exhibits edema.  Trace edema BLE  Neurological: She is alert.  Oriented to person and place.   Skin: Skin is warm and dry. She is not diaphoretic.  Psychiatric: She has a normal mood and affect.    Labs reviewed: Recent Labs    04/26/17 08/23/17  NA 139 137  K 3.9 3.7  BUN 14 17  CREATININE 0.5 0.6   Recent Labs    04/26/17 08/23/17  AST 24 20  ALT 22 19  ALKPHOS 69 80   Recent Labs    04/26/17 08/23/17  WBC 7.1 9.4  HGB 14.6 14.0  HCT 43 40  PLT 209 220   Lab Results  Component Value Date   TSH 2.55 04/26/2017   Lab Results  Component Value Date   HGBA1C 5.3 04/26/2017   Lab Results  Component Value Date   CHOL 206 (A) 04/26/2017   HDL 52 04/26/2017   LDLCALC 132 04/26/2017   LDLDIRECT 173.0 08/07/2012   TRIG 108 04/26/2017   CHOLHDL 5 03/20/2016    Significant Diagnostic Results in last 30 days:  No results found.  Assessment/Plan: Acute  upper respiratory infection nasal congestion, T 100.1 10/31/17, resolved today 11/01/17.  She has nasal congestion x2 days, she denied headache, facial pain/pressure, sore throat, cough, chest pain/pressure, palpitation, or aches/pains. She was noted watery eyes too. Will monitor vital signs x 3 days. Claritin 10mg  qd x3 days. Observe.    Late onset Alzheimer's disease without behavioral disturbance Her HPI was provided with assistance of her daughter and staff due to her dementia.     Family/ staff Communication: plan of care reviewed with the patient and charge nurse.   Labs/tests ordered:  None  Time spend 25 minutes.

## 2017-11-22 ENCOUNTER — Non-Acute Institutional Stay: Payer: Medicare Other | Admitting: Nurse Practitioner

## 2017-11-22 ENCOUNTER — Encounter: Payer: Self-pay | Admitting: Nurse Practitioner

## 2017-11-22 DIAGNOSIS — F0280 Dementia in other diseases classified elsewhere without behavioral disturbance: Secondary | ICD-10-CM

## 2017-11-22 DIAGNOSIS — G301 Alzheimer's disease with late onset: Secondary | ICD-10-CM | POA: Diagnosis not present

## 2017-11-22 DIAGNOSIS — I10 Essential (primary) hypertension: Secondary | ICD-10-CM | POA: Diagnosis not present

## 2017-11-22 DIAGNOSIS — E876 Hypokalemia: Secondary | ICD-10-CM | POA: Diagnosis not present

## 2017-11-22 NOTE — Assessment & Plan Note (Signed)
The patient has history of dementia, resides in [facility], ambulates with walker. Hx of intolerance of Donepezil. Last MMSE 20/30 05/04/17. Will try Memantine starter pk, then 28mg  po daily. Observe.

## 2017-11-22 NOTE — Assessment & Plan Note (Signed)
Blood pressure is controlled, continue Atenolol 50mg  po daily

## 2017-11-22 NOTE — Assessment & Plan Note (Signed)
Last K 3.7 08/23/17, will dc Kcl, f/u BMP 2 weeks.

## 2017-11-22 NOTE — Progress Notes (Signed)
Location:  [facility] Room Number: [id number] Place of Service:  ALF [phone]) Provider:  Deundre Thong, Manxie  NP  Oneal Grout, MD  Patient Care Team: Oneal Grout, MD as PCP - General (Internal Medicine) Donnetta Hail, MD (Rheumatology) Ernesto Rutherford, MD (Ophthalmology) Nita Sells, MD (Dermatology) Crist Fat, MD as Attending Physician (Urology) Deionna Marcantonio X, NP as Nurse Practitioner (Internal Medicine)  Extended Emergency Contact Information Primary Emergency Contact: McClanahan,Susan Address: [address] [place], [place] [address] [place] of [place] Home Phone: [phone] Mobile Phone: [phone] Relation: Daughter Secondary Emergency Contact: Gardiner Sleeper [place] Home Phone: [phone] Relation: Daughter  Code Status:  DNR Goals of care: Advanced Directive information Advanced Directives 08/24/2017  Does Patient Have a Medical Advance Directive? Yes  Type of Advance Directive Out of facility DNR (pink MOST or yellow form)  Does patient want to make changes to medical advance directive? No - Patient declined  Copy of Healthcare Power of Attorney in Chart? -  Would patient like information on creating a medical advance directive? -  Pre-existing out of facility DNR order (yellow form or pink MOST form) Yellow form placed in chart (order not valid for inpatient use);Pink MOST form placed in chart (order not valid for inpatient use)     Chief Complaint  Patient presents with  . Medical Management of Chronic Issues    F/U - Upper resp.infection,     HPI:  Pt is a 84 y.o. female seen today for medical management of chronic diseases.    The patient has history of dementia, resides in [facility], ambulates with walker. Hx of intolerance of Donepezil. Last MMSE 20/30 05/04/17. Her mood is stable on Sertraline 100mg  po daily. HTN, blood pressure is controlled on Atenolol 50mg  po daily.   Past Medical  History:  Diagnosis Date  . Acute bronchitis 06/27/2015  . Alzheimer's dementia   . Cataract   . Cellulitis of left leg   . Depression   . Eczema   . High cholesterol   . History of renal stone    excision  . Hypertension   . Mild cognitive impairment   . Osteoarthritis   . Polymyalgia rheumatica (HCC)    rx with prednisone  . Urinary incontinence   . Varicose veins   . Xerosis of skin    Past Surgical History:  Procedure Laterality Date  . cataract surgery    . EYE SURGERY    . JOINT REPLACEMENT Bilateral    Dr. Trudee Grip  . kidney stone surgey    . lipoma removal      Allergies  Allergen Reactions  . Donepezil Other (See Comments)    BAD DREAMS  . Remeron [Mirtazapine] Other (See Comments)    Dizzy     Outpatient Encounter Medications as of 11/22/2017  Medication Sig  . acetaminophen (TYLENOL) 325 MG tablet Take 650 mg by mouth every 4 (four) hours as needed for moderate pain or headache.  Marland Kitchen atenolol (TENORMIN) 50 MG tablet Take 1 tablet (50 mg total) by mouth daily.  . calcium carbonate (OSCAL) 1500 (600 Ca) MG TABS tablet Take 600 mg of elemental calcium by mouth 2 (two) times daily with a meal.  . carboxymethylcellulose (REFRESH PLUS) 0.5 % SOLN Place 1 drop into both eyes 4 (four) times daily.   . cholecalciferol (VITAMIN D) 1000 units tablet Take 1,000 Units by mouth daily.  Marland Kitchen  hydrocortisone cream 1 % Apply 1 application topically at bedtime. Apply to the chest  . Loteprednol Etabonate (LOTEMAX) 0.5 % GEL Apply 1 drop to eye as needed.  . Multiple Vitamin (MULTIVITAMIN WITH MINERALS) TABS Take 1 tablet by mouth every morning.  . Omega-3 Fatty Acids (FISH OIL) 1200 MG CAPS Take 1 capsule by mouth daily.   . potassium chloride (K-DUR,KLOR-CON) 10 MEQ tablet Take 10 mEq by mouth daily.  . predniSONE (DELTASONE) 1 MG tablet Take 3 mg by mouth daily.   . sertraline (ZOLOFT) 100 MG tablet TAKE 1 TABLET BY MOUTH EVERY DAY  . UNABLE TO FIND Med Name: 120 SAL AC  30% in Petrolatum cream. Apply to right and left cheek dark spots at bedtime.  . [DISCONTINUED] UNABLE TO FIND Med Name: Duoneb Liquid. Inhale every 8 hours   No facility-administered encounter medications on file as of 11/22/2017.    ROS was provided with assistance of staff Review of Systems  Constitutional: Negative for activity change, appetite change, chills, diaphoresis, fatigue and fever.  HENT: Positive for hearing loss. Negative for congestion and trouble swallowing.   Respiratory: Negative for cough, shortness of breath and wheezing.   Cardiovascular: Positive for leg swelling. Negative for chest pain and palpitations.  Gastrointestinal: Negative for abdominal distention, abdominal pain, constipation, diarrhea, nausea and vomiting.  Genitourinary: Negative for difficulty urinating, dysuria and urgency.       Bathroom trips average 2x/night.   Musculoskeletal: Positive for gait problem.  Skin: Negative for color change and pallor.  Neurological: Negative for speech difficulty, weakness and headaches.       Dementia  Psychiatric/Behavioral: Positive for confusion. Negative for agitation, behavioral problems, hallucinations and sleep disturbance.    Immunization History  Administered Date(s) Administered  . Influenza Whole 05/28/2012  . Influenza, High Dose Seasonal PF 06/08/2016  . Influenza-Unspecified 05/28/2014, 08/28/2014, 05/27/2015, 06/18/2017  . Pneumococcal Conjugate-13 01/21/2014  . Pneumococcal Polysaccharide-23 04/16/2015  . Td 09/29/2003  . Zoster 01/14/2011   Pertinent  Health Maintenance Due  Topic Date Due  . INFLUENZA VACCINE  Completed  . DEXA SCAN  Completed  . PNA vac Low Risk Adult  Completed   Fall Risk  05/10/2017 05/01/2017 07/17/2016 06/09/2015 05/20/2015  Falls in the past year? No No Yes No No  Comment - - tripped over the last stair in the house - -  Number falls in past yr: - - 1 - -   Functional Status Survey:    Vitals:   11/22/17 1057    BP: 134/80  Pulse: 76  Resp: 18  Temp: 97.7 F (36.5 C)  SpO2: 98%  Weight: 176 lb (79.8 kg)  Height: 5\' 4"  (1.626 m)   Body mass index is 30.21 kg/m. Physical Exam  Constitutional: She appears well-developed and well-nourished.  HENT:  Head: Normocephalic and atraumatic.  Eyes: Pupils are equal, round, and reactive to light. Conjunctivae and EOM are normal.  Neck: Normal range of motion. Neck supple. No JVD present. No thyromegaly present.  Cardiovascular: Normal rate and regular rhythm.  No murmur heard. Pulmonary/Chest: Effort normal and breath sounds normal. She has no wheezes. She has no rales.  Abdominal: Soft. Bowel sounds are normal. She exhibits no distension. There is no tenderness.  Musculoskeletal: Normal range of motion. She exhibits edema. She exhibits no tenderness.  Self transfer and toileting, ambulates with walker. Trace edema in ankles.   Neurological: She is alert. Coordination normal.  Oriented to person and place.   Skin: Skin is  warm and dry. No rash noted. No erythema.  Psychiatric: She has a normal mood and affect. Her behavior is normal.    Labs reviewed: Recent Labs    04/26/17 08/23/17  NA 139 137  K 3.9 3.7  BUN 14 17  CREATININE 0.5 0.6   Recent Labs    04/26/17 08/23/17  AST 24 20  ALT 22 19  ALKPHOS 69 80   Recent Labs    04/26/17 08/23/17  WBC 7.1 9.4  HGB 14.6 14.0  HCT 43 40  PLT 209 220   Lab Results  Component Value Date   TSH 2.55 04/26/2017   Lab Results  Component Value Date   HGBA1C 5.3 04/26/2017   Lab Results  Component Value Date   CHOL 206 (A) 04/26/2017   HDL 52 04/26/2017   LDLCALC 132 04/26/2017   LDLDIRECT 173.0 08/07/2012   TRIG 108 04/26/2017   CHOLHDL 5 03/20/2016    Significant Diagnostic Results in last 30 days:  No results found.  Assessment/Plan Late onset Alzheimer's disease without behavioral disturbance The patient has history of dementia, resides in [facility], ambulates with walker. Hx  of intolerance of Donepezil. Last MMSE 20/30 05/04/17. Will try Memantine starter pk, then 28mg  po daily. Observe.    Hypokalemia Last K 3.7 08/23/17, will dc Kcl, f/u BMP 2 weeks.   Essential hypertension, benign Blood pressure is controlled, continue Atenolol 50mg  po daily    Family/ staff Communication: plan of care reviewed with the patient and charge nurse.   Labs/tests ordered: BMP 2 weeks.   Time spend 25 minutes.

## 2017-11-29 ENCOUNTER — Other Ambulatory Visit: Payer: Self-pay | Admitting: *Deleted

## 2017-11-29 DIAGNOSIS — N390 Urinary tract infection, site not specified: Secondary | ICD-10-CM

## 2017-11-29 LAB — BASIC METABOLIC PANEL
BUN: 21 (ref 4–21)
CREATININE: 0.6 (ref <=1.1)
Glucose: 94
Potassium: 4 (ref 3.4–5.3)
SODIUM: 140 (ref 137–147)

## 2017-12-17 ENCOUNTER — Non-Acute Institutional Stay: Payer: Medicare Other | Admitting: Nurse Practitioner

## 2017-12-17 ENCOUNTER — Encounter: Payer: Self-pay | Admitting: Nurse Practitioner

## 2017-12-17 DIAGNOSIS — I10 Essential (primary) hypertension: Secondary | ICD-10-CM | POA: Diagnosis not present

## 2017-12-17 DIAGNOSIS — G301 Alzheimer's disease with late onset: Secondary | ICD-10-CM | POA: Diagnosis not present

## 2017-12-17 DIAGNOSIS — R300 Dysuria: Secondary | ICD-10-CM | POA: Diagnosis not present

## 2017-12-17 DIAGNOSIS — F0280 Dementia in other diseases classified elsewhere without behavioral disturbance: Secondary | ICD-10-CM

## 2017-12-17 DIAGNOSIS — E876 Hypokalemia: Secondary | ICD-10-CM

## 2017-12-17 DIAGNOSIS — R42 Dizziness and giddiness: Secondary | ICD-10-CM | POA: Diagnosis not present

## 2017-12-17 NOTE — Progress Notes (Addendum)
Location:  [facility] Room Number: [phone] Place of Service:  ALF [phone]) Provider:  Linkin Vizzini, Arna Snipe  NP  Oneal Grout, MD  Patient Care Team: Oneal Grout, MD as PCP - General (Internal Medicine) Donnetta Hail, MD (Rheumatology) Ernesto Rutherford, MD (Ophthalmology) Nita Sells, MD (Dermatology) Crist Fat, MD as Attending Physician (Urology) Desean Heemstra X, NP as Nurse Practitioner (Internal Medicine)  Extended Emergency Contact Information Primary Emergency Contact: McClanahan,Susan Address: [address] [place], [place] [address] [place] of [place] Home Phone: [phone] Mobile Phone: [phone] Relation: Daughter Secondary Emergency Contact: Gardiner Sleeper [place] of [place] Home Phone: [phone] Relation: Daughter  Code Status:  DNR Goals of care: Advanced Directive information Advanced Directives 08/24/2017  Does Patient Have a Medical Advance Directive? Yes  Type of Advance Directive Out of facility DNR (pink MOST or yellow form)  Does patient want to make changes to medical advance directive? No - Patient declined  Copy of Healthcare Power of Attorney in Chart? -  Would patient like information on creating a medical advance directive? -  Pre-existing out of facility DNR order (yellow form or pink MOST form) Yellow form placed in chart (order not valid for inpatient use);Pink MOST form placed in chart (order not valid for inpatient use)     Chief Complaint  Patient presents with  . Acute Visit    ? UTI and medication change    HPI:  Pt is a 84 y.o. female seen today for an acute visit for burning sensation upon urination, c/o urinary urgency, increased urinary frequency and incontinence x 2 days. She is afebrile, denied nausea, vomiting, or abd/lower back pain. HPI was provided with assistance of staff and the patient's daughter due to her dementia. The patient's daughter requested to discontinue  Menantine started 11/22/17 due to c/o intolerance: dizziness, bad dreams.    Past Medical History:  Diagnosis Date  . Acute bronchitis 06/27/2015  . Alzheimer's dementia   . Cataract   . Cellulitis of left leg   . Depression   . Eczema   . High cholesterol   . History of renal stone    excision  . Hypertension   . Mild cognitive impairment   . Osteoarthritis   . Polymyalgia rheumatica (HCC)    rx with prednisone  . Urinary incontinence   . Varicose veins   . Xerosis of skin    Past Surgical History:  Procedure Laterality Date  . cataract surgery    . EYE SURGERY    . JOINT REPLACEMENT Bilateral    Dr. Trudee Grip  . kidney stone surgey    . lipoma removal      Allergies  Allergen Reactions  . Donepezil Other (See Comments)    BAD DREAMS  . Remeron [Mirtazapine] Other (See Comments)    Dizzy     Outpatient Encounter Medications as of 12/17/2017  Medication Sig  . atenolol (TENORMIN) 50 MG tablet Take 1 tablet (50 mg total) by mouth daily.  . calcium carbonate (OSCAL) 1500 (600 Ca) MG TABS tablet Take 600 mg of elemental calcium by mouth 2 (two) times daily with a meal.  . carboxymethylcellulose (REFRESH PLUS) 0.5 % SOLN Place 1 drop into both eyes 4 (four) times daily.   . cholecalciferol (VITAMIN D) 1000 units tablet Take 1,000 Units by mouth daily.  . hydrocortisone cream 1 % Apply 1 application topically at bedtime. Apply to the chest  .  Loteprednol Etabonate (LOTEMAX) 0.5 % GEL Apply 1 drop to eye as needed.  . Multiple Vitamin (MULTIVITAMIN WITH MINERALS) TABS Take 1 tablet by mouth every morning.  . Omega-3 Fatty Acids (FISH OIL) 1200 MG CAPS Take 1 capsule by mouth daily.   . predniSONE (DELTASONE) 1 MG tablet Take 3 mg by mouth daily.   . sertraline (ZOLOFT) 100 MG tablet TAKE 1 TABLET BY MOUTH EVERY DAY  . UNABLE TO FIND Med Name: 120 SAL AC 30% in Petrolatum cream. Apply to right and left cheek dark spots at bedtime.  . [DISCONTINUED] acetaminophen  (TYLENOL) 325 MG tablet Take 650 mg by mouth every 4 (four) hours as needed for moderate pain or headache.  . [DISCONTINUED] potassium chloride (K-DUR,KLOR-CON) 10 MEQ tablet Take 10 mEq by mouth daily.   No facility-administered encounter medications on file as of 12/17/2017.    ROS was provided with assistance of staff Review of Systems  Constitutional: Negative for activity change, appetite change, chills, diaphoresis, fatigue and fever.  HENT: Positive for hearing loss. Negative for congestion, trouble swallowing and voice change.   Respiratory: Negative for cough, shortness of breath and wheezing.   Cardiovascular: Negative for chest pain, palpitations and leg swelling.  Gastrointestinal: Negative for abdominal distention, abdominal pain, constipation, diarrhea, nausea and vomiting.  Genitourinary: Positive for dysuria, frequency and urgency. Negative for difficulty urinating.       Urgency, incontinent episodes.   Musculoskeletal: Positive for gait problem.  Skin: Negative for wound.       Brownish changes of BLE  Neurological: Positive for dizziness. Negative for speech difficulty, weakness and headaches.       Dementia  Psychiatric/Behavioral: Positive for confusion. Negative for agitation, behavioral problems, hallucinations and sleep disturbance. The patient is not nervous/anxious.     Immunization History  Administered Date(s) Administered  . Influenza Whole 05/28/2012  . Influenza, High Dose Seasonal PF 06/08/2016  . Influenza-Unspecified 05/28/2014, 08/28/2014, 05/27/2015, 06/18/2017  . Pneumococcal Conjugate-13 01/21/2014  . Pneumococcal Polysaccharide-23 04/16/2015  . Td 09/29/2003  . Zoster 01/14/2011   Pertinent  Health Maintenance Due  Topic Date Due  . INFLUENZA VACCINE  03/28/2018  . DEXA SCAN  Completed  . PNA vac Low Risk Adult  Completed   Fall Risk  05/10/2017 05/01/2017 07/17/2016 06/09/2015 05/20/2015  Falls in the past year? No No Yes No No  Comment - -  tripped over the last stair in the house - -  Number falls in past yr: - - 1 - -   Functional Status Survey:    Vitals:   12/17/17 1151  BP: (!) 150/80  Pulse: (!) 52  Resp: 18  Temp: (!) 97.2 F (36.2 C)  SpO2: 95%  Weight: 174 lb (78.9 kg)  Height: 5\' 4"  (1.626 m)   Body mass index is 29.87 kg/m. Physical Exam  Constitutional: She appears well-developed and well-nourished. No distress.  HENT:  Head: Normocephalic and atraumatic.  Eyes: Pupils are equal, round, and reactive to light. EOM are normal.  Neck: Normal range of motion. Neck supple. No JVD present. No thyromegaly present.  Cardiovascular: Normal rate and regular rhythm.  No murmur heard. Pulmonary/Chest: She has no wheezes. She has no rales.  Abdominal: Soft. Bowel sounds are normal. She exhibits no distension. There is no tenderness. There is no rebound and no guarding.  Musculoskeletal: She exhibits no edema.  Ambulates with walker.   Neurological: No cranial nerve deficit. She exhibits normal muscle tone. Coordination normal.  Oriented to person  and place.   Skin: Skin is warm and dry. She is not diaphoretic.  Brownish pigmented BLE  Psychiatric: She has a normal mood and affect. Her behavior is normal.    Labs reviewed: Recent Labs    04/26/17 08/23/17 11/29/17  NA 139 137 140  K 3.9 3.7 4.0  BUN 14 17 21   CREATININE 0.5 0.6 0.6   Recent Labs    04/26/17 08/23/17  AST 24 20  ALT 22 19  ALKPHOS 69 80   Recent Labs    04/26/17 08/23/17 12/18/17  WBC 7.1 9.4 7.5  HGB 14.6 14.0 14.2  HCT 43 40 42  PLT 209 220 219   Lab Results  Component Value Date   TSH 2.55 04/26/2017   Lab Results  Component Value Date   HGBA1C 5.3 04/26/2017   Lab Results  Component Value Date   CHOL 206 (A) 04/26/2017   HDL 52 04/26/2017   LDLCALC 132 04/26/2017   LDLDIRECT 173.0 08/07/2012   TRIG 108 04/26/2017   CHOLHDL 5 03/20/2016    Significant Diagnostic Results in last 30 days:  No results  found.  Assessment/Plan UTI (urinary tract infection) burning sensation upon urination, c/o urinary urgency, increased urinary frequency and incontinence x 2 days. She is afebrile, denied nausea, vomiting, or abd/lower back pain. Will obtain UA C/S, CBC/diff.  12/18/17 urine culture E coli, 7 day course of Nitrofurantoin 100mg  bid 12/20/17  Alzheimer's disease HPI was provided with assistance of staff and the patient's daughter due to her dementia. The patient's daughter requested to discontinue Menantine started 11/22/17 due to c/o intolerance: dizziness, bad dreams. Continue AL for care assistance, close supervision for safety, observe the patient.    Hypokalemia Off Kcl 11/22/17, serum K 4.0 11/29/17.   Dizziness No dizziness today, but the patient's daughter reported "dizziness/bad dreams" associated with Memantine use, off Memantine, observe the patient.   Essential hypertension, benign Mildly elevated Sbp in 150s, she denied headache, dizziness, chest pain/pressure, palpitation, SOB, nausea, vomiting, or change of vision. Will continue Atenolol, observe the patient, monitor Bp/P     Family/ staff Communication: plan of care reviewed with the patient, the patient's daughter, and charge nurse Labs/tests ordered:  UA C/S, CBC/diff  Time spend 25 minutes.

## 2017-12-17 NOTE — Assessment & Plan Note (Signed)
No dizziness today, but the patient's daughter reported "dizziness/bad dreams" associated with Memantine use, off Memantine, observe the patient.

## 2017-12-17 NOTE — Assessment & Plan Note (Signed)
HPI was provided with assistance of staff and the patient's daughter due to her dementia. The patient's daughter requested to discontinue Menantine started 11/22/17 due to c/o intolerance: dizziness, bad dreams. Continue AL for care assistance, close supervision for safety, observe the patient.

## 2017-12-17 NOTE — Assessment & Plan Note (Signed)
Off Kcl 11/22/17, serum K 4.0 11/29/17.

## 2017-12-17 NOTE — Assessment & Plan Note (Addendum)
burning sensation upon urination, c/o urinary urgency, increased urinary frequency and incontinence x 2 days. She is afebrile, denied nausea, vomiting, or abd/lower back pain. Will obtain UA C/S, CBC/diff.  12/18/17 urine culture E coli, 7 day course of Nitrofurantoin 100mg  bid 12/20/17

## 2017-12-18 ENCOUNTER — Other Ambulatory Visit: Payer: Self-pay | Admitting: *Deleted

## 2017-12-18 LAB — CBC AND DIFFERENTIAL
HEMATOCRIT: 42 (ref 36–46)
HEMOGLOBIN: 14.2 (ref 12.0–16.0)
Platelets: 219 (ref 150–399)
WBC: 7.5

## 2017-12-19 NOTE — Assessment & Plan Note (Signed)
Mildly elevated Sbp in 150s, she denied headache, dizziness, chest pain/pressure, palpitation, SOB, nausea, vomiting, or change of vision. Will continue Atenolol, observe the patient, monitor Bp/P

## 2017-12-20 NOTE — Addendum Note (Signed)
Addended by: Leston Schueller X on: 12/20/2017 11:56 AM   Modules accepted: Level of Service, SmartSet

## 2017-12-20 NOTE — Progress Notes (Signed)
This encounter was created in error - please disregard.

## 2018-01-18 ENCOUNTER — Non-Acute Institutional Stay: Payer: Medicare Other | Admitting: Nurse Practitioner

## 2018-01-18 ENCOUNTER — Encounter: Payer: Self-pay | Admitting: Nurse Practitioner

## 2018-01-18 DIAGNOSIS — R531 Weakness: Secondary | ICD-10-CM | POA: Diagnosis not present

## 2018-01-18 DIAGNOSIS — R519 Headache, unspecified: Secondary | ICD-10-CM

## 2018-01-18 DIAGNOSIS — F0280 Dementia in other diseases classified elsewhere without behavioral disturbance: Secondary | ICD-10-CM | POA: Diagnosis not present

## 2018-01-18 DIAGNOSIS — J069 Acute upper respiratory infection, unspecified: Secondary | ICD-10-CM | POA: Diagnosis not present

## 2018-01-18 DIAGNOSIS — R51 Headache: Secondary | ICD-10-CM | POA: Diagnosis not present

## 2018-01-18 DIAGNOSIS — G301 Alzheimer's disease with late onset: Secondary | ICD-10-CM

## 2018-01-18 LAB — CBC AND DIFFERENTIAL
HCT: 47 — AB (ref 36–46)
Hemoglobin: 15.8 (ref 12.0–16.0)
Platelets: 236 (ref 150–399)
WBC: 9.4

## 2018-01-18 LAB — BASIC METABOLIC PANEL
BUN: 18 (ref 4–21)
Creatinine: 0.7 (ref <=1.1)
GLUCOSE: 111
Potassium: 3.9 (ref 3.4–5.3)
Sodium: 139 (ref 137–147)

## 2018-01-18 LAB — HEPATIC FUNCTION PANEL
ALT: 19 (ref 7–35)
AST: 22 (ref 13–35)
Alkaline Phosphatase: 75 (ref 25–125)
BILIRUBIN, TOTAL: 0.6

## 2018-01-18 NOTE — Progress Notes (Signed)
Location:  [facility] Room Number: [phone] Place of Service:  ALF [phone]) Provider:  Nitya Cauthon, Arna Snipe  NP  Oneal Grout, MD  Patient Care Team: Oneal Grout, MD as PCP - General (Internal Medicine) Donnetta Hail, MD (Rheumatology) Ernesto Rutherford, MD (Ophthalmology) Nita Sells, MD (Dermatology) Crist Fat, MD as Attending Physician (Urology) Angeldejesus Callaham X, NP as Nurse Practitioner (Internal Medicine)  Extended Emergency Contact Information Primary Emergency Contact: McClanahan,Susan Address: [address] [place], [place] [address] [place] of [place] Home Phone: [phone] Mobile Phone: [phone] Relation: Daughter Secondary Emergency Contact: Gardiner Sleeper [place] of [place] Home Phone: [phone] Relation: Daughter  Code Status:  DNR Goals of care: Advanced Directive information Advanced Directives 01/18/2018  Does Patient Have a Medical Advance Directive? Yes  Type of Advance Directive Out of facility DNR (pink MOST or yellow form)  Does patient want to make changes to medical advance directive? No - Patient declined  Copy of Healthcare Power of Attorney in Chart? -  Would patient like information on creating a medical advance directive? -  Pre-existing out of facility DNR order (yellow form or pink MOST form) Pink MOST form placed in chart (order not valid for inpatient use)     Chief Complaint  Patient presents with  . Acute Visit    C/o-unsteady gait, feeling fuzzy headed, since yesterday.    HPI:  Pt is a 84 y.o. female seen today for an acute visit for headache/dizziness, running nose, facial pressure, hoarseness, generalized weakness, unsteady gait, and cough x 2 days, afebrile, no O2 desaturation. No focal neurological symptoms upon my examination. She denied change of vision, chest pain or pressure, palpitation, nausea, vomiting, abd pain. HPI was provided with assistance of staff due to her  dementia. She resides in [facility], ambulates with walker, not on memory preserving meds.    Past Medical History:  Diagnosis Date  . Acute bronchitis 06/27/2015  . Alzheimer's dementia   . Cataract   . Cellulitis of left leg   . Depression   . Eczema   . High cholesterol   . History of renal stone    excision  . Hypertension   . Mild cognitive impairment   . Osteoarthritis   . Polymyalgia rheumatica (HCC)    rx with prednisone  . Urinary incontinence   . Varicose veins   . Xerosis of skin    Past Surgical History:  Procedure Laterality Date  . cataract surgery    . EYE SURGERY    . JOINT REPLACEMENT Bilateral    Dr. Trudee Grip  . kidney stone surgey    . lipoma removal      Allergies  Allergen Reactions  . Donepezil Other (See Comments)    BAD DREAMS  . Remeron [Mirtazapine] Other (See Comments)    Dizzy     Outpatient Encounter Medications as of 01/18/2018  Medication Sig  . acetaminophen (TYLENOL) 325 MG tablet Take 650 mg by mouth every 4 (four) hours as needed for headache.  Marland Kitchen atenolol (TENORMIN) 50 MG tablet Take 1 tablet (50 mg total) by mouth daily.  . calcium carbonate (OSCAL) 1500 (600 Ca) MG TABS tablet Take 600 mg of elemental calcium by mouth 2 (two) times daily with a meal.  . carboxymethylcellulose (REFRESH PLUS) 0.5 % SOLN Place 1 drop into both eyes 4 (four) times daily.   . cholecalciferol (VITAMIN D) 1000 units tablet Take 1,000  Units by mouth daily.  . hydrocortisone cream 1 % Apply 1 application topically at bedtime. Apply to the chest  . Omega-3 Fatty Acids (FISH OIL) 1200 MG CAPS Take 1 capsule by mouth daily.   . predniSONE (DELTASONE) 1 MG tablet Take 3 mg by mouth daily.   . sertraline (ZOLOFT) 100 MG tablet TAKE 1 TABLET BY MOUTH EVERY DAY  . UNABLE TO FIND Med Name: 120 SAL AC 30% in Petrolatum cream. Apply to right and left cheek dark spots at bedtime.  . [DISCONTINUED] Loteprednol Etabonate (LOTEMAX) 0.5 % GEL Apply 1 drop to eye as  needed.  . [DISCONTINUED] Multiple Vitamin (MULTIVITAMIN WITH MINERALS) TABS Take 1 tablet by mouth every morning.   No facility-administered encounter medications on file as of 01/18/2018.    ROS was provided with assistance of staff Review of Systems  Constitutional: Positive for activity change and fatigue. Negative for appetite change, chills, diaphoresis and fever.  HENT: Positive for congestion, hearing loss, rhinorrhea, sinus pressure and voice change. Negative for drooling, ear pain, facial swelling, mouth sores, nosebleeds, postnasal drip, sinus pain, sneezing, sore throat, tinnitus and trouble swallowing.   Eyes: Negative for pain, discharge, redness and itching.  Respiratory: Positive for cough. Negative for shortness of breath and wheezing.   Cardiovascular: Negative for chest pain and leg swelling.  Gastrointestinal: Negative for abdominal distention, abdominal pain, constipation, diarrhea and nausea.  Genitourinary: Negative for difficulty urinating, dysuria and urgency.  Musculoskeletal: Positive for gait problem.  Neurological: Positive for dizziness, light-headedness and headaches. Negative for tremors, facial asymmetry, speech difficulty, weakness and numbness.       Dementia  Psychiatric/Behavioral: Positive for confusion. Negative for agitation and behavioral problems.    Immunization History  Administered Date(s) Administered  . Influenza Whole 05/28/2012  . Influenza, High Dose Seasonal PF 06/08/2016  . Influenza-Unspecified 05/28/2014, 08/28/2014, 05/27/2015, 06/18/2017  . Pneumococcal Conjugate-13 01/21/2014  . Pneumococcal Polysaccharide-23 04/16/2015  . Td 09/29/2003  . Zoster 01/14/2011   Pertinent  Health Maintenance Due  Topic Date Due  . INFLUENZA VACCINE  03/28/2018  . DEXA SCAN  Completed  . PNA vac Low Risk Adult  Completed   Fall Risk  05/10/2017 05/01/2017 07/17/2016 06/09/2015 05/20/2015  Falls in the past year? No No Yes No No  Comment - - tripped  over the last stair in the house - -  Number falls in past yr: - - 1 - -   Functional Status Survey:    Vitals:   01/18/18 1023  BP: (!) 144/90  Pulse: 64  Resp: 19  Temp: 97.6 F (36.4 C)  Weight: 174 lb 9.6 oz (79.2 kg)  Height:  (1.626 m)   Body mass index is 29.97 kg/m. Physical Exam  Constitutional: She appears well-developed and well-nourished.  HENT:  Head: Normocephalic and atraumatic.  Mouth/Throat: Oropharynx is clear and moist.  Clearing nasal drainage, facial pressure noted.   Eyes: Pupils are equal, round, and reactive to light. EOM are normal.  Neck: Normal range of motion. Neck supple. No JVD present. No thyromegaly present.  Cardiovascular: Normal rate and regular rhythm.  No murmur heard. Pulmonary/Chest: Effort normal and breath sounds normal. She has no wheezes. She has no rales.  Musculoskeletal: She exhibits no edema.  Unsteady gait, ambulates with walker.   Neurological: She is alert. No cranial nerve deficit. She exhibits normal muscle tone. Coordination normal.  Oriented to person and her room on unit.   Skin: Skin is warm and dry.  Psychiatric:  She has a normal mood and affect.    Labs reviewed: Recent Labs    04/26/17 08/23/17 11/29/17  NA 139 137 140  K 3.9 3.7 4.0  BUN CREATININE 0.5 0.6 0.6   Recent Labs    04/26/17 08/23/17  AST 24 20  ALT 22 19  ALKPHOS 69 80   Recent Labs    04/26/17 08/23/17 12/18/17  WBC 7.1 9.4 7.5  HGB 14.6 14.0 14.2  HCT 43 40 42  PLT 209 220 219   Lab Results  Component Value Date   TSH 2.55 04/26/2017   Lab Results  Component Value Date   HGBA1C 5.3 04/26/2017   Lab Results  Component Value Date   CHOL 206 (A) 04/26/2017   HDL 52 04/26/2017   LDLCALC 132 04/26/2017   LDLDIRECT 173.0 08/07/2012   TRIG 108 04/26/2017   CHOLHDL 5 03/20/2016    Significant Diagnostic Results in last 30 days:  No results found.  Assessment/Plan URI (upper respiratory infection) Will  update CBC/diff, CMP, VS q shift  72hrs, Tylenol  tid x 3 days, Claritin  qd x 5 days, Mucinex  bid x 5 days, observe.   Headache URI maybe contributory, will monitor VS, neuro check q shift x 72 hr, Tylenol  tid x 3 days, observe.   Generalized weakness Generalized weakness and unsteady gait, no focal neurological symptoms, she denied dizziness upon visit, will continue to monitor the patient, update CBC/diff, CMP, VS, neuro checks.   Alzheimer's disease Continue [facility] for care needs, close supervision for safe, fall risk related lack of safety awareness and increased frailty. Saw Neurology 05/01/17, MMSE 14/30 05/08/17.      Family/ staff Communication: plan of care reviewed with the patient and charge nurse.   Labs/tests ordered:  CBC/diff, CMP  Time spend 25 minutes.

## 2018-01-18 NOTE — Assessment & Plan Note (Signed)
URI maybe contributory, will monitor VS, neuro check q shift x 72 hr, Tylenol  tid x 3 days, observe.

## 2018-01-18 NOTE — Assessment & Plan Note (Signed)
Generalized weakness and unsteady gait, no focal neurological symptoms, she denied dizziness upon visit, will continue to monitor the patient, update CBC/diff, CMP, VS, neuro checks.

## 2018-01-18 NOTE — Assessment & Plan Note (Signed)
Will update CBC/diff, CMP, VS q shift  72hrs, Tylenol  tid x 3 days, Claritin  qd x 5 days, Mucinex  bid x 5 days, observe.

## 2018-01-18 NOTE — Assessment & Plan Note (Signed)
Continue [facility] for care needs, close supervision for safe, fall risk related lack of safety awareness and increased frailty. Saw Neurology 05/01/17, MMSE 14/30 05/08/17.

## 2018-01-22 ENCOUNTER — Other Ambulatory Visit: Payer: Self-pay | Admitting: *Deleted

## 2018-01-22 LAB — COMPREHENSIVE METABOLIC PANEL
Albumin: 4.4
CALCIUM: 10
CHLORIDE: 102
CO2: 26
GFR CALC NON AF AMER: 76
Globulin: 2.3
Protein: 6.7

## 2018-01-24 ENCOUNTER — Emergency Department (HOSPITAL_COMMUNITY): Payer: Medicare Other

## 2018-01-24 ENCOUNTER — Emergency Department (HOSPITAL_COMMUNITY)
Admission: EM | Admit: 2018-01-24 | Discharge: 2018-01-24 | Disposition: A | Discharge status: Home / Self Care | Payer: Medicare Other | Attending: Emergency Medicine | Admitting: Emergency Medicine

## 2018-01-24 ENCOUNTER — Encounter: Payer: Self-pay | Admitting: Nurse Practitioner

## 2018-01-24 ENCOUNTER — Non-Acute Institutional Stay: Payer: Medicare Other | Admitting: Nurse Practitioner

## 2018-01-24 DIAGNOSIS — J323 Chronic sphenoidal sinusitis: Secondary | ICD-10-CM | POA: Insufficient documentation

## 2018-01-24 DIAGNOSIS — G301 Alzheimer's disease with late onset: Secondary | ICD-10-CM | POA: Diagnosis not present

## 2018-01-24 DIAGNOSIS — R51 Headache: Secondary | ICD-10-CM | POA: Diagnosis not present

## 2018-01-24 DIAGNOSIS — I10 Essential (primary) hypertension: Secondary | ICD-10-CM | POA: Diagnosis not present

## 2018-01-24 DIAGNOSIS — N390 Urinary tract infection, site not specified: Secondary | ICD-10-CM

## 2018-01-24 DIAGNOSIS — R519 Headache, unspecified: Secondary | ICD-10-CM

## 2018-01-24 DIAGNOSIS — F0280 Dementia in other diseases classified elsewhere without behavioral disturbance: Secondary | ICD-10-CM

## 2018-01-24 LAB — CBC WITH DIFFERENTIAL/PLATELET
Basophils Absolute: 0 10*3/uL (ref 0.0–0.1)
Basophils Relative: 0 %
Eosinophils Absolute: 0 10*3/uL (ref 0.0–0.7)
Eosinophils Relative: 0 %
HCT: 48.3 % — ABNORMAL HIGH (ref 36.0–46.0)
Hemoglobin: 16.4 g/dL — ABNORMAL HIGH (ref 12.0–15.0)
Lymphocytes Relative: 17 %
Lymphs Abs: 1.7 10*3/uL (ref 0.7–4.0)
MCH: 29.5 pg (ref 26.0–34.0)
MCHC: 34 g/dL (ref 30.0–36.0)
MCV: 87 fL (ref 78.0–100.0)
Monocytes Absolute: 0.4 10*3/uL (ref 0.1–1.0)
Monocytes Relative: 4 %
Neutro Abs: 8.4 10*3/uL — ABNORMAL HIGH (ref 1.7–7.7)
Neutrophils Relative %: 79 %
Platelets: 142 10*3/uL — ABNORMAL LOW (ref 150–400)
RBC: 5.55 MIL/uL — ABNORMAL HIGH (ref 3.87–5.11)
RDW: 13.4 % (ref 11.5–15.5)
WBC: 10.6 10*3/uL — ABNORMAL HIGH (ref 4.0–10.5)

## 2018-01-24 LAB — URINALYSIS, ROUTINE W REFLEX MICROSCOPIC
Bilirubin Urine: NEGATIVE
Glucose, UA: NEGATIVE mg/dL
Hgb urine dipstick: NEGATIVE
Ketones, ur: NEGATIVE mg/dL
Nitrite: POSITIVE — AB
Protein, ur: NEGATIVE mg/dL
Specific Gravity, Urine: 1.012 (ref 1.005–1.030)
pH: 8 (ref 5.0–8.0)

## 2018-01-24 LAB — COMPREHENSIVE METABOLIC PANEL
ALT: 32 U/L (ref 14–54)
AST: 42 U/L — ABNORMAL HIGH (ref 15–41)
Albumin: 4.5 g/dL (ref 3.5–5.0)
Alkaline Phosphatase: 73 U/L (ref 38–126)
Anion gap: 8 (ref 5–15)
BUN: 20 mg/dL (ref 6–20)
CO2: 30 mmol/L (ref 22–32)
Calcium: 9.8 mg/dL (ref 8.9–10.3)
Chloride: 102 mmol/L (ref 101–111)
Creatinine, Ser: 0.67 mg/dL (ref 0.44–1.00)
GFR calc Af Amer: >60 mL/min (ref >60)
GFR calc non Af Amer: >60 mL/min (ref >60)
Glucose, Bld: 146 mg/dL — ABNORMAL HIGH (ref 65–99)
Potassium: 4.9 mmol/L (ref 3.5–5.1)
Sodium: 140 mmol/L (ref 135–145)
Total Bilirubin: 1.3 mg/dL — ABNORMAL HIGH (ref 0.3–1.2)
Total Protein: 7.5 g/dL (ref 6.5–8.1)

## 2018-01-24 LAB — CBG MONITORING, ED: Glucose-Capillary: 152 mg/dL — ABNORMAL HIGH (ref 65–99)

## 2018-01-24 MED ORDER — SULFAMETHOXAZOLE-TRIMETHOPRIM 800-160 MG PO TABS: 1.0000 | ORAL_TABLET | Freq: Two times a day (BID) | ORAL | 0 refills | Status: AC

## 2018-01-24 MED ORDER — SULFAMETHOXAZOLE-TRIMETHOPRIM 800-160 MG PO TABS
1.0000 | ORAL_TABLET | Freq: Once | ORAL | Status: AC
  Administered 2018-01-24: 1 via ORAL
  Filled 2018-01-24: qty 1

## 2018-01-24 MED ORDER — ACETAMINOPHEN 500 MG PO TABS
1000.0000 mg | ORAL_TABLET | Freq: Once | ORAL | Status: AC
  Administered 2018-01-24: 1000 mg via ORAL
  Filled 2018-01-24: qty 2

## 2018-01-24 MED ORDER — METOCLOPRAMIDE HCL 10 MG PO TABS: 10.0000 mg | ORAL_TABLET | Freq: Four times a day (QID) | ORAL | 0 refills | Status: DC

## 2018-01-24 MED ORDER — ACETAMINOPHEN 500 MG PO TABS: 1000.0000 mg | ORAL_TABLET | Freq: Three times a day (TID) | ORAL | 0 refills | Status: DC | PRN

## 2018-01-24 MED ORDER — SODIUM CHLORIDE 0.9 % IV BOLUS
500.0000 mL | Freq: Once | INTRAVENOUS | Status: AC
  Administered 2018-01-24: 500 mL via INTRAVENOUS

## 2018-01-24 MED ORDER — METOCLOPRAMIDE HCL 5 MG/ML IJ SOLN
10.0000 mg | Freq: Once | INTRAMUSCULAR | Status: AC
  Administered 2018-01-24: 10 mg via INTRAVENOUS
  Filled 2018-01-24: qty 2

## 2018-01-24 NOTE — ED Provider Notes (Signed)
[facility]-EMERGENCY DEPT Provider Note   CSN: [id number] Arrival date & time: 01/24/18  1122     History   Chief Complaint Chief Complaint  Patient presents with  . Weakness    HPI Tricia Mendez is a 84 y.o. female with history of Alzheimer's dementia, hypertension, polymyalgia rheumatica who presents with a one-week history of right-sided headache.  Her pain has been intermittent.  It has been somewhat controlled with Tylenol at her facility.  She has had associated photophobia.  Her daughters reports she has also had some weakness.  Per EMS, patient had one episode of vomiting.  This had not been happening over the course of the past week.  No known injury or fall.  Daughters report that patient never gets headaches.  Nurse practitioner at the patient's facility tried antihistamine and thought that may be sinusitis was cause of patient's headache, however this is not been helping either.  HPI  Past Medical History:  Diagnosis Date  . Acute bronchitis 06/27/2015  . Alzheimer's dementia   . Cataract   . Cellulitis of left leg   . Depression   . Eczema   . High cholesterol   . History of renal stone    excision  . Hypertension   . Mild cognitive impairment   . Osteoarthritis   . Polymyalgia rheumatica (HCC)    rx with prednisone  . Urinary incontinence   . Varicose veins   . Xerosis of skin     Patient Active Problem List   Diagnosis Date Noted  . URI (upper respiratory infection) 01/18/2018  . Severe frontal headaches 01/18/2018  . Generalized weakness 01/18/2018  . Hypokalemia 08/24/2017  . Age-related osteoporosis without current pathological fracture 08/24/2017  . Major depression, chronic 08/24/2017  . Vitamin D deficiency 05/18/2017  . Postmenopausal estrogen deficiency 05/18/2017  . Counseling regarding advanced care planning and goals of care 05/01/2017  . Dyslipidemia 04/27/2017  . History of dizziness 04/25/2017  . Pigmented skin  lesion of uncertain nature 04/25/2017  . Pruritus 04/25/2017  . Late onset Alzheimer's disease without behavioral disturbance 07/25/2016  . Adverse drug effect 06/10/2015  . Dementia 06/10/2015  . Decreased hearing 06/10/2015  . Major depressive disorder, single episode, moderate (HCC) 02/25/2015  . Alzheimer's disease 02/25/2015  . OAB (overactive bladder) 02/25/2015  . Essential hypertension, benign 02/25/2015  . Chronic venous insufficiency 02/25/2015  . Urinary incontinence 10/08/2012  . Urinary tract infection, site not specified 10/08/2012  . Incontinence 08/07/2012  . Rectal bleeding hx 08/07/2012  . Lower back pain 12/11/2011  . UTI (urinary tract infection) 10/08/2011  . Dizziness 07/10/2011  . Venous stasis dermatitis 03/13/2011  . Foot pain, right 02/22/2011  . Prolonged depressive reaction 02/22/2011  . 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 VARICOSE VEINS, LOWER EXTREMITIES 02/14/2008  . Polymyalgia rheumatica (HCC) 06/25/2007  . WEAKNESS 03/27/2007  . Osteoarthritis 03/07/2007    Past Surgical History:  Procedure Laterality Date  . cataract surgery    . EYE SURGERY    . JOINT REPLACEMENT Bilateral    Dr. Trudee Grip  . kidney stone surgey    . lipoma removal       OB History   None      Home Medications    Prior to Admission medications   Medication Sig Start Date End Date Taking? Authorizing Provider  atenolol (TENORMIN) 50 MG tablet Take 1 tablet (50 mg total) by mouth daily. 04/26/16  Yes Panosh, Burna Mortimer  K, MD  azithromycin (AZASITE) 1 % ophthalmic solution Place 1 drop into both eyes at bedtime.   Yes [provider]  calcium carbonate (OSCAL) 1500 (600 Ca) MG TABS tablet Take 600 mg of elemental calcium by mouth 2 (two) times daily with a meal.   Yes [provider]  carboxymethylcellulose (REFRESH PLUS) 0.5 % SOLN Place 1 drop into both eyes 4 (four) times daily.    Yes [provider]    cholecalciferol (VITAMIN D) 1000 units tablet Take 1,000 Units by mouth daily.   Yes [provider]  hydrocortisone cream 1 % Apply 1 application topically at bedtime. Apply to the chest   Yes [provider]  Multiple Vitamin (MULTIVITAMIN WITH MINERALS) TABS tablet Take 1 tablet by mouth daily.   Yes [provider]  Omega-3 Fatty Acids (FISH OIL) 1200 MG CAPS Take 1 capsule by mouth daily.    Yes [provider]  predniSONE (DELTASONE) 1 MG tablet Take 3 mg by mouth daily.    Yes Donnetta Hail, MD  sertraline (ZOLOFT) 100 MG tablet TAKE 1 TABLET BY MOUTH EVERY DAY 05/08/16  Yes Panosh, [name], MD  UNABLE TO FIND Med Name: 120 SAL AC 30% in Petrolatum cream. Apply to right and left cheek dark spots at bedtime.   Yes [provider]  acetaminophen (TYLENOL) 500 MG tablet Take 2 tablets (1,000 mg total) by mouth every 8 (eight) hours as needed. 01/24/18   Randell Detter, Waylan Boga, PA-C  metoCLOPramide (REGLAN) 10 MG tablet Take 1 tablet (10 mg total) by mouth every 6 (six) hours. 01/24/18   Otie Headlee, Waylan Boga, PA-C  sulfamethoxazole-trimethoprim (BACTRIM DS,SEPTRA DS) 800-160 MG tablet Take 1 tablet by mouth 2 (two) times daily for 7 days. 01/24/18 01/31/18  Emi Holes, PA-C    Family History Family History  Problem Relation Age of Onset  . Melanoma Mother        68  . Breast cancer Mother        19  . Stroke Father        34  . Arthritis Father        40  . Diabetes Father 38       borderline diabetic  . Memory loss Sister   . Osteoporosis Other     Social History Social History   Tobacco Use  . Smoking status: Former Smoker    Start date: 08/07/1944    Last attempt to quit: 10/13/1945    Years since quitting: 72.3  . Smokeless tobacco: Former Neurosurgeon    Quit date: 10/01/1945  Substance Use Topics  . Alcohol use: No    Alcohol/week: 3.6 - 4.2 oz    Types: 6 - 7 Standard drinks or equivalent per week  . Drug use: No     Allergies    Donepezil and Remeron [mirtazapine]   Review of Systems Review of Systems  Unable to perform ROS: Dementia (Limited due to dementia)  Constitutional: Negative for chills and fever.  HENT: Negative for facial swelling and sore throat.   Eyes: Positive for photophobia.  Respiratory: Negative for shortness of breath.   Cardiovascular: Negative for chest pain.  Gastrointestinal: Positive for nausea. Negative for abdominal pain and vomiting.  Genitourinary: Negative for dysuria.  Musculoskeletal: Negative for neck pain.  Skin: Negative for rash and wound.  Neurological: Positive for weakness (generalized) and headaches.  Psychiatric/Behavioral: The patient is not nervous/anxious.      Physical Exam Updated Vital  Signs BP (!) 182/105   Pulse (!) 51   Temp 98.8 F (37.1 C) (Rectal)   Resp 20   SpO2 98%   Physical Exam  Constitutional: She appears well-developed and well-nourished. No distress.  HENT:  Head: Normocephalic and atraumatic.  Mouth/Throat: Oropharynx is clear and moist. No oropharyngeal exudate.  Eyes: Pupils are equal, round, and reactive to light. Conjunctivae and EOM are normal. Right eye exhibits no discharge. Left eye exhibits no discharge. No scleral icterus.  Neck: Normal range of motion. Neck supple. No thyromegaly present.  Cardiovascular: Normal rate, regular rhythm, normal heart sounds and intact distal pulses. Exam reveals no gallop and no friction rub.  No murmur heard. Pulmonary/Chest: Effort normal and breath sounds normal. No stridor. No respiratory distress. She has no wheezes. She has no rales.  Abdominal: Soft. Bowel sounds are normal. She exhibits no distension. There is no tenderness. There is no rebound and no guarding.  Musculoskeletal: She exhibits no edema.  Lymphadenopathy:    She has no cervical adenopathy.  Neurological: She is alert. Coordination normal.  CN 3-12 intact; normal sensation throughout; 5/5 strength in all 4 extremities;  equal bilateral grip strength  Skin: Skin is warm and dry. No rash noted. She is not diaphoretic. No pallor.  Psychiatric: She has a normal mood and affect.  Nursing note and vitals reviewed.    ED Treatments / Results  Labs (all labs ordered are listed, but only abnormal results are displayed) Labs Reviewed  COMPREHENSIVE METABOLIC PANEL - Abnormal; Notable for the following components:      Result Value   Glucose, Bld 146 (*)    AST 42 (*)    Total Bilirubin 1.3 (*)    All other components within normal limits  CBC WITH DIFFERENTIAL/PLATELET - Abnormal; Notable for the following components:   WBC 10.6 (*)    RBC 5.55 (*)    Hemoglobin 16.4 (*)    HCT 48.3 (*)    Platelets 142 (*)    Neutro Abs 8.4 (*)    All other components within normal limits  URINALYSIS, ROUTINE W REFLEX MICROSCOPIC - Abnormal; Notable for the following components:   APPearance CLOUDY (*)    Nitrite POSITIVE (*)    Leukocytes, UA TRACE (*)    Bacteria, UA MANY (*)    All other components within normal limits  CBG MONITORING, ED - Abnormal; Notable for the following components:   Glucose-Capillary 152 (*)    All other components within normal limits  URINE CULTURE    EKG None  Radiology Ct Head Wo Contrast  Result Date: 01/24/2018 CLINICAL DATA:  Headache 1 week.  Dementia. EXAM: CT HEAD WITHOUT CONTRAST TECHNIQUE: Contiguous axial images were obtained from the base of the skull through the vertex without intravenous contrast. COMPARISON:  MRI head 01/07/2009 FINDINGS: Brain: Cerebral atrophy most prominent in the frontal and temporal lobes. Mild progression since 2010. Negative for hydrocephalus. Negative for acute infarct.  Negative for hemorrhage or mass. 7.7 mm cyst in the posterior sella compatible with pituitary cyst. This was present previously but has enlarged. Vascular: Negative for hyperdense vessel Skull: No skull lesion. Sinuses/Orbits: Extensive opacification of the sphenoid sinus with  mild bony thickening. Question chronic sinusitis versus prior trans-sphenoidal pituitary surgery. Review of the prior MRI demonstrates T1 hyperintensity within the sphenoid sinus which could be due to fat from prior surgery versus hyperintense secretions. Other: None IMPRESSION: Frontotemporal atrophy.  No acute abnormality. 7.7 mm cyst posterior sella likely a benign  pituitary cyst or Rathke's cleft cyst. This has progressed in size since 2010 Extensive mucosal disease in the sphenoid sinus with bony thickening likely related to chronic sinusitis. Question history of prior trans-sphenoidal pituitary surgery. Electronically Signed   By: Marlan Palau M.D.   On: 01/24/2018 13:11    Procedures Procedures (including critical care time)  Medications Ordered in ED Medications  sodium chloride 0.9 % bolus 500 mL (0 mLs Intravenous Stopped 01/24/18 1517)  metoCLOPramide (REGLAN) injection 10 mg (10 mg Intravenous Given 01/24/18 1322)  sulfamethoxazole-trimethoprim (BACTRIM DS,SEPTRA DS) 800-160 MG per tablet 1 tablet (1 tablet Oral Provided for home use 01/24/18 1430)  acetaminophen (TYLENOL) tablet 1,000 mg (1,000 mg Oral Provided for home use 01/24/18 1430)     Initial Impression / Assessment and Plan / ED Course  I have reviewed the triage vital signs and the nursing notes.  Pertinent labs & imaging results that were available during my care of the patient were reviewed by me and considered in my medical decision making (see chart for details).     Patient with suspected chronic sinusitis as cause of pain.  Normal neuro exam without focal deficits.  CT head shows extensive mucosal disease in the sphenoid sinus with bony thickening related to chronic sinusitis, frontotemporal atrophy, as well as 7.7 mm cyst posterior stoma likely benign posterior cyst or vascular cleft cyst, this is been present since 2010 and has progressed.  I made family aware to make facility provider aware.  Patient also has UTI.   CBC shows very mild white blood cell count, 10.6, hemoglobin 16.4.  CMP shows AST 42, albumin 1.3.  EKG shows NSR, rate 56, left axis deviation. UA shows positive nitrites, many bacteria, 21-50 WBCs.  Urine culture sent.  Will treat with Bactrim to cover UTI and sinusitis.  Nurse practitioner in 3 to 4 days for recheck.  Suspect blood pressure elevated due to pain.  Tylenol given for pain at home as well as Reglan for nausea.  Return precautions discussed.  Family understands and agrees with plan.  Patient discharged in satisfactory condition.  Patient also evaluated by Dr. Freida Busman who guided the patient's management and agrees with plan for  Final Clinical Impressions(s) / ED Diagnoses   Final diagnoses:  Chronic sphenoidal sinusitis  Acute nonintractable headache, unspecified headache type  Lower urinary tract infectious disease    ED Discharge Orders        Ordered    sulfamethoxazole-trimethoprim (BACTRIM DS,SEPTRA DS) 800-160 MG tablet  2 times daily     01/24/18 1434    metoCLOPramide (REGLAN) 10 MG tablet  Every 6 hours     01/24/18 1434    acetaminophen (TYLENOL) 500 MG tablet  Every 8 hours PRN     01/24/18 1434       LawWaylan Boga, PA-C 01/24/18 1542    Lorre Nick, MD 01/25/18 1730

## 2018-01-24 NOTE — Assessment & Plan Note (Signed)
Stopped Namenda 12/17/17 due to intolerance. She resides in [facility], ambulates with walker.

## 2018-01-24 NOTE — ED Triage Notes (Signed)
Transported by [facility] from facility--headache and weakness x 1 week. Hx of dementia and EMS reports that patient is AAO x 2. Also one episode of vomiting noted upon arrival.

## 2018-01-24 NOTE — Progress Notes (Signed)
Location:   [facility] Room Number: 801 Place of Service:  ALF (13) Provider: Arna Snipe Mast NP  Oneal Grout, MD  Patient Care Team: Oneal Grout, MD as PCP - General (Internal Medicine) Donnetta Hail, MD (Rheumatology) Ernesto Rutherford, MD (Ophthalmology) Nita Sells, MD (Dermatology) Crist Fat, MD as Attending Physician (Urology) Mast, Man X, NP as Nurse Practitioner (Internal Medicine)  Extended Emergency Contact Information Primary Emergency Contact: McClanahan,Susan Address: [address] [place], [place] [address] [place] Home Phone: [phone] Mobile Phone: [phone] Relation: Daughter Secondary Emergency Contact: Gardiner Sleeper [place] of [place] Home Phone: [phone] Relation: Daughter  Code Status: DNR Goals of care: Advanced Directive information Advanced Directives 01/18/2018  Does Patient Have a Medical Advance Directive? Yes  Type of Advance Directive Out of facility DNR (pink MOST or yellow form)  Does patient want to make changes to medical advance directive? No - Patient declined  Copy of Healthcare Power of Attorney in Chart? -  Would patient like information on creating a medical advance directive? -  Pre-existing out of facility DNR order (yellow form or pink MOST form) Pink MOST form placed in chart (order not valid for inpatient use)     Chief Complaint  Patient presents with  . Acute Visit    Elevated BP, HA,     HPI:  Pt is a 84 y.o. female seen today for an acute visit for elevated Bp 180/90-100, c/o headache at the right forehead, dull, Tylenol  about 2 hours ago is not effective. HPI was provided with assistance of patient's daughters due to her severe dementia. She has chronic red eyes, saw Ophthalmology 01/16/18, started Refresh, Azasite, Avendva. She denied change of vision, dizziness, chest pain/pressures, palpitation,  nausea, vomiting, or constipation. No new  focal neurological symptoms upon my examination. Tylenol  administered at 10:30 am. The patient's daughters desire ED eval and treat. She has history of HTN, blood pressure is usually controlled on Atenolol  qd. She was treated for URI 01/18/18 with Claritin  qd and Mucinex  bid x 5 days. Last treated for UTI was 12/20/17 with 7 day course of Nitrofurantoin  bid.    Past Medical History:  Diagnosis Date  . Acute bronchitis 06/27/2015  . Alzheimer's dementia   . Cataract   . Cellulitis of left leg   . Depression   . Eczema   . High cholesterol   . History of renal stone    excision  . Hypertension   . Mild cognitive impairment   . Osteoarthritis   . Polymyalgia rheumatica (HCC)    rx with prednisone  . Urinary incontinence   . Varicose veins   . Xerosis of skin    Past Surgical History:  Procedure Laterality Date  . cataract surgery    . EYE SURGERY    . JOINT REPLACEMENT Bilateral    Dr. Trudee Grip  . kidney stone surgey    . lipoma removal      Allergies  Allergen Reactions  . Donepezil Other (See Comments)    BAD DREAMS  . Remeron [Mirtazapine] Other (See Comments)    Dizzy     Allergies as of 01/24/2018      Reactions   Donepezil Other (See Comments)   BAD DREAMS   Remeron [mirtazapine] Other (See Comments)   Dizzy       Medication List  Accurate as of 01/24/18 11:11 AM. Always use your most recent med list.          atenolol 50 MG tablet Commonly known as:  TENORMIN Take 1 tablet (50 mg total) by mouth daily.   azithromycin 1 % ophthalmic solution Commonly known as:  AZASITE Place 1 drop into both eyes at bedtime.   calcium carbonate 1500 (600 Ca) MG Tabs tablet Commonly known as:  OSCAL Take 600 mg of elemental calcium by mouth 2 (two) times daily with a meal.   carboxymethylcellulose 0.5 % Soln Commonly known as:  REFRESH PLUS Place 1 drop into both eyes 4 (four) times daily.   cholecalciferol 1000 units  tablet Commonly known as:  VITAMIN D Take 1,000 Units by mouth daily.   Fish Oil 1200 MG Caps Take 1 capsule by mouth daily.   hydrocortisone cream 1 % Apply 1 application topically at bedtime. Apply to the chest   predniSONE 1 MG tablet Commonly known as:  DELTASONE Take 3 mg by mouth daily.   sertraline 100 MG tablet Commonly known as:  ZOLOFT TAKE 1 TABLET BY MOUTH EVERY DAY   UNABLE TO FIND Med Name: 120 SAL AC 30% in Petrolatum cream. Apply to right and left cheek dark spots at bedtime.      ROS was provided with assistance of staff and family.  Review of Systems  Constitutional: Positive for activity change. Negative for appetite change, chills, diaphoresis, fatigue and fever.  HENT: Positive for hearing loss. Negative for congestion, rhinorrhea, sinus pressure, sinus pain, trouble swallowing and voice change.   Eyes: Positive for redness.  Respiratory: Negative for cough, shortness of breath and wheezing.   Cardiovascular: Negative for chest pain, palpitations and leg swelling.  Gastrointestinal: Negative for abdominal distention, abdominal pain, constipation, diarrhea, nausea and vomiting.  Genitourinary: Negative for difficulty urinating, dysuria and urgency.  Musculoskeletal: Positive for arthralgias and gait problem.  Neurological: Positive for headaches. Negative for dizziness, syncope, facial asymmetry, speech difficulty, weakness, light-headedness and numbness.       Dementia  Psychiatric/Behavioral: Negative for agitation, behavioral problems, hallucinations and sleep disturbance. The patient is not nervous/anxious.     Immunization History  Administered Date(s) Administered  . Influenza Whole 05/28/2012  . Influenza, High Dose Seasonal PF 06/08/2016  . Influenza-Unspecified 05/28/2014, 08/28/2014, 05/27/2015, 06/18/2017  . Pneumococcal Conjugate-13 01/21/2014  . Pneumococcal Polysaccharide-23 04/16/2015  . Td 09/29/2003  . Zoster 01/14/2011   Pertinent   Health Maintenance Due  Topic Date Due  . INFLUENZA VACCINE  03/28/2018  . DEXA SCAN  Completed  . PNA vac Low Risk Adult  Completed   Fall Risk  05/10/2017 05/01/2017 07/17/2016 06/09/2015 05/20/2015  Falls in the past year? No No Yes No No  Comment - - tripped over the last stair in the house - -  Number falls in past yr: - - 1 - -   Functional Status Survey:    Vitals:   01/24/18 1002  BP: (!) 180/90  Pulse: 60  Resp: (!) 22  Temp: 97.6 F (36.4 C)   There is no height or weight on file to calculate BMI. Physical Exam  Constitutional: She appears well-developed and well-nourished.  HENT:  Head: Normocephalic and atraumatic.  Eyes: Pupils are equal, round, and reactive to light. EOM are normal. Right eye exhibits no discharge. Left eye exhibits no discharge. No scleral icterus.  Redness in R+L eyes.   Neck: Normal range of motion. Neck supple. No JVD present. No thyromegaly present.  Cardiovascular: Normal rate and regular rhythm.  No murmur heard. Pulmonary/Chest: Effort normal and breath sounds normal. She has no wheezes. She has no rales.  Abdominal: Soft. Bowel sounds are normal.  Musculoskeletal: She exhibits no edema.  Neurological: She is alert. No sensory deficit. She exhibits normal muscle tone. Coordination normal.  Oriented to person and place.   Skin: Skin is warm and dry.  Brownish pigmented BLE  Psychiatric: She has a normal mood and affect. Her behavior is normal.    Labs reviewed: Recent Labs    08/23/17 11/29/17 01/18/18  NA 137 140 139  K 3.7 4.0 3.9  CL  --   --  102  CO2  --   --  26  BUN CREATININE 0.6 0.6 0.7  CALCIUM  --   --  10.0   Recent Labs    04/26/17 08/23/17 01/18/18  AST ALT ALKPHOS 69 80 75  ALBUMIN  --   --  4.4   Recent Labs    08/23/17 12/18/17 01/18/18  WBC 9.4 7.5 9.4  HGB 14.0 14.2 15.8  HCT 40 42 47*  PLT 220 219 236   Lab Results  Component Value Date   TSH 2.55 04/26/2017   Lab  Results  Component Value Date   HGBA1C 5.3 04/26/2017   Lab Results  Component Value Date   CHOL 206 (A) 04/26/2017   HDL 52 04/26/2017   LDLCALC 132 04/26/2017   LDLDIRECT 173.0 08/07/2012   TRIG 108 04/26/2017   CHOLHDL 5 03/20/2016    Significant Diagnostic Results in last 30 days:  No results found.  Assessment/Plan: Essential hypertension, benign Elevated Bp 180/90-155mmHg, had Atenolol  this am, plan to administer Hydralazine or Clonidine and monitor vitals/neuro checks but the patient's daughters desire ED evaluation or treatment.   Severe frontal headaches Not new per the patient's daughters, Tylenol  2 hours ago was not effective, too soon to evaluate Tylenol  administered around 10:30am. Not apparent new focal weakness. Elevated Bp may be contributory or vice versa.   Late onset Alzheimer's disease without behavioral disturbance Stopped Namenda 12/17/17 due to intolerance. She resides in [facility], ambulates with walker.     Family/ staff Communication: plan of care reviewed with the patient, patient's daughters, and charge nurse.   Labs/tests ordered:  None  Time spend 25 minutes.

## 2018-01-24 NOTE — Assessment & Plan Note (Signed)
Elevated Bp 180/90-17mmHg, had Atenolol  this am, plan to administer Hydralazine or Clonidine and monitor vitals/neuro checks but the patient's daughters desire ED evaluation or treatment.

## 2018-01-24 NOTE — Assessment & Plan Note (Signed)
Not new per the patient's daughters, Tylenol  2 hours ago was not effective, too soon to evaluate Tylenol  administered around 10:30am. Not apparent new focal weakness. Elevated Bp may be contributory or vice versa.

## 2018-01-24 NOTE — ED Notes (Signed)
Patient transported to CT 

## 2018-01-24 NOTE — Discharge Instructions (Signed)
Take Bactrim until completed.  Take Reglan every 6 hours as needed for nausea or vomiting.  Take Tylenol 3 times daily, as needed for headache.  Continue Claritin and Mucinex as prescribed to help with sinusitis.  Please follow-up with nurse practitioner in 3 to 4 days for recheck of blood pressure.  Please return to the emergency department if you develop any new or worsening symptoms.  Your CT scan showed: Frontotemporal atrophy.  No acute abnormality.   7.7 mm cyst posterior sella likely a benign pituitary cyst or Rathke's cleft cyst. This has progressed in size since 2010   Extensive mucosal disease in the sphenoid sinus with bony thickening likely related to chronic sinusitis. Question history of prior trans-sphenoidal pituitary surgery.

## 2018-01-24 NOTE — ED Provider Notes (Signed)
Medical screening examination/treatment/procedure(s) were conducted as a shared visit with non-physician practitioner(s) and myself.  I personally evaluated the patient during the encounter.  None 84 year old female here with headache and weakness x1 week.  CT is consistent with sinusitis.  Also has UTI.  Will treat with Bactrim and return precautions given.   Lorre Nick, MD 01/24/18 1447

## 2018-01-24 NOTE — ED Notes (Signed)
Bed: UJ81 Expected date:  Expected time:  Means of arrival:  Comments: 84 y.o

## 2018-01-25 ENCOUNTER — Encounter: Payer: Self-pay | Admitting: Nurse Practitioner

## 2018-01-25 DIAGNOSIS — J329 Chronic sinusitis, unspecified: Secondary | ICD-10-CM | POA: Insufficient documentation

## 2018-01-25 LAB — URINE CULTURE

## 2018-02-21 ENCOUNTER — Non-Acute Institutional Stay: Payer: Medicare Other | Admitting: Internal Medicine

## 2018-02-21 ENCOUNTER — Encounter: Payer: Self-pay | Admitting: Internal Medicine

## 2018-02-21 DIAGNOSIS — E785 Hyperlipidemia, unspecified: Secondary | ICD-10-CM | POA: Diagnosis not present

## 2018-02-21 DIAGNOSIS — G301 Alzheimer's disease with late onset: Secondary | ICD-10-CM

## 2018-02-21 DIAGNOSIS — F0280 Dementia in other diseases classified elsewhere without behavioral disturbance: Secondary | ICD-10-CM | POA: Diagnosis not present

## 2018-02-21 DIAGNOSIS — I10 Essential (primary) hypertension: Secondary | ICD-10-CM

## 2018-02-21 DIAGNOSIS — M353 Polymyalgia rheumatica: Secondary | ICD-10-CM | POA: Diagnosis not present

## 2018-02-21 DIAGNOSIS — F329 Major depressive disorder, single episode, unspecified: Secondary | ICD-10-CM | POA: Diagnosis not present

## 2018-02-21 NOTE — Progress Notes (Signed)
Location:  [facility] Room Number: [phone] Place of Service:  [facility] [phone]) Provider:  Oneal Grout MD  Oneal Grout, MD  Patient Care Team: Oneal Grout, MD as PCP - General (Internal Medicine) Donnetta Hail, MD (Rheumatology) Ernesto Rutherford, MD (Ophthalmology) Nita Sells, MD (Dermatology) Crist Fat, MD as Attending Physician (Urology) Mast, Man X, NP as Nurse Practitioner (Internal Medicine)  Extended Emergency Contact Information Primary Emergency Contact: McClanahan,Susan Address: [address] [place], [place] [address] [place] of [place] Home Phone: [phone] Mobile Phone: [phone] Relation: Daughter Secondary Emergency Contact: Gardiner Sleeper [place] of [place] Home Phone: [phone] Relation: Daughter  Code Status:  DNR  Goals of care: Advanced Directive information Advanced Directives 02/21/2018  Does Patient Have a Medical Advance Directive? Yes  Type of Advance Directive Out of facility DNR (pink MOST or yellow form)  Does patient want to make changes to medical advance directive? No - Patient declined  Copy of Healthcare Power of Attorney in Chart? -  Would patient like information on creating a medical advance directive? -  Pre-existing out of facility DNR order (yellow form or pink MOST form) Pink MOST form placed in chart (order not valid for inpatient use)     Chief Complaint  Patient presents with  . Medical Management of Chronic Issues    Routine Visit     HPI:  Pt is a 84 y.o. female seen today for medical management of chronic diseases. She is hard of hearing and misplaced her hearing aid yesterday. She has been at her baseline per nursing. She denies any acute health concern this visit. No new concern form nursing. She is seen in her room today with charge nurse present. She was in the ED on 01/24/18 with UTI and acute sinusitis and was placed on bactrim. Has completed her  antibiotic and urine culture report reviewed, showing multiple species with recollection suggested.   PMR- currently on prednisone 3 mg daily with tylenol 1000 mg q8h prn pain.   Chronic depression- mood appears stable. Currently on sertraline 100 mg daily. Tolerating it well.   Essential hypertension- takes atenolol 50 mg daily. Overall stable BP on review. 2 readings with SBP in 140 and 150 range.   Hyperlipidemia- taking fish oil at present.   Alzheimer's dementia- walks around with her walker, no fall reported, able to express her needs, continent with her bowel and bladder, goes to dining area for meals. No acute behavior change reported.    Past Medical History:  Diagnosis Date  . Acute bronchitis 06/27/2015  . Alzheimer's dementia   . Cataract   . Cellulitis of left leg   . Depression   . Eczema   . High cholesterol   . History of renal stone    excision  . Hypertension   . Mild cognitive impairment   . Osteoarthritis   . Polymyalgia rheumatica (HCC)    rx with prednisone  . Urinary incontinence   . Varicose veins   . Xerosis of skin    Past Surgical History:  Procedure Laterality Date  . cataract surgery    . EYE SURGERY    . JOINT REPLACEMENT Bilateral    Dr. Trudee Grip  . kidney stone surgey    . lipoma removal      Allergies  Allergen Reactions  . Donepezil Other (See Comments)    BAD DREAMS  . Remeron [Mirtazapine] Other (See Comments)  Dizzy     Outpatient Encounter Medications as of 02/21/2018  Medication Sig  . acetaminophen (TYLENOL) 500 MG tablet Take 2 tablets (1,000 mg total) by mouth every 8 (eight) hours as needed.  Marland Kitchen atenolol (TENORMIN) 50 MG tablet Take 1 tablet (50 mg total) by mouth daily.  Marland Kitchen azithromycin (AZASITE) 1 % ophthalmic solution Place 1 drop into both eyes at bedtime.  . calcium carbonate (OSCAL) 1500 (600 Ca) MG TABS tablet Take 600 mg of elemental calcium by mouth 2 (two) times daily with a meal.  .  carboxymethylcellulose (REFRESH PLUS) 0.5 % SOLN Place 1 drop into both eyes 4 (four) times daily.   . cholecalciferol (VITAMIN D) 1000 units tablet Take 1,000 Units by mouth daily.  . Eyelid Cleansers (AVENOVA EX) Apply 1 application topically 2 (two) times daily.  . hydrocortisone cream 1 % Apply 1 application topically as needed. Apply to the chest   . Multiple Vitamin (MULTIVITAMIN WITH MINERALS) TABS tablet Take 1 tablet by mouth daily.  . Omega-3 Fatty Acids (FISH OIL) 1200 MG CAPS Take 1 capsule by mouth daily.   . predniSONE (DELTASONE) 1 MG tablet Take 3 mg by mouth daily.   . sertraline (ZOLOFT) 100 MG tablet TAKE 1 TABLET BY MOUTH EVERY DAY  . UNABLE TO FIND Med Name: 120 SAL AC 30% in Petrolatum cream. Apply to right and left cheek dark spots at bedtime.  . [DISCONTINUED] metoCLOPramide (REGLAN) 10 MG tablet Take 1 tablet (10 mg total) by mouth every 6 (six) hours.   No facility-administered encounter medications on file as of 02/21/2018.     Review of Systems  Constitutional: Negative for appetite change, chills, fatigue and fever.  HENT: Positive for rhinorrhea. Negative for congestion, ear discharge, ear pain, mouth sores, sinus pressure and trouble swallowing.   Eyes: Positive for visual disturbance.       Has corrective glasses  Respiratory: Negative for cough and shortness of breath.   Cardiovascular: Negative for chest pain, palpitations and leg swelling.  Gastrointestinal: Negative for abdominal pain, constipation, diarrhea, nausea and vomiting.  Genitourinary: Negative for dysuria and hematuria.  Musculoskeletal: Positive for arthralgias and gait problem. Negative for back pain.       Uses walker for ambulation, no fall reported  Skin: Negative for rash and wound.  Neurological: Negative for dizziness, light-headedness, numbness and headaches.  Psychiatric/Behavioral: Positive for confusion. Negative for behavioral problems and dysphoric mood. The patient is not  nervous/anxious.     Immunization History  Administered Date(s) Administered  . Influenza Whole 05/28/2012  . Influenza, High Dose Seasonal PF 06/08/2016  . Influenza-Unspecified 05/28/2014, 08/28/2014, 05/27/2015, 06/18/2017  . Pneumococcal Conjugate-13 01/21/2014  . Pneumococcal Polysaccharide-23 04/16/2015  . Td 09/29/2003  . Zoster 01/14/2011   Pertinent  Health Maintenance Due  Topic Date Due  . INFLUENZA VACCINE  03/28/2018  . DEXA SCAN  Completed  . PNA vac Low Risk Adult  Completed   Fall Risk  05/10/2017 05/01/2017 07/17/2016 06/09/2015 05/20/2015  Falls in the past year? No No Yes No No  Comment - - tripped over the last stair in the house - -  Number falls in past yr: - - 1 - -   Functional Status Survey:    Vitals:   02/21/18 1408  BP: 130/70  Pulse: 65  Resp: 18  Temp: 98.7 F (37.1 C)  TempSrc: Oral  SpO2: 94%  Weight: 177 lb 9.6 oz (80.6 kg)  Height: 5\' 4"  (1.626 m)   Body mass  index is 30.48 kg/m.   Wt Readings from Last 3 Encounters:  02/21/18 177 lb 9.6 oz (80.6 kg)  01/24/18 174 lb (78.9 kg)  01/18/18 174 lb 9.6 oz (79.2 kg)   Physical Exam  Constitutional: No distress.  Obese elderly female in NAD  HENT:  Head: Normocephalic and atraumatic.  Right Ear: External ear normal.  Left Ear: External ear normal.  Nose: Nose normal.  Mouth/Throat: Oropharynx is clear and moist. No oropharyngeal exudate.  Eyes: Pupils are equal, round, and reactive to light. Conjunctivae and EOM are normal. Right eye exhibits no discharge. Left eye exhibits no discharge.  Has corrective glasses  Neck: Normal range of motion. Neck supple.  Cardiovascular: Normal rate, regular rhythm and intact distal pulses.  Pulmonary/Chest: Effort normal and breath sounds normal. No respiratory distress. She has no wheezes.  Abdominal: Soft. Bowel sounds are normal. There is no tenderness. There is no guarding.  Musculoskeletal: She exhibits no edema.  Able to move all 4  extremities, unsteady gait  Lymphadenopathy:    She has no cervical adenopathy.  Neurological: She is alert.  Oriented to self and place  Skin: Skin is warm and dry. She is not diaphoretic.  Psychiatric: She has a normal mood and affect.    Labs reviewed: Recent Labs    11/29/17 01/18/18 01/24/18 1227  NA 140 139 140  K 4.0 3.9 4.9  CL  --  102 102  CO2  --  26 30  GLUCOSE  --   --  146*  BUN 21 18 20   CREATININE 0.6 0.7 0.67  CALCIUM  --  10.0 9.8   Recent Labs    08/23/17 01/18/18 01/24/18 1227  AST 20 22 42*  ALT 19 19 32  ALKPHOS 80 75 73  BILITOT  --   --  1.3*  PROT  --   --  7.5  ALBUMIN  --  4.4 4.5   Recent Labs    12/18/17 01/18/18 01/24/18 1227  WBC 7.5 9.4 10.6*  NEUTROABS  --   --  8.4*  HGB 14.2 15.8 16.4*  HCT 42 47* 48.3*  MCV  --   --  87.0  PLT 219 236 142*   Lab Results  Component Value Date   TSH 2.55 04/26/2017   Lab Results  Component Value Date   HGBA1C 5.3 04/26/2017   Lab Results  Component Value Date   CHOL 206 (A) 04/26/2017   HDL 52 04/26/2017   LDLCALC 132 04/26/2017   LDLDIRECT 173.0 08/07/2012   TRIG 108 04/26/2017   CHOLHDL 5 03/20/2016    Significant Diagnostic Results in last 30 days:  Ct Head Wo Contrast  Result Date: 01/24/2018 CLINICAL DATA:  Headache 1 week.  Dementia. EXAM: CT HEAD WITHOUT CONTRAST TECHNIQUE: Contiguous axial images were obtained from the base of the skull through the vertex without intravenous contrast. COMPARISON:  MRI head 01/07/2009 FINDINGS: Brain: Cerebral atrophy most prominent in the frontal and temporal lobes. Mild progression since 2010. Negative for hydrocephalus. Negative for acute infarct.  Negative for hemorrhage or mass. 7.7 mm cyst in the posterior sella compatible with pituitary cyst. This was present previously but has enlarged. Vascular: Negative for hyperdense vessel Skull: No skull lesion. Sinuses/Orbits: Extensive opacification of the sphenoid sinus with mild bony thickening.  Question chronic sinusitis versus prior trans-sphenoidal pituitary surgery. Review of the prior MRI demonstrates T1 hyperintensity within the sphenoid sinus which could be due to fat from prior surgery versus hyperintense secretions. Other:  None IMPRESSION: Frontotemporal atrophy.  No acute abnormality. 7.7 mm cyst posterior sella likely a benign pituitary cyst or Rathke's cleft cyst. This has progressed in size since 2010 Extensive mucosal disease in the sphenoid sinus with bony thickening likely related to chronic sinusitis. Question history of prior trans-sphenoidal pituitary surgery. Electronically Signed   By: Marlan Palau M.D.   On: 01/24/2018 13:11    Assessment/Plan  1. Polymyalgia rheumatica (HCC) Continue chronic prednisone dosing and tylenol as needed. Denies pain this visit.   2. Essential hypertension, benign Continue atenolol and monitor BP, BMP reviewed.   3. Late onset Alzheimer's disease without behavioral disturbance Continue supportive care  4. Major depression, chronic Continue sertraline and monitor  5. Dyslipidemia Continue fish oil supplement. With her age and dementia, I will not start statin for her. Monitor lipid once a year.     Family/ staff Communication: reviewed care plan with patient and charge nurse.    Labs/tests ordered:  Lipid, cbc, CMP, TSH 04/09/18   Oneal Grout, MD Internal Medicine [facility] [address] [place], [place] [address] Cell Phone (Monday-Friday 8 am - 5 pm): [phone] On Call: [phone] and follow prompts after 5 pm and on weekends Office Phone: [phone] Office Fax: [phone]

## 2018-02-22 ENCOUNTER — Encounter: Payer: Self-pay | Admitting: Nurse Practitioner

## 2018-02-22 DIAGNOSIS — H109 Unspecified conjunctivitis: Secondary | ICD-10-CM | POA: Insufficient documentation

## 2018-04-09 LAB — HEPATIC FUNCTION PANEL
ALK PHOS: 67 (ref 25–125)
ALT: 16 (ref 7–35)
AST: 19 (ref 13–35)
Bilirubin, Total: 0.5

## 2018-04-09 LAB — BASIC METABOLIC PANEL
BUN: 18 (ref 4–21)
Creatinine: 0.6 (ref <=1.1)
GLUCOSE: 96
Potassium: 3.9 (ref 3.4–5.3)
Sodium: 139 (ref 137–147)

## 2018-04-09 LAB — LIPID PANEL
Cholesterol: 217 — AB (ref 0–200)
HDL: 51 (ref 35–70)
LDL Cholesterol: 147
LDl/HDL Ratio: 4.3
TRIGLYCERIDES: 86 (ref 40–160)

## 2018-04-09 LAB — CBC AND DIFFERENTIAL
HEMATOCRIT: 41 (ref 36–46)
Hemoglobin: 13.7 (ref 12.0–16.0)
PLATELETS: 218 (ref 150–399)
WBC: 7.7

## 2018-04-09 LAB — TSH: TSH: 2.77 (ref <=5.90)

## 2018-04-10 ENCOUNTER — Encounter: Payer: Self-pay | Admitting: Internal Medicine

## 2018-04-11 ENCOUNTER — Other Ambulatory Visit: Payer: Self-pay | Admitting: *Deleted

## 2018-04-11 LAB — COMPLETE METABOLIC PANEL WITH GFR
ALBUMIN: 3.7
CALCIUM: 9.2
CHLORIDE: 106
Carbon Dioxide, Total: 28
Globulin: 1.9
Total Protein: 5.6 g/dL

## 2018-05-02 ENCOUNTER — Non-Acute Institutional Stay: Payer: Medicare Other | Admitting: Nurse Practitioner

## 2018-05-02 ENCOUNTER — Encounter: Payer: Self-pay | Admitting: Nurse Practitioner

## 2018-05-02 DIAGNOSIS — G301 Alzheimer's disease with late onset: Secondary | ICD-10-CM | POA: Diagnosis not present

## 2018-05-02 DIAGNOSIS — F0280 Dementia in other diseases classified elsewhere without behavioral disturbance: Secondary | ICD-10-CM | POA: Diagnosis not present

## 2018-05-02 DIAGNOSIS — F418 Other specified anxiety disorders: Secondary | ICD-10-CM

## 2018-05-02 NOTE — Assessment & Plan Note (Addendum)
Memory recall difficulty noted, under evaluation by Dr. Anne Hahn, MMSE 20/30 05/04/17. Will update MMSE. Hx of intolerance of Namenda and Exelon.

## 2018-05-02 NOTE — Progress Notes (Signed)
Location:   [facility] Room Number: 801 Place of Service: [facility] Provider:  Arna Snipe Avamae Dehaan NP  Oneal Grout, MD  Patient Care Team: Oneal Grout, MD as PCP - General (Internal Medicine) Donnetta Hail, MD (Rheumatology) Ernesto Rutherford, MD (Ophthalmology) Nita Sells, MD (Dermatology) Crist Fat, MD as Attending Physician (Urology) Dwayn Moravek X, NP as Nurse Practitioner (Internal Medicine)  Extended Emergency Contact Information Primary Emergency Contact: McClanahan,Susan Address: [address] [place], [place] [address] [place] of [place] Home Phone: [phone] Mobile Phone: [phone] Relation: Daughter Secondary Emergency Contact: Gardiner Sleeper [place] Home Phone: [phone] Relation: Daughter  Code Status:  DNR Goals of care: Advanced Directive information Advanced Directives 02/21/2018  Does Patient Have a Medical Advance Directive? Yes  Type of Advance Directive Out of facility DNR (pink MOST or yellow form)  Does patient want to make changes to medical advance directive? No - Patient declined  Copy of Healthcare Power of Attorney in Chart? -  Would patient like information on creating a medical advance directive? -  Pre-existing out of facility DNR order (yellow form or pink MOST form) Pink MOST form placed in chart (order not valid for inpatient use)     Chief Complaint  Patient presents with  . Acute Visit    Anxiety    HPI:  Pt is a 84 y.o. female seen today for the patient's daughter requested to evaluate the patient's anxiety, stated the patient has been calling in the evenings, asking where you are, don't know what I am suppose to be doing. HPI was provided with assistance of staff and family due to her dementia. [facility] resides in [facility] for safety and care assistance, not on memory preserving meds. She is allergic to Donepezil. Hx of depression, on Sertraline 100mg  qd. She sleeps well night,  her appetite remains the same, her routine daily activities has no change. She denied headache, chest pain/pressures, palpitation, abd pain, nausea, vomiting, constipation, or diarrhea. She is afebrile. Unremarkable CBC CMP TSH 04/09/18.   Past Medical History:  Diagnosis Date  . Acute bronchitis 06/27/2015  . Alzheimer's dementia   . Cataract   . Cellulitis of left leg   . Depression   . Eczema   . High cholesterol   . History of renal stone    excision  . Hypertension   . Mild cognitive impairment   . Osteoarthritis   . Polymyalgia rheumatica (HCC)    rx with prednisone  . Urinary incontinence   . Varicose veins   . Xerosis of skin    Past Surgical History:  Procedure Laterality Date  . cataract surgery    . EYE SURGERY    . JOINT REPLACEMENT Bilateral    Dr. Trudee Grip  . kidney stone surgey    . lipoma removal      Allergies  Allergen Reactions  . Donepezil Other (See Comments)    BAD DREAMS  . Namenda [Memantine]   . Remeron [Mirtazapine] Other (See Comments)    Dizzy     Allergies as of 05/02/2018      Reactions   Donepezil Other (See Comments)   BAD DREAMS   Namenda [memantine]    Remeron [mirtazapine] Other (See Comments)   Dizzy       Medication List        Accurate as of 05/02/18 11:59 PM. Always use your most recent med list.  acetaminophen 500 MG tablet Commonly known as:  TYLENOL Take 2 tablets (1,000 mg total) by mouth every 8 (eight) hours as needed.   atenolol 50 MG tablet Commonly known as:  TENORMIN Take 1 tablet (50 mg total) by mouth daily.   AVENOVA EX Apply 1 application topically 2 (two) times daily.   azithromycin 1 % ophthalmic solution Commonly known as:  AZASITE Place 1 drop into both eyes at bedtime.   calcium carbonate 1500 (600 Ca) MG Tabs tablet Commonly known as:  OSCAL Take 600 mg of elemental calcium by mouth 2 (two) times daily with a meal.   carbamide peroxide 6.5 % OTIC solution Commonly known as:   DEBROX 5 drops 2 (two) times daily. For 3 nights   carboxymethylcellulose 0.5 % Soln Commonly known as:  REFRESH PLUS Place 1 drop into both eyes 4 (four) times daily.   cholecalciferol 1000 units tablet Commonly known as:  VITAMIN D Take 1,000 Units by mouth daily.   Fish Oil 1200 MG Caps Take 1 capsule by mouth daily.   hydrocortisone cream 1 % Apply 1 application topically as needed. Apply to the chest   multivitamin with minerals Tabs tablet Take 1 tablet by mouth daily.   predniSONE 1 MG tablet Commonly known as:  DELTASONE Take 3 mg by mouth daily.   sertraline 100 MG tablet Commonly known as:  ZOLOFT TAKE 1 TABLET BY MOUTH EVERY DAY   UNABLE TO FIND Med Name: 120 SAL AC 30% in Petrolatum cream. Apply to right and left cheek dark spots at bedtime.      ROS was provided with assistance of staff Review of Systems  Constitutional: Negative for activity change, appetite change, chills, diaphoresis, fatigue, fever and unexpected weight change.  HENT: Positive for hearing loss. Negative for congestion and voice change.   Respiratory: Negative for cough, shortness of breath and wheezing.   Cardiovascular: Negative for chest pain, palpitations and leg swelling.  Gastrointestinal: Negative for abdominal distention, abdominal pain, constipation, diarrhea and nausea.  Genitourinary: Negative for difficulty urinating, dysuria and urgency.  Musculoskeletal: Positive for arthralgias and gait problem.  Skin: Negative for color change and pallor.       Left hand skin tear, well approximated  Neurological: Negative for dizziness, speech difficulty, weakness and headaches.       Memory lapses.   Psychiatric/Behavioral: Positive for behavioral problems and confusion. Negative for agitation and sleep disturbance. The patient is nervous/anxious.     Immunization History  Administered Date(s) Administered  . Influenza Whole 05/28/2012  . Influenza, High Dose Seasonal PF 06/08/2016   . Influenza-Unspecified 05/28/2014, 08/28/2014, 05/27/2015, 06/18/2017  . Pneumococcal Conjugate-13 01/21/2014  . Pneumococcal Polysaccharide-23 04/16/2015  . Td 09/29/2003  . Zoster 01/14/2011   Pertinent  Health Maintenance Due  Topic Date Due  . INFLUENZA VACCINE  03/28/2018  . DEXA SCAN  Completed  . PNA vac Low Risk Adult  Completed   Fall Risk  05/10/2017 05/01/2017 07/17/2016 06/09/2015 05/20/2015  Falls in the past year? No No Yes No No  Comment - - tripped over the last stair in the house - -  Number falls in past yr: - - 1 - -   Functional Status Survey:    Vitals:   05/02/18 1114  BP: 124/70  Pulse: 66  Resp: 16  Temp: (!) 97.3 F (36.3 C)  SpO2: 97%  Weight: 175 lb (79.4 kg)  Height: 5\' 4"  (1.626 m)   Body mass index is 30.04 kg/m. Physical  Exam  Constitutional: She appears well-developed and well-nourished.  HENT:  Head: Normocephalic and atraumatic.  Eyes: Pupils are equal, round, and reactive to light. EOM are normal.  Neck: Normal range of motion. Neck supple. No JVD present. No thyromegaly present.  Cardiovascular: Normal rate and regular rhythm.  No murmur heard. Pulmonary/Chest: Effort normal. She has no wheezes. She has no rales.  Abdominal: Soft. Bowel sounds are normal. She exhibits no distension. There is no tenderness. There is no rebound.  Musculoskeletal: She exhibits no edema.  Self transfer, ambulates with walker.   Neurological: She is alert. No cranial nerve deficit. She exhibits normal muscle tone. Coordination normal.  Oriented to person and her room on unit.   Skin: Skin is warm and dry.  Left hand skin tear, well approximated, no s/s of infection  Psychiatric: She has a normal mood and affect.    Labs reviewed: Recent Labs    01/18/18 01/24/18 1227 04/09/18  NA 139 140 139  K 3.9 4.9 3.9  CL 102 102 106  CO2 26 30 28   GLUCOSE  --  146*  --   BUN 18 20 18   CREATININE 0.7 0.67 0.6  CALCIUM 10.0 9.8 9.2   Recent Labs     01/18/18 01/24/18 1227 04/09/18  AST 22 42* 19  ALT 19 32 16  ALKPHOS 75 73 67  BILITOT  --  1.3*  --   PROT  --  7.5 5.6  ALBUMIN 4.4 4.5 3.7   Recent Labs    01/18/18 01/24/18 1227 04/09/18  WBC 9.4 10.6* 7.7  NEUTROABS  --  8.4*  --   HGB 15.8 16.4* 13.7  HCT 47* 48.3* 41  MCV  --  87.0  --   PLT 236 142* 218   Lab Results  Component Value Date   TSH 2.77 04/09/2018   Lab Results  Component Value Date   HGBA1C 5.3 04/26/2017   Lab Results  Component Value Date   CHOL 217 (A) 04/09/2018   HDL 51 04/09/2018   LDLCALC 147 04/09/2018   LDLDIRECT 173.0 08/07/2012   TRIG 86 04/09/2018   CHOLHDL 5 03/20/2016    Significant Diagnostic Results in last 30 days:  No results found.  Assessment/Plan  Late onset Alzheimer's disease without behavioral disturbance Memory recall difficulty noted, under evaluation by Dr. Anne Hahn, MMSE 20/30 05/04/17. Will update MMSE. Hx of intolerance of Namenda and Exelon.   Depression with anxiety The patient exhibited some anxiety in evenings in setting of progression of dementia and hx of depression. Will increase Sertraline 125mg  po qd. Observe.     Family/ staff Communication: plan of care reviewed with the patient and charge nurse.   Labs/tests ordered: none  Time spend 25 minutes.

## 2018-05-02 NOTE — Assessment & Plan Note (Signed)
The patient exhibited some anxiety in evenings in setting of progression of dementia and hx of depression. Will increase Sertraline 125mg  po qd. Observe.

## 2018-05-17 ENCOUNTER — Non-Acute Institutional Stay: Payer: Medicare Other

## 2018-05-17 DIAGNOSIS — Z0000 Encounter for general adult medical examination without abnormal findings: Secondary | ICD-10-CM | POA: Diagnosis not present

## 2018-05-17 NOTE — Progress Notes (Signed)
Subjective:   Tricia Mendez is a 84 y.o. female who presents for Medicare Annual (Subsequent) preventive examination at [facility]  Last AWV-05/10/2017    Objective:     Vitals: BP 125/72 (BP Location: Left Arm, Patient Position: Supine)   Pulse 66   Temp 98.1 F (36.7 C) (Oral)   Ht 5\' 4"  (1.626 m)   Wt 175 lb (79.4 kg)   BMI 30.04 kg/m   Body mass index is 30.04 kg/m.  Advanced Directives 05/17/2018 02/21/2018 01/18/2018 08/24/2017 05/31/2017 05/18/2017 05/10/2017  Does Patient Have a Medical Advance Directive? Yes Yes Yes Yes Yes Yes No  Type of Advance Directive Out of facility DNR (pink MOST or yellow form) Out of facility DNR (pink MOST or yellow form) Out of facility DNR (pink MOST or yellow form) Out of facility DNR (pink MOST or yellow form) Out of facility DNR (pink MOST or yellow form) Out of facility DNR (pink MOST or yellow form) -  Does patient want to make changes to medical advance directive? No - Patient declined No - Patient declined No - Patient declined No - Patient declined No - Patient declined No - Patient declined No - Patient declined  Copy of Healthcare Power of Attorney in Chart? - - - - - - -  Would patient like information on creating a medical advance directive? - - - - No - Patient declined - No - Patient declined  Pre-existing out of facility DNR order (yellow form or pink MOST form) Pink MOST form placed in chart (order not valid for inpatient use) Pink MOST form placed in chart (order not valid for inpatient use) Pink MOST form placed in chart (order not valid for inpatient use) Yellow form placed in chart (order not valid for inpatient use);Pink MOST form placed in chart (order not valid for inpatient use) Yellow form placed in chart (order not valid for inpatient use) Yellow form placed in chart (order not valid for inpatient use) -    Tobacco Social History   Tobacco Use  Smoking Status Former Smoker  . Start date:  08/07/1944  . Last attempt to quit: 10/13/1945  . Years since quitting: 72.6  Smokeless Tobacco Former Neurosurgeon  . Quit date: 10/01/1945     Counseling given: Not Answered   Clinical Intake:  Pre-visit preparation completed: No  Pain : No/denies pain     Nutritional Risks: Other (Comment) Diabetes: No  How often do you need to have someone help you when you read instructions, pamphlets, or other written materials from your doctor or pharmacy?: 3 - Sometimes  Interpreter Needed?: No  Information entered by :: Tyron Russell. RN  Past Medical History:  Diagnosis Date  . Acute bronchitis 06/27/2015  . Alzheimer's dementia   . Cataract   . Cellulitis of left leg   . Depression   . Eczema   . High cholesterol   . History of renal stone    excision  . Hypertension   . Mild cognitive impairment   . Osteoarthritis   . Polymyalgia rheumatica (HCC)    rx with prednisone  . Urinary incontinence   . Varicose veins   . Xerosis of skin    Past Surgical History:  Procedure Laterality Date  . cataract surgery    . EYE SURGERY    . JOINT REPLACEMENT Bilateral    Dr. Trudee Grip  . kidney stone surgey    . lipoma removal     Family  History  Problem Relation Age of Onset  . Melanoma Mother        [id number]  . Breast cancer Mother        [id number]  . Stroke Father        [id number]  . Arthritis Father        [id number]  . Diabetes Father [id number]       borderline diabetic  . Memory loss Sister   . Osteoporosis Other    Social History   Socioeconomic History  . Marital status: Widowed    Spouse name: Not on file  . Number of children: 3  . Years of education: 1 yr college  . Highest education level: Not on file  Occupational History  . Occupation: Retired Data processing managerteacher  Social Needs  . Financial resource strain: Not hard at all  . Food insecurity:    Worry: Never true    Inability: Never true  . Transportation needs:    Medical: No    Non-medical: No  Tobacco Use  . Smoking status: Former Smoker     Start date: 08/07/1944    Last attempt to quit: 10/13/1945    Years since quitting: 72.6  . Smokeless tobacco: Former NeurosurgeonUser    Quit date: 10/01/1945  Substance and Sexual Activity  . Alcohol use: No    Alcohol/week: 6.0 - 7.0 standard drinks    Types: 6 - 7 Standard drinks or equivalent per week  . Drug use: No  . Sexual activity: Not on file  Lifestyle  . Physical activity:    Days per week: 0 days    Minutes per session: 0 min  . Stress: Not at all  Relationships  . Social connections:    Talks on phone: More than three times a week    Gets together: More than three times a week    Attends religious service: Never    Active member of club or organization: No    Attends meetings of clubs or organizations: Never    Relationship status: Widowed  Other Topics Concern  . Not on file  Social History Narrative   Widowed remote hx of tobacco x 4 years   [facility] [place]   [id number]   Daughters have HCPOA  If needed      Diet: Regular, Healthy Foods      Do you drink/ eat things with caffeine? Coffee in a.m. Approx. 2 cups per day.      Right-handed      Marital status: Married/Widowed                             What year were you married ? Twice, 1st 1948, 2nd 1992      Do you live in a house, apartment,assistred living, condo, trailer, etc.)? Apartment      Is it one or more stories? No      How many persons live in your home ? 1      Do you have any pets in your home ?(please list) No      Current or past profession: Research officer, trade unionTeachers Assistant       Do you exercise? Yes                             Type & how often: Walk daily around facility.      Do you have a living  will? Yes      Do you have a DNR form? Yes                      If not, do you want to discuss one? Yes      Do you have signed POA?HPOA forms?  Yes               If so, please bring to your        appointment          Outpatient Encounter Medications as of 05/17/2018  Medication Sig    . acetaminophen (TYLENOL) 500 MG tablet Take 2 tablets (1,000 mg total) by mouth every 8 (eight) hours as needed.  Marland Kitchen atenolol (TENORMIN) 50 MG tablet Take 1 tablet (50 mg total) by mouth daily.  Marland Kitchen azithromycin (AZASITE) 1 % ophthalmic solution Place 1 drop into both eyes at bedtime.  . calcium carbonate (OSCAL) 1500 (600 Ca) MG TABS tablet Take 600 mg of elemental calcium by mouth 2 (two) times daily with a meal.  . carbamide peroxide (DEBROX) 6.5 % OTIC solution 5 drops 2 (two) times daily. For 3 nights  . carboxymethylcellulose (REFRESH PLUS) 0.5 % SOLN Place 1 drop into both eyes 4 (four) times daily.   . cholecalciferol (VITAMIN D) 1000 units tablet Take 1,000 Units by mouth daily.  . Eyelid Cleansers (AVENOVA EX) Apply 1 application topically 2 (two) times daily.  . hydrocortisone cream 1 % Apply 1 application topically as needed. Apply to the chest   . Multiple Vitamin (MULTIVITAMIN WITH MINERALS) TABS tablet Take 1 tablet by mouth daily.  . Omega-3 Fatty Acids (FISH OIL) 1200 MG CAPS Take 1 capsule by mouth daily.   . predniSONE (DELTASONE) 1 MG tablet Take 3 mg by mouth daily.   . sertraline (ZOLOFT) 100 MG tablet TAKE 1 TABLET BY MOUTH EVERY DAY  . UNABLE TO FIND Med Name: 120 SAL AC 30% in Petrolatum cream. Apply to right and left cheek dark spots at bedtime.   No facility-administered encounter medications on file as of 05/17/2018.     Activities of Daily Living In your present state of health, do you have any difficulty performing the following activities: 05/17/2018  Hearing? Y  Vision? N  Difficulty concentrating or making decisions? Y  Walking or climbing stairs? Y  Dressing or bathing? N  Doing errands, shopping? Y  Preparing Food and eating ? Y  Using the Toilet? N  In the past six months, have you accidently leaked urine? N  Do you have problems with loss of bowel control? N  Managing your Medications? Y  Managing your Finances? Y  Housekeeping or managing your  Housekeeping? Y  Some recent data might be hidden    Patient Care Team: Oneal Grout, MD as PCP - General (Internal Medicine) Donnetta Hail, MD (Rheumatology) Ernesto Rutherford, MD (Ophthalmology) Nita Sells, MD (Dermatology) Crist Fat, MD as Attending Physician (Urology) Mast, Man X, NP as Nurse Practitioner (Internal Medicine)    Assessment:   This is a routine wellness examination for Tricia Mendez.  Exercise Activities and Dietary recommendations Current Exercise Habits: The patient does not participate in regular exercise at present, Exercise limited by: orthopedic condition(s);neurologic condition(s)  Goals   None     Fall Risk Fall Risk  05/17/2018 05/10/2017 05/01/2017 07/17/2016 06/09/2015  Falls in the past year? No No No Yes No  Comment - - - tripped over the last stair in the  house -  Number falls in past yr: - - - 1 -   Is the patient's home free of loose throw rugs in walkways, pet beds, electrical cords, etc?   yes      Grab bars in the bathroom? yes      Handrails on the stairs?   yes      Adequate lighting?   yes  Depression Screen PHQ 2/9 Scores 05/17/2018 05/10/2017 07/17/2016 06/19/2015  PHQ - 2 Score 0 0 0 0     Cognitive Function MMSE - Mini Mental State Exam 05/17/2018 05/08/2017 05/08/2017 05/04/2017 05/01/2017  Orientation to time 0 0 0 4 2  Orientation to Place 2 0 0 4 5  Registration 3 3 3  0 3  Attention/ Calculation 0 3 3 5  0  Recall 0 0 0 0 0  Language- name 2 objects 2 2 2 2 2   Language- repeat 1 1 1  0 0  Language- follow 3 step command 3 3 3 3 2   Language- read & follow direction 1 1 1 1 1   Write a sentence 0 1 1 1 1   Copy design 0 0 0 0 0  Total score 12 14 14 20 16         Immunization History  Administered Date(s) Administered  . Influenza Whole 05/28/2012  . Influenza, High Dose Seasonal PF 06/08/2016  . Influenza-Unspecified 05/28/2014, 08/28/2014, 05/27/2015, 06/18/2017  . Pneumococcal Conjugate-13 01/21/2014  . Pneumococcal  Polysaccharide-23 04/16/2015  . Td 09/29/2003  . Zoster 01/14/2011    Qualifies for Shingles Vaccine? Not in past records  Screening Tests Health Maintenance  Topic Date Due  . INFLUENZA VACCINE  03/28/2018  . TETANUS/TDAP  04/17/2021  . DEXA SCAN  Completed  . PNA vac Low Risk Adult  Completed    Cancer Screenings: Lung: Low Dose CT Chest recommended if Age 26-80 years, 30 pack-year currently smoking OR have quit w/in 15years. Patient does not qualify. Breast:  Up to date on Mammogram? Yes   Up to date of Bone Density/Dexa? Yes Colorectal: up to date  Additional Screenings:  Hepatitis C Screening:  Unable to appropriately accept or decline Flu vaccine due: will receive at [facility]     Plan:    I have personally reviewed and addressed the Medicare Annual Wellness questionnaire and have noted the following in the patient's chart:  A. Medical and social history B. Use of alcohol, tobacco or illicit drugs  C. Current medications and supplements D. Functional ability and status E.  Nutritional status F.  Physical activity G. Advance directives H. List of other physicians I.  Hospitalizations, surgeries, and ER visits in previous 12 months J.  Vitals K. Screenings to include hearing, vision, cognitive, depression L. Referrals and appointments - none  In addition, I have reviewed and discussed with patient certain preventive protocols, quality metrics, and best practice recommendations. A written personalized care plan for preventive services as well as general preventive health recommendations were provided to patient.  See attached scanned questionnaire for additional information.   Signed,   Tyron Russell, RN Nurse Health Advisor  Patient Concerns: None

## 2018-05-17 NOTE — Patient Instructions (Addendum)
[name] , Thank you for taking time to come for your Medicare Wellness Visit. I appreciate your ongoing commitment to your health goals. Please review the following plan we discussed and let me know if I can assist you in the future.   Screening recommendations/referrals: Colonoscopy excluded, over age 82 Mammogram excluded, over age 82 Bone Density up to date Recommended yearly ophthalmology/optometry visit for glaucoma screening and checkup Recommended yearly dental visit for hygiene and checkup  Vaccinations: Influ[facility] vaccine due, [facility] Pneumococcal vaccine up to date, completed Tdap vaccine up to date, 04/17/2021 Shingles vaccine not in past records    Advanced directives: in chart  Conditions/risks identified: none  Next appointment: Dr. Glade Mendez makes rounds   Preventive Care 65 Years and Older, Female Preventive care refers to lifestyle choices and visits with your health care provider that can promote health and wellness. What does preventive care include?  A yearly physical exam. This is also called an annual well check.  Dental exams once or twice a year.  Routine eye exams. Ask your health care provider how often you should have your eyes checked.  Personal lifestyle choices, including:  Daily care of your teeth and gums.  Regular physical activity.  Eating a healthy diet.  Avoiding tobacco and drug use.  Limiting alcohol use.  Practicing safe sex.  Taking low-dose aspirin every day.  Taking vitamin and mineral supplements as recommended by your health care provider. What happens during an annual well check? The services and screenings done by your health care provider during your annual well check will depend on your age, overall health, lifestyle risk factors, and family history of disease. Counseling  Your health care provider may ask you questions about your:  Alcohol use.  Tobacco use.  Drug use.  Emotional  well-being.  Home and relationship well-being.  Sexual activity.  Eating habits.  History of falls.  Memory and ability to understand (cognition).  Work and work Astronomerenvironment.  Reproductive health. Screening  You may have the following tests or measurements:  Height, weight, and BMI.  Blood pressure.  Lipid and cholesterol levels. These may be checked every 5 years, or more frequently if you are over 82 years old.  Skin check.  Lung cancer screening. You may have this screening every year starting at age 82 if you have a 30-pack-year history of smoking and currently smoke or have quit within the past 15 years.  Fecal occult blood test (FOBT) of the stool. You may have this test every year starting at age 82.  Flexible sigmoidoscopy or colonoscopy. You may have a sigmoidoscopy every 5 years or a colonoscopy every 10 years starting at age 82.  Hepatitis C blood test.  Hepatitis B blood test.  Sexually transmitted disease (STD) testing.  Diabetes screening. This is done by checking your blood sugar (glucose) after you have not eaten for a while (fasting). You may have this done every 1-3 years.  Bone density scan. This is done to screen for osteoporosis. You may have this done starting at age 82.  Mammogram. This may be done every 1-2 years. Talk to your health care provider about how often you should have regular mammograms. Talk with your health care provider about your test results, treatment options, and if necessary, the need for more tests. Vaccines  Your health care provider may recommend certain vaccines, such as:  Influenza vaccine. This is recommended every year.  Tetanus, diphtheria, and acellular pertussis (Tdap, Td) vaccine. You  may need a Td booster every 10 years.  Zoster vaccine. You may need this after age 27.  Pneumococcal 13-valent conjugate (PCV13) vaccine. One dose is recommended after age 30.  Pneumococcal polysaccharide (PPSV23) vaccine. One  dose is recommended after age 65. Talk to your health care provider about which screenings and vaccines you need and how often you need them. This information is not intended to replace advice given to you by your health care provider. Make sure you discuss any questions you have with your health care provider. Document Released: 09/10/2015 Document Revised: 05/03/2016 Document Reviewed: 06/15/2015 Elsevier Interactive Patient Education  2017 [place] Prevention in the Home Falls can cause injuries. They can happen to people of all ages. There are many things you can do to make your home safe and to help prevent falls. What can I do on the outside of my home?  Regularly fix the edges of walkways and driveways and fix any cracks.  Remove anything that might make you trip as you walk through a door, such as a raised step or threshold.  Trim any bushes or trees on the path to your home.  Use bright outdoor lighting.  Clear any walking paths of anything that might make someone trip, such as rocks or tools.  Regularly check to see if handrails are loose or broken. Make sure that both sides of any steps have handrails.  Any raised decks and porches should have guardrails on the edges.  Have any leaves, snow, or ice cleared regularly.  Use sand or salt on walking paths during winter.  Clean up any spills in your garage right away. This includes oil or grease spills. What can I do in the bathroom?  Use night lights.  Install grab bars by the toilet and in the tub and shower. Do not use towel bars as grab bars.  Use non-skid mats or decals in the tub or shower.  If you need to sit down in the shower, use a plastic, non-slip stool.  Keep the floor dry. Clean up any water that spills on the floor as soon as it happens.  Remove soap buildup in the tub or shower regularly.  Attach bath mats securely with double-sided non-slip rug tape.  Do not have throw rugs and other  things on the floor that can make you trip. What can I do in the bedroom?  Use night lights.  Make sure that you have a light by your bed that is easy to reach.  Do not use any sheets or blankets that are too big for your bed. They should not hang down onto the floor.  Have a firm chair that has side arms. You can use this for support while you get dressed.  Do not have throw rugs and other things on the floor that can make you trip. What can I do in the kitchen?  Clean up any spills right away.  Avoid walking on wet floors.  Keep items that you use a lot in easy-to-reach places.  If you need to reach something above you, use a strong step stool that has a grab bar.  Keep electrical cords out of the way.  Do not use floor polish or wax that makes floors slippery. If you must use wax, use non-skid floor wax.  Do not have throw rugs and other things on the floor that can make you trip. What can I do with my stairs?  Do not leave any items  on the stairs.  Make sure that there are handrails on both sides of the stairs and use them. Fix handrails that are broken or loose. Make sure that handrails are as long as the stairways.  Check any carpeting to make sure that it is firmly attached to the stairs. Fix any carpet that is loose or worn.  Avoid having throw rugs at the top or bottom of the stairs. If you do have throw rugs, attach them to the floor with carpet tape.  Make sure that you have a light switch at the top of the stairs and the bottom of the stairs. If you do not have them, ask someone to add them for you. What else can I do to help prevent falls?  Wear shoes that:  Do not have high heels.  Have rubber bottoms.  Are comfortable and fit you well.  Are closed at the toe. Do not wear sandals.  If you use a stepladder:  Make sure that it is fully opened. Do not climb a closed stepladder.  Make sure that both sides of the stepladder are locked into place.  Ask  someone to hold it for you, if possible.  Clearly mark and make sure that you can see:  Any grab bars or handrails.  First and last steps.  Where the edge of each step is.  Use tools that help you move around (mobility aids) if they are needed. These include:  Canes.  Walkers.  Scooters.  Crutches.  Turn on the lights when you go into a dark area. Replace any light bulbs as soon as they burn out.  Set up your furniture so you have a clear path. Avoid moving your furniture around.  If any of your floors are uneven, fix them.  If there are any pets around you, be aware of where they are.  Review your medicines with your doctor. Some medicines can make you feel dizzy. This can increase your chance of falling. Ask your doctor what other things that you can do to help prevent falls. This information is not intended to replace advice given to you by your health care provider. Make sure you discuss any questions you have with your health care provider. Document Released: 06/10/2009 Document Revised: 01/20/2016 Document Reviewed: 09/18/2014 Elsevier Interactive Patient Education  2017 [employer].

## 2018-05-22 ENCOUNTER — Non-Acute Institutional Stay: Payer: Medicare Other | Admitting: Nurse Practitioner

## 2018-05-22 ENCOUNTER — Encounter: Payer: Self-pay | Admitting: Nurse Practitioner

## 2018-05-22 DIAGNOSIS — F418 Other specified anxiety disorders: Secondary | ICD-10-CM

## 2018-05-22 DIAGNOSIS — F0280 Dementia in other diseases classified elsewhere without behavioral disturbance: Secondary | ICD-10-CM

## 2018-05-22 DIAGNOSIS — I10 Essential (primary) hypertension: Secondary | ICD-10-CM

## 2018-05-22 DIAGNOSIS — G301 Alzheimer's disease with late onset: Secondary | ICD-10-CM | POA: Diagnosis not present

## 2018-05-22 DIAGNOSIS — M353 Polymyalgia rheumatica: Secondary | ICD-10-CM

## 2018-05-22 DIAGNOSIS — Z7189 Other specified counseling: Secondary | ICD-10-CM

## 2018-05-22 NOTE — Progress Notes (Addendum)
Location:  [facility] Room Number: [id number] Place of Service:  ALF [phone]) Provider:  Ruthene Methvin, Arna Snipe  NP  Oneal Grout, MD  Patient Care Team: Oneal Grout, MD as PCP - General (Internal Medicine) Donnetta Hail, MD (Rheumatology) Ernesto Rutherford, MD (Ophthalmology) Nita Sells, MD (Dermatology) Crist Fat, MD as Attending Physician (Urology) Izreal Kock X, NP as Nurse Practitioner (Internal Medicine)  Extended Emergency Contact Information Primary Emergency Contact: McClanahan,Susan Address: [address] [place], [place] [address] [place] of [place] Home Phone: [phone] Mobile Phone: [phone] Relation: Daughter Secondary Emergency Contact: Gardiner Sleeper [place] of [place] Home Phone: [phone] Relation: Daughter  Code Status:  DNR Goals of care: Advanced Directive information Advanced Directives 05/17/2018  Does Patient Have a Medical Advance Directive? Yes  Type of Advance Directive Out of facility DNR (pink MOST or yellow form)  Does patient want to make changes to medical advance directive? No - Patient declined  Copy of Healthcare Power of Attorney in Chart? -  Would patient like information on creating a medical advance directive? -  Pre-existing out of facility DNR order (yellow form or pink MOST form) Pink MOST form placed in chart (order not valid for inpatient use)     Chief Complaint  Patient presents with  . Medical Management of Chronic Issues    ACP    HPI:  Pt is a 84 y.o. female seen today for evaluation of chronic medical conditions and goals of care discussion. The patient has history of dementia, resides in [facility] for safety and care assistance, failed Memantine and Donepezil due to intolerance/allergy.   She has history of anxiety/deprssion, most emotional outbursts are in pm, able to be re-directed, on Sertraline 125mg  daily. She takes Prednisone 3mg  for polymyalgia rheumatica. Hx  of HTN, blood pressure is controlled on Atneolol 50mg  qd   Past Medical History:  Diagnosis Date  . Acute bronchitis 06/27/2015  . Alzheimer's dementia (HCC)   . Cataract   . Cellulitis of left leg   . Depression   . Eczema   . High cholesterol   . History of renal stone    excision  . Hypertension   . Mild cognitive impairment   . Osteoarthritis   . Polymyalgia rheumatica (HCC)    rx with prednisone  . Urinary incontinence   . Varicose veins   . Xerosis of skin    Past Surgical History:  Procedure Laterality Date  . cataract surgery    . EYE SURGERY    . JOINT REPLACEMENT Bilateral    Dr. Trudee Grip  . kidney stone surgey    . lipoma removal      Allergies  Allergen Reactions  . Donepezil Other (See Comments)    BAD DREAMS  . Namenda [Memantine]   . Remeron [Mirtazapine] Other (See Comments)    Dizzy     Outpatient Encounter Medications as of 05/22/2018  Medication Sig  . acetaminophen (TYLENOL) 500 MG tablet Take 2 tablets (1,000 mg total) by mouth every 8 (eight) hours as needed.  Marland Kitchen atenolol (TENORMIN) 50 MG tablet Take 1 tablet (50 mg total) by mouth daily.  Marland Kitchen azithromycin (AZASITE) 1 % ophthalmic solution Place 1 drop into both eyes at bedtime.  . calcium carbonate (OSCAL) 1500 (600 Ca) MG TABS tablet Take 600 mg of elemental calcium by mouth 2 (two) times daily with a meal.  . carboxymethylcellulose (REFRESH PLUS) 0.5 %  SOLN Place 1 drop into both eyes 4 (four) times daily.   . cholecalciferol (VITAMIN D) 1000 units tablet Take 1,000 Units by mouth daily.  . Eyelid Cleansers (AVENOVA EX) Apply 1 application topically 2 (two) times daily.  . hydrocortisone cream 1 % Apply 1 application topically as needed. Apply to the chest   . Multiple Vitamin (MULTIVITAMIN WITH MINERALS) TABS tablet Take 1 tablet by mouth daily.  . Omega-3 Fatty Acids (FISH OIL) 1200 MG CAPS Take 1 capsule by mouth daily.   . predniSONE (DELTASONE) 1 MG tablet Take 3 mg by mouth daily.     . sertraline (ZOLOFT) 100 MG tablet TAKE 1 TABLET BY MOUTH EVERY DAY (Patient taking differently: 100 mg+25 mg = 125 mg)  . sertraline (ZOLOFT) 25 MG tablet Take 25 mg by mouth daily. 100 mg +25 mg= 125 mg  . UNABLE TO FIND Med Name: 120 SAL AC 30% in Petrolatum cream. Apply to right and left cheek dark spots at bedtime.  . [DISCONTINUED] carbamide peroxide (DEBROX) 6.5 % OTIC solution 5 drops 2 (two) times daily. For 3 nights   No facility-administered encounter medications on file as of 05/22/2018.     Review of Systems  Constitutional: Negative for activity change, appetite change, chills, diaphoresis, fatigue and fever.  HENT: Positive for hearing loss. Negative for congestion and voice change.   Respiratory: Negative for cough and shortness of breath.   Gastrointestinal: Negative for abdominal distention and abdominal pain.  Genitourinary: Negative for difficulty urinating, dysuria and urgency.  Musculoskeletal: Positive for arthralgias and gait problem.  Skin: Negative for color change and pallor.  Neurological: Negative for dizziness, speech difficulty, weakness and headaches.       Memory lapses.   Psychiatric/Behavioral: Positive for confusion. Negative for agitation, behavioral problems and sleep disturbance. The patient is not nervous/anxious.        Down symptoms.     Immunization History  Administered Date(s) Administered  . Influenza Whole 05/28/2012  . Influenza, High Dose Seasonal PF 06/08/2016  . Influenza-Unspecified 05/28/2014, 08/28/2014, 05/27/2015, 06/18/2017  . Pneumococcal Conjugate-13 01/21/2014  . Pneumococcal Polysaccharide-23 04/16/2015  . Td 09/29/2003  . Zoster 01/14/2011   Pertinent  Health Maintenance Due  Topic Date Due  . INFLUENZA VACCINE  03/28/2018  . DEXA SCAN  Completed  . PNA vac Low Risk Adult  Completed   Fall Risk  05/17/2018 05/10/2017 05/01/2017 07/17/2016 06/09/2015  Falls in the past year? No No No Yes No  Comment - - - tripped over  the last stair in the house -  Number falls in past yr: - - - 1 -   Functional Status Survey:    Vitals:   05/22/18 1126  BP: 128/70  Pulse: 78  Resp: 20  Temp: 98.6 F (37 C)  SpO2: 94%  Weight: 175 lb (79.4 kg)  Height: 5\' 4"  (1.626 m)   Body mass index is 30.04 kg/m. Physical Exam  Constitutional: She appears well-developed and well-nourished.  HENT:  Head: Normocephalic and atraumatic.  Eyes: Pupils are equal, round, and reactive to light. EOM are normal.  Neck: Normal range of motion. Neck supple. No JVD present. No thyromegaly present.  Cardiovascular: Normal rate and regular rhythm.  No murmur heard. Pulmonary/Chest: Effort normal. She has no wheezes. She has no rales.  Abdominal: Soft. She exhibits no distension. There is no tenderness. There is no rebound and no guarding.  Musculoskeletal: Normal range of motion. She exhibits no edema.  Self transfer. Ambulates with  walker.   Neurological: She is alert. No cranial nerve deficit. She exhibits normal muscle tone. Coordination normal.  Oriented to person and place.   Skin: Skin is warm and dry.  Psychiatric: She has a normal mood and affect. Her behavior is normal.    Labs reviewed: Recent Labs    01/18/18 01/24/18 1227 04/09/18  NA 139 140 139  K 3.9 4.9 3.9  CL 102 102 106  CO2 26 30 28   GLUCOSE  --  146*  --   BUN 18 20 18   CREATININE 0.7 0.67 0.6  CALCIUM 10.0 9.8 9.2   Recent Labs    01/18/18 01/24/18 1227 04/09/18  AST 22 42* 19  ALT 19 32 16  ALKPHOS 75 73 67  BILITOT  --  1.3*  --   PROT  --  7.5 5.6  ALBUMIN 4.4 4.5 3.7   Recent Labs    01/18/18 01/24/18 1227 04/09/18  WBC 9.4 10.6* 7.7  NEUTROABS  --  8.4*  --   HGB 15.8 16.4* 13.7  HCT 47* 48.3* 41  MCV  --  87.0  --   PLT 236 142* 218   Lab Results  Component Value Date   TSH 2.77 04/09/2018   Lab Results  Component Value Date   HGBA1C 5.3 04/26/2017   Lab Results  Component Value Date   CHOL 217 (A) 04/09/2018   HDL  51 04/09/2018   LDLCALC 147 04/09/2018   LDLDIRECT 173.0 08/07/2012   TRIG 86 04/09/2018   CHOLHDL 5 03/20/2016    Significant Diagnostic Results in last 30 days:  No results found.  Assessment/Plan: Counseling regarding advanced care planning and goals of care Goals of care discussion: reviewed goals of care with the patient's HPOA daughter. Social worker also present. DNR reviewed. Went over and filled out MOST for between 3:30pm-3:55pm. The patient desires DNR when the patient has no pulse and is not breathing. She agrees to be transferred to hospital if indicated with limited additional intervention, avoid intensive care. She would like to determine use or limitation of antibiotics when infection occurs and IV fluid for a defined trail period. No feeding tube. Form signed by the patient's representative-HPOA-daughter, social worker, and myself. Copies made for chart and family.    Late onset Alzheimer's disease without behavioral disturbance MMSE 20/30 05/04/17, continue [facility] for safety and care assistance. Failed Memantine and Donepezil.   Depression with anxiety She has history of anxiety/deprssion, most emotional outbursts are in pm, able to be re-directed, continue Sertraline 125mg  daily.   Polymyalgia rheumatica (HCC) She takes Prednisone 3mg  for myalgias rheumatica.   Essential hypertension, benign Hx of HTN, blood pressure is controlled, continue  Atneolol 50mg  qd      Family/ staff Communication: plan of care reviewed with the patient, HPOA daughter, Child psychotherapist, and Press photographer.   Labs/tests ordered:  none  Time spend 25 minutes.

## 2018-05-23 NOTE — Assessment & Plan Note (Addendum)
Goals of care discussion: reviewed goals of care with the patient's HPOA daughter. Social worker also present. DNR reviewed. Went over and filled out MOST for between 3:30pm-3:55pm. The patient desires DNR when the patient has no pulse and is not breathing. She agrees to be transferred to hospital if indicated with limited additional intervention, avoid intensive care. She would like to determine use or limitation of antibiotics when infection occurs and IV fluid for a defined trail period. No feeding tube. Form signed by the patient's representative-HPOA-daughter, social worker, and myself. Copies made for chart and family.

## 2018-05-24 NOTE — Assessment & Plan Note (Signed)
MMSE 20/30 05/04/17, continue [facility] for safety and care assistance. Failed Memantine and Donepezil.

## 2018-05-27 ENCOUNTER — Encounter: Payer: Self-pay | Admitting: Nurse Practitioner

## 2018-05-28 ENCOUNTER — Encounter: Payer: Self-pay | Admitting: Nurse Practitioner

## 2018-05-28 NOTE — Assessment & Plan Note (Signed)
She takes Prednisone 3mg  for myalgias rheumatica.

## 2018-05-28 NOTE — Assessment & Plan Note (Signed)
She has history of anxiety/deprssion, most emotional outbursts are in pm, able to be re-directed, continue Sertraline 125mg  daily.

## 2018-05-28 NOTE — Assessment & Plan Note (Signed)
Hx of HTN, blood pressure is controlled, continue  Atneolol 50mg  qd

## 2018-06-13 ENCOUNTER — Encounter: Payer: Self-pay | Admitting: Nurse Practitioner

## 2018-06-13 ENCOUNTER — Non-Acute Institutional Stay: Payer: Medicare Other | Admitting: Nurse Practitioner

## 2018-06-13 DIAGNOSIS — F0280 Dementia in other diseases classified elsewhere without behavioral disturbance: Secondary | ICD-10-CM | POA: Diagnosis not present

## 2018-06-13 DIAGNOSIS — S8012XA Contusion of left lower leg, initial encounter: Secondary | ICD-10-CM | POA: Diagnosis not present

## 2018-06-13 DIAGNOSIS — G301 Alzheimer's disease with late onset: Secondary | ICD-10-CM

## 2018-06-13 NOTE — Progress Notes (Signed)
Location:  [facility] Room Number: [phone] Place of Service:  ALF [phone]) Provider:  Chipper Oman  NP  Marilyne Haseley X, NP  Patient Care Team: Shenandoah Yeats X, NP as PCP - General (Internal Medicine) Donnetta Hail, MD (Rheumatology) Ernesto Rutherford, MD (Ophthalmology) Nita Sells, MD (Dermatology) Crist Fat, MD as Attending Physician (Urology) Miyu Fenderson X, NP as Nurse Practitioner (Internal Medicine)  Extended Emergency Contact Information Primary Emergency Contact: McClanahan,Susan Address: [address] [place], [place] [address] [place] of [place] Home Phone: [phone] Mobile Phone: [phone] Relation: Daughter Secondary Emergency Contact: Gardiner Sleeper [place] of [place] Home Phone: [phone] Relation: Daughter  Code Status:  DNR Goals of care: Advanced Directive information Advanced Directives 06/13/2018  Does Patient Have a Medical Advance Directive? Yes  Type of Advance Directive Out of facility DNR (pink MOST or yellow form)  Does patient want to make changes to medical advance directive? No - Patient declined  Copy of Healthcare Power of Attorney in Chart? -  Would patient like information on creating a medical advance directive? -  Pre-existing out of facility DNR order (yellow form or pink MOST form) Pink MOST form placed in chart (order not valid for inpatient use)     Chief Complaint  Patient presents with  . Acute Visit    Bruise on the outer left lower leg    HPI:  Pt is a 84 y.o. female seen today for an acute visit for lateral left lower leg contusion, bruise and slightly redness were fist noted10/15/19 by staff. The patient has no recollection of the mechanism of injury. She denied pain, ambulates with walker as usual. HPI was provided with assistance of staff due to her dementia. She resides in [facility] for safety and care assistance.    Past Medical History:  Diagnosis Date  . Acute  bronchitis 06/27/2015  . Alzheimer's dementia (HCC)   . Cataract   . Cellulitis of left leg   . Depression   . Eczema   . High cholesterol   . History of renal stone    excision  . Hypertension   . Mild cognitive impairment   . Osteoarthritis   . Polymyalgia rheumatica (HCC)    rx with prednisone  . Urinary incontinence   . Varicose veins   . Xerosis of skin    Past Surgical History:  Procedure Laterality Date  . cataract surgery    . EYE SURGERY    . JOINT REPLACEMENT Bilateral    Dr. Trudee Grip  . kidney stone surgey    . lipoma removal      Allergies  Allergen Reactions  . Donepezil Other (See Comments)    BAD DREAMS  . Namenda [Memantine]   . Remeron [Mirtazapine] Other (See Comments)    Dizzy     Outpatient Encounter Medications as of 06/13/2018  Medication Sig  . acetaminophen (TYLENOL) 500 MG tablet Take 2 tablets (1,000 mg total) by mouth every 8 (eight) hours as needed.  Marland Kitchen atenolol (TENORMIN) 50 MG tablet Take 1 tablet (50 mg total) by mouth daily.  Marland Kitchen azithromycin (AZASITE) 1 % ophthalmic solution Place 1 drop into both eyes at bedtime.  . calcium carbonate (OSCAL) 1500 (600 Ca) MG TABS tablet Take 600 mg of elemental calcium by mouth 2 (two) times daily with a meal.  . carboxymethylcellulose (REFRESH PLUS) 0.5 % SOLN Place 1 drop into both eyes 4 (  four) times daily.   . cholecalciferol (VITAMIN D) 1000 units tablet Take 1,000 Units by mouth daily.  . Eyelid Cleansers (AVENOVA EX) Apply 1 application topically 2 (two) times daily.  . hydrocortisone cream 1 % Apply 1 application topically as needed. Apply to the chest   . Multiple Vitamin (MULTIVITAMIN WITH MINERALS) TABS tablet Take 1 tablet by mouth daily.  . Omega-3 Fatty Acids (FISH OIL) 1200 MG CAPS Take 1 capsule by mouth daily.   . predniSONE (DELTASONE) 1 MG tablet Take 3 mg by mouth daily.   . sertraline (ZOLOFT) 100 MG tablet TAKE 1 TABLET BY MOUTH EVERY DAY (Patient taking differently: 100 mg+25  mg = 125 mg)  . sertraline (ZOLOFT) 25 MG tablet Take 25 mg by mouth daily. 100 mg +25 mg= 125 mg  . UNABLE TO FIND Med Name: 120 SAL AC 30% in Petrolatum cream. Apply to right and left cheek dark spots at bedtime.   No facility-administered encounter medications on file as of 06/13/2018.    ROS was provided with assistance of staff Review of Systems  Constitutional: Negative for activity change, appetite change, chills, diaphoresis, fatigue and fever.  HENT: Positive for hearing loss. Negative for congestion and voice change.   Respiratory: Negative for cough, shortness of breath and wheezing.   Cardiovascular: Negative for chest pain, palpitations and leg swelling.  Gastrointestinal: Negative for abdominal distention, abdominal pain, constipation, diarrhea, nausea and vomiting.  Genitourinary: Negative for difficulty urinating, dysuria and urgency.  Musculoskeletal: Positive for arthralgias and gait problem. Negative for joint swelling.  Skin: Positive for color change. Negative for pallor.       Brownish pigmented BLE from knee down. Lateral left lower leg bruise and redness  Neurological: Negative for dizziness, speech difficulty, weakness and headaches.       Memory lapses.   Psychiatric/Behavioral: Positive for confusion. Negative for agitation, behavioral problems, hallucinations and sleep disturbance. The patient is not nervous/anxious.     Immunization History  Administered Date(s) Administered  . Influenza Whole 05/28/2012  . Influenza, High Dose Seasonal PF 06/08/2016  . Influenza-Unspecified 05/28/2014, 08/28/2014, 05/27/2015, 06/18/2017  . Pneumococcal Conjugate-13 01/21/2014  . Pneumococcal Polysaccharide-23 04/16/2015  . Td 09/29/2003  . Zoster 01/14/2011   Pertinent  Health Maintenance Due  Topic Date Due  . INFLUENZA VACCINE  03/28/2018  . DEXA SCAN  Completed  . PNA vac Low Risk Adult  Completed   Fall Risk  05/17/2018 05/10/2017 05/01/2017 07/17/2016 06/09/2015    Falls in the past year? No No No Yes No  Comment - - - tripped over the last stair in the house -  Number falls in past yr: - - - 1 -   Functional Status Survey:    Vitals:   06/13/18 1614  BP: 138/72  Pulse: 65  Resp: 20  Temp: 98.5 F (36.9 C)  SpO2: 93%  Weight: 178 lb (80.7 kg)  Height: 5\' 4"  (1.626 m)   Body mass index is 30.55 kg/m. Physical Exam  Constitutional: She appears well-developed and well-nourished.  HENT:  Head: Normocephalic and atraumatic.  Eyes: Pupils are equal, round, and reactive to light. EOM are normal.  Neck: Normal range of motion. Neck supple. No JVD present. No thyromegaly present.  Cardiovascular: Normal rate and regular rhythm.  No murmur heard. Pulmonary/Chest: Effort normal. She has no wheezes. She has no rales.  Abdominal: Soft. She exhibits no distension. There is no tenderness. There is no rebound and no guarding.  Musculoskeletal: She exhibits no  edema or tenderness.  Self tranfers. Ambulates with walker.   Neurological: She is alert. No cranial nerve deficit. She exhibits normal muscle tone. Coordination normal.  Oriented to person and place.   Skin: Skin is warm and dry. There is erythema.  Bruise and redness at the lateral of the left lower leg, slight discomfort when palpated. The area is slightly warmer than the right leg.   Psychiatric: She has a normal mood and affect. Her behavior is normal.    Labs reviewed: Recent Labs    01/18/18 01/24/18 1227 04/09/18  NA 139 140 139  K 3.9 4.9 3.9  CL 102 102 106  CO2 26 30 28   GLUCOSE  --  146*  --   BUN 18 20 18   CREATININE 0.7 0.67 0.6  CALCIUM 10.0 9.8 9.2   Recent Labs    01/18/18 01/24/18 1227 04/09/18  AST 22 42* 19  ALT 19 32 16  ALKPHOS 75 73 67  BILITOT  --  1.3*  --   PROT  --  7.5 5.6  ALBUMIN 4.4 4.5 3.7   Recent Labs    01/18/18 01/24/18 1227 04/09/18  WBC 9.4 10.6* 7.7  NEUTROABS  --  8.4*  --   HGB 15.8 16.4* 13.7  HCT 47* 48.3* 41  MCV  --  87.0   --   PLT 236 142* 218   Lab Results  Component Value Date   TSH 2.77 04/09/2018   Lab Results  Component Value Date   HGBA1C 5.3 04/26/2017   Lab Results  Component Value Date   CHOL 217 (A) 04/09/2018   HDL 51 04/09/2018   LDLCALC 147 04/09/2018   LDLDIRECT 173.0 08/07/2012   TRIG 86 04/09/2018   CHOLHDL 5 03/20/2016    Significant Diagnostic Results in last 30 days:  No results found.  Assessment/Plan Contusion of left lower leg lateral left lower leg contusion, bruise and slightly redness were fist noted10/15/19 by staff. The patient has no recollection of the mechanism of injury. She denied pain, ambulates with walker as usual. No s/s of infection or fractures, continue to observe.   Late onset Alzheimer's disease without behavioral disturbance  HPI was provided with assistance of staff due to her dementia. She resides in [facility] for safety and care assistance.      Family/ staff Communication: plan of care reviewed with the patient and charge nurse.   Time spend 25 minutes.   Labs/tests ordered:

## 2018-06-17 DIAGNOSIS — S8012XA Contusion of left lower leg, initial encounter: Secondary | ICD-10-CM | POA: Insufficient documentation

## 2018-06-17 NOTE — Assessment & Plan Note (Signed)
lateral left lower leg contusion, bruise and slightly redness were fist noted10/15/19 by staff. The patient has no recollection of the mechanism of injury. She denied pain, ambulates with walker as usual. No s/s of infection or fractures, continue to observe.

## 2018-06-17 NOTE — Assessment & Plan Note (Signed)
HPI was provided with assistance of staff due to her dementia. She resides in [facility] for safety and care assistance.

## 2018-06-18 ENCOUNTER — Encounter: Payer: Self-pay | Admitting: Nurse Practitioner

## 2018-08-06 ENCOUNTER — Non-Acute Institutional Stay: Payer: Medicare Other | Admitting: Nurse Practitioner

## 2018-08-06 ENCOUNTER — Encounter: Payer: Self-pay | Admitting: Nurse Practitioner

## 2018-08-06 DIAGNOSIS — M159 Polyosteoarthritis, unspecified: Secondary | ICD-10-CM

## 2018-08-06 DIAGNOSIS — G301 Alzheimer's disease with late onset: Secondary | ICD-10-CM | POA: Diagnosis not present

## 2018-08-06 DIAGNOSIS — I10 Essential (primary) hypertension: Secondary | ICD-10-CM | POA: Diagnosis not present

## 2018-08-06 DIAGNOSIS — M150 Primary generalized (osteo)arthritis: Secondary | ICD-10-CM

## 2018-08-06 DIAGNOSIS — F0280 Dementia in other diseases classified elsewhere without behavioral disturbance: Secondary | ICD-10-CM

## 2018-08-06 DIAGNOSIS — F418 Other specified anxiety disorders: Secondary | ICD-10-CM

## 2018-08-06 DIAGNOSIS — M353 Polymyalgia rheumatica: Secondary | ICD-10-CM

## 2018-08-06 NOTE — Assessment & Plan Note (Signed)
Blood pressure is controlled, continue Atenolol 50mg  qd.

## 2018-08-06 NOTE — Progress Notes (Signed)
Location:  [facility] Room Number: [phone] Place of Service:  ALF [phone]) Provider:  Chipper Oman  NP  Nerida Boivin X, NP  Patient Care Team: Anzleigh Slaven X, NP as PCP - General (Internal Medicine) Donnetta Hail, MD (Rheumatology) Ernesto Rutherford, MD (Ophthalmology) Nita Sells, MD (Dermatology) Crist Fat, MD as Attending Physician (Urology) Lisa Milian X, NP as Nurse Practitioner (Internal Medicine)  Extended Emergency Contact Information Primary Emergency Contact: McClanahan,Susan Address: [address] [place], [place] [address] [place] of [place] Home Phone: [phone] Mobile Phone: [phone] Relation: Daughter Secondary Emergency Contact: Gardiner Sleeper [place] of [place] Home Phone: [phone] Relation: Daughter  Code Status:  DNR Goals of care: Advanced Directive information Advanced Directives 08/06/2018  Does Patient Have a Medical Advance Directive? Yes  Type of Advance Directive Out of facility DNR (pink MOST or yellow form)  Does patient want to make changes to medical advance directive? No - Patient declined  Copy of Healthcare Power of Attorney in Chart? -  Would patient like information on creating a medical advance directive? -  Pre-existing out of facility DNR order (yellow form or pink MOST form) Yellow form placed in chart (order not valid for inpatient use);Pink MOST form placed in chart (order not valid for inpatient use)     Chief Complaint  Patient presents with  . Acute Visit    C/o- anxiety and agitation   HPI: The patient is 84yrs old female, resides in [facility] for safety and care assistance. She was observed by staff and her daughter having agitation and anxiety yesterday 08/05/18 after her dental and hair appointment. She is in her usual state of health today, is ambulating with walker, going to dinning room for meals, sitting in living area and socializing with others. She has severe  HOH, likes to do things on her own ideas and pace, easily gets irritated and overwhelmed.  Her mood is stable on Sertraline 125mg , weight has gained about #5Ibs in the past 3 months. Polymyalgia Rheumatica is managed with Prednisone 3mg  qd. Blood pressure is controlled on Atenolol 50mg  qd.  Past Medical History:  Diagnosis Date  . Acute bronchitis 06/27/2015  . Alzheimer's dementia (HCC)   . Cataract   . Cellulitis of left leg   . Depression   . Eczema   . High cholesterol   . History of renal stone    excision  . Hypertension   . Mild cognitive impairment   . Osteoarthritis   . Polymyalgia rheumatica (HCC)    rx with prednisone  . Urinary incontinence   . Varicose veins   . Xerosis of skin    Past Surgical History:  Procedure Laterality Date  . cataract surgery    . EYE SURGERY    . JOINT REPLACEMENT Bilateral    Dr. Trudee Grip  . kidney stone surgey    . lipoma removal      Allergies  Allergen Reactions  . Donepezil Other (See Comments)    BAD DREAMS  . Namenda [Memantine]   . Remeron [Mirtazapine] Other (See Comments)    Dizzy     Outpatient Encounter Medications as of 08/06/2018  Medication Sig  . acetaminophen (TYLENOL) 500 MG tablet Take 2 tablets (1,000 mg total) by mouth every 8 (eight) hours as needed.  Marland Kitchen atenolol (TENORMIN) 50 MG tablet Take 1 tablet (50 mg total) by mouth daily.  . calcium carbonate (OSCAL)  1500 (600 Ca) MG TABS tablet Take 600 mg of elemental calcium by mouth 2 (two) times daily with a meal.  . carboxymethylcellulose (REFRESH PLUS) 0.5 % SOLN Place 1 drop into both eyes 4 (four) times daily.   . cholecalciferol (VITAMIN D) 1000 units tablet Take 1,000 Units by mouth daily.  . Eyelid Cleansers (AVENOVA EX) Apply 1 application topically 2 (two) times daily.  . hydrocortisone cream 1 % Apply 1 application topically as needed. Apply to the chest   . Multiple Vitamin (MULTIVITAMIN WITH MINERALS) TABS tablet Take 1 tablet by mouth daily.  .  Omega-3 Fatty Acids (FISH OIL) 1200 MG CAPS Take 1 capsule by mouth daily.   . predniSONE (DELTASONE) 1 MG tablet Take 3 mg by mouth daily.   . sertraline (ZOLOFT) 100 MG tablet TAKE 1 TABLET BY MOUTH EVERY DAY (Patient taking differently: 100 mg+25 mg = 125 mg)  . sertraline (ZOLOFT) 25 MG tablet Take 25 mg by mouth daily. 100 mg +25 mg= 125 mg  . UNABLE TO FIND Med Name: 120 SAL AC 30% in Petrolatum cream. Apply to right and left cheek dark spots at bedtime.  Marland Kitchen. azithromycin (AZASITE) 1 % ophthalmic solution Place 1 drop into both eyes at bedtime.   No facility-administered encounter medications on file as of 08/06/2018.    ROS was provided with assistance of staff and the patient's daughter. Review of Systems  Constitutional: Positive for unexpected weight change. Negative for activity change, appetite change, chills, diaphoresis, fatigue and fever.       #5Ibs in 3 months since Sertraline was increased to 125mg  qd.   HENT: Positive for hearing loss. Negative for congestion and voice change.   Respiratory: Negative for cough, shortness of breath and wheezing.   Cardiovascular: Negative for chest pain, palpitations and leg swelling.  Gastrointestinal: Negative for abdominal distention, abdominal pain, constipation, diarrhea, nausea and vomiting.  Genitourinary: Negative for difficulty urinating, dysuria and urgency.  Musculoskeletal: Positive for arthralgias and gait problem.       Left knee pain  Skin: Positive for color change. Negative for pallor.       Chronic brownish pigmented BLE  Neurological: Negative for dizziness, speech difficulty, weakness and headaches.       Dementia  Psychiatric/Behavioral: Positive for agitation, behavioral problems and confusion. Negative for hallucinations and sleep disturbance. The patient is nervous/anxious.     Immunization History  Administered Date(s) Administered  . Influenza Whole 05/28/2012, 06/01/2018  . Influenza, High Dose Seasonal PF  06/08/2016  . Influenza-Unspecified 05/28/2014, 08/28/2014, 05/27/2015, 06/18/2017  . Pneumococcal Conjugate-13 01/21/2014  . Pneumococcal Polysaccharide-23 04/16/2015  . Td 09/29/2003  . Zoster 01/14/2011   Pertinent  Health Maintenance Due  Topic Date Due  . INFLUENZA VACCINE  Completed  . DEXA SCAN  Completed  . PNA vac Low Risk Adult  Completed   Fall Risk  05/17/2018 05/10/2017 05/01/2017 07/17/2016 06/09/2015  Falls in the past year? No No No Yes No  Comment - - - tripped over the last stair in the house -  Number falls in past yr: - - - 1 -   Functional Status Survey:    Vitals:   08/06/18 1149  BP: 132/64  Pulse: 64  Resp: 20  Temp: 97.7 F (36.5 C)  SpO2: 98%  Weight: 180 lb (81.6 kg)  Height: 5\' 4"  (1.626 m)   Body mass index is 30.9 kg/m. Physical Exam  Constitutional: She appears well-developed and well-nourished.  HENT:  Head: Normocephalic and  atraumatic.  Eyes: Pupils are equal, round, and reactive to light. EOM are normal.  Neck: Normal range of motion. Neck supple. No JVD present. No thyromegaly present.  Cardiovascular: Normal rate and regular rhythm.  No murmur heard. Pulmonary/Chest: Effort normal. She has no wheezes. She has no rales.  Abdominal: Soft. She exhibits no distension. There is no tenderness. There is no rebound and no guarding.  Musculoskeletal: She exhibits no edema.  Chronic knee pain R>L, ambulates with walker.   Neurological: She is alert. No cranial nerve deficit. She exhibits normal muscle tone. Coordination normal.  Oriented to person and place.   Skin: Skin is warm and dry.  Venous stasis changes BLE in brownish color  Psychiatric: She has a normal mood and affect. Her behavior is normal.    Labs reviewed: Recent Labs    01/18/18 01/24/18 1227 04/09/18  NA 139 140 139  K 3.9 4.9 3.9  CL 102 102 106  CO2 26 30 28   GLUCOSE  --  146*  --   BUN 18 20 18   CREATININE 0.7 0.67 0.6  CALCIUM 10.0 9.8 9.2   Recent Labs     01/18/18 01/24/18 1227 04/09/18  AST 22 42* 19  ALT 19 32 16  ALKPHOS 75 73 67  BILITOT  --  1.3*  --   PROT  --  7.5 5.6  ALBUMIN 4.4 4.5 3.7   Recent Labs    01/18/18 01/24/18 1227 04/09/18  WBC 9.4 10.6* 7.7  NEUTROABS  --  8.4*  --   HGB 15.8 16.4* 13.7  HCT 47* 48.3* 41  MCV  --  87.0  --   PLT 236 142* 218   Lab Results  Component Value Date   TSH 2.77 04/09/2018   Lab Results  Component Value Date   HGBA1C 5.3 04/26/2017   Lab Results  Component Value Date   CHOL 217 (A) 04/09/2018   HDL 51 04/09/2018   LDLCALC 147 04/09/2018   LDLDIRECT 173.0 08/07/2012   TRIG 86 04/09/2018   CHOLHDL 5 03/20/2016    Significant Diagnostic Results in last 30 days:  No results found.  Assessment/Plan Essential hypertension, benign Blood pressure is controlled, continue Atenolol 50mg  qd.   Late onset Alzheimer's disease without behavioral disturbance Continue [facility] for safety and care assistance. Gentle re-directing the patient when she is agitated or irritated.   Osteoarthritis Multiple sites, complained left knee pain with walking today. Prn Tylenol is available to her.   Depression with anxiety Mood is stable, continue Sertraline 125mg  qd.   Polymyalgia rheumatica (HCC) Stable, continue Prednisone 3mg  qd.      Family/ staff Communication: plan of care reviewed with the patient, the patient's daughter, and charge nurse.   Labs/tests ordered:  none  Time spend 25 minutes.

## 2018-08-06 NOTE — Assessment & Plan Note (Signed)
Multiple sites, complained left knee pain with walking today. Prn Tylenol is available to her.

## 2018-08-06 NOTE — Assessment & Plan Note (Signed)
Stable, continue Prednisone 3mg qd.  

## 2018-08-06 NOTE — Assessment & Plan Note (Addendum)
Continue [facility] for safety and care assistance. Gentle re-directing the patient when she is agitated or irritated.

## 2018-08-06 NOTE — Assessment & Plan Note (Signed)
Mood is stable, continue Sertraline 125mg  qd.

## 2018-08-19 ENCOUNTER — Non-Acute Institutional Stay: Payer: Medicare Other | Admitting: Family Medicine

## 2018-08-19 ENCOUNTER — Encounter: Payer: Self-pay | Admitting: Family Medicine

## 2018-08-19 DIAGNOSIS — F0280 Dementia in other diseases classified elsewhere without behavioral disturbance: Secondary | ICD-10-CM

## 2018-08-19 DIAGNOSIS — G301 Alzheimer's disease with late onset: Secondary | ICD-10-CM

## 2018-08-19 DIAGNOSIS — I10 Essential (primary) hypertension: Secondary | ICD-10-CM | POA: Diagnosis not present

## 2018-08-19 DIAGNOSIS — I872 Venous insufficiency (chronic) (peripheral): Secondary | ICD-10-CM

## 2018-08-19 DIAGNOSIS — M353 Polymyalgia rheumatica: Secondary | ICD-10-CM

## 2018-08-19 NOTE — Progress Notes (Signed)
Provider:  Jacalyn LefevreStephen Bernece Gall, MD Location:  [facility] Room Number: [phone] Place of Service:  ALF ([phone])  PCP: Mast, Man X, NP Patient Care Team: Mast, Man X, NP as PCP - General (Internal Medicine) Donnetta HailBeekman, James F, MD (Rheumatology) Ernesto RutherfordGroat, Robert, MD (Ophthalmology) Nita SellsHall, John, MD (Dermatology) Crist FatHerrick, Benjamin W, MD as Attending Physician (Urology) Mast, Man X, NP as Nurse Practitioner (Internal Medicine)  Extended Emergency Contact Information Primary Emergency Contact: McClanahan,Susan Address: [address] [place], [place] [address] [place] Home Phone: [phone] Mobile Phone: [phone] Relation: Daughter Secondary Emergency Contact: Gardiner SleeperBaker,Carolanne  [place] Home Phone: [phone] Relation: Daughter  Code Status: DNR Goals of Care: Advanced Directive information Advanced Directives 08/06/2018  Does Patient Have a Medical Advance Directive? Yes  Type of Advance Directive Out of facility DNR (pink MOST or yellow form)  Does patient want to make changes to medical advance directive? No - Patient declined  Copy of Healthcare Power of Attorney in Chart? -  Would patient like information on creating a medical advance directive? -  Pre-existing out of facility DNR order (yellow form or pink MOST form) Yellow form placed in chart (order not valid for inpatient use);Pink MOST form placed in chart (order not valid for inpatient use)      Chief Complaint  Patient presents with  . Medical Management of Chronic Issues    HPI: Patient is a [age] y.o. female seen today for medical management of chronic problems including: Alzheimer's dementia, hypertension, polymyalgia rheumatica.  I found her in her room in assisted living today.  She seemed confused but I came to visit her but was able to carry on conversation.  She remembers things from the past such as having sailed with her husband but cannot recall  the name or type of boat that sailed in.  She is on no memory preserving medicines.  She does take prednisone 3 mg/day for polymyalgia rheumatica.  Appetite is said to be good.  She tells me she sleeps well but has urinary incontinence during the night.  Past Medical History:  Diagnosis Date  . Acute bronchitis 06/27/2015  . Alzheimer's dementia (HCC)   . Cataract   . Cellulitis of left leg   . Depression   . Eczema   . High cholesterol   . History of renal stone    excision  . Hypertension   . Mild cognitive impairment   . Osteoarthritis   . Polymyalgia rheumatica (HCC)    rx with prednisone  . Urinary incontinence   . Varicose veins   . Xerosis of skin    Past Surgical History:  Procedure Laterality Date  . cataract surgery    . EYE SURGERY    . JOINT REPLACEMENT Bilateral    Dr. Trudee GripFrank Alusio  . kidney stone surgey    . lipoma removal      reports that she quit smoking about 72 years ago. She started smoking about 74 years ago. She quit smokeless tobacco use about 72 years ago. She reports that she does not drink alcohol or use drugs. Social History   Socioeconomic History  . Marital status: Widowed    Spouse name: Not on file  . Number of children: 3  . Years of education: 1 yr college  . Highest education level: Not on file  Occupational History  . Occupation: Retired Data processing managerteacher  Social Needs  . Physicist, medicalinancial resource  strain: Not hard at all  . Food insecurity:    Worry: Never true    Inability: Never true  . Transportation needs:    Medical: No    Non-medical: No  Tobacco Use  . Smoking status: Former Smoker    Start date: 08/07/1944    Last attempt to quit: 10/13/1945    Years since quitting: 72.8  . Smokeless tobacco: Former Neurosurgeon    Quit date: 10/01/1945  Substance and Sexual Activity  . Alcohol use: No    Alcohol/week: 6.0 - 7.0 standard drinks    Types: 6 - 7 Standard drinks or equivalent per week  . Drug use: No  . Sexual activity: Not on file  Lifestyle   . Physical activity:    Days per week: 0 days    Minutes per session: 0 min  . Stress: Not at all  Relationships  . Social connections:    Talks on phone: More than three times a week    Gets together: More than three times a week    Attends religious service: Never    Active member of club or organization: No    Attends meetings of clubs or organizations: Never    Relationship status: Widowed  . Intimate partner violence:    Fear of current or ex partner: No    Emotionally abused: No    Physically abused: No    Forced sexual activity: No  Other Topics Concern  . Not on file  Social History Narrative   Widowed remote hx of tobacco x 4 years   Living at [facility], [place]   G4P3   Daughters have HCPOA  If needed      Diet: Regular, Healthy Foods      Do you drink/ eat things with caffeine? Coffee in a.m. Approx. 2 cups per day.      Right-handed      Marital status: Married/Widowed                             What year were you married ? Twice, 1st 1948, 2nd 1992      Do you live in a house, apartment,assistred living, condo, trailer, etc.)? Apartment      Is it one or more stories? No      How many persons live in your home ? 1      Do you have any pets in your home ?(please list) No      Current or past profession: Research officer, trade union       Do you exercise? Yes                             Type & how often: Walk daily around facility.      Do you have a living will? Yes      Do you have a DNR form? Yes                      If not, do you want to discuss one? Yes      Do you have signed POA?HPOA forms?  Yes               If so, please bring to your        appointment          Functional Status Survey:    Family History  Problem  Relation Age of Onset  . Melanoma Mother        [id number]  . Breast cancer Mother        [id number]  . Stroke Father        3690  . Arthritis Father        6275  . Diabetes Father 8578       borderline diabetic  . Memory loss Sister    . Osteoporosis Other     Health Maintenance  Topic Date Due  . TETANUS/TDAP  04/17/2021  . INFLUENZA VACCINE  Completed  . DEXA SCAN  Completed  . PNA vac Low Risk Adult  Completed    Allergies  Allergen Reactions  . Donepezil Other (See Comments)    BAD DREAMS  . Namenda [Memantine]   . Remeron [Mirtazapine] Other (See Comments)    Dizzy     Outpatient Encounter Medications as of 08/19/2018  Medication Sig  . acetaminophen (TYLENOL) 500 MG tablet Take 2 tablets (1,000 mg total) by mouth every 8 (eight) hours as needed.  Marland Kitchen. atenolol (TENORMIN) 50 MG tablet Take 1 tablet (50 mg total) by mouth daily.  . calcium carbonate (OSCAL) 1500 (600 Ca) MG TABS tablet Take 600 mg of elemental calcium by mouth 2 (two) times daily with a meal.  . carboxymethylcellulose (REFRESH PLUS) 0.5 % SOLN Place 1 drop into both eyes 4 (four) times daily.   . cholecalciferol (VITAMIN D) 1000 units tablet Take 1,000 Units by mouth daily.  . Eyelid Cleansers (AVENOVA EX) Apply 1 application topically 2 (two) times daily.  . hydrocortisone cream 1 % Apply 1 application topically as needed. Apply to the chest   . Multiple Vitamin (MULTIVITAMIN WITH MINERALS) TABS tablet Take 1 tablet by mouth daily.  . Omega-3 Fatty Acids (FISH OIL) 1200 MG CAPS Take 1 capsule by mouth daily.   . predniSONE (DELTASONE) 1 MG tablet Take 3 mg by mouth daily.   . sertraline (ZOLOFT) 100 MG tablet TAKE 1 TABLET BY MOUTH EVERY DAY (Patient taking differently: 100 mg+25 mg = 125 mg)  . sertraline (ZOLOFT) 25 MG tablet Take 25 mg by mouth daily. 100 mg +25 mg= 125 mg  . UNABLE TO FIND Med Name: 120 SAL AC 30% in Petrolatum cream. Apply to right and left cheek dark spots at bedtime.  Marland Kitchen. azithromycin (AZASITE) 1 % ophthalmic solution Place 1 drop into both eyes at bedtime.   No facility-administered encounter medications on file as of 08/19/2018.     Review of Systems  Constitutional: Negative.   HENT: Negative.   Respiratory:  Negative.   Cardiovascular: Negative.   Gastrointestinal: Negative.   Musculoskeletal: Positive for arthralgias and gait problem.  Psychiatric/Behavioral: Positive for confusion.    Vitals:   08/19/18 1508  BP: 132/76  Pulse: 64  Resp: 20  Temp: 97.6 F (36.4 C)  SpO2: 98%  Weight: 180 lb (81.6 kg)  Height: 5\' 4"  (1.626 m)   Body mass index is 30.9 kg/m. Physical Exam Constitutional:      Appearance: Normal appearance.  HENT:     Head: Normocephalic.     Nose: Nose normal.  Neck:     Musculoskeletal: Normal range of motion.  Cardiovascular:     Rate and Rhythm: Normal rate and regular rhythm.  Pulmonary:     Effort: Pulmonary effort is normal.     Breath sounds: Normal breath sounds.  Musculoskeletal: Normal range of motion.     Comments: She is able to walk  independently around the room but uses walker outside her room  Skin:    Findings: Erythema present.  Neurological:     General: No focal deficit present.     Mental Status: She is alert.     Comments: Oriented to person and place     Labs reviewed: Basic Metabolic Panel: Recent Labs    01/18/18 01/24/18 1227 04/09/18  NA 139 140 139  K 3.9 4.9 3.9  CL 102 102 106  CO2 26 30 28   GLUCOSE  --  146*  --   BUN 18 20 18   CREATININE 0.7 0.67 0.6  CALCIUM 10.0 9.8 9.2   Liver Function Tests: Recent Labs    01/18/18 01/24/18 1227 04/09/18  AST 22 42* 19  ALT 19 32 16  ALKPHOS 75 73 67  BILITOT  --  1.3*  --   PROT  --  7.5 5.6  ALBUMIN 4.4 4.5 3.7   No results for input(s): LIPASE, AMYLASE in the last 8760 hours. No results for input(s): AMMONIA in the last 8760 hours. CBC: Recent Labs    01/18/18 01/24/18 1227 04/09/18  WBC 9.4 10.6* 7.7  NEUTROABS  --  8.4*  --   HGB 15.8 16.4* 13.7  HCT 47* 48.3* 41  MCV  --  87.0  --   PLT 236 142* 218   Cardiac Enzymes: No results for input(s): CKTOTAL, CKMB, CKMBINDEX, TROPONINI in the last 8760 hours. BNP: Invalid input(s): POCBNP Lab Results    Component Value Date   HGBA1C 5.3 04/26/2017   Lab Results  Component Value Date   TSH 2.77 04/09/2018   Lab Results  Component Value Date   VITAMINB12 827 03/20/2016   Lab Results  Component Value Date   FOLATE >20.0 ng/mL 03/08/2010   Lab Results  Component Value Date   FERRITIN 166.2 10/08/2006    Imaging and Procedures obtained prior to SNF admission: Ct Head Wo Contrast  Result Date: 01/24/2018 CLINICAL DATA:  Headache 1 week.  Dementia. EXAM: CT HEAD WITHOUT CONTRAST TECHNIQUE: Contiguous axial images were obtained from the base of the skull through the vertex without intravenous contrast. COMPARISON:  MRI head 01/07/2009 FINDINGS: Brain: Cerebral atrophy most prominent in the frontal and temporal lobes. Mild progression since 2010. Negative for hydrocephalus. Negative for acute infarct.  Negative for hemorrhage or mass. 7.7 mm cyst in the posterior sella compatible with pituitary cyst. This was present previously but has enlarged. Vascular: Negative for hyperdense vessel Skull: No skull lesion. Sinuses/Orbits: Extensive opacification of the sphenoid sinus with mild bony thickening. Question chronic sinusitis versus prior trans-sphenoidal pituitary surgery. Review of the prior MRI demonstrates T1 hyperintensity within the sphenoid sinus which could be due to fat from prior surgery versus hyperintense secretions. Other: None IMPRESSION: Frontotemporal atrophy.  No acute abnormality. 7.7 mm cyst posterior sella likely a benign pituitary cyst or Rathke's cleft cyst. This has progressed in size since 2010 Extensive mucosal disease in the sphenoid sinus with bony thickening likely related to chronic sinusitis. Question history of prior trans-sphenoidal pituitary surgery. Electronically Signed   By: Marlan Palau M.D.   On: 01/24/2018 13:11    Assessment/Plan 1. Essential hypertension, benign Blood pressure today is 132/76 and 132/64 2 weeks ago.  She is  on blood pressure medication,  atenolol 50 mg daily  2. Chronic venous insufficiency Patient has some edema in her lower legs but not on diuretic.  3. Late onset Alzheimer's disease without behavioral disturbance (HCC) Able to converse but  is confused and has difficult time remembering past events  4. Polymyalgia rheumatica (HCC) Symptoms are controlled with low-dose prednisone, 3 mg.   Family/ staff Communication: Findings communicated to nursing staff  Labs/tests ordered:  Bertram Millard. Hyacinth Meeker, MD [facility] [address] [place], [place] [address] Office [phone]

## 2018-11-21 ENCOUNTER — Non-Acute Institutional Stay: Payer: Medicare Other | Admitting: Nurse Practitioner

## 2018-11-21 ENCOUNTER — Encounter: Payer: Self-pay | Admitting: Nurse Practitioner

## 2018-11-21 DIAGNOSIS — I10 Essential (primary) hypertension: Secondary | ICD-10-CM | POA: Diagnosis not present

## 2018-11-21 DIAGNOSIS — W19XXXA Unspecified fall, initial encounter: Secondary | ICD-10-CM

## 2018-11-21 DIAGNOSIS — F418 Other specified anxiety disorders: Secondary | ICD-10-CM | POA: Diagnosis not present

## 2018-11-21 DIAGNOSIS — M353 Polymyalgia rheumatica: Secondary | ICD-10-CM | POA: Diagnosis not present

## 2018-11-21 DIAGNOSIS — F0280 Dementia in other diseases classified elsewhere without behavioral disturbance: Secondary | ICD-10-CM

## 2018-11-21 DIAGNOSIS — G301 Alzheimer's disease with late onset: Secondary | ICD-10-CM

## 2018-11-21 DIAGNOSIS — S61412A Laceration without foreign body of left hand, initial encounter: Secondary | ICD-10-CM

## 2018-11-21 DIAGNOSIS — N390 Urinary tract infection, site not specified: Secondary | ICD-10-CM

## 2018-11-21 DIAGNOSIS — R32 Unspecified urinary incontinence: Secondary | ICD-10-CM

## 2018-11-21 DIAGNOSIS — M6281 Muscle weakness (generalized): Secondary | ICD-10-CM

## 2018-11-21 DIAGNOSIS — R300 Dysuria: Secondary | ICD-10-CM | POA: Insufficient documentation

## 2018-11-21 LAB — BASIC METABOLIC PANEL
BUN: 27 — AB (ref 4–21)
Creatinine: 0.8 (ref 0.5–1.1)
Glucose: 122
Potassium: 4.4 (ref 3.4–5.3)
Sodium: 137 (ref 137–147)

## 2018-11-21 LAB — CBC AND DIFFERENTIAL
HCT: 45 (ref 36–46)
Hemoglobin: 15.4 (ref 12.0–16.0)
Platelets: 299 (ref 150–399)
WBC: 11.9

## 2018-11-21 LAB — HEPATIC FUNCTION PANEL
ALT: 23 (ref 7–35)
AST: 30 (ref 13–35)
Alkaline Phosphatase: 74 (ref 25–125)
Bilirubin, Total: 0.7

## 2018-11-21 NOTE — Assessment & Plan Note (Signed)
No focal weakness, will update CBC/diff, CMP/eGFR, UA C/S

## 2018-11-21 NOTE — Assessment & Plan Note (Signed)
Left hand major thumb pad skin tear, well approximated with steri strips, no s/s of infection. It will heal.

## 2018-11-21 NOTE — Assessment & Plan Note (Signed)
Continue [facility] for safety and care assistance. 

## 2018-11-21 NOTE — Assessment & Plan Note (Signed)
Blood pressure is controlled on Atenolol 50mg  qd.

## 2018-11-21 NOTE — Assessment & Plan Note (Signed)
Worsened, will obtain UA C/S to r/o UTI

## 2018-11-21 NOTE — Assessment & Plan Note (Signed)
Uncertain of onset and duration due to her dementia, HPI was provided with assistance staff. Obtain UA C/S, CBC/diff  to r/o UTI

## 2018-11-21 NOTE — Progress Notes (Addendum)
Location:  [facility] Room Number: [phone] Place of Service:  ALF [phone]) Provider:  Marlana Latus  NP  Jalan Bodi X, NP  Patient Care Team: Theoplis Garciagarcia X, NP as PCP - General (Internal Medicine) Hennie Duos, MD (Rheumatology) Clent Jacks, MD (Ophthalmology) Allyn Kenner, MD (Dermatology) Ardis Hughs, MD as Attending Physician (Urology) Mora Pedraza X, NP as Nurse Practitioner (Internal Medicine)  Extended Emergency Contact Information Primary Emergency Contact: McClanahan,Susan Address: [place], [place] [address] Johnnette [place] of [place] Phone: [phone] Mobile Phone: [phone] Relation: Daughter Secondary Emergency Contact: Newman Nip [place] of [place] Phone: [phone] Relation: Daughter  Code Status:  DNR Goals of care: Advanced Directive information Advanced Directives 11/21/2018  Does Patient Have a Medical Advance Directive? Yes  Type of Advance Directive Out of facility DNR (pink MOST or yellow form)  Does patient want to make changes to medical advance directive? No - Patient declined  Copy of [place] in Chart? -  Would patient like information on creating a medical advance directive? -  Pre-existing out of facility DNR order (yellow form or pink MOST form) Yellow form placed in chart (order not valid for inpatient use);Pink MOST form placed in chart (order not valid for inpatient use)     Chief Complaint  Patient presents with  . Medical Management of Chronic Issues    HPI:  Pt is a 85 y.o. female seen today for medical management of chronic diseases.     The patient resides in [facility] for safety and care assistance, her mood is managed on Sertraline 136m qd. Polymyalgia rheumatica is ccontrlled on Prednisone 371mqd. HTN, blood pressure is controlled on Atenolol 5041md.   FelGolden Circleday  when she was found on floor in her room, sustained a skin tear left  hand. She was reported to have increased confusion, refusing assistance, angry with cursing/yelling when she was assessed. HPI was provided with assistance of staff: dysuria, incontinent of urine, she denied abd pain, urinary urgency, nausea, vomiting. She is afebrile.   Past Medical History:  Diagnosis Date  . Acute bronchitis 06/27/2015  . Alzheimer's dementia (HCCBaird . Cataract   . Cellulitis of left leg   . Depression   . Eczema   . High cholesterol   . History of renal stone    excision  . Hypertension   . Mild cognitive impairment   . Osteoarthritis   . Polymyalgia rheumatica (HCC)    rx with prednisone  . Urinary incontinence   . Varicose veins   . Xerosis of skin    Past Surgical History:  Procedure Laterality Date  . cataract surgery    . EYE SURGERY    . JOINT REPLACEMENT Bilateral    Dr. FraHector Shade kidney stone surgey    . lipoma removal      Allergies  Allergen Reactions  . Donepezil Other (See Comments)    BAD DREAMS  . Namenda [Memantine]   . Remeron [Mirtazapine] Other (See Comments)    Dizzy     Outpatient Encounter Medications as of 11/21/2018  Medication Sig  . acetaminophen (TYLENOL) 500 MG tablet Take 2 tablets (1,000 mg total) by mouth every 8 (eight) hours as needed.  . aMarland Kitchenenolol (TENORMIN) 50 MG tablet Take 1 tablet (50 mg total) by mouth daily.  . calcium carbonate (OSCAL) 1500 (600 Ca) MG  TABS tablet Take 600 mg of elemental calcium by mouth 2 (two) times daily with a meal.  . carboxymethylcellulose (REFRESH PLUS) 0.5 % SOLN Place 1 drop into both eyes 4 (four) times daily.   . cholecalciferol (VITAMIN D) 1000 units tablet Take 1,000 Units by mouth daily.  . Eyelid Cleansers (AVENOVA EX) Apply 1 application topically 2 (two) times daily.  . hydrocortisone cream 1 % Apply 1 application topically as needed. Apply to the chest   . Multiple Vitamin (MULTIVITAMIN WITH MINERALS) TABS tablet Take 1 tablet by mouth daily.  . Omega-3 Fatty Acids  (FISH OIL) 1200 MG CAPS Take 1 capsule by mouth daily.   . predniSONE (DELTASONE) 1 MG tablet Take 3 mg by mouth daily.   . sertraline (ZOLOFT) 100 MG tablet Take 100 mg by mouth daily. Take with 25 mg = 125 mg.  . sertraline (ZOLOFT) 25 MG tablet Take 25 mg by mouth daily. 100 mg +25 mg= 125 mg  . UNABLE TO FIND Med Name: 120 SAL AC 30% in Petrolatum cream. Apply to right and left cheek dark spots at bedtime.  . [DISCONTINUED] azithromycin (AZASITE) 1 % ophthalmic solution Place 1 drop into both eyes at bedtime.  . [DISCONTINUED] sertraline (ZOLOFT) 100 MG tablet TAKE 1 TABLET BY MOUTH EVERY DAY (Patient taking differently: 100 mg+25 mg = 125 mg)   No facility-administered encounter medications on file as of 11/21/2018.    ROS was provided with assistance of staff Review of Systems  Constitutional: Positive for fatigue. Negative for activity change, appetite change, chills, diaphoresis, fever and unexpected weight change.  HENT: Positive for hearing loss. Negative for congestion and voice change.   Respiratory: Negative for cough, shortness of breath and wheezing.   Cardiovascular: Negative for chest pain and leg swelling.  Gastrointestinal: Negative for abdominal distention, abdominal pain, constipation, diarrhea, nausea and vomiting.  Genitourinary: Positive for dysuria. Negative for difficulty urinating and urgency.       Incontinent of urine.   Musculoskeletal: Positive for arthralgias, back pain and gait problem.  Skin: Positive for color change.       Pigmented skin changes BLE. Skin tear left hand.   Neurological: Negative for dizziness, facial asymmetry, speech difficulty, weakness and headaches.       Dementia  Psychiatric/Behavioral: Negative for agitation, behavioral problems, hallucinations and sleep disturbance. The patient is not nervous/anxious.     Immunization History  Administered Date(s) Administered  . Influenza Whole 05/28/2012, 06/01/2018  . Influenza, High Dose  Seasonal PF 06/08/2016  . Influenza-Unspecified 05/28/2014, 08/28/2014, 05/27/2015, 06/18/2017  . Pneumococcal Conjugate-13 01/21/2014  . Pneumococcal Polysaccharide-23 04/16/2015  . Td 09/29/2003  . Zoster 01/14/2011   Pertinent  Health Maintenance Due  Topic Date Due  . INFLUENZA VACCINE  Completed  . DEXA SCAN  Completed  . PNA vac Low Risk Adult  Completed   Fall Risk  05/17/2018 05/10/2017 05/01/2017 07/17/2016 06/09/2015  Falls in the past year? No No No Yes No  Comment - - - tripped over the last stair in the house -  Number falls in past yr: - - - 1 -   Functional Status Survey:    Vitals:   11/21/18 0829  BP: 140/74  Pulse: 60  Resp: 20  Temp: 97.7 F (36.5 C)  SpO2: 94%  Weight: 182 lb (82.6 kg)  Height: 5' 4"  (1.626 m)   Body mass index is 31.24 kg/m. Physical Exam Vitals signs and nursing note reviewed.  Constitutional:  General: She is not in acute distress.    Appearance: Normal appearance. She is obese. She is not ill-appearing, toxic-appearing or diaphoretic.  HENT:     Head: Normocephalic and atraumatic.     Nose: Nose normal.     Mouth/Throat:     Mouth: Mucous membranes are moist.  Eyes:     Extraocular Movements: Extraocular movements intact.     Pupils: Pupils are equal, round, and reactive to light.  Neck:     Musculoskeletal: Normal range of motion and neck supple.  Cardiovascular:     Rate and Rhythm: Normal rate and regular rhythm.     Heart sounds: No murmur.  Pulmonary:     Effort: Pulmonary effort is normal.     Breath sounds: No wheezing, rhonchi or rales.  Abdominal:     Palpations: Abdomen is soft.     Tenderness: There is no abdominal tenderness. There is no guarding or rebound.  Musculoskeletal:     Right lower leg: No edema.     Left lower leg: No edema.  Skin:    General: Skin is warm and dry.     Comments: Brownish pigmented BLE. Skin tear left major thumb pad, well approximated with steri strips.  Neurological:      General: No focal deficit present.     Mental Status: She is alert. Mental status is at baseline.     Cranial Nerves: No cranial nerve deficit.     Motor: No weakness.     Coordination: Coordination normal.     Gait: Gait abnormal.     Comments: Oriented to person and place.   Psychiatric:        Mood and Affect: Mood normal.        Behavior: Behavior normal.     Comments: Increased confusion, restlessness, wandering.      Labs reviewed: Recent Labs    01/18/18 01/24/18 1227 04/09/18  NA 139 140 139  K 3.9 4.9 3.9  CL 102 102 106  CO2 26 30 28   GLUCOSE  --  146*  --   BUN 18 20 18   CREATININE 0.7 0.67 0.6  CALCIUM 10.0 9.8 9.2   Recent Labs    01/18/18 01/24/18 1227 04/09/18  AST 22 42* 19  ALT 19 32 16  ALKPHOS 75 73 67  BILITOT  --  1.3*  --   PROT  --  7.5 5.6  ALBUMIN 4.4 4.5 3.7   Recent Labs    01/18/18 01/24/18 1227 04/09/18  WBC 9.4 10.6* 7.7  NEUTROABS  --  8.4*  --   HGB 15.8 16.4* 13.7  HCT 47* 48.3* 41  MCV  --  87.0  --   PLT 236 142* 218   Lab Results  Component Value Date   TSH 2.77 04/09/2018   Lab Results  Component Value Date   HGBA1C 5.3 04/26/2017   Lab Results  Component Value Date   CHOL 217 (A) 04/09/2018   HDL 51 04/09/2018   LDLCALC 147 04/09/2018   LDLDIRECT 173.0 08/07/2012   TRIG 86 04/09/2018   CHOLHDL 5 03/20/2016    Significant Diagnostic Results in last 30 days:  No results found.  Assessment/Plan Essential hypertension, benign Blood pressure is controlled on Atenolol 60m qd.   Late onset Alzheimer's disease without behavioral disturbance Continue [facility] for safety and care assistance.  Depression with anxiety Her mood is stable, continue Sertraline 1248mqd.   Polymyalgia rheumatica (HCC) Stable, continue Prednisone 40m47md.  Skin tear of left hand without complication Left hand major thumb pad skin tear, well approximated with steri strips, no s/s of infection. It will heal.   Fall Sustained a skin  tear left hand, no s/s of infection, it should heal. Increased frailty, lack of safety awareness are contributory. Close supervision for safety.   Generalized muscle weakness No focal weakness, will update CBC/diff, CMP/eGFR, UA C/S  Dysuria Uncertain of onset and duration due to her dementia, HPI was provided with assistance staff. Obtain UA C/S, CBC/diff  to r/o UTI  Incontinent of urine Worsened, will obtain UA C/S to r/o UTI  UTI (urinary tract infection) 11/21/18 wbc 11.9, Hgb 15.4, plt 299, neutrophils 70.8%, Na 137, K 4.4, Bun 27, creat 0.81, UA pending.  11/22/18 UA positive nitrite, 2+ leukocyte esterase, 10-20 wbc, many bacteria. 11/22/18 empirical Nitrofurantoin 140m bid x 7 days.       Family/ staff Communication: plan of care reviewed with the patient and charge nurse.   Labs/tests ordered: CBC/diff, CMP/eGFR, UA C/S  Time spend 25 minutes.

## 2018-11-21 NOTE — Assessment & Plan Note (Signed)
Her mood is stable, continue Sertraline 125mg qd 

## 2018-11-21 NOTE — Assessment & Plan Note (Signed)
Sustained a skin tear left hand, no s/s of infection, it should heal. Increased frailty, lack of safety awareness are contributory. Close supervision for safety.

## 2018-11-21 NOTE — Assessment & Plan Note (Signed)
Stable, continue Prednisone 3mg qd.  

## 2018-11-22 NOTE — Assessment & Plan Note (Signed)
11/21/18 wbc 11.9, Hgb 15.4, plt 299, neutrophils 70.8%, Na 137, K 4.4, Bun 27, creat 0.81, UA pending.  11/22/18 UA positive nitrite, 2+ leukocyte esterase, 10-20 wbc, many bacteria. 11/22/18 empirical Nitrofurantoin 100mg  bid x 7 days.

## 2018-12-09 ENCOUNTER — Non-Acute Institutional Stay: Payer: Medicare Other | Admitting: Nurse Practitioner

## 2018-12-09 ENCOUNTER — Encounter: Payer: Self-pay | Admitting: Nurse Practitioner

## 2018-12-09 ENCOUNTER — Other Ambulatory Visit: Payer: Self-pay | Admitting: *Deleted

## 2018-12-09 DIAGNOSIS — R269 Unspecified abnormalities of gait and mobility: Secondary | ICD-10-CM | POA: Diagnosis not present

## 2018-12-09 DIAGNOSIS — R1110 Vomiting, unspecified: Secondary | ICD-10-CM

## 2018-12-09 DIAGNOSIS — R001 Bradycardia, unspecified: Secondary | ICD-10-CM

## 2018-12-09 DIAGNOSIS — M25532 Pain in left wrist: Secondary | ICD-10-CM | POA: Diagnosis not present

## 2018-12-09 DIAGNOSIS — W19XXXA Unspecified fall, initial encounter: Secondary | ICD-10-CM

## 2018-12-09 LAB — COMPLETE METABOLIC PANEL WITH GFR
Albumin: 4.6
Calcium: 10.8
Carbon Dioxide, Total: 22
Chloride: 102
EGFR (Non-African Amer.): 63
Globulin: 2.4
Total Protein: 7 g/dL

## 2018-12-09 NOTE — Assessment & Plan Note (Addendum)
Sustained from fall 12/07/18, X-ray left wrist advanced OA with chondrocalcinosis versus calcium pyrophosphate dihydrate crystal deposition. Prn Tylenol is available to her.

## 2018-12-09 NOTE — Assessment & Plan Note (Signed)
12/07/18, close supervision for safety and care assistance. Lack of safety awareness and unsteady gait are contributory.

## 2018-12-09 NOTE — Assessment & Plan Note (Signed)
12/07/18, will update CBC/diff, CMP, observe.

## 2018-12-09 NOTE — Assessment & Plan Note (Signed)
HR in 50s, Atenolol may be contributory, will VS qshift x72 hours, then evaluate.

## 2018-12-09 NOTE — Progress Notes (Signed)
Location:  [facility] Room Number: [phone] Place of Service:  ALF [phone]) Provider:  Chipper OmanMast, ManXie  NP  Aneesa Romey X, NP  Patient Care Team: Ege Muckey X, NP as PCP - General (Internal Medicine) Donnetta HailBeekman, James F, MD (Rheumatology) Ernesto RutherfordGroat, Robert, MD (Ophthalmology) Nita SellsHall, John, MD (Dermatology) Crist FatHerrick, Benjamin W, MD as Attending Physician (Urology) Enslie Sahota X, NP as Nurse Practitioner (Internal Medicine)  Extended Emergency Contact Information Primary Emergency Contact: McClanahan,Susan Address: [address]          [place], [place] [address] [place] Home Phone: [phone] Mobile Phone: [phone] Relation: Daughter Secondary Emergency Contact: Gardiner SleeperBaker,Carolanne  [place] Home Phone: [phone] Relation: Daughter  Code Status:  DNR Goals of care: Advanced Directive information Advanced Directives 12/09/2018  Does Patient Have a Medical Advance Directive? Yes  Type of Advance Directive Out of facility DNR (pink MOST or yellow form)  Does patient want to make changes to medical advance directive? No - Patient declined  Copy of Healthcare Power of Attorney in Chart? -  Would patient like information on creating a medical advance directive? -  Pre-existing out of facility DNR order (yellow form or pink MOST form) Yellow form placed in chart (order not valid for inpatient use);Pink MOST form placed in chart (order not valid for inpatient use)     Chief Complaint  Patient presents with   Acute Visit    C/o - (L) wrist pain    HPI:  Pt is a 85 y.o. female seen today for an acute visit for the left wrist pain sustained from a fall 12/07/18 when the patient was found on floor at entranceof her doorway, stated feeling sick and vomited x1. She is afebrile, denied headache, change of vision, chest pain/pressure, palpitation, nausea, vomiting, diarrhea, or constipation. She denied dysuria, urinary frequency or urgency.  HPI was provided with assistance of staff. [facility]. HTN, blood pressure is controlled on Atenolol 50mg  qd, her HR is 52 bpm noted. Polymyalgia rheumatica, on Prednisone 3mg  qd.    Past Medical History:  Diagnosis Date   Acute bronchitis 06/27/2015   Alzheimer's dementia (HCC)    Cataract    Cellulitis of left leg    Depression    Eczema    High cholesterol    History of renal stone    excision   Hypertension    Mild cognitive impairment    Osteoarthritis    Polymyalgia rheumatica (HCC)    rx with prednisone   Urinary incontinence    Varicose veins    Xerosis of skin    Past Surgical History:  Procedure Laterality Date   cataract surgery     EYE SURGERY     JOINT REPLACEMENT Bilateral    Dr. Trudee GripFrank Alusio   kidney stone surgey     lipoma removal      Allergies  Allergen Reactions   Donepezil Other (See Comments)    BAD DREAMS   Namenda [Memantine]    Remeron [Mirtazapine] Other (See Comments)    Dizzy     Outpatient Encounter Medications as of 12/09/2018  Medication Sig   acetaminophen (TYLENOL) 500 MG tablet Take 2 tablets (1,000 mg total) by mouth every 8 (eight) hours as needed.   atenolol (TENORMIN) 50 MG tablet Take 1 tablet (50 mg total) by mouth daily.   calcium carbonate (OSCAL) 1500 (600 Ca) MG TABS tablet Take 600 mg of  elemental calcium by mouth 2 (two) times daily with a meal.   carboxymethylcellulose (REFRESH PLUS) 0.5 % SOLN Place 1 drop into both eyes 4 (four) times daily.    cholecalciferol (VITAMIN D) 1000 units tablet Take 1,000 Units by mouth daily.   Eyelid Cleansers (AVENOVA EX) Apply 1 application topically 2 (two) times daily.   hydrocortisone cream 1 % Apply 1 application topically as needed. Apply to the chest    Multiple Vitamin (MULTIVITAMIN WITH MINERALS) TABS tablet Take 1 tablet by mouth daily.   Omega-3 Fatty Acids (FISH OIL) 1200 MG CAPS Take 1 capsule by mouth  daily.    predniSONE (DELTASONE) 1 MG tablet Take 3 mg by mouth daily.    sertraline (ZOLOFT) 100 MG tablet Take 100 mg by mouth daily. Take with 25 mg = 125 mg.   sertraline (ZOLOFT) 25 MG tablet Take 25 mg by mouth daily. 100 mg +25 mg= 125 mg   UNABLE TO FIND Med Name: 120 SAL AC 30% in Petrolatum cream. Apply to right and left cheek dark spots at bedtime.   No facility-administered encounter medications on file as of 12/09/2018.     Review of Systems  Constitutional: Negative for activity change, appetite change, chills, diaphoresis, fatigue and fever.  HENT: Positive for hearing loss. Negative for congestion and voice change.   Eyes: Negative for visual disturbance.  Respiratory: Negative for cough, shortness of breath and wheezing.   Cardiovascular: Positive for leg swelling. Negative for chest pain and palpitations.  Gastrointestinal: Negative for abdominal distention, abdominal pain, constipation, diarrhea, nausea and vomiting.  Genitourinary: Negative for difficulty urinating, dysuria and urgency.  Musculoskeletal: Positive for arthralgias, back pain and gait problem.  Skin: Positive for color change. Negative for pallor.       Pigmented skin changes BLE.   Neurological: Negative for dizziness, speech difficulty, weakness and headaches.       Dementia  Psychiatric/Behavioral: Negative for agitation, behavioral problems, hallucinations and sleep disturbance. The patient is not nervous/anxious.     Immunization History  Administered Date(s) Administered   Influenza Whole 05/28/2012, 06/01/2018   Influenza, High Dose Seasonal PF 06/08/2016   Influenza-Unspecified 05/28/2014, 08/28/2014, 05/27/2015, 06/18/2017   Pneumococcal Conjugate-13 01/21/2014   Pneumococcal Polysaccharide-23 04/16/2015   Td 09/29/2003   Zoster 01/14/2011   Pertinent  Health Maintenance Due  Topic Date Due   INFLUENZA VACCINE  03/29/2019   DEXA SCAN  Completed   PNA vac Low Risk Adult   Completed   Fall Risk  05/17/2018 05/10/2017 05/01/2017 07/17/2016 06/09/2015  Falls in the past year? No No No Yes No  Comment - - - tripped over the last stair in the house -  Number falls in past yr: - - - 1 -   Functional Status Survey:    Vitals:   12/09/18 1036  BP: 110/68  Pulse: (!) 52  Resp: 17  Temp: (!) 96.3 F (35.7 C)  SpO2: 95%  Weight: 180 lb (81.6 kg)  Height:  (1.626 m)   Body mass index is 30.9 kg/m. Physical Exam Vitals signs and nursing note reviewed.  Constitutional:      General: She is not in acute distress.    Appearance: Normal appearance. She is not ill-appearing, toxic-appearing or diaphoretic.  HENT:     Head: Normocephalic and atraumatic.     Nose: Nose normal.     Mouth/Throat:     Mouth: Mucous membranes are moist.  Eyes:     Extraocular Movements: Extraocular movements  intact.     Pupils: Pupils are equal, round, and reactive to light.  Neck:     Musculoskeletal: Normal range of motion and neck supple.  Cardiovascular:     Rate and Rhythm: Normal rate and regular rhythm.     Heart sounds: No murmur.  Pulmonary:     Effort: Pulmonary effort is normal.     Breath sounds: No wheezing, rhonchi or rales.  Abdominal:     General: There is no distension.     Palpations: Abdomen is soft.     Tenderness: There is no abdominal tenderness. There is no guarding or rebound.  Musculoskeletal:     Right lower leg: Edema present.     Left lower leg: Edema present.     Comments: Trace edema in BLE, left wrist.   Skin:    General: Skin is warm and dry.     Findings: Erythema present.     Comments: Slight left wrist erythema. Dark pigmented skin change BLE  Neurological:     General: No focal deficit present.     Mental Status: She is alert. Mental status is at baseline.     Cranial Nerves: No cranial nerve deficit.     Motor: No weakness.     Coordination: Coordination normal.     Gait: Gait abnormal.     Comments: Oriented to person.     Psychiatric:        Mood and Affect: Mood normal.        Behavior: Behavior normal.     Labs reviewed: Recent Labs    01/24/18 1227 04/09/18 11/21/18  NA 140 139 137  K 4.9 3.9 4.4  CL 102 106 102  CO2 30 28 22   GLUCOSE 146*  --   --   BUN 20 18 27*  CREATININE 0.67 0.6 0.8  CALCIUM 9.8 9.2 10.8   Recent Labs    01/24/18 1227 04/09/18 11/21/18  AST 42* 19 30  ALT 32 16 23  ALKPHOS 73 67 74  BILITOT 1.3*  --   --   PROT 7.5 5.6 7.0  ALBUMIN 4.5 3.7 4.6   Recent Labs    01/24/18 1227 04/09/18 11/21/18  WBC 10.6* 7.7 11.9  NEUTROABS 8.4*  --   --   HGB 16.4* 13.7 15.4  HCT 48.3* 41 45  MCV 87.0  --   --   PLT 142* 218 299   Lab Results  Component Value Date   TSH 2.77 04/09/2018   Lab Results  Component Value Date   HGBA1C 5.3 04/26/2017   Lab Results  Component Value Date   CHOL 217 (A) 04/09/2018   HDL 51 04/09/2018   LDLCALC 147 04/09/2018   LDLDIRECT 173.0 08/07/2012   TRIG 86 04/09/2018   CHOLHDL 5 03/20/2016    Significant Diagnostic Results in last 30 days:  No results found.  Assessment/Plan Acute pain of left wrist Sustained from fall 12/07/18, X-ray left wrist advanced OA with chondrocalcinosis versus calcium pyrophosphate dihydrate crystal deposition. Prn Tylenol is available to her.   Fall 12/07/18, close supervision for safety and care assistance. Lack of safety awareness and unsteady gait are contributory.   Abnormal gait Continue to ambulate with walker, may consider PT to eval and treat as indicated.   Vomiting 12/07/18, will update CBC/diff, CMP, observe.   Bradycardia HR in 50s, Atenolol may be contributory, will VS qshift x72 hours, then evaluate.      Family/ staff Communication: plan of care reviewed  with the patient and charge nurse.   Labs/tests ordered:  CBC/diff, CMP   Time spend 25 minutes.

## 2018-12-09 NOTE — Assessment & Plan Note (Signed)
Continue to ambulate with walker, may consider PT to eval and treat as indicated.

## 2018-12-10 ENCOUNTER — Encounter: Payer: Self-pay | Admitting: Nurse Practitioner

## 2018-12-10 ENCOUNTER — Non-Acute Institutional Stay: Payer: Medicare Other | Admitting: Nurse Practitioner

## 2018-12-10 DIAGNOSIS — R001 Bradycardia, unspecified: Secondary | ICD-10-CM

## 2018-12-10 DIAGNOSIS — F0280 Dementia in other diseases classified elsewhere without behavioral disturbance: Secondary | ICD-10-CM

## 2018-12-10 DIAGNOSIS — I10 Essential (primary) hypertension: Secondary | ICD-10-CM | POA: Diagnosis not present

## 2018-12-10 DIAGNOSIS — R509 Fever, unspecified: Secondary | ICD-10-CM | POA: Insufficient documentation

## 2018-12-10 DIAGNOSIS — G301 Alzheimer's disease with late onset: Secondary | ICD-10-CM | POA: Diagnosis not present

## 2018-12-10 LAB — BASIC METABOLIC PANEL
BUN: 24 — AB (ref 4–21)
Creatinine: 0.6 (ref 0.5–1.1)
Glucose: 86
Potassium: 3.7 (ref 3.4–5.3)
Sodium: 141 (ref 137–147)

## 2018-12-10 LAB — CBC AND DIFFERENTIAL
HCT: 41 (ref 36–46)
Hemoglobin: 13.7 (ref 12.0–16.0)
Platelets: 220 (ref 150–399)
WBC: 7.8

## 2018-12-10 LAB — HEPATIC FUNCTION PANEL
ALT: 15 (ref 7–35)
AST: 17 (ref 13–35)
Alkaline Phosphatase: 65 (ref 25–125)
Bilirubin, Total: 0.4

## 2018-12-10 NOTE — Assessment & Plan Note (Signed)
Reported 47bpm per nursing, measured with puls oximeter. Apical pulse 64bpm upon my examination. Will decreases Atenolol to 25mg  qd, monitor AP, blood pressure q shift, re-eval.

## 2018-12-10 NOTE — Progress Notes (Signed)
Location:   [facility] Room Number: 932 Place of Service:  ALF (13) Provider: Lennie Odor Lareina Espino NP  Aubrii Sharpless X, NP  Patient Care Team: Peighton Mehra X, NP as PCP - General (Internal Medicine) Hennie Duos, MD (Rheumatology) Clent Jacks, MD (Ophthalmology) Allyn Kenner, MD (Dermatology) Ardis Hughs, MD as Attending Physician (Urology) Deshawna Mcneece X, NP as Nurse Practitioner (Internal Medicine)  Extended Emergency Contact Information Primary Emergency Contact: McClanahan,Susan Address: [place], [place] [address] Johnnette Litter of [place] Phone: [phone] Mobile Phone: [phone] Relation: Daughter Secondary Emergency Contact: Newman N[place] of [place] Phone: [phone] Relation: Daughter  Code Status: DNR Goals of care: Advanced Directive information Advanced Directives 12/10/2018  Does Patient Have a Medical Advance Directive? Yes  Type of Advance Directive Out of facility DNR (pink MOST or yellow form)  Does patient want to make changes to medical advance directive? No - Patient declined  Copy of [place] in Chart? -  Would patient like information on creating a medical advance directive? -  Pre-existing out of facility DNR order (yellow form or pink MOST form) Yellow form placed in chart (order not valid for inpatient use)     Chief Complaint  Patient presents with   Acute Visit    low grade fever     HPI:  Pt is a 85 y.o. female seen today for an acute visit for reported low grade temperature, T 98.1 upon my visit, reported low pulses, but apical heart beats was 64 bpm when the patient was examined in sitting position at edge of her bed. She denied headache, sore throat, cough, chest pain/pressures, palpitation, SOB, chills, generalized aches/pains. She denied abd pain, dysuria, lower back discomfort, diarrhea, or constipation. HPI was provided with assistance of staff.  Pending CBC/diff, CMP/eGFR today.   Past Medical History:  Diagnosis Date   Acute bronchitis 06/27/2015   Alzheimer's dementia ([place])    Cataract    Cellulitis of left leg    Depression    Eczema    High cholesterol    History of renal stone    excision   Hypertension    Mild cognitive impairment    Osteoarthritis    Polymyalgia rheumatica (HCC)    rx with prednisone   Urinary incontinence    Varicose veins    Xerosis of skin    Past Surgical History:  Procedure Laterality Date   cataract surgery     EYE SURGERY     JOINT REPLACEMENT Bilateral    Dr. Hector Shade   kidney stone surgey     lipoma removal      Allergies  Allergen Reactions   Donepezil Other (See Comments)    BAD DREAMS   Namenda [Memantine]    Remeron [Mirtazapine] Other (See Comments)    Dizzy     Allergies as of 12/10/2018      Reactions   Donepezil Other (See Comments)   BAD DREAMS   Namenda [memantine]    Remeron [mirtazapine] Other (See Comments)   Dizzy       Medication List       Accurate as of December 10, 2018 11:59 PM. Always use your most recent med list.        acetaminophen 500 MG tablet Commonly known as:  TYLENOL Take 2 tablets (1,000 mg total) by mouth every 8 (eight) hours as needed.   atenolol  50 MG tablet Commonly known as:  TENORMIN Take 1 tablet (50 mg total) by mouth daily.   AVENOVA EX Apply 1 application topically 2 (two) times daily.   calcium carbonate 1500 (600 Ca) MG Tabs tablet Commonly known as:  OSCAL Take 600 mg of elemental calcium by mouth 2 (two) times daily with a meal.   carboxymethylcellulose 0.5 % Soln Commonly known as:  REFRESH PLUS Place 1 drop into both eyes 4 (four) times daily.   cholecalciferol 25 MCG (1000 UT) tablet Commonly known as:  VITAMIN D Take 1,000 Units by mouth daily.   Fish Oil 1200 MG Caps Take 1 capsule by mouth daily.   hydrocortisone cream 1 % Apply 1 application topically as needed.  Apply to the chest   multivitamin with minerals Tabs tablet Take 1 tablet by mouth daily.   predniSONE 1 MG tablet Commonly known as:  DELTASONE Take 3 mg by mouth daily.   sertraline 100 MG tablet Commonly known as:  ZOLOFT Take 100 mg by mouth daily. Take with 25 mg = 125 mg.   sertraline 25 MG tablet Commonly known as:  ZOLOFT Take 25 mg by mouth daily. 100 mg +25 mg= 125 mg   UNABLE TO FIND Med Name: 120 SAL AC 30% in Petrolatum cream. Apply to right and left cheek dark spots at bedtime.      ROS was provided with assistance of staff.  Review of Systems  Constitutional: Positive for fatigue and fever. Negative for activity change, appetite change, chills and diaphoresis.  HENT: Positive for hearing loss. Negative for congestion, rhinorrhea, sinus pressure, sinus pain and voice change.   Eyes: Negative for visual disturbance.  Respiratory: Negative for cough, shortness of breath and wheezing.   Cardiovascular: Positive for leg swelling. Negative for chest pain and palpitations.  Gastrointestinal: Negative for abdominal distention, abdominal pain, constipation, diarrhea, nausea and vomiting.  Genitourinary: Negative for difficulty urinating, dysuria and urgency.  Musculoskeletal: Positive for arthralgias and gait problem. Negative for back pain.       Left wrist pain.   Skin: Positive for color change and pallor.       Pigmented skin changes BLE  Neurological: Negative for dizziness, speech difficulty, weakness and headaches.       Dementia  Psychiatric/Behavioral: Positive for confusion. Negative for agitation, behavioral problems, hallucinations and sleep disturbance. The patient is not nervous/anxious.     Immunization History  Administered Date(s) Administered   Influenza Whole 05/28/2012, 06/01/2018   Influenza, High Dose Seasonal PF 06/08/2016   Influenza-Unspecified 05/28/2014, 08/28/2014, 05/27/2015, 06/18/2017   Pneumococcal Conjugate-13 01/21/2014    Pneumococcal Polysaccharide-23 04/16/2015   Td 09/29/2003   Zoster 01/14/2011   Pertinent  Health Maintenance Due  Topic Date Due   INFLUENZA VACCINE  03/29/2019   DEXA SCAN  Completed   PNA vac Low Risk Adult  Completed   Fall Risk  05/17/2018 05/10/2017 05/01/2017 07/17/2016 06/09/2015  Falls in the past year? No No No Yes No  Comment - - - tripped over the last stair in the house -  Number falls in past yr: - - - 1 -   Functional Status Survey:    Vitals:   12/10/18 1257  BP: 110/60  Pulse: (!) 47  Resp: 18  Temp: 98.1 F (36.7 C)  SpO2: 95%  Weight: 180 lb (81.6 kg)  Height: 5' 4"  (1.626 m)   Body mass index is 30.9 kg/m. Physical Exam Constitutional:      General: She is  not in acute distress.    Appearance: Normal appearance. She is not ill-appearing, toxic-appearing or diaphoretic.  HENT:     Head: Normocephalic and atraumatic.     Nose: Nose normal.     Mouth/Throat:     Mouth: Mucous membranes are moist.  Eyes:     Extraocular Movements: Extraocular movements intact.     Conjunctiva/sclera: Conjunctivae normal.     Pupils: Pupils are equal, round, and reactive to light.  Neck:     Musculoskeletal: Normal range of motion and neck supple.  Cardiovascular:     Rate and Rhythm: Normal rate and regular rhythm.     Heart sounds: No murmur.  Pulmonary:     Effort: Pulmonary effort is normal.     Breath sounds: No wheezing, rhonchi or rales.  Abdominal:     General: There is no distension.     Palpations: Abdomen is soft.     Tenderness: There is no abdominal tenderness. There is no guarding or rebound.  Musculoskeletal:     Right lower leg: Edema present.     Left lower leg: Edema present.     Comments: Trace edema BLE, left wrist.   Skin:    General: Skin is warm and dry.     Comments: Dark pigmented skin changes BLE  Neurological:     General: No focal deficit present.     Mental Status: She is alert. Mental status is at baseline.     Motor: No  weakness.     Coordination: Coordination normal.     Gait: Gait abnormal.     Comments: Oriented to self.   Psychiatric:        Mood and Affect: Mood normal.        Behavior: Behavior normal.     Labs reviewed: Recent Labs    01/24/18 1227 04/09/18 11/21/18  NA 140 139 137  K 4.9 3.9 4.4  CL 102 106 102  CO2 30 28 22   GLUCOSE 146*  --   --   BUN 20 18 27*  CREATININE 0.67 0.6 0.8  CALCIUM 9.8 9.2 10.8   Recent Labs    01/24/18 1227 04/09/18 11/21/18  AST 42* 19 30  ALT 32 16 23  ALKPHOS 73 67 74  BILITOT 1.3*  --   --   PROT 7.5 5.6 7.0  ALBUMIN 4.5 3.7 4.6   Recent Labs    01/24/18 1227 04/09/18 11/21/18  WBC 10.6* 7.7 11.9  NEUTROABS 8.4*  --   --   HGB 16.4* 13.7 15.4  HCT 48.3* 41 45  MCV 87.0  --   --   PLT 142* 218 299   Lab Results  Component Value Date   TSH 2.77 04/09/2018   Lab Results  Component Value Date   HGBA1C 5.3 04/26/2017   Lab Results  Component Value Date   CHOL 217 (A) 04/09/2018   HDL 51 04/09/2018   LDLCALC 147 04/09/2018   LDLDIRECT 173.0 08/07/2012   TRIG 86 04/09/2018   CHOLHDL 5 03/20/2016    Significant Diagnostic Results in last 30 days:  No results found.  Assessment/Plan: Low grade fever Resolved, pending CBC/diff, CMP/eGFR, observe.   Essential hypertension, benign Controlled, continue Atenolol.   Bradycardia Reported 47bpm per nursing, measured with puls oximeter. Apical pulse 64bpm upon my examination. Will decreases Atenolol to 36m qd, monitor AP, blood pressure q shift, re-eval.   Late onset Alzheimer's disease without behavioral disturbance Continue [facility] for safety and care  assistance. Risk for falling due to lack of safety awareness and increased frailty.     Family/ staff Communication: plan of care reviewed with the patient and charge nurse.   Labs/tests ordered:  Pending CBC/diff, CMP  Time spend 40 minutes.

## 2018-12-10 NOTE — Assessment & Plan Note (Signed)
Controlled, continue Atenolol 

## 2018-12-10 NOTE — Assessment & Plan Note (Signed)
Continue [facility] for safety and care assistance. Risk for falling due to lack of safety awareness and increased frailty.

## 2018-12-10 NOTE — Assessment & Plan Note (Signed)
Resolved, pending CBC/diff, CMP/eGFR, observe.

## 2018-12-11 ENCOUNTER — Encounter: Payer: Self-pay | Admitting: Nurse Practitioner

## 2018-12-20 ENCOUNTER — Non-Acute Institutional Stay: Payer: Medicare Other | Admitting: Internal Medicine

## 2018-12-20 ENCOUNTER — Encounter: Payer: Self-pay | Admitting: Internal Medicine

## 2018-12-20 DIAGNOSIS — G301 Alzheimer's disease with late onset: Secondary | ICD-10-CM

## 2018-12-20 DIAGNOSIS — I10 Essential (primary) hypertension: Secondary | ICD-10-CM | POA: Diagnosis not present

## 2018-12-20 DIAGNOSIS — F418 Other specified anxiety disorders: Secondary | ICD-10-CM

## 2018-12-20 DIAGNOSIS — M353 Polymyalgia rheumatica: Secondary | ICD-10-CM | POA: Diagnosis not present

## 2018-12-20 DIAGNOSIS — F0280 Dementia in other diseases classified elsewhere without behavioral disturbance: Secondary | ICD-10-CM

## 2018-12-20 NOTE — Progress Notes (Signed)
Location:  [facility] Room Number: [phone] Place of Service:  ALF [id number](13) Provider:  Einar CrowGupta, Nollan Muldrow  MD  Mast, Man X, NP  Patient Care Team: Mast, Man X, NP as PCP - General (Internal Medicine) Donnetta HailBeekman, James F, MD (Rheumatology) Ernesto RutherfordGroat, Robert, MD (Ophthalmology) Nita SellsHall, John, MD (Dermatology) Crist FatHerrick, Benjamin W, MD as Attending Physician (Urology) Mast, Man X, NP as Nurse Practitioner (Internal Medicine)  Extended Emergency Contact Information Primary Emergency Contact: McClanahan,Susan Address: [address]          [place], [place] [address] [place] Home Phone: [phone] Mobile Phone: [phone] Relation: Daughter Secondary Emergency Contact: Gardiner SleeperBaker,Carolanne  [place] Home Phone: [phone] Relation: Daughter  Code Status:  DNR Goals of care: Advanced Directive information Advanced Directives 12/10/2018  Does Patient Have a Medical Advance Directive? Yes  Type of Advance Directive Out of facility DNR (pink MOST or yellow form)  Does patient want to make changes to medical advance directive? No - Patient declined  Copy of Healthcare Power of Attorney in Chart? -  Would patient like information on creating a medical advance directive? -  Pre-existing out of facility DNR order (yellow form or pink MOST form) Yellow form placed in chart (order not valid for inpatient use)     Chief Complaint  Patient presents with  . Acute Visit    C/o- behavior issues    HPI:  Pt is a 85 y.o. female seen today for an acute visit for Behavior Issues and possible needing higher level of care. Patient has h/o Frontal temporal Dementia with aphasia, Hypertension, PMR on Chronic Prednisone, Anxiety with Depression Patient lives in ALF. She was evaluated today as she is needing higher level of care and their is plan to move her to  SNF. Patient unable to give any history. She was slightly anxious but cooperative during exam  and will try to answer questions but continues to  have Aphasia. Per nurses she is now incontinent on her urine and needing more help with her ADLS. She has had falls too.  Patient use to have her daughter visit her and is not able to come due to restrictions.  It has affected patient's behavior. Patient has fair appetite and her weight is stable    Past Medical History:  Diagnosis Date  . Acute bronchitis 06/27/2015  . Alzheimer's dementia (HCC)   . Cataract   . Cellulitis of left leg   . Depression   . Eczema   . High cholesterol   . History of renal stone    excision  . Hypertension   . Mild cognitive impairment   . Osteoarthritis   . Polymyalgia rheumatica (HCC)    rx with prednisone  . Urinary incontinence   . Varicose veins   . Xerosis of skin    Past Surgical History:  Procedure Laterality Date  . cataract surgery    . EYE SURGERY    . JOINT REPLACEMENT Bilateral    Dr. Trudee GripFrank Alusio  . kidney stone surgey    . lipoma removal      Allergies  Allergen Reactions  . Donepezil Other (See Comments)    BAD DREAMS  . Namenda [Memantine]   . Remeron [Mirtazapine] Other (See Comments)    Dizzy     Outpatient Encounter Medications as of 12/20/2018  Medication Sig  . acetaminophen (TYLENOL) 500 MG tablet Take 2 tablets (1,000 mg total) by mouth every 8 (eight) hours as needed.  .Marland Kitchen  atenolol (TENORMIN) 50 MG tablet Take 1 tablet (50 mg total) by mouth daily.  . carboxymethylcellulose (REFRESH PLUS) 0.5 % SOLN Place 1 drop into both eyes 4 (four) times daily.   . cholecalciferol (VITAMIN D) 1000 units tablet Take 1,000 Units by mouth daily.  . Eyelid Cleansers (AVENOVA EX) Apply 1 application topically 2 (two) times daily.  . hydrocortisone cream 1 % Apply 1 application topically as needed. Apply to the chest   . Multiple Vitamin (MULTIVITAMIN WITH MINERALS) TABS tablet Take 1 tablet by mouth daily.  . Omega-3 Fatty Acids (FISH OIL) 1200 MG CAPS Take 1 capsule by mouth  daily.   . predniSONE (DELTASONE) 1 MG tablet Take 3 mg by mouth daily.   . sertraline (ZOLOFT) 100 MG tablet Take 100 mg by mouth daily. Take with 25 mg = 125 mg.  . sertraline (ZOLOFT) 25 MG tablet Take 25 mg by mouth daily. 100 mg +25 mg= 125 mg  . UNABLE TO FIND Med Name: 120 SAL AC 30% in Petrolatum cream. Apply to right and left cheek dark spots at bedtime.  . calcium carbonate (OSCAL) 1500 (600 Ca) MG TABS tablet Take 600 mg of elemental calcium by mouth 2 (two) times daily with a meal.   No facility-administered encounter medications on file as of 12/20/2018.     Review of Systems  Constitutional: Negative.   HENT: Negative.   Respiratory: Negative.   Cardiovascular: Negative.   Gastrointestinal: Negative.   Genitourinary: Negative.   Musculoskeletal: Negative.   Skin: Negative.   Neurological: Negative.   Psychiatric/Behavioral: Positive for confusion and dysphoric mood. The patient is nervous/anxious.     Immunization History  Administered Date(s) Administered  . Influenza Whole 05/28/2012, 06/01/2018  . Influenza, High Dose Seasonal PF 06/08/2016  . Influenza-Unspecified 05/28/2014, 08/28/2014, 05/27/2015, 06/18/2017  . Pneumococcal Conjugate-13 01/21/2014  . Pneumococcal Polysaccharide-23 04/16/2015  . Td 09/29/2003  . Zoster 01/14/2011   Pertinent  Health Maintenance Due  Topic Date Due  . INFLUENZA VACCINE  03/29/2019  . DEXA SCAN  Completed  . PNA vac Low Risk Adult  Completed   Fall Risk  05/17/2018 05/10/2017 05/01/2017 07/17/2016 06/09/2015  Falls in the past year? No No No Yes No  Comment - - - tripped over the last stair in the house -  Number falls in past yr: - - - 1 -   Functional Status Survey:    Vitals:   12/20/18 1211  BP: 130/80  Pulse: 63  Resp: 18  Temp: 98 F (36.7 C)  SpO2: 96%  Weight: 180 lb (81.6 kg)  Height:  (1.626 m)   Body mass index is 30.9 kg/m. Physical Exam Vitals signs reviewed.  Constitutional:       Appearance: Normal appearance.  HENT:     Head: Normocephalic.     Nose: Nose normal.     Mouth/Throat:     Mouth: Mucous membranes are moist.     Pharynx: Oropharynx is clear.  Eyes:     Pupils: Pupils are equal, round, and reactive to light.  Cardiovascular:     Rate and Rhythm: Normal rate and regular rhythm.     Pulses: Normal pulses.     Heart sounds: Normal heart sounds.  Pulmonary:     Effort: Pulmonary effort is normal.     Breath sounds: Normal breath sounds.  Abdominal:     General: Abdomen is flat. Bowel sounds are normal.     Palpations: Abdomen is soft.  Musculoskeletal:  General: No swelling.  Skin:    General: Skin is warm and dry.  Neurological:     General: No focal deficit present.     Mental Status: She is alert.     Comments: Patient was not oriented to time place. She did not knew her DOB.or age. She could not name her children names. She could not name objects. Would follow commands.   Psychiatric:        Mood and Affect: Mood normal.        Thought Content: Thought content normal.        Judgment: Judgment normal.     Labs reviewed: Recent Labs    01/24/18 1227 04/09/18 11/21/18  NA 140 139 137  K 4.9 3.9 4.4  CL 102 106 102  CO2 30 28 22   GLUCOSE 146*  --   --   BUN 20 18 27*  CREATININE 0.67 0.6 0.8  CALCIUM 9.8 9.2 10.8   Recent Labs    01/24/18 1227 04/09/18 11/21/18  AST 42* 19 30  ALT 32 16 23  ALKPHOS 73 67 74  BILITOT 1.3*  --   --   PROT 7.5 5.6 7.0  ALBUMIN 4.5 3.7 4.6   Recent Labs    01/24/18 1227 04/09/18 11/21/18  WBC 10.6* 7.7 11.9  NEUTROABS 8.4*  --   --   HGB 16.4* 13.7 15.4  HCT 48.3* 41 45  MCV 87.0  --   --   PLT 142* 218 299   Lab Results  Component Value Date   TSH 2.77 04/09/2018   Lab Results  Component Value Date   HGBA1C 5.3 04/26/2017   Lab Results  Component Value Date   CHOL 217 (A) 04/09/2018   HDL 51 04/09/2018   LDLCALC 147 04/09/2018   LDLDIRECT 173.0 08/07/2012   TRIG 86  04/09/2018   CHOLHDL 5 03/20/2016    Significant Diagnostic Results in last 30 days:  No results found.  Assessment/Plan Essential hypertension, benign Patient BP controlled on Atenelol  Polymyalgia rheumatica (HCC) Continue on Prednisone DEXA scan in 10/18 showed Tscore -1.0  Depression with anxiety Stable on Zoloft  Late onset Alzheimer's disease without behavioral disturbance  Patient has worsening of her dementia recently She has not tolerated Aricept and Namenda in the past.   She had some labs done few weeks ago and they were completely normal.   I had a long discussion with her daughter and we both agreed that she probably had recent worsening as her daughter was helping her and is not able to come due to restriction.   She is  aware the patient would need higher level of care.  Her  daughter wants to be there when the move is made.  I have relayed that information to the nurses.     Family/ staff Communication:   Labs/tests ordered:   Total time spent in this patient care encounter was  40_  minutes; greater than 50% of the visit spent counseling patient and staff, reviewing records , Labs and coordinating care for problems addressed at this encounter.

## 2019-01-30 ENCOUNTER — Encounter: Payer: Self-pay | Admitting: Nurse Practitioner

## 2019-01-30 NOTE — Progress Notes (Signed)
Opened in error

## 2019-02-18 ENCOUNTER — Encounter: Payer: Self-pay | Admitting: Internal Medicine

## 2019-02-18 ENCOUNTER — Non-Acute Institutional Stay: Payer: Medicare Other | Admitting: Internal Medicine

## 2019-02-18 DIAGNOSIS — G301 Alzheimer's disease with late onset: Secondary | ICD-10-CM | POA: Diagnosis not present

## 2019-02-18 DIAGNOSIS — F0280 Dementia in other diseases classified elsewhere without behavioral disturbance: Secondary | ICD-10-CM

## 2019-02-18 DIAGNOSIS — I10 Essential (primary) hypertension: Secondary | ICD-10-CM | POA: Diagnosis not present

## 2019-02-18 NOTE — Progress Notes (Signed)
Location:  [facility] Room Number: 801A Place of Service:  ALF [phone] Provider:  Dr. Einar CrowAnjali Gupta   Mast, Man X, NP  Patient Care Team: Mast, Man X, NP as PCP - General (Internal Medicine) Donnetta HailBeekman, James F, MD (Rheumatology) Ernesto RutherfordGroat, Robert, MD (Ophthalmology) Nita SellsHall, John, MD (Dermatology) Crist FatHerrick, Benjamin W, MD as Attending Physician (Urology) Mast, Man X, NP as Nurse Practitioner (Internal Medicine)  Extended Emergency Contact Information Primary Emergency Contact: McClanahan,Susan Address: [address]          [place], [place] [address] [place] Home Phone: [phone] Mobile Phone: [phone] Relation: Daughter Secondary Emergency Contact: Gardiner SleeperBaker,Carolanne  [place] Home Phone: [phone] Relation: Daughter  Code Status:  DNR Goals of care: Advanced Directive information Advanced Directives 02/18/2019  Does Patient Have a Medical Advance Directive? Yes  Type of Advance Directive Living will;Out of facility DNR (pink MOST or yellow form)  Does patient want to make changes to medical advance directive? No - Patient declined  Copy of Healthcare Power of Attorney in Chart? -  Would patient like information on creating a medical advance directive? -  Pre-existing out of facility DNR order (yellow form or pink MOST form) Yellow form placed in chart (order not valid for inpatient use);Pink MOST form placed in chart (order not valid for inpatient use)     Chief Complaint  Patient presents with  . Acute Visit    Worsening Dementia     HPI:  Pt is a 85 y.o. female seen today for an acute visit for Worsening Dementia and needing Higher Level of care. Visit requested By Family  Patient has h/o Frontal temporal Dementia with aphasia, Hypertension, PMR on Chronic Prednisone, Anxiety with Depression Patient lives in ALF. Patient has had Recent decline specially due to cough and restriction her daughter cannot  come and visit her.  Patient continues to be very anxious and unable to keep up with her ADLs. Per Nurses patient requires a lot of help.  It has been discussed with the family they were hoping that she can continue AL till restrictions are over and the daughter can start coming back again.  They wanted me to see her today.  There is a care plan meeting tomorrow with the family special permission so that they can be with her when the changes made to skilled nursing Patient did not have any acute complaints. She got very anxious and kept repeating herself. Did not have any SOB or Cough Or fever  Past Medical History:  Diagnosis Date  . Acute bronchitis 06/27/2015  . Alzheimer's dementia (HCC)   . Cataract   . Cellulitis of left leg   . Depression   . Eczema   . High cholesterol   . History of renal stone    excision  . Hypertension   . Mild cognitive impairment   . Osteoarthritis   . Polymyalgia rheumatica (HCC)    rx with prednisone  . Urinary incontinence   . Varicose veins   . Xerosis of skin    Past Surgical History:  Procedure Laterality Date  . cataract surgery    . EYE SURGERY    . JOINT REPLACEMENT Bilateral    Dr. Trudee GripFrank Alusio  . kidney stone surgey    . lipoma removal      Allergies  Allergen Reactions  . Donepezil Other (See Comments)    BAD DREAMS  . Namenda [Memantine]   . Remeron [Mirtazapine] Other (  See Comments)    Dizzy     Outpatient Encounter Medications as of 02/18/2019  Medication Sig  . acetaminophen (TYLENOL) 500 MG tablet Take 2 tablets (1,000 mg total) by mouth every 8 (eight) hours as needed.  Marland Kitchen atenolol (TENORMIN) 25 MG tablet Take 25 mg by mouth daily.  . calcium carbonate (OSCAL) 1500 (600 Ca) MG TABS tablet Take 600 mg of elemental calcium by mouth 2 (two) times daily with a meal.  . carboxymethylcellulose (REFRESH PLUS) 0.5 % SOLN Place 1 drop into both eyes 4 (four) times daily.   . cholecalciferol (VITAMIN D) 1000 units tablet Take  1,000 Units by mouth daily.  . Eyelid Cleansers (AVENOVA EX) Apply 1 application topically 2 (two) times daily.  . hydrocortisone cream 1 % Apply 1 application topically as needed. Apply to the chest   . Multiple Vitamin (MULTIVITAMIN WITH MINERALS) TABS tablet Take 1 tablet by mouth daily.  . Omega-3 Fatty Acids (FISH OIL) 1200 MG CAPS Take 1 capsule by mouth daily.   . predniSONE (DELTASONE) 1 MG tablet Take 3 mg by mouth daily.   . sertraline (ZOLOFT) 100 MG tablet Take 100 mg by mouth daily. Take with 25 mg = 125 mg.  . sertraline (ZOLOFT) 25 MG tablet Take 25 mg by mouth daily. 100 mg +25 mg= 125 mg  . UNABLE TO FIND Med Name: 120 SAL AC 30% in Petrolatum cream. Apply to right and left cheek dark spots at bedtime.  . [DISCONTINUED] atenolol (TENORMIN) 50 MG tablet Take 1 tablet (50 mg total) by mouth daily.   No facility-administered encounter medications on file as of 02/18/2019.     Review of Systems  Constitutional: Negative.   HENT: Negative.   Respiratory: Negative.   Cardiovascular: Negative.   Genitourinary: Negative.   Musculoskeletal: Negative.   Neurological: Negative.   Psychiatric/Behavioral: Positive for confusion and dysphoric mood. The patient is nervous/anxious.     Immunization History  Administered Date(s) Administered  . Influenza Whole 05/28/2012, 06/01/2018  . Influenza, High Dose Seasonal PF 06/08/2016  . Influenza-Unspecified 05/28/2014, 08/28/2014, 05/27/2015, 06/18/2017  . Pneumococcal Conjugate-13 01/21/2014  . Pneumococcal Polysaccharide-23 04/16/2015  . Td 09/29/2003  . Zoster 01/14/2011   Pertinent  Health Maintenance Due  Topic Date Due  . INFLUENZA VACCINE  03/29/2019  . DEXA SCAN  Completed  . PNA vac Low Risk Adult  Completed   Fall Risk  05/17/2018 05/10/2017 05/01/2017 07/17/2016 06/09/2015  Falls in the past year? No No No Yes No  Comment - - - tripped over the last stair in the house -  Number falls in past yr: - - - 1 -   Functional  Status Survey:    Vitals:   02/18/19 1424  BP: 128/72  Pulse: 68  Resp: 18  Temp: (!) 97.3 F (36.3 C)  TempSrc: Oral  SpO2: 95%  Weight: 176 lb 3.2 oz (79.9 kg)  Height: 5\' 4"  (1.626 m)   Body mass index is 30.24 kg/m. Physical Exam Vitals signs reviewed.  Constitutional:      Appearance: Normal appearance.  HENT:     Head: Normocephalic.     Nose: Nose normal.     Mouth/Throat:     Mouth: Mucous membranes are moist.     Pharynx: Oropharynx is clear.  Eyes:     Pupils: Pupils are equal, round, and reactive to light.  Cardiovascular:     Rate and Rhythm: Normal rate and regular rhythm.     Pulses: Normal  pulses.     Heart sounds: Normal heart sounds.  Pulmonary:     Effort: Pulmonary effort is normal.     Breath sounds: Normal breath sounds.  Abdominal:     General: Abdomen is flat. Bowel sounds are normal.     Palpations: Abdomen is soft.  Musculoskeletal:     Comments: Mild swelling Bilateral  Skin:    General: Skin is warm and dry.  Neurological:     General: No focal deficit present.     Mental Status: She is alert.  Psychiatric:        Mood and Affect: Mood normal.     Comments: Very Anxious. Gets very upset if you ask lot of questions     Labs reviewed: Recent Labs    04/09/18 11/21/18 12/10/18  NA 139 137 141  K 3.9 4.4 3.7  CL 106 102  --   CO2 28 22  --   BUN 18 27* 24*  CREATININE 0.6 0.8 0.6  CALCIUM 9.2 10.8  --    Recent Labs    04/09/18 11/21/18 12/10/18  AST 19 30 17   ALT 16 23 15   ALKPHOS 67 74 65  PROT 5.6 7.0  --   ALBUMIN 3.7 4.6  --    Recent Labs    04/09/18 11/21/18 12/10/18  WBC 7.7 11.9 7.8  HGB 13.7 15.4 13.7  HCT 41 45 41  PLT 218 299 220   Lab Results  Component Value Date   TSH 2.77 04/09/2018   Lab Results  Component Value Date   HGBA1C 5.3 04/26/2017   Lab Results  Component Value Date   CHOL 217 (A) 04/09/2018   HDL 51 04/09/2018   LDLCALC 147 04/09/2018   LDLDIRECT 173.0 08/07/2012   TRIG 86  04/09/2018   CHOLHDL 5 03/20/2016    Significant Diagnostic Results in last 30 days:   Assessment/Plan Late onset Alzheimer's disease without behavioral disturbance  D/W the Nurse InCharge and Her D[place] Patient has progressive Dementia and there has been Worsening due to covid restrictions and her daughter not able to come to visit her.  Agree with nursing staff that she needs to be transferred to SNF for more care. Daughters want special permission for her to come and help her to settle. She will wear Full PPE when she will come to Facility. She will also be screened for Any Covid Exposure.  Her Other issues are stable  Essential hypertension, benign Her BP is stable on Tenormin Polymyalgia rheumatica (HCC) Patient is on Low dose of Prednisone DEXA scan in 10/18 Showed Tscore -1.0 Patietn is doing well with no Pain Depression with anxiety Stable on Zoloft    Family/ staff Communication:   Labs/tests ordered:   Total time spent in this patient care encounter was 25 _  minutes; greater than 50% of the visit spent counseling patient and staff, reviewing records , Labs and coordinating care for problems addressed at this encounter.

## 2019-02-25 NOTE — Addendum Note (Signed)
Addended by: Georgina Snell on: 02/25/2019 10:38 AM   Modules accepted: Level of Service

## 2019-03-20 ENCOUNTER — Encounter: Payer: Self-pay | Admitting: Internal Medicine

## 2019-03-20 ENCOUNTER — Non-Acute Institutional Stay (SKILLED_NURSING_FACILITY): Payer: Medicare Other | Admitting: Internal Medicine

## 2019-03-20 DIAGNOSIS — H1033 Unspecified acute conjunctivitis, bilateral: Secondary | ICD-10-CM

## 2019-03-20 DIAGNOSIS — M353 Polymyalgia rheumatica: Secondary | ICD-10-CM

## 2019-03-20 DIAGNOSIS — I872 Venous insufficiency (chronic) (peripheral): Secondary | ICD-10-CM

## 2019-03-20 DIAGNOSIS — G301 Alzheimer's disease with late onset: Secondary | ICD-10-CM | POA: Diagnosis not present

## 2019-03-20 DIAGNOSIS — I10 Essential (primary) hypertension: Secondary | ICD-10-CM | POA: Diagnosis not present

## 2019-03-20 DIAGNOSIS — F418 Other specified anxiety disorders: Secondary | ICD-10-CM

## 2019-03-20 DIAGNOSIS — F0280 Dementia in other diseases classified elsewhere without behavioral disturbance: Secondary | ICD-10-CM

## 2019-03-20 NOTE — Progress Notes (Signed)
Provider:Kathleena Freeman,MD    Location:  [facility] Room Number: 5/A Place of Service:  SNF (31)  PCP: Mast, Man X, NP Patient Care Team: Mast, Man X, NP as PCP - General (Internal Medicine) Donnetta HailBeekman, James F, MD (Rheumatology) Ernesto RutherfordGroat, Robert, MD (Ophthalmology) Nita SellsHall, John, MD (Dermatology) Crist FatHerrick, Benjamin W, MD as Attending Physician (Urology) Mast, Man X, NP as Nurse Practitioner (Internal Medicine)  Extended Emergency Contact Information Primary Emergency Contact: McClanahan,Susan Address: [address]          [place], [place] [address] [place] Home Phone: [phone] Mobile Phone: [phone] Relation: Daughter Secondary Emergency Contact: Gardiner SleeperBaker,Carolanne  [place] Home Phone: [phone] Relation: Daughter  Code Status:DNR Goals of Care: Advanced Directive information Advanced Directives 03/20/2019  Does Patient Have a Medical Advance Directive? Yes  Type of Advance Directive Out of facility DNR (pink MOST or yellow form)  Does patient want to make changes to medical advance directive? No - Patient declined  Copy of Healthcare Power of Attorney in Chart? -  Would patient like information on creating a medical advance directive? -  Pre-existing out of facility DNR order (yellow form or pink MOST form) Yellow form placed in chart (order not valid for inpatient use);Pink MOST form placed in chart (order not valid for inpatient use)      Chief Complaint  Patient presents with   New Admit To SNF    New admit to facility     HPI: Patient is a 85 y.o. female seen today for admission to SNF   Patient has h/o Frontal temporal Dementia with aphasia, Hypertension, PMR on Chronic Prednisone, Anxiety with Depression Patient was recently transferred From AL to SNF due to worsening dementia and inability inability to take care of her ADLs.  Her decline was linked to also with her daughter unable to visit due  to Covid restrictions. Patient was seen in SNF today.  Nurses did not have any acute complaints.  Patient did look very depressed and kept on asking why she is here. She did complain her eyes itching. Past Medical History:  Diagnosis Date   Acute bronchitis 06/27/2015   Alzheimer's dementia (HCC)    Cataract    Cellulitis of left leg    Depression    Eczema    High cholesterol    History of renal stone    excision   Hypertension    Mild cognitive impairment    Osteoarthritis    Polymyalgia rheumatica (HCC)    rx with prednisone   Urinary incontinence    Varicose veins    Xerosis of skin    Past Surgical History:  Procedure Laterality Date   cataract surgery     EYE SURGERY     JOINT REPLACEMENT Bilateral    Dr. Trudee GripFrank Alusio   kidney stone surgey     lipoma removal      reports that she quit smoking about 73 years ago. She started smoking about 74 years ago. She quit smokeless tobacco use about 73 years ago. She reports that she does not drink alcohol or use drugs. Social History   Socioeconomic History   Marital status: Widowed    Spouse name: Not on file   Number of children: 3   Years of education: 1 yr college   Highest education level: Not on file  Occupational History   Occupation: Retired Research scientist (physical sciences)teacher  Social Needs   Financial resource strain: Not hard at all  Food insecurity    Worry: Never true    Inability: Never true   Transportation needs    Medical: No    Non-medical: No  Tobacco Use   Smoking status: Former Smoker    Start date: 08/07/1944    Quit date: 10/13/1945    Years since quitting: 73.4   Smokeless tobacco: Former Systems developer    Quit date: 10/01/1945  Substance and Sexual Activity   Alcohol use: No    Alcohol/week: 6.0 - 7.0 standard drinks    Types: 6 - 7 Standard drinks or equivalent per week   Drug use: No   Sexual activity: Not on file  Lifestyle   Physical activity    Days per week: 0 days    Minutes per  session: 0 min   Stress: Not at all  Relationships   Social connections    Talks on phone: More than three times a week    Gets together: More than three times a week    Attends religious service: Never    Active member of club or organization: No    Attends meetings of clubs or organizations: Never    Relationship status: Widowed   Intimate partner violence    Fear of current or ex partner: No    Emotionally abused: No    Physically abused: No    Forced sexual activity: No  Other Topics Concern   Not on file  Social History Narrative   Widowed remote hx of tobacco x 4 years   Living at [facility] [place]   Daughters have HCPOA  If needed      Diet: Regular, Healthy Foods      Do you drink/ eat things with caffeine? Coffee in a.m. Approx. 2 cups per day.      Right-handed      Marital status: Married/Widowed                             What year were you married ? Twice, 1st 1948, [place] you live in a house, apartment,assistred living, condo, trailer, etc.)? Apartment      Is it one or more stories? No      How many persons live in your home ? 1      Do you have any pets in your home ?(please list) No      Current or past profession: Corporate treasurer       Do you exercise? Yes                             Type & how often: Walk daily around facility.      Do you have a living will? Yes      Do you have a DNR form? Yes                      If not, do you want to discuss one? Yes      Do you have signed POA?HPOA forms?  Yes               If so, please bring to your        appointment          Functional Status Survey:    Family History  Problem Relation Age of Onset   Melanoma Mother  101   Breast cancer Mother        [address]   Stroke Father        7090   Arthritis Father        5075   Diabetes Father 1478       borderline diabetic   Memory loss Sister    Osteoporosis Other     Health Maintenance  Topic Date Due    INFLUENZA VACCINE  03/29/2019   TETANUS/TDAP  04/17/2021   DEXA SCAN  Completed   PNA vac Low Risk Adult  Completed    Allergies  Allergen Reactions   Donepezil Other (See Comments)    BAD DREAMS   Namenda [Memantine]    Remeron [Mirtazapine] Other (See Comments)    Dizzy     Outpatient Encounter Medications as of 03/20/2019  Medication Sig   acetaminophen (TYLENOL) 500 MG tablet Take 2 tablets (1,000 mg total) by mouth every 8 (eight) hours as needed.   atenolol (TENORMIN) 25 MG tablet Take 25 mg by mouth daily.   calcium carbonate (OSCAL) 1500 (600 Ca) MG TABS tablet Take 600 mg of elemental calcium by mouth 2 (two) times daily with a meal.   carboxymethylcellulose (REFRESH PLUS) 0.5 % SOLN Place 1 drop into both eyes 4 (four) times daily.    Carboxymethylcellulose Sodium (THERATEARS OP) Apply to eye 2 (two) times a day. 1 pad both eyes 2 times daily   cholecalciferol (VITAMIN D) 1000 units tablet Take 1,000 Units by mouth daily.   hydrocortisone cream 1 % Apply 1 application topically as needed. Apply to the chest    Omega-3 Fatty Acids (FISH OIL) 1200 MG CAPS Take 1 capsule by mouth daily.    predniSONE (DELTASONE) 1 MG tablet Take 3 mg by mouth daily.    sertraline (ZOLOFT) 100 MG tablet Take 100 mg by mouth daily. Take with 25 mg = 125 mg.   sertraline (ZOLOFT) 25 MG tablet Take 25 mg by mouth daily. 100 mg +25 mg= 125 mg   therapeutic multivitamin-minerals (THERAGRAN-M) tablet Take 1 tablet by mouth daily.   UNABLE TO FIND Med Name: 120 SAL AC 30% in Petrolatum cream. Apply to right and left cheek dark spots at bedtime.   [DISCONTINUED] Eyelid Cleansers (AVENOVA EX) Apply 1 application topically 2 (two) times daily.   [DISCONTINUED] Multiple Vitamin (MULTIVITAMIN WITH MINERALS) TABS tablet Take 1 tablet by mouth daily.   No facility-administered encounter medications on file as of 03/20/2019.     Review of Systems  Review of Systems  Constitutional:  Negative for activity change, appetite change, chills, diaphoresis, fatigue and fever.  HENT: Negative for mouth sores, postnasal drip, rhinorrhea, sinus pain and sore throat.   Respiratory: Negative for apnea, cough, chest tightness, shortness of breath and wheezing.   Cardiovascular: Negative for chest pain, palpitations and leg swelling.  Gastrointestinal: Negative for abdominal distention, abdominal pain, constipation, diarrhea, nausea and vomiting.  Genitourinary: Negative for dysuria and frequency.  Musculoskeletal: Negative for arthralgias, joint swelling and myalgias.  Skin: Negative for rash.  Neurological: Negative for dizziness, syncope, weakness, light-headedness and numbness.  Psychiatric/Behavioral: Negative for behavioral problems, confusion and sleep disturbance.     Vitals:   03/20/19 1058  BP: 138/74  Pulse: 96  Resp: 20  Temp: (!) 97 F (36.1 C)  SpO2: 95%  Weight: 176 lb 11.2 oz (80.2 kg)  Height: 5\' 6"  (1.676 m)   Body mass index is 28.52 kg/m. Physical Exam Vitals signs reviewed.  Constitutional:  Appearance: Normal appearance.  HENT:     Head: Normocephalic.     Nose: Nose normal.     Mouth/Throat:     Mouth: Mucous membranes are moist.     Pharynx: Oropharynx is clear.  Eyes:     Comments: Has redness in both her eyes  Neck:     Musculoskeletal: Neck supple.  Cardiovascular:     Rate and Rhythm: Normal rate and regular rhythm.     Pulses: Normal pulses.     Heart sounds: Normal heart sounds.  Pulmonary:     Effort: Pulmonary effort is normal.     Breath sounds: Normal breath sounds.  Abdominal:     General: Abdomen is flat. Bowel sounds are normal.     Palpations: Abdomen is soft.  Musculoskeletal:     Comments: Mild Swelling Bilateral  Skin:    General: Skin is warm and dry.  Neurological:     General: No focal deficit present.     Mental Status: She is alert.     Comments: Seemed more confused today Does have aphasia    Psychiatric:        Mood and Affect: Mood normal.        Thought Content: Thought content normal.     Labs reviewed: Basic Metabolic Panel: Recent Labs    04/09/18 11/21/18 12/10/18  NA 139 137 141  K 3.9 4.4 3.7  CL 106 102  --   CO2 28 22  --   BUN 18 27* 24*  CREATININE 0.6 0.8 0.6  CALCIUM 9.2 10.8  --    Liver Function Tests: Recent Labs    04/09/18 11/21/18 12/10/18  AST ALT ALKPHOS 67 74 65  PROT 5.6 7.0  --   ALBUMIN 3.7 4.6  --    No results for input(s): LIPASE, AMYLASE in the last 8760 hours. No results for input(s): AMMONIA in the last 8760 hours. CBC: Recent Labs    04/09/18 11/21/18 12/10/18  WBC 7.7 11.9 7.8  HGB 13.7 15.4 13.7  HCT 41 45 41  PLT 218 299 220   Cardiac Enzymes: No results for input(s): CKTOTAL, CKMB, CKMBINDEX, TROPONINI in the last 8760 hours. BNP: Invalid input(s): POCBNP Lab Results  Component Value Date   HGBA1C 5.3 04/26/2017   Lab Results  Component Value Date   TSH 2.77 04/09/2018   Lab Results  Component Value Date   VITAMINB12 827 03/20/2016   Lab Results  Component Value Date   FOLATE >20.0 ng/mL 03/08/2010   Lab Results  Component Value Date   FERRITIN 166.2 10/08/2006    Imaging and Procedures obtained prior to SNF admission: Ct Head Wo Contrast  Result Date: 01/24/2018 CLINICAL DATA:  Headache 1 week.  Dementia. EXAM: CT HEAD WITHOUT CONTRAST TECHNIQUE: Contiguous axial images were obtained from the base of the skull through the vertex without intravenous contrast. COMPARISON:  MRI head 01/07/2009 FINDINGS: Brain: Cerebral atrophy most prominent in the frontal and temporal lobes. Mild progression since 2010. Negative for hydrocephalus. Negative for acute infarct.  Negative for hemorrhage or mass. 7.7 mm cyst in the posterior sella compatible with pituitary cyst. This was present previously but has enlarged. Vascular: Negative for hyperdense vessel Skull: No skull lesion. Sinuses/Orbits:  Extensive opacification of the sphenoid sinus with mild bony thickening. Question chronic sinusitis versus prior trans-sphenoidal pituitary surgery. Review of the prior MRI demonstrates T1 hyperintensity within the sphenoid sinus which could be due to  fat from prior surgery versus hyperintense secretions. Other: None IMPRESSION: Frontotemporal atrophy.  No acute abnormality. 7.7 mm cyst posterior sella likely a benign pituitary cyst or Rathke's cleft cyst. This has progressed in size since 2010 Extensive mucosal disease in the sphenoid sinus with bony thickening likely related to chronic sinusitis. Question history of prior trans-sphenoidal pituitary surgery. Electronically Signed   By: Marlan Palauharles  Clark M.D.   On: 01/24/2018 13:11    Assessment/Plan Essential hypertension, benign - Plan:  Blood pressure controlled on Tenormin  Acute conjunctivitis of both eyes,- Plan:  Start her on Ocuflox 4 times daily for 10 days  Chronic venous insufficiency - Plan:  Continue to follow  Late onset Alzheimer's disease without behavioral disturbance  - Plan:  Patient has not tolerated Aricept and Namenda in the past Has severe aphasia Now she is on a high level of care Will continue supportive care Polymyalgia rheumatica - Plan:  On Low dose of prednisone -DEXA scan in 1018 showed T score -1   depression with anxiety - Plan:  Patient having still difficult time adjusting to the new room Continue Zoloft No GDR recommended    Family/ staff Communication:   Labs/tests ordered:  Total time spent in this patient care encounter was  35_  minutes; greater than 50% of the visit spent counseling patient and staff, reviewing records , Labs and coordinating care for problems addressed at this encounter.

## 2019-03-22 DIAGNOSIS — H1033 Unspecified acute conjunctivitis, bilateral: Secondary | ICD-10-CM | POA: Insufficient documentation

## 2019-03-25 LAB — CBC AND DIFFERENTIAL
HCT: 46 (ref 36–46)
Hemoglobin: 15.4 (ref 12.0–16.0)
Platelets: 287 (ref 150–399)
WBC: 10.9

## 2019-03-25 LAB — HEPATIC FUNCTION PANEL
ALT: 16 (ref 7–35)
AST: 22 (ref 13–35)
Alkaline Phosphatase: 69 (ref 25–125)
Bilirubin, Total: 0.7

## 2019-03-25 LAB — BASIC METABOLIC PANEL
BUN: 16 (ref 4–21)
Creatinine: 0.7 (ref 0.5–1.1)
Glucose: 104
Potassium: 4.2 (ref 3.4–5.3)
Sodium: 139 (ref 137–147)

## 2019-04-16 ENCOUNTER — Non-Acute Institutional Stay (SKILLED_NURSING_FACILITY): Payer: Medicare Other | Admitting: Nurse Practitioner

## 2019-04-16 ENCOUNTER — Other Ambulatory Visit: Payer: Self-pay | Admitting: *Deleted

## 2019-04-16 ENCOUNTER — Encounter: Payer: Self-pay | Admitting: Nurse Practitioner

## 2019-04-16 DIAGNOSIS — F418 Other specified anxiety disorders: Secondary | ICD-10-CM

## 2019-04-16 DIAGNOSIS — I10 Essential (primary) hypertension: Secondary | ICD-10-CM

## 2019-04-16 DIAGNOSIS — M353 Polymyalgia rheumatica: Secondary | ICD-10-CM

## 2019-04-16 LAB — COMPLETE METABOLIC PANEL WITH GFR
Albumin: 3.9
Calcium: 10
Carbon Dioxide, Total: 28
Chloride: 102
EGFR (Non-African Amer.): 76
Globulin: 2.6
Total Protein: 6.5 g/dL

## 2019-04-16 NOTE — Progress Notes (Signed)
Location:  [place] Room Number: 5 Place of Service:  SNF (31) Provider:  Marlana Latus  NP  Samyuktha Brau X, NP  Patient Care Team: Consuela Widener X, NP as PCP - General (Internal Medicine) Hennie Duos, MD (Rheumatology) Clent Jacks, MD (Ophthalmology) Allyn Kenner, MD (Dermatology) Ardis Hughs, MD as Attending Physician (Urology) Eliyahu Bille X, NP as Nurse Practitioner (Internal Medicine)  Extended Emergency Contact Information Primary Emergency Contact: McClanahan,Susan Address: [place], [place] [address] Johnnette Litter of [place] Phone: [phone] Mobile Phone: [phone] Relation: Daughter Secondary Emergency Contact: Newman Nip [place] of [place] Phone: [phone] Relation: Daughter  Code Status:  DNR Goals of care: Advanced Directive information Advanced Directives 04/16/2019  Does Patient Have a Medical Advance Directive? Yes  Type of Advance Directive Out of facility DNR (pink MOST or yellow form)  Does patient want to make changes to medical advance directive? No - Patient declined  Copy of [place] in Chart? -  Would patient like information on creating a medical advance directive? -  Pre-existing out of facility DNR order (yellow form or pink MOST form) Yellow form placed in chart (order not valid for inpatient use)     Chief Complaint  Patient presents with  . Medical Management of Chronic Issues    HPI:  Pt is a 85 y.o. female seen today for medical management of chronic diseases.     The patient resides in SNF [facility] for safety and care assistance, ambulates with walker. Her mood is stable, taking Sertraline 125mg  qd. HTN, blood pressure is controled on Atenolol 25mg  qd. Polymyalgia rheumatica, stable, on Prednisone 3mg  qd, prn Tylenol 1000mg  q8h prn.   Past Medical History:  Diagnosis Date  . Acute bronchitis 06/27/2015  . Alzheimer's dementia  ([place])   . Cataract   . Cellulitis of left leg   . Depression   . Eczema   . High cholesterol   . History of renal stone    excision  . Hypertension   . Mild cognitive impairment   . Osteoarthritis   . Polymyalgia rheumatica (HCC)    rx with prednisone  . Urinary incontinence   . Varicose veins   . Xerosis of skin    Past Surgical History:  Procedure Laterality Date  . cataract surgery    . EYE SURGERY    . JOINT REPLACEMENT Bilateral    Dr. Hector Shade  . kidney stone surgey    . lipoma removal      Allergies  Allergen Reactions  . Donepezil Other (See Comments)    BAD DREAMS  . Namenda [Memantine]   . Remeron [Mirtazapine] Other (See Comments)    Dizzy     Outpatient Encounter Medications as of 04/16/2019  Medication Sig  . acetaminophen (TYLENOL) 500 MG tablet Take 2 tablets (1,000 mg total) by mouth every 8 (eight) hours as needed.  Marland Kitchen atenolol (TENORMIN) 25 MG tablet Take 25 mg by mouth daily.  . calcium carbonate (OSCAL) 1500 (600 Ca) MG TABS tablet Take 600 mg of elemental calcium by mouth 2 (two) times daily with a meal.  . Carboxymethylcellulose Sodium (THERATEARS OP) Apply 1 drop to eye 4 (four) times daily. Both eyes  . Carboxymethylcellulose Sodium (THERATEARS OP) Apply to eye. 1 PAD BOTH EYES; CLEAN THE LIDS 2 TIMES A DAY  . cholecalciferol (VITAMIN D) 1000 units tablet Take 1,000 Units  by mouth daily.  . hydrocortisone cream 1 % Apply 1 application topically as needed. Apply to the chest   . Omega-3 Fatty Acids (FISH OIL) 1200 MG CAPS Take 1 capsule by mouth daily.   . predniSONE (DELTASONE) 1 MG tablet Take 3 mg by mouth daily.   . sertraline (ZOLOFT) 100 MG tablet Take 100 mg by mouth daily. Take with 25 mg = 125 mg.  . sertraline (ZOLOFT) 25 MG tablet Take 25 mg by mouth daily. 100 mg +25 mg= 125 mg  . therapeutic multivitamin-minerals (THERAGRAN-M) tablet Take 1 tablet by mouth daily.  Marland Kitchen. UNABLE TO FIND Med Name: 120 SAL AC 30% in Petrolatum cream.  Apply to right and left cheek dark spots at bedtime.  . [DISCONTINUED] carboxymethylcellulose (REFRESH PLUS) 0.5 % SOLN Place 1 drop into both eyes 4 (four) times daily.    No facility-administered encounter medications on file as of 04/16/2019.    ROS was provided with assistance of staff Review of Systems  Constitutional: Negative for activity change, appetite change, chills, diaphoresis, fatigue, fever and unexpected weight change.  HENT: Positive for hearing loss. Negative for congestion and voice change.   Respiratory: Negative for cough, shortness of breath and wheezing.   Cardiovascular: Negative for chest pain, palpitations and leg swelling.  Gastrointestinal: Negative for abdominal distention, abdominal pain, constipation, diarrhea, nausea and vomiting.  Genitourinary: Negative for difficulty urinating, dysuria and urgency.  Musculoskeletal: Positive for arthralgias and gait problem.  Skin: Negative for color change and pallor.  Neurological: Negative for dizziness, speech difficulty, weakness and headaches.       Dementia  Psychiatric/Behavioral: Negative for agitation, behavioral problems, hallucinations and sleep disturbance. The patient is not nervous/anxious.     Immunization History  Administered Date(s) Administered  . Influenza Whole 05/28/2012, 06/01/2018  . Influenza, High Dose Seasonal PF 06/08/2016  . Influenza-Unspecified 05/28/2014, 08/28/2014, 05/27/2015, 06/18/2017  . Pneumococcal Conjugate-13 01/21/2014  . Pneumococcal Polysaccharide-23 04/16/2015  . Td 09/29/2003  . Zoster 01/14/2011   Pertinent  Health Maintenance Due  Topic Date Due  . INFLUENZA VACCINE  03/29/2019  . DEXA SCAN  Completed  . PNA vac Low Risk Adult  Completed   Fall Risk  05/17/2018 05/10/2017 05/01/2017 07/17/2016 06/09/2015  Falls in the past year? No No No Yes No  Comment - - - tripped over the last stair in the house -  Number falls in past yr: - - - 1 -   Functional Status Survey:     Vitals:   04/16/19 0910  BP: 118/68  Pulse: 69  Resp: 16  Temp: (!) 97.1 F (36.2 C)  SpO2: 95%  Weight: 177 lb 1.6 oz (80.3 kg)  Height: 5\' 6"  (1.676 m)   Body mass index is 28.58 kg/m. Physical Exam Vitals signs and nursing note reviewed.  Constitutional:      General: She is not in acute distress.    Appearance: Normal appearance. She is not ill-appearing, toxic-appearing or diaphoretic.     Comments: Over weight  HENT:     Head: Normocephalic and atraumatic.     Nose: Nose normal.     Mouth/Throat:     Mouth: Mucous membranes are moist.  Eyes:     Extraocular Movements: Extraocular movements intact.     Conjunctiva/sclera: Conjunctivae normal.     Pupils: Pupils are equal, round, and reactive to light.  Cardiovascular:     Rate and Rhythm: Normal rate and regular rhythm.     Heart sounds: No murmur.  Pulmonary:     Breath sounds: No wheezing or rales.  Abdominal:     General: Bowel sounds are normal. There is no distension.     Palpations: Abdomen is soft.     Tenderness: There is no abdominal tenderness. There is no right CVA tenderness, left CVA tenderness, guarding or rebound.  Musculoskeletal:     Right lower leg: Edema present.     Left lower leg: Edema present.     Comments: Trace edema BLE, ambulates with walker  Skin:    General: Skin is warm and dry.  Neurological:     General: No focal deficit present.     Mental Status: She is alert. Mental status is at baseline.     Comments: Oriented to self.  Psychiatric:        Mood and Affect: Mood normal.        Behavior: Behavior normal.     Labs reviewed: Recent Labs    11/21/18 12/10/18 03/25/19  NA 137 141 139  K 4.4 3.7 4.2  CL 102  --  102  CO2 22  --  28  BUN 27* 24* 16  CREATININE 0.8 0.6 0.7  CALCIUM 10.8  --  10.0   Recent Labs    11/21/18 12/10/18 03/25/19  AST 30 17 22   ALT 23 15 16   ALKPHOS 74 65 69  PROT 7.0  --  6.5  ALBUMIN 4.6  --  3.9   Recent Labs    11/21/18  12/10/18 03/25/19  WBC 11.9 7.8 10.9  HGB 15.4 13.7 15.4  HCT 45 41 46  PLT 299 220 287   Lab Results  Component Value Date   TSH 2.77 04/09/2018   Lab Results  Component Value Date   HGBA1C 5.3 04/26/2017   Lab Results  Component Value Date   CHOL 217 (A) 04/09/2018   HDL 51 04/09/2018   LDLCALC 147 04/09/2018   LDLDIRECT 173.0 08/07/2012   TRIG 86 04/09/2018   CHOLHDL 5 03/20/2016    Significant Diagnostic Results in last 30 days:  No results found.  Assessment/Plan Essential hypertension, benign Blood pressure is controlled, continue Atenolol 25mg  qd.   Depression with anxiety Her mood is stable, continue Sertraline 125mg  qd  Polymyalgia rheumatica (HCC) stable, continue  Prednisone 3mg  qd, prn Tylenol 1000mg  q8h prn.      Family/ staff Communication: plan of care reviewed with the patient and charge nurse.   Labs/tests ordered:  none  Time spend 25 minutes.

## 2019-04-16 NOTE — Assessment & Plan Note (Signed)
stable, continue  Prednisone 3mg  qd, prn Tylenol 1000mg  q8h prn.

## 2019-04-16 NOTE — Assessment & Plan Note (Signed)
Her mood is stable, continue Sertraline 125mg  qd

## 2019-04-16 NOTE — Assessment & Plan Note (Signed)
Blood pressure is controlled, continue Atenolol 25mg qd.  

## 2019-05-06 ENCOUNTER — Non-Acute Institutional Stay (SKILLED_NURSING_FACILITY): Payer: Medicare Other | Admitting: Nurse Practitioner

## 2019-05-06 ENCOUNTER — Encounter: Payer: Self-pay | Admitting: Nurse Practitioner

## 2019-05-06 DIAGNOSIS — F0280 Dementia in other diseases classified elsewhere without behavioral disturbance: Secondary | ICD-10-CM

## 2019-05-06 DIAGNOSIS — I10 Essential (primary) hypertension: Secondary | ICD-10-CM

## 2019-05-06 DIAGNOSIS — G301 Alzheimer's disease with late onset: Secondary | ICD-10-CM | POA: Diagnosis not present

## 2019-05-06 DIAGNOSIS — F418 Other specified anxiety disorders: Secondary | ICD-10-CM | POA: Diagnosis not present

## 2019-05-06 NOTE — Progress Notes (Signed)
Location:  [place] Room Number: 5 Place of Service:  SNF (31) Provider:  Marlana Latus NP  Merari Pion X, NP  Patient Care Team: Anaiyah Anglemyer X, NP as PCP - General (Internal Medicine) Hennie Duos, MD (Rheumatology) Clent Jacks, MD (Ophthalmology) Allyn Kenner, MD (Dermatology) Ardis Hughs, MD as Attending Physician (Urology) Emelda Kohlbeck X, NP as Nurse Practitioner (Internal Medicine)  Extended Emergency Contact Information Primary Emergency Contact: McClanahan,Susan Address: [place], [place] [address] Johnnette Litter of [place] Phone: [phone] Mobile Phone: [phone] Relation: Daughter Secondary Emergency Contact: Newman Nip States of [place] Phone: [phone] Relation: Daughter  Code Status:  DNR Goals of care: Advanced Directive information Advanced Directives 05/06/2019  Does Patient Have a Medical Advance Directive? Yes  Type of Advance Directive Out of facility DNR (pink MOST or yellow form)  Does patient want to make changes to medical advance directive? No - Patient declined  Copy of [place] in Chart? -  Would patient like information on creating a medical advance directive? -  Pre-existing out of facility DNR order (yellow form or pink MOST form) Yellow form placed in chart (order not valid for inpatient use)     Chief Complaint  Patient presents with  . Acute Visit    C/o - Hallucinations    HPI:  Pt is a 85 y.o. female seen today for an acute visit for reported "hallucinations, talking to the walls, walker, and other furniture in her room, exiting seeking. Hx of dementia, resides in SNF [facility] for safety, care assistance. Hx of depression/anxiety, on Sertraline 141m qd, HTN, blood pressure is controlled on Atenolol 275mqd.    Past Medical History:  Diagnosis Date  . Acute bronchitis 06/27/2015  . Alzheimer's dementia ([place]  . Cataract   .  Cellulitis of left leg   . Depression   . Eczema   . High cholesterol   . History of renal stone    excision  . Hypertension   . Mild cognitive impairment   . Osteoarthritis   . Polymyalgia rheumatica (HCC)    rx with prednisone  . Urinary incontinence   . Varicose veins   . Xerosis of skin    Past Surgical History:  Procedure Laterality Date  . cataract surgery    . EYE SURGERY    . JOINT REPLACEMENT Bilateral    Dr. FrHector Shade. kidney stone surgey    . lipoma removal      Allergies  Allergen Reactions  . Donepezil Other (See Comments)    BAD DREAMS  . Namenda [Memantine]   . Remeron [Mirtazapine] Other (See Comments)    Dizzy     Outpatient Encounter Medications as of 05/06/2019  Medication Sig  . acetaminophen (TYLENOL) 500 MG tablet Take 2 tablets (1,000 mg total) by mouth every 8 (eight) hours as needed.  . Marland Kitchentenolol (TENORMIN) 25 MG tablet Take 25 mg by mouth daily.  . calcium carbonate (OSCAL) 1500 (600 Ca) MG TABS tablet Take 600 mg of elemental calcium by mouth 2 (two) times daily with a meal.  . Carboxymethylcellulose Sodium (THERATEARS OP) Apply 1 drop to eye 4 (four) times daily. Both eyes  . cholecalciferol (VITAMIN D) 1000 units tablet Take 1,000 Units by mouth daily.  . Eyelid Cleansers (OCUSOFT BABY EYELID & EYELASH EX) Apply topically. 1 PAD BOTH EYES; CLEAN THE LIDS 2  TIMES A DAY  . hydrocortisone cream 1 % Apply 1 application topically as needed. Apply to the chest   . Omega-3 Fatty Acids (FISH OIL) 1200 MG CAPS Take 1 capsule by mouth daily.   . predniSONE (DELTASONE) 1 MG tablet Take 3 mg by mouth daily.   . QUEtiapine (SEROQUEL) 12.5 mg TABS tablet Take 12.5 mg by mouth daily as needed.  . sertraline (ZOLOFT) 100 MG tablet Take 100 mg by mouth daily. Take with 25 mg = 125 mg.  . sertraline (ZOLOFT) 25 MG tablet Take 25 mg by mouth daily. 100 mg +25 mg= 125 mg  . therapeutic multivitamin-minerals (THERAGRAN-M) tablet Take 1 tablet by mouth daily.   Marland Kitchen UNABLE TO FIND Med Name: 120 SAL AC 30% in Petrolatum cream. Apply to right and left cheek dark spots at bedtime.  . [DISCONTINUED] Carboxymethylcellulose Sodium (THERATEARS OP) Apply to eye. 1 PAD BOTH EYES; CLEAN THE LIDS 2 TIMES A DAY   No facility-administered encounter medications on file as of 05/06/2019.    ROS was provided with assistance of stff.  Review of Systems  Constitutional: Negative for activity change, appetite change, chills, diaphoresis, fatigue and fever.  HENT: Positive for hearing loss. Negative for congestion and voice change.   Respiratory: Negative for cough, shortness of breath and wheezing.   Cardiovascular: Positive for leg swelling. Negative for chest pain and palpitations.  Gastrointestinal: Negative for abdominal distention, abdominal pain, constipation, diarrhea, nausea and vomiting.  Genitourinary: Negative for difficulty urinating, dysuria and urgency.  Musculoskeletal: Positive for arthralgias and gait problem.  Skin: Negative for color change and pallor.  Neurological: Negative for dizziness, facial asymmetry, speech difficulty, weakness and headaches.       Dementia  Psychiatric/Behavioral: Positive for agitation, behavioral problems, confusion and hallucinations. Negative for sleep disturbance. The patient is nervous/anxious.     Immunization History  Administered Date(s) Administered  . Influenza Whole 05/28/2012, 06/01/2018  . Influenza, High Dose Seasonal PF 06/08/2016  . Influenza-Unspecified 05/28/2014, 08/28/2014, 05/27/2015, 06/18/2017  . Pneumococcal Conjugate-13 01/21/2014  . Pneumococcal Polysaccharide-23 04/16/2015  . Td 09/29/2003  . Zoster 01/14/2011   Pertinent  Health Maintenance Due  Topic Date Due  . INFLUENZA VACCINE  03/29/2019  . DEXA SCAN  Completed  . PNA vac Low Risk Adult  Completed   Fall Risk  05/17/2018 05/10/2017 05/01/2017 07/17/2016 06/09/2015  Falls in the past year? No No No Yes No  Comment - - - tripped over  the last stair in the house -  Number falls in past yr: - - - 1 -   Functional Status Survey:    Vitals:   05/06/19 1510  BP: 124/64  Pulse: 72  Resp: 18  Temp: 98.1 F (36.7 C)  SpO2: 97%  Weight: 177 lb 1.6 oz (80.3 kg)  Height: 5' 6"  (1.676 m)   Body mass index is 28.58 kg/m. Physical Exam Vitals signs and nursing note reviewed.  Constitutional:      General: She is not in acute distress.    Appearance: Normal appearance. She is not ill-appearing, toxic-appearing or diaphoretic.  HENT:     Head: Normocephalic and atraumatic.     Nose: Nose normal.     Mouth/Throat:     Mouth: Mucous membranes are moist.  Eyes:     Extraocular Movements: Extraocular movements intact.     Conjunctiva/sclera: Conjunctivae normal.     Pupils: Pupils are equal, round, and reactive to light.  Neck:     Musculoskeletal: Normal range  of motion and neck supple.  Cardiovascular:     Rate and Rhythm: Normal rate and regular rhythm.     Heart sounds: No murmur.  Pulmonary:     Breath sounds: No wheezing, rhonchi or rales.  Abdominal:     General: Bowel sounds are normal.     Palpations: Abdomen is soft.     Tenderness: There is no abdominal tenderness. There is no right CVA tenderness, left CVA tenderness, guarding or rebound.  Musculoskeletal:     Right lower leg: Edema present.     Left lower leg: Edema present.     Comments: Trace edema BLE. Ambulates with walker.   Skin:    General: Skin is warm and dry.  Neurological:     General: No focal deficit present.     Mental Status: She is alert. Mental status is at baseline.     Motor: No weakness.     Coordination: Coordination normal.     Gait: Gait abnormal.     Comments: Oriented to self.   Psychiatric:     Comments: Resistance to questions     Labs reviewed: Recent Labs    11/21/18 12/10/18 03/25/19  NA 137 141 139  K 4.4 3.7 4.2  CL 102  --  102  CO2 22  --  28  BUN 27* 24* 16  CREATININE 0.8 0.6 0.7  CALCIUM 10.8  --   10.0   Recent Labs    11/21/18 12/10/18 03/25/19  AST 30 17 22   ALT 23 15 16   ALKPHOS 74 65 69  PROT 7.0  --  6.5  ALBUMIN 4.6  --  3.9   Recent Labs    11/21/18 12/10/18 03/25/19  WBC 11.9 7.8 10.9  HGB 15.4 13.7 15.4  HCT 45 41 46  PLT 299 220 287   Lab Results  Component Value Date   TSH 2.77 04/09/2018   Lab Results  Component Value Date   HGBA1C 5.3 04/26/2017   Lab Results  Component Value Date   CHOL 217 (A) 04/09/2018   HDL 51 04/09/2018   LDLCALC 147 04/09/2018   LDLDIRECT 173.0 08/07/2012   TRIG 86 04/09/2018   CHOLHDL 5 03/20/2016    Significant Diagnostic Results in last 30 days:  No results found.  Assessment/Plan Late onset Alzheimer's disease without behavioral disturbance (HCC) Hallucination vs dementia, will have Seroquel 12.23m prn available to her. Update CBC/diff, CMP/eGFR.   Essential hypertension, benign Blood pressure is controlled, continue Atenolol 241mqd.   Depression with anxiety Continue Sertraline 12528md, adding Seroquel 12.5mg21mn for ?hallucination vs progression of dementia, observe.      Family/ staff Communication: plan of care reviewed with the patient and charge nurse.   Labs/tests ordered:  CBC/diff, CMP/eGFR  Time spend 25 minutes.

## 2019-05-08 LAB — BASIC METABOLIC PANEL
BUN: 16 (ref 4–21)
Creatinine: 0.7 (ref 0.5–1.1)
Glucose: 79
Potassium: 3.7 (ref 3.4–5.3)
Sodium: 143 (ref 137–147)

## 2019-05-08 LAB — HEPATIC FUNCTION PANEL
ALT: 15 (ref 7–35)
AST: 18 (ref 13–35)
Alkaline Phosphatase: 76 (ref 25–125)
Bilirubin, Total: 0.4

## 2019-05-08 LAB — CBC AND DIFFERENTIAL
HCT: 41 (ref 36–46)
Hemoglobin: 13.6 (ref 12.0–16.0)
Platelets: 235 (ref 150–399)
WBC: 6.8

## 2019-05-09 ENCOUNTER — Encounter: Payer: Self-pay | Admitting: Nurse Practitioner

## 2019-05-09 NOTE — Assessment & Plan Note (Signed)
Continue Sertraline 125mg  qd, adding Seroquel 12.5mg  prn for ?hallucination vs progression of dementia, observe.

## 2019-05-09 NOTE — Assessment & Plan Note (Signed)
Hallucination vs dementia, will have Seroquel 12.76m prn available to her. Update CBC/diff, CMP/eGFR.

## 2019-05-09 NOTE — Assessment & Plan Note (Signed)
Blood pressure is controlled, continue Atenolol 25mg qd.  

## 2019-05-12 ENCOUNTER — Encounter: Payer: Self-pay | Admitting: Nurse Practitioner

## 2019-05-14 ENCOUNTER — Non-Acute Institutional Stay (SKILLED_NURSING_FACILITY): Payer: Medicare Other | Admitting: Nurse Practitioner

## 2019-05-14 ENCOUNTER — Encounter: Payer: Self-pay | Admitting: Nurse Practitioner

## 2019-05-14 DIAGNOSIS — G301 Alzheimer's disease with late onset: Secondary | ICD-10-CM

## 2019-05-14 DIAGNOSIS — F418 Other specified anxiety disorders: Secondary | ICD-10-CM

## 2019-05-14 DIAGNOSIS — M353 Polymyalgia rheumatica: Secondary | ICD-10-CM | POA: Diagnosis not present

## 2019-05-14 DIAGNOSIS — I10 Essential (primary) hypertension: Secondary | ICD-10-CM

## 2019-05-14 DIAGNOSIS — F0280 Dementia in other diseases classified elsewhere without behavioral disturbance: Secondary | ICD-10-CM

## 2019-05-14 NOTE — Assessment & Plan Note (Signed)
Blood pressure is controlled, continue Atenolol 25mg qd.  

## 2019-05-14 NOTE — Assessment & Plan Note (Addendum)
Stable, continue Prednisone 3mg  qd, prn Tylenol 1000mg  q8hr.

## 2019-05-14 NOTE — Assessment & Plan Note (Signed)
Her mood is managed, continue Sertraline 125mg  qd, prn Seroquel 12.5mg  daily.

## 2019-05-14 NOTE — Progress Notes (Signed)
Location:  [facility] Room Number: 5 Place of Service:  SNF (31) Provider:  Chipper Oman  NP  Chai Verdejo X, NP  Patient Care Team: Emmaly Leech X, NP as PCP - General (Internal Medicine) Donnetta Hail, MD (Rheumatology) Ernesto Rutherford, MD (Ophthalmology) Nita Sells, MD (Dermatology) Crist Fat, MD as Attending Physician (Urology) Baden Betsch X, NP as Nurse Practitioner (Internal Medicine)  Extended Emergency Contact Information Primary Emergency Contact: McClanahan,Susan Address: [address] [place], [place] [address] [place] Home Phone: [phone] Mobile Phone: [phone] Relation: Daughter Secondary Emergency Contact: Gardiner Sleeper [place] Home Phone: [phone] Relation: Daughter  Code Status:  DNR Goals of care: Advanced Directive information Advanced Directives 05/14/2019  Does Patient Have a Medical Advance Directive? Yes  Type of Advance Directive Out of facility DNR (pink MOST or yellow form)  Does patient want to make changes to medical advance directive? No - Patient declined  Copy of Healthcare Power of Attorney in Chart? -  Would patient like information on creating a medical advance directive? -  Pre-existing out of facility DNR order (yellow form or pink MOST form) Yellow form placed in chart (order not valid for inpatient use)     Chief Complaint  Patient presents with  . Medical Management of Chronic Issues    HPI:  Pt is a 85 y.o. female seen today for medical management of chronic diseases.    The patient resides in SNF [facility] for safety, care assistance. Her mood is managed on Sertraline 125mg  qd, prn Seroquel 12.5mg  daily. HTN, blood pressure is controlled, on Atenolol 25mg  qd. Polymyalgia rheumatica, stable, on Prednisone 3mg  qd, prn Tylenol 1000mg  q8h    Past Medical History:  Diagnosis Date  . Acute bronchitis 06/27/2015  . Alzheimer's dementia (HCC)    . Cataract   . Cellulitis of left leg   . Depression   . Eczema   . High cholesterol   . History of renal stone    excision  . Hypertension   . Mild cognitive impairment   . Osteoarthritis   . Polymyalgia rheumatica (HCC)    rx with prednisone  . Urinary incontinence   . Varicose veins   . Xerosis of skin    Past Surgical History:  Procedure Laterality Date  . cataract surgery    . EYE SURGERY    . JOINT REPLACEMENT Bilateral    Dr. Trudee Grip  . kidney stone surgey    . lipoma removal      Allergies  Allergen Reactions  . Donepezil Other (See Comments)    BAD DREAMS  . Namenda [Memantine]   . Remeron [Mirtazapine] Other (See Comments)    Dizzy     Outpatient Encounter Medications as of 05/14/2019  Medication Sig  . acetaminophen (TYLENOL) 500 MG tablet Take 2 tablets (1,000 mg total) by mouth every 8 (eight) hours as needed.  Marland Kitchen atenolol (TENORMIN) 25 MG tablet Take 25 mg by mouth daily.  . calcium carbonate (OSCAL) 1500 (600 Ca) MG TABS tablet Take 600 mg of elemental calcium by mouth 2 (two) times daily with a meal.  . Carboxymethylcellulose Sodium (THERATEARS OP) Apply 1 drop to eye 4 (four) times daily. Both eyes  . cholecalciferol (VITAMIN D) 1000 units tablet Take 1,000 Units by mouth daily.  . Eyelid Cleansers (OCUSOFT BABY EYELID & EYELASH EX) Apply topically. 1 PAD BOTH EYES; CLEAN THE  LIDS 2 TIMES A DAY  . hydrocortisone cream 1 % Apply 1 application topically 2 (two) times daily as needed. Apply to the chest   . Omega-3 Fatty Acids (FISH OIL) 1200 MG CAPS Take 1 capsule by mouth daily.   . predniSONE (DELTASONE) 1 MG tablet Take 3 mg by mouth daily.   . QUEtiapine (SEROQUEL) 12.5 mg TABS tablet Take 12.5 mg by mouth daily as needed.  . sertraline (ZOLOFT) 100 MG tablet Take 100 mg by mouth daily. Take with 25 mg = 125 mg.  . sertraline (ZOLOFT) 25 MG tablet Take 25 mg by mouth daily. 100 mg +25 mg= 125 mg  . therapeutic multivitamin-minerals (THERAGRAN-M)  tablet Take 1 tablet by mouth daily.  Marland Kitchen. UNABLE TO FIND Med Name: 120 SAL AC 30% in Petrolatum cream. Apply to right and left cheek dark spots at bedtime.   No facility-administered encounter medications on file as of 05/14/2019.    ROS was provided with assistance of staff.  Review of Systems  Constitutional: Negative for activity change, appetite change, chills, diaphoresis, fatigue, fever and unexpected weight change.  HENT: Positive for hearing loss. Negative for congestion and voice change.   Respiratory: Negative for cough, shortness of breath and wheezing.   Cardiovascular: Positive for leg swelling. Negative for chest pain and palpitations.  Gastrointestinal: Negative for abdominal distention, abdominal pain, constipation, diarrhea, nausea and vomiting.  Genitourinary: Negative for difficulty urinating, dysuria and urgency.  Musculoskeletal: Positive for arthralgias and gait problem.  Skin: Negative for color change and pallor.  Neurological: Negative for dizziness, speech difficulty, weakness and headaches.       Dementia  Psychiatric/Behavioral: Positive for agitation, behavioral problems, confusion and dysphoric mood. Negative for hallucinations and sleep disturbance. The patient is not nervous/anxious.        On and off, mostly in pm.     Immunization History  Administered Date(s) Administered  . Influenza Whole 05/28/2012, 06/01/2018  . Influenza, High Dose Seasonal PF 06/08/2016  . Influenza-Unspecified 05/28/2014, 08/28/2014, 05/27/2015, 06/18/2017  . Pneumococcal Conjugate-13 01/21/2014  . Pneumococcal Polysaccharide-23 04/16/2015  . Td 09/29/2003  . Zoster 01/14/2011   Pertinent  Health Maintenance Due  Topic Date Due  . INFLUENZA VACCINE  03/29/2019  . DEXA SCAN  Completed  . PNA vac Low Risk Adult  Completed   Fall Risk  05/17/2018 05/10/2017 05/01/2017 07/17/2016 06/09/2015  Falls in the past year? No No No Yes No  Comment - - - tripped over the last stair in the  house -  Number falls in past yr: - - - 1 -   Functional Status Survey:    Vitals:   05/14/19 0857  BP: 140/84  Pulse: 66  Resp: 18  Temp: 98.4 F (36.9 C)  SpO2: 96%  Weight: 177 lb 1.6 oz (80.3 kg)  Height: 5\' 6"  (1.676 m)   Body mass index is 28.58 kg/m. Physical Exam Vitals signs and nursing note reviewed.  Constitutional:      General: She is not in acute distress.    Appearance: Normal appearance. She is not ill-appearing, toxic-appearing or diaphoretic.     Comments: Over weight  HENT:     Head: Normocephalic and atraumatic.     Nose: Nose normal.     Mouth/Throat:     Mouth: Mucous membranes are moist.  Eyes:     Extraocular Movements: Extraocular movements intact.     Conjunctiva/sclera: Conjunctivae normal.     Pupils: Pupils are equal, round, and reactive to  light.  Neck:     Musculoskeletal: Normal range of motion and neck supple.  Cardiovascular:     Rate and Rhythm: Normal rate and regular rhythm.     Heart sounds: No murmur.  Abdominal:     General: Bowel sounds are normal. There is no distension.     Palpations: Abdomen is soft.     Tenderness: There is no abdominal tenderness. There is no right CVA tenderness, left CVA tenderness, guarding or rebound.  Musculoskeletal:     Right lower leg: Edema present.     Left lower leg: Edema present.     Comments: Trace edema BLE. Ambulates with walker.   Skin:    General: Skin is warm and dry.  Neurological:     General: No focal deficit present.     Mental Status: She is alert. Mental status is at baseline.     Cranial Nerves: No cranial nerve deficit.     Motor: No weakness.     Coordination: Coordination normal.     Gait: Gait abnormal.     Comments: Oriented to self.   Psychiatric:        Mood and Affect: Mood normal.        Behavior: Behavior normal.     Comments: Upon my visit today.      Labs reviewed: Recent Labs    11/21/18 12/10/18 03/25/19  NA 137 141 139  K 4.4 3.7 4.2  CL 102  --   102  CO2 22  --  28  BUN 27* 24* 16  CREATININE 0.8 0.6 0.7  CALCIUM 10.8  --  10.0   Recent Labs    11/21/18 12/10/18 03/25/19  AST 30 17 22   ALT 23 15 16   ALKPHOS 74 65 69  PROT 7.0  --  6.5  ALBUMIN 4.6  --  3.9   Recent Labs    11/21/18 12/10/18 03/25/19  WBC 11.9 7.8 10.9  HGB 15.4 13.7 15.4  HCT 45 41 46  PLT 299 220 287   Lab Results  Component Value Date   TSH 2.77 04/09/2018   Lab Results  Component Value Date   HGBA1C 5.3 04/26/2017   Lab Results  Component Value Date   CHOL 217 (A) 04/09/2018   HDL 51 04/09/2018   LDLCALC 147 04/09/2018   LDLDIRECT 173.0 08/07/2012   TRIG 86 04/09/2018   CHOLHDL 5 03/20/2016    Significant Diagnostic Results in last 30 days:  No results found.  Assessment/Plan Essential hypertension, benign Blood pressure is controlled, continue Atenolol 25mg  qd.   Late onset Alzheimer's disease without behavioral disturbance ([place]) Continue SNF [facility] for safety, care assistance, ambulates with walker.   Polymyalgia rheumatica (HCC) Stable, continue Prednisone 3mg  qd, prn Tylenol 1000mg  q8hr.   Depression with anxiety Her mood is managed, continue Sertraline 125mg  qd, prn Seroquel 12.5mg  daily.      Family/ staff Communication: plan of care reviewed with the patient and charge nurse.   Labs/tests ordered: none  Time spend 25 minutes.

## 2019-05-14 NOTE — Assessment & Plan Note (Signed)
Continue SNF [facility] for safety, care assistance, ambulates with walker . 

## 2019-05-27 ENCOUNTER — Encounter: Payer: Self-pay | Admitting: Internal Medicine

## 2019-05-27 ENCOUNTER — Non-Acute Institutional Stay (SKILLED_NURSING_FACILITY): Payer: Medicare Other | Admitting: Internal Medicine

## 2019-05-27 DIAGNOSIS — F418 Other specified anxiety disorders: Secondary | ICD-10-CM

## 2019-05-27 DIAGNOSIS — I10 Essential (primary) hypertension: Secondary | ICD-10-CM

## 2019-05-27 DIAGNOSIS — M353 Polymyalgia rheumatica: Secondary | ICD-10-CM | POA: Diagnosis not present

## 2019-05-27 DIAGNOSIS — I872 Venous insufficiency (chronic) (peripheral): Secondary | ICD-10-CM

## 2019-05-27 DIAGNOSIS — G301 Alzheimer's disease with late onset: Secondary | ICD-10-CM

## 2019-05-27 DIAGNOSIS — F0280 Dementia in other diseases classified elsewhere without behavioral disturbance: Secondary | ICD-10-CM

## 2019-05-27 LAB — CHLORIDE
Albumin: 3.7
Calcium: 9.5
Carbon Dioxide, Total: 31
Chloride: 105
Globulin: 1.9

## 2019-05-27 LAB — PROTEIN, TOTAL: Total Protein: 5.6 — AB (ref 6.4–8.2)

## 2019-05-27 NOTE — Progress Notes (Signed)
Location:    Nursing Home Room Number: 5 Place of Service:  SNF (31) Provider:  Einar CrowGupta, Obi Scrima MD  Mast, Man X, NP  Patient Care Team: Mast, Man X, NP as PCP - General (Internal Medicine) Donnetta HailBeekman, James F, MD (Rheumatology) Ernesto RutherfordGroat, Robert, MD (Ophthalmology) Nita SellsHall, John, MD (Dermatology) Crist FatHerrick, Benjamin W, MD as Attending Physician (Urology) Mast, Man X, NP as Nurse Practitioner (Internal Medicine)  Extended Emergency Contact Information Primary Emergency Contact: McClanahan,Susan Address: [address]          [place], [place] [address] [place] Home Phone: [phone] Mobile Phone: [phone] Relation: Daughter Secondary Emergency Contact: Gardiner SleeperBaker,Carolanne  [place] Home Phone: [phone] Relation: Daughter  Code Status:  DNR Goals of care: Advanced Directive information Advanced Directives 05/27/2019  Does Patient Have a Medical Advance Directive? Yes  Type of Advance Directive Out of facility DNR (pink MOST or yellow form)  Does patient want to make changes to medical advance directive? No - Patient declined  Copy of Healthcare Power of Attorney in Chart? -  Would patient like information on creating a medical advance directive? -  Pre-existing out of facility DNR order (yellow form or pink MOST form) Yellow form placed in chart (order not valid for inpatient use)     Chief Complaint  Patient presents with  . Acute Visit    Behavior    HPI:  Pt is a 85 y.o. female seen today for an acute visit for Behavior Issues and to reeval Seroquel Patient has h/o Frontal temporal Dementia with aphasia, Hypertension, PMR on Chronic Prednisone, Anxiety with Depression Patient was recently transferred from her ADL unit to SNF due to worsening dementia.  Since she has been in SNF patient has had some behavior issues including resisting care and trying to move the facility. Her behavior has been controlled with as needed low-dose  Seroquel mostly in the evening. The psychologist suggested to  Start her  on Depakote for  to help with her behavior. I discussed with the nurses and they said that they had not been using Seroquel that often. Patient did look more comfortable today. She denied any acute complaints just complaining of some dry skin. Her weight is stable   Past Medical History:  Diagnosis Date  . Acute bronchitis 06/27/2015  . Alzheimer's dementia (HCC)   . Cataract   . Cellulitis of left leg   . Depression   . Eczema   . High cholesterol   . History of renal stone    excision  . Hypertension   . Mild cognitive impairment   . Osteoarthritis   . Polymyalgia rheumatica (HCC)    rx with prednisone  . Urinary incontinence   . Varicose veins   . Xerosis of skin    Past Surgical History:  Procedure Laterality Date  . cataract surgery    . EYE SURGERY    . JOINT REPLACEMENT Bilateral    Dr. Trudee GripFrank Alusio  . kidney stone surgey    . lipoma removal      Allergies  Allergen Reactions  . Donepezil Other (See Comments)    BAD DREAMS  . Namenda [Memantine]   . Remeron [Mirtazapine] Other (See Comments)    Dizzy     Allergies as of 05/27/2019      Reactions   Donepezil Other (See Comments)   BAD DREAMS   Namenda [memantine]    Remeron [mirtazapine] Other (See Comments)   Dizzy  Medication List       Accurate as of May 27, 2019 10:51 AM. If you have any questions, ask your nurse or doctor.        acetaminophen 500 MG tablet Commonly known as: TYLENOL Take 2 tablets (1,000 mg total) by mouth every 8 (eight) hours as needed.   atenolol 25 MG tablet Commonly known as: TENORMIN Take 25 mg by mouth daily.   calcium carbonate 1500 (600 Ca) MG Tabs tablet Commonly known as: OSCAL Take 600 mg of elemental calcium by mouth 2 (two) times daily with a meal.   cholecalciferol 25 MCG (1000 UT) tablet Commonly known as: VITAMIN D Take 1,000 Units by mouth daily.   Fish Oil  1200 MG Caps Take 1 capsule by mouth daily.   hydrocortisone cream 1 % Apply 1 application topically 2 (two) times daily as needed. Apply to the chest   OCUSOFT BABY EYELID & EYELASH EX Apply topically. 1 PAD BOTH EYES; CLEAN THE LIDS 2 TIMES A DAY   predniSONE 1 MG tablet Commonly known as: DELTASONE Take 3 mg by mouth daily.   QUEtiapine 12.5 mg Tabs tablet Commonly known as: SEROQUEL Take 12.5 mg by mouth daily as needed.   sertraline 100 MG tablet Commonly known as: ZOLOFT Take 100 mg by mouth daily. Take with 25 mg = 125 mg.   sertraline 25 MG tablet Commonly known as: ZOLOFT Take 25 mg by mouth daily. 100 mg +25 mg= 125 mg   therapeutic multivitamin-minerals tablet Take 1 tablet by mouth daily.   THERATEARS OP Apply 1 drop to eye 4 (four) times daily. Both eyes   UNABLE TO FIND Med Name: 120 SAL AC 30% in Petrolatum cream. Apply to right and left cheek dark spots at bedtime.       Review of Systems  Review of Systems  Constitutional: Negative for activity change, appetite change, chills, diaphoresis, fatigue and fever.  HENT: Negative for mouth sores, postnasal drip, rhinorrhea, sinus pain and sore throat.   Respiratory: Negative for apnea, cough, chest tightness, shortness of breath and wheezing.   Cardiovascular: Negative for chest pain, palpitations and leg swelling.  Gastrointestinal: Negative for abdominal distention, abdominal pain, constipation, diarrhea, nausea and vomiting.  Genitourinary: Negative for dysuria and frequency.  Musculoskeletal: Negative for arthralgias, joint swelling and myalgias.  Skin:Dry Skin Neurological: Negative for dizziness, syncope, weakness, light-headedness and numbness.  Psychiatric/Behavioral: Negative for behavioral problems, confusion and sleep disturbance.     Immunization History  Administered Date(s) Administered  . Influenza Whole 05/28/2012, 06/01/2018  . Influenza, High Dose Seasonal PF 06/08/2016  .  Influenza-Unspecified 05/28/2014, 08/28/2014, 05/27/2015, 06/18/2017  . Pneumococcal Conjugate-13 01/21/2014  . Pneumococcal Polysaccharide-23 04/16/2015  . Td 09/29/2003  . Zoster 01/14/2011   Pertinent  Health Maintenance Due  Topic Date Due  . INFLUENZA VACCINE  03/29/2019  . DEXA SCAN  Completed  . PNA vac Low Risk Adult  Completed   Fall Risk  05/17/2018 05/10/2017 05/01/2017 07/17/2016 06/09/2015  Falls in the past year? No No No Yes No  Comment - - - tripped over the last stair in the house -  Number falls in past yr: - - - 1 -   Functional Status Survey:    Vitals:   05/27/19 1044  BP: (!) 136/58  Pulse: 69  Resp: 20  Temp: 97.6 F (36.4 C)  SpO2: 96%  Weight: 177 lb 1.6 oz (80.3 kg)   Body mass index is 28.58 kg/m. Physical Exam  Constitutional: .  Well-developed and well-nourished.  HENT:  Head: Normocephalic.  Mouth/Throat: Oropharynx is clear and moist.  Eyes: Pupils are equal, round, and reactive to light.  Neck: Neck supple.  Cardiovascular: Normal rate and normal heart sounds.  No murmur heard. Pulmonary/Chest: Effort normal and breath sounds normal. No respiratory distress. No wheezes. She has no rales.  Abdominal: Soft. Bowel sounds are normal. No distension. There is no tenderness. There is no rebound.  Musculoskeletal: No edema.  Lymphadenopathy: none Neurological:No Focal Deficits Walk with Walker. Have Aphasia  Skin: Skin is warm and dry.  Psychiatric: Normal mood and affect. Behavior is normal. Thought content normal.    Labs reviewed: Recent Labs    11/21/18 12/10/18 03/25/19 05/08/19  NA 137 141 139 143  K 4.4 3.7 4.2 3.7  CL 102  --  102 105  CO2 22  --  28 31  BUN 27* 24* 16 16  CREATININE 0.8 0.6 0.7 0.7  CALCIUM 10.8  --  10.0 9.5   Recent Labs    11/21/18 12/10/18 03/25/19 05/08/19  AST 30 17 22 18   ALT 23 15 16 15   ALKPHOS 74 65 69 76  PROT 7.0  --  6.5 5.6*  ALBUMIN 4.6  --  3.9 3.7   Recent Labs    12/10/18 03/25/19  05/08/19  WBC 7.8 10.9 6.8  HGB 13.7 15.4 13.6  HCT 41 46 41  PLT 220 287 235   Lab Results  Component Value Date   TSH 2.77 04/09/2018   Lab Results  Component Value Date   HGBA1C 5.3 04/26/2017   Lab Results  Component Value Date   CHOL 217 (A) 04/09/2018   HDL 51 04/09/2018   LDLCALC 147 04/09/2018   LDLDIRECT 173.0 08/07/2012   TRIG 86 04/09/2018   CHOLHDL 5 03/20/2016    Significant Diagnostic Results in last 30 days:  No results found.  Assessment/Plan Late onset Alzheimer's disease with Recent Behavior Issues Will continue on Low dose of Seroquel for few more weeks I do not want to start her on Depakote yet Per nurses patient is getting used to the new room. Patient has not tolerated Aricept and Namenda in the past Has severe aphasia Essential hypertension, benign Seems controlled on Tenormin  Depression with anxiety We will continue on Zoloft Would not increase the dose at this time No GDR recommended  Polymyalgia rheumatica (HCC) On low-dose of prednisone DEXA scan in 10/18 showed T score -1  Chronic venous insufficiency Stable continue to follow    Family/ staff Communication:  Labs/tests ordered:   Total time spent in this patient care encounter was  _25  minutes; greater than 50% of the visit spent counseling patient and staff, reviewing records , Labs and coordinating care for problems addressed at this encounter.

## 2019-06-05 ENCOUNTER — Non-Acute Institutional Stay (SKILLED_NURSING_FACILITY): Payer: Medicare Other | Admitting: Internal Medicine

## 2019-06-05 ENCOUNTER — Encounter: Payer: Self-pay | Admitting: Internal Medicine

## 2019-06-05 DIAGNOSIS — F418 Other specified anxiety disorders: Secondary | ICD-10-CM

## 2019-06-05 DIAGNOSIS — F0280 Dementia in other diseases classified elsewhere without behavioral disturbance: Secondary | ICD-10-CM

## 2019-06-05 DIAGNOSIS — I872 Venous insufficiency (chronic) (peripheral): Secondary | ICD-10-CM | POA: Diagnosis not present

## 2019-06-05 DIAGNOSIS — I10 Essential (primary) hypertension: Secondary | ICD-10-CM | POA: Diagnosis not present

## 2019-06-05 DIAGNOSIS — M353 Polymyalgia rheumatica: Secondary | ICD-10-CM | POA: Diagnosis not present

## 2019-06-05 DIAGNOSIS — G301 Alzheimer's disease with late onset: Secondary | ICD-10-CM

## 2019-06-05 NOTE — Progress Notes (Signed)
Location:    [facility]ursing Home Room Number: 5 Place of Service:  SNF (31) Provider:  Einar CrowGupta, Anjali MD  Mast, Man X, NP  Patient Care Team: Mast, Man X, NP as PCP - General (Internal Medicine) Donnetta HailBeekman, James F, MD (Rheumatology) Ernesto RutherfordGroat, Robert, MD (Ophthalmology) Nita SellsHall, John, MD (Dermatology) Crist FatHerrick, Benjamin W, MD as Attending Physician (Urology) Mast, Man X, NP as Nurse Practitioner (Internal Medicine)  Extended Emergency Contact Information Primary Emergency Contact: McClanahan,Susan Address: [address]          [place], [place] [address] [place] Home Phone: [phone] Mobile Phone: [phone] Relation: Daughter Secondary Emergency Contact: Gardiner SleeperBaker,Carolanne  [place] Home Phone: [phone] Relation: Daughter  Code Status:  DNR Goals of care: Advanced Directive information Advanced Directives 06/05/2019  Does Patient Have a Medical Advance Directive? Yes  Type of Advance Directive Out of facility DNR (pink MOST or yellow form)  Does patient want to make changes to medical advance directive? No - Patient declined  Copy of Healthcare Power of Attorney in Chart? -  Would patient like information on creating a medical advance directive? -  Pre-existing out of facility DNR order (yellow form or pink MOST form) Yellow form placed in chart (order not valid for inpatient use)     Chief Complaint  Patient presents with  . Medical Management of Chronic Issues  . Health Maintenance    Influenza vaccine    HPI:  Pt is a 85 y.o. female seen today for medical management of chronic diseases.   Patient has h/o Frontal temporal Dementia with aphasia, Hypertension, PMR on Chronic Prednisone, Anxiety with Depression  Patient continues to have behavior issues in the facility.  Few days ago she left the health care and was walking to the  independent apartments.  She had to be redirected by the security. She also resists care sometimes  refuses medicines. Per nurses her appetite is fair and her weight is staying stable. She walks with a walker and has not had any falls recently Plan is to move her to a locked memory care unit.  Her daughter is going to come and approve the move. Her other problems are stable. She did not have any acute complaints she continues to have aphasia.   Past Medical History:  Diagnosis Date  . Acute bronchitis 06/27/2015  . Alzheimer's dementia (HCC)   . Cataract   . Cellulitis of left leg   . Depression   . Eczema   . High cholesterol   . History of renal stone    excision  . Hypertension   . Mild cognitive impairment   . Osteoarthritis   . Polymyalgia rheumatica (HCC)    rx with prednisone  . Urinary incontinence   . Varicose veins   . Xerosis of skin    Past Surgical History:  Procedure Laterality Date  . cataract surgery    . EYE SURGERY    . JOINT REPLACEMENT Bilateral    Dr. Trudee GripFrank Alusio  . kidney stone surgey    . lipoma removal      Allergies  Allergen Reactions  . Donepezil Other (See Comments)    BAD DREAMS  . Namenda [Memantine]   . Remeron [Mirtazapine] Other (See Comments)    Dizzy     Allergies as of 06/05/2019      Reactions   Donepezil Other (See Comments)   BAD DREAMS   Namenda [memantine]    Remeron [mirtazapine] Other (See Comments)  Dizzy       Medication List       Accurate as of June 05, 2019  9:44 AM. If you have any questions, ask your nurse or doctor.        acetaminophen 500 MG tablet Commonly known as: TYLENOL Take 2 tablets (1,000 mg total) by mouth every 8 (eight) hours as needed.   atenolol 25 MG tablet Commonly known as: TENORMIN Take 25 mg by mouth daily.   calcium carbonate 1500 (600 Ca) MG Tabs tablet Commonly known as: OSCAL Take 600 mg of elemental calcium by mouth 2 (two) times daily with a meal.   cholecalciferol 25 MCG (1000 UT) tablet Commonly known as: VITAMIN D Take 1,000 Units by mouth daily.   Fish  Oil 1200 MG Caps Take 1 capsule by mouth daily.   hydrocortisone cream 1 % Apply 1 application topically 2 (two) times daily as needed. Apply to the chest   OCUSOFT BABY EYELID & EYELASH EX Apply topically. 1 PAD BOTH EYES; CLEAN THE LIDS 2 TIMES A DAY   predniSONE 1 MG tablet Commonly known as: DELTASONE Take 3 mg by mouth daily.   QUEtiapine 12.5 mg Tabs tablet Commonly known as: SEROQUEL Take 12.5 mg by mouth daily as needed.   sertraline 100 MG tablet Commonly known as: ZOLOFT Take 100 mg by mouth daily. Take with 25 mg = 125 mg.   sertraline 25 MG tablet Commonly known as: ZOLOFT Take 25 mg by mouth daily. 100 mg +25 mg= 125 mg   therapeutic multivitamin-minerals tablet Take 1 tablet by mouth daily.   THERATEARS OP Apply 1 drop to eye 4 (four) times daily. Both eyes   UNABLE TO FIND Med Name: 120 SAL AC 30% in Petrolatum cream. Apply to right and left cheek dark spots at bedtime.       Review of Systems  Constitutional: Negative.   Respiratory: Negative.   Genitourinary: Negative.   Musculoskeletal: Negative.   Neurological: Negative.   Psychiatric/Behavioral: Positive for behavioral problems, confusion and dysphoric mood.  All other systems reviewed and are negative.   Immunization History  Administered Date(s) Administered  . Influenza Whole 05/28/2012, 06/01/2018  . Influenza, High Dose Seasonal PF 06/08/2016  . Influenza-Unspecified 05/28/2014, 08/28/2014, 05/27/2015, 06/18/2017  . Pneumococcal Conjugate-13 01/21/2014  . Pneumococcal Polysaccharide-23 04/16/2015  . Td 09/29/2003  . Zoster 01/14/2011   Pertinent  Health Maintenance Due  Topic Date Due  . INFLUENZA VACCINE  03/29/2019  . DEXA SCAN  Completed  . PNA vac Low Risk Adult  Completed   Fall Risk  05/17/2018 05/10/2017 05/01/2017 07/17/2016 06/09/2015  Falls in the past year? No No No Yes No  Comment - - - tripped over the last stair in the house -  Number falls in past yr: - - - 1 -    Functional Status Survey:    Vitals:   06/05/19 0940  BP: 124/62  Pulse: 72  Resp: 18  Temp: (!) 97.5 F (36.4 C)  SpO2: 95%  Weight: 177 lb 12.8 oz (80.6 kg)  Height: 5\' 6"  (1.676 m)   Body mass index is 28.7 kg/m. Physical Exam Vitals signs reviewed.  Constitutional:      Appearance: Normal appearance.  HENT:     Head: Normocephalic.     Nose: Nose normal.     Mouth/Throat:     Mouth: Mucous membranes are moist.     Pharynx: Oropharynx is clear.  Eyes:     Pupils: Pupils are  equal, round, and reactive to light.  Neck:     Musculoskeletal: Neck supple.  Cardiovascular:     Rate and Rhythm: Normal rate and regular rhythm.     Pulses: Normal pulses.  Pulmonary:     Effort: Pulmonary effort is normal.     Breath sounds: Normal breath sounds.  Abdominal:     General: Abdomen is flat. Bowel sounds are normal.     Palpations: Abdomen is soft.  Musculoskeletal:        General: Swelling present.  Skin:    General: Skin is warm.  Neurological:     General: No focal deficit present.     Mental Status: She is alert.  Psychiatric:        Mood and Affect: Mood normal.        Thought Content: Thought content normal.     Labs reviewed: Recent Labs    11/21/18 12/10/18 03/25/19 05/08/19  NA 137 141 139 143  K 4.4 3.7 4.2 3.7  CL 102  --  102 105  CO2 22  --  28 31  BUN 27* 24* 16 16  CREATININE 0.8 0.6 0.7 0.7  CALCIUM 10.8  --  10.0 9.5   Recent Labs    11/21/18 12/10/18 03/25/19 05/08/19  AST 30 17 22 18   ALT 23 15 16 15   ALKPHOS 74 65 69 76  PROT 7.0  --  6.5 5.6*  ALBUMIN 4.6  --  3.9 3.7   Recent Labs    12/10/18 03/25/19 05/08/19  WBC 7.8 10.9 6.8  HGB 13.7 15.4 13.6  HCT 41 46 41  PLT 220 287 235   Lab Results  Component Value Date   TSH 2.77 04/09/2018   Lab Results  Component Value Date   HGBA1C 5.3 04/26/2017   Lab Results  Component Value Date   CHOL 217 (A) 04/09/2018   HDL 51 04/09/2018   LDLCALC 147 04/09/2018   LDLDIRECT  173.0 08/07/2012   TRIG 86 04/09/2018   CHOLHDL 5 03/20/2016    Significant Diagnostic Results in last 30 days:  No results found.  Assessment/Plan Essential hypertension, benign Stable on Tenormin  Depression with anxiety On Zoloft   Polymyalgia rheumatica (HCC) Continue on Low dose of prednisone DEXA scan in 10/18 showed T score -1   Chronic venous insufficiency Continue to follow  Late onset Alzheimer's disease with Behavior issues On Low dose of Seroquel Patient has not tolerated Aricept and Namenda in the past Has severe aphasia   Family/ staff Communication:   Labs/tests ordered:    Total time spent in this patient care encounter was  25_  minutes; greater than 50% of the visit spent counseling patient and staff, reviewing records , Labs and coordinating care for problems addressed at this encounter.

## 2019-06-25 ENCOUNTER — Encounter: Payer: Self-pay | Admitting: Nurse Practitioner

## 2019-06-25 ENCOUNTER — Other Ambulatory Visit: Payer: Self-pay

## 2019-06-25 ENCOUNTER — Non-Acute Institutional Stay (SKILLED_NURSING_FACILITY): Payer: Medicare Other | Admitting: Nurse Practitioner

## 2019-06-25 DIAGNOSIS — M353 Polymyalgia rheumatica: Secondary | ICD-10-CM | POA: Diagnosis not present

## 2019-06-25 DIAGNOSIS — G301 Alzheimer's disease with late onset: Secondary | ICD-10-CM | POA: Diagnosis not present

## 2019-06-25 DIAGNOSIS — F418 Other specified anxiety disorders: Secondary | ICD-10-CM | POA: Diagnosis not present

## 2019-06-25 DIAGNOSIS — I10 Essential (primary) hypertension: Secondary | ICD-10-CM | POA: Diagnosis not present

## 2019-06-25 DIAGNOSIS — F0280 Dementia in other diseases classified elsewhere without behavioral disturbance: Secondary | ICD-10-CM

## 2019-06-25 MED ORDER — LORAZEPAM 0.5 MG PO TABS: 0.5000 mg | ORAL_TABLET | Freq: Three times a day (TID) | ORAL | 0 refills | Status: DC | PRN

## 2019-06-25 NOTE — Assessment & Plan Note (Signed)
Blood pressure is controlled, continue Atenolol 25mg qd.  

## 2019-06-25 NOTE — Assessment & Plan Note (Signed)
She sleeps/eats at her baseline, weight is stable. Sundowning symptoms with anxiety in pm noted. 06/25/19 Lorazepam 0.5mg  q8hr prn x 14 days. Continue Sertraline 125mg  qd.

## 2019-06-25 NOTE — Progress Notes (Signed)
Location:    SNF [facility]   Place of Service:   SNF [facility] Stafford [facility]  Kean Gautreau X, NP  Patient Care Team: Laurence Crofford X, NP as PCP - General (Internal Medicine) Donnetta HailBeekman, James F, MD (Rheumatology) Ernesto RutherfordGroat, Robert, MD (Ophthalmology) Nita SellsHall, John, MD (Dermatology) Crist FatHerrick, Benjamin W, MD as Attending Physician (Urology) Stanford Strauch X, NP as Nurse Practitioner (Internal Medicine)  Extended Emergency Contact Information Primary Emergency Contact: McClanahan,Susan Address: [address]          [place], [place] [address] [place] Home Phone: [phone] Mobile Phone: [phone] Relation: Daughter Secondary Emergency Contact: Gardiner SleeperBaker,Carolanne  [place] Home Phone: [phone] Relation: Daughter  Code Status: DNR Goals of care: Advanced Directive information Advanced Directives 06/05/2019  Does Patient Have a Medical Advance Directive? Yes  Type of Advance Directive Out of facility DNR (pink MOST or yellow form)  Does patient want to make changes to medical advance directive? No - Patient declined  Copy of Healthcare Power of Attorney in Chart? -  Would patient like information on creating a medical advance directive? -  Pre-existing out of facility DNR order (yellow form or pink MOST form) Yellow form placed in chart (order not valid for inpatient use)     Chief Complaint  Patient presents with   Acute Visit    anxiety    HPI:  Pt is a 85 y.o. female seen today for an acute visit for the patient's family and staff reported the patient has sundowning symptoms, exit seeking, anxious episodes, usually she is easily redirected, the HPOA requested prn anxiolytic for trial. [facility] care assistance. Her mood is managed on Sertraline 125mg  qd, her weight is stable, eats/sleeps at her baseline. Polymyalgia Rheumatica, pain is controlled on Prednisone 3mg  qd, prn Tylenol. HTN, blood pressure  is controlled on Atenolol 25mg  qd.    Past Medical History:  Diagnosis Date   Acute bronchitis 06/27/2015   Alzheimer's dementia (HCC)    Cataract    Cellulitis of left leg    Depression    Eczema    High cholesterol    History of renal stone    excision   Hypertension    Mild cognitive impairment    Osteoarthritis    Polymyalgia rheumatica (HCC)    rx with prednisone   Urinary incontinence    Varicose veins    Xerosis of skin    Past Surgical History:  Procedure Laterality Date   cataract surgery     EYE SURGERY     JOINT REPLACEMENT Bilateral    Dr. Trudee GripFrank Alusio   kidney stone surgey     lipoma removal      Allergies  Allergen Reactions   Donepezil Other (See Comments)    BAD DREAMS   Namenda [Memantine]    Remeron [Mirtazapine] Other (See Comments)    Dizzy     Allergies as of 06/25/2019      Reactions   Donepezil Other (See Comments)   BAD DREAMS   Namenda [memantine]    Remeron [mirtazapine] Other (See Comments)   Dizzy       Medication List       Accurate as of June 25, 2019  3:51 PM. If you have any questions, ask your nurse or doctor.        acetaminophen 500 MG tablet Commonly known as: TYLENOL Take 2 tablets (1,000 mg total) by mouth every  8 (eight) hours as needed.   atenolol 25 MG tablet Commonly known as: TENORMIN Take 25 mg by mouth daily.   calcium carbonate 1500 (600 Ca) MG Tabs tablet Commonly known as: OSCAL Take 600 mg of elemental calcium by mouth 2 (two) times daily with a meal.   cholecalciferol 25 MCG (1000 UT) tablet Commonly known as: VITAMIN D Take 1,000 Units by mouth daily.   Fish Oil 1200 MG Caps Take 1 capsule by mouth daily.   hydrocortisone cream 1 % Apply 1 application topically 2 (two) times daily as needed. Apply to the chest   LORazepam 0.5 MG tablet Commonly known as: ATIVAN Take 0.5 mg by mouth every 8 (eight) hours as needed for anxiety. X 14 days   OCUSOFT BABY EYELID  & EYELASH EX Apply topically. 1 PAD BOTH EYES; CLEAN THE LIDS 2 TIMES A DAY   predniSONE 1 MG tablet Commonly known as: DELTASONE Take 3 mg by mouth daily.   QUEtiapine 12.5 mg Tabs tablet Commonly known as: SEROQUEL Take 12.5 mg by mouth daily as needed.   sertraline 100 MG tablet Commonly known as: ZOLOFT Take 100 mg by mouth daily. Take with 25 mg = 125 mg.   sertraline 25 MG tablet Commonly known as: ZOLOFT Take 25 mg by mouth daily. 100 mg +25 mg= 125 mg   therapeutic multivitamin-minerals tablet Take 1 tablet by mouth daily.   THERATEARS OP Apply 1 drop to eye 4 (four) times daily. Both eyes   UNABLE TO FIND Med Name: 120 SAL AC 30% in Petrolatum cream. Apply to right and left cheek dark spots at bedtime.      ROS was provided with assistance of staff.  Review of Systems  Constitutional: Negative for activity change, appetite change, chills, diaphoresis, fatigue, fever and unexpected weight change.  HENT: Positive for hearing loss. Negative for congestion and voice change.   Respiratory: Negative for cough, shortness of breath and wheezing.   Cardiovascular: Positive for leg swelling. Negative for chest pain and palpitations.  Gastrointestinal: Negative for abdominal distention, abdominal pain, constipation, diarrhea, nausea and vomiting.  Genitourinary: Negative for difficulty urinating, dysuria, hematuria and urgency.  Musculoskeletal: Positive for gait problem and myalgias.  Skin: Negative for color change.  Neurological: Negative for dizziness, speech difficulty, weakness and headaches.       Dementia  Psychiatric/Behavioral: Positive for agitation, behavioral problems, confusion and dysphoric mood. Negative for hallucinations and sleep disturbance. The patient is nervous/anxious.     Immunization History  Administered Date(s) Administered   Influenza Whole 05/28/2012, 06/01/2018   Influenza, High Dose Seasonal PF 06/08/2016   Influenza-Unspecified  05/28/2014, 08/28/2014, 05/27/2015, 06/18/2017   Pneumococcal Conjugate-13 01/21/2014   Pneumococcal Polysaccharide-23 04/16/2015   Td 09/29/2003   Zoster 01/14/2011   Pertinent  Health Maintenance Due  Topic Date Due   INFLUENZA VACCINE  03/29/2019   DEXA SCAN  Completed   PNA vac Low Risk Adult  Completed   Fall Risk  05/17/2018 05/10/2017 05/01/2017 07/17/2016 06/09/2015  Falls in the past year? No No No Yes No  Comment - - - tripped over the last stair in the house -  Number falls in past yr: - - - 1 -   Functional Status Survey:    Vitals:   06/25/19 1537  BP: 138/88  Pulse: 60  Resp: 18  Temp: (!) 96.9 F (36.1 C)  SpO2: 94%   There is no height or weight on file to calculate BMI. Physical Exam Vitals signs  and nursing note reviewed.  Constitutional:      General: She is not in acute distress.    Appearance: Normal appearance. She is not ill-appearing, toxic-appearing or diaphoretic.  HENT:     Head: Normocephalic.     Nose: Nose normal.     Mouth/Throat:     Mouth: Mucous membranes are moist.  Eyes:     Extraocular Movements: Extraocular movements intact.     Conjunctiva/sclera: Conjunctivae normal.     Pupils: Pupils are equal, round, and reactive to light.  Neck:     Musculoskeletal: Normal range of motion and neck supple.  Cardiovascular:     Rate and Rhythm: Normal rate and regular rhythm.     Heart sounds: No murmur.  Pulmonary:     Breath sounds: No wheezing, rhonchi or rales.  Abdominal:     General: Bowel sounds are normal. There is no distension.     Palpations: Abdomen is soft.     Tenderness: There is no abdominal tenderness. There is no right CVA tenderness, left CVA tenderness, guarding or rebound.  Musculoskeletal:     Right lower leg: Edema present.     Left lower leg: Edema present.     Comments: Trace edema BLE  Skin:    General: Skin is warm and dry.  Neurological:     General: No focal deficit present.     Mental Status: She  is alert. Mental status is at baseline.     Cranial Nerves: No cranial nerve deficit.     Motor: No weakness.     Coordination: Coordination normal.     Gait: Gait abnormal.     Comments: Oriented to self.  Psychiatric:     Comments: Confused.      Labs reviewed: Recent Labs    11/21/18 12/10/18 03/25/19 05/08/19  NA 137 141 139 143  K 4.4 3.7 4.2 3.7  CL 102  --  102 105  CO2 22  --  28 31  BUN 27* 24* 16 16  CREATININE 0.8 0.6 0.7 0.7  CALCIUM 10.8  --  10.0 9.5   Recent Labs    11/21/18 12/10/18 03/25/19 05/08/19  AST 30 17 22 18   ALT 23 15 16 15   ALKPHOS 74 65 69 76  PROT 7.0  --  6.5 5.6*  ALBUMIN 4.6  --  3.9 3.7   Recent Labs    12/10/18 03/25/19 05/08/19  WBC 7.8 10.9 6.8  HGB 13.7 15.4 13.6  HCT 41 46 41  PLT 220 287 235   Lab Results  Component Value Date   TSH 2.77 04/09/2018   Lab Results  Component Value Date   HGBA1C 5.3 04/26/2017   Lab Results  Component Value Date   CHOL 217 (A) 04/09/2018   HDL 51 04/09/2018   LDLCALC 147 04/09/2018   LDLDIRECT 173.0 08/07/2012   TRIG 86 04/09/2018   CHOLHDL 5 03/20/2016    Significant Diagnostic Results in last 30 days:  No results found.  Assessment/Plan: Depression with anxiety She sleeps/eats at her baseline, weight is stable. Sundowning symptoms with anxiety in pm noted. 06/25/19 Lorazepam 0.5mg  q8hr prn x 14 days. Continue Sertraline 125mg  qd.   Polymyalgia rheumatica (HCC) Aches/pains is well controlled, continue Prednisone 3mg  qd, prn Tylenol.   Essential hypertension, benign Blood pressure is controlled, continue Atenolol 25mg  qd.   Late onset Alzheimer's disease without behavioral disturbance ([place]) Advanced, continue memory care unit [facility] for safety, care assistance.     Family/  staff Communication: plan of care reviewed with the patient and charge nurse.   Labs/tests ordered:  None  Time spend 25 minutes.

## 2019-06-25 NOTE — Assessment & Plan Note (Signed)
Advanced, continue memory care unit [facility] for safety, care assistance.  

## 2019-06-25 NOTE — Assessment & Plan Note (Signed)
Aches/pains is well controlled, continue Prednisone 3mg  qd, prn Tylenol.

## 2019-06-26 MED ORDER — LORAZEPAM 0.5 MG PO TABS: 0.5000 mg | ORAL_TABLET | Freq: Three times a day (TID) | ORAL | 0 refills | Status: DC | PRN

## 2019-06-26 NOTE — Addendum Note (Signed)
Addended by: Logan Bores on: 06/26/2019 10:19 AM   Modules accepted: Orders

## 2019-06-26 NOTE — Telephone Encounter (Signed)
E-prescribing error occurred and populated in the [place]., will send to Mast, Man X, NP to review and approve

## 2019-07-07 ENCOUNTER — Non-Acute Institutional Stay (SKILLED_NURSING_FACILITY): Payer: Medicare Other | Admitting: Nurse Practitioner

## 2019-07-07 ENCOUNTER — Encounter: Payer: Self-pay | Admitting: Nurse Practitioner

## 2019-07-07 DIAGNOSIS — F0280 Dementia in other diseases classified elsewhere without behavioral disturbance: Secondary | ICD-10-CM

## 2019-07-07 DIAGNOSIS — I10 Essential (primary) hypertension: Secondary | ICD-10-CM

## 2019-07-07 DIAGNOSIS — F418 Other specified anxiety disorders: Secondary | ICD-10-CM

## 2019-07-07 DIAGNOSIS — G301 Alzheimer's disease with late onset: Secondary | ICD-10-CM

## 2019-07-07 NOTE — Assessment & Plan Note (Signed)
Blood pressure is controlled, continue Atenolol 25mg qd.  

## 2019-07-07 NOTE — Assessment & Plan Note (Signed)
Advanced dementia, continue memory care unit [facility] for safety, care assistance.

## 2019-07-07 NOTE — Progress Notes (Signed)
Location:   S[facility] [place] Room Number: 110 Place of Service:  SNF (31) Provider:  Danyella Mcginty X, NP  Lynnette Pote X, NP  Patient Care Team: Ellouise Mcwhirter X, NP as PCP - General (Internal Medicine) Hennie Duos, MD (Rheumatology) Clent Jacks, MD (Ophthalmology) Allyn Kenner, MD (Dermatology) Ardis Hughs, MD as Attending Physician (Urology) Diannie Willner X, NP as Nurse Practitioner (Internal Medicine)  Extended Emergency Contact Information Primary Emergency Contact: McClanahan,Susan Address: [place], [place] [address] Johnnette Litter of [place] Phone: [phone] Mobile Phone: [phone] Relation: Daughter Secondary Emergency Contact: Newman Nip [place] of [place] Phone: [phone] Relation: Daughter  Code Status:  DNR Goals of care: Advanced Directive information Advanced Directives 06/05/2019  Does Patient Have a Medical Advance Directive? Yes  Type of Advance Directive Out of facility DNR (pink MOST or yellow form)  Does patient want to make changes to medical advance directive? No - Patient declined  Copy of [place] in Chart? -  Would patient like information on creating a medical advance directive? -  Pre-existing out of facility DNR order (yellow form or pink MOST form) Yellow form placed in chart (order not valid for inpatient use)     Chief Complaint  Patient presents with   Acute Visit    Agitation    HPI:  Pt is a 85 y.o. female seen today for an acute visit for reported increased confusion, slurred speech after prn Ativan x1 evening 07/06/19. The patient was seen sleeping on commode, easily aroused, but falls asleep again. The patient has advanced dementia, resides in memory care unit [facility], frequently exit seeking, frustrated/agitation when redirected, talking to objects. HPI was provided with assistance of staff. No noted focal weakness except a few jerky movements. Hx of  depression/anxiety, taking Sertraline 125mg  qd, HTN, blood pressure is controlled on Atenolol 25mg  qd.    Past Medical History:  Diagnosis Date   Acute bronchitis 06/27/2015   Alzheimer's dementia ([place])    Cataract    Cellulitis of left leg    Depression    Eczema    High cholesterol    History of renal stone    excision   Hypertension    Mild cognitive impairment    Osteoarthritis    Polymyalgia rheumatica (HCC)    rx with prednisone   Urinary incontinence    Varicose veins    Xerosis of skin    Past Surgical History:  Procedure Laterality Date   cataract surgery     EYE SURGERY     JOINT REPLACEMENT Bilateral    Dr. Hector Shade   kidney stone surgey     lipoma removal      Allergies  Allergen Reactions   Donepezil Other (See Comments)    BAD DREAMS   Namenda [Memantine]    Remeron [Mirtazapine] Other (See Comments)    Dizzy     Allergies as of 07/07/2019      Reactions   Donepezil Other (See Comments)   BAD DREAMS   Namenda [memantine]    Remeron [mirtazapine] Other (See Comments)   Dizzy       Medication List       Accurate as of July 07, 2019 12:32 PM. If you have any questions, ask your nurse or doctor.        acetaminophen 500 MG tablet Commonly known as: TYLENOL Take 2 tablets (1,000 mg  total) by mouth every 8 (eight) hours as needed.   atenolol 25 MG tablet Commonly known as: TENORMIN Take 25 mg by mouth daily.   calcium carbonate 1500 (600 Ca) MG Tabs tablet Commonly known as: OSCAL Take 600 mg of elemental calcium by mouth 2 (two) times daily with a meal.   cholecalciferol 25 MCG (1000 UT) tablet Commonly known as: VITAMIN D Take 1,000 Units by mouth daily.   Fish Oil 1200 MG Caps Take 1 capsule by mouth daily.   hydrocortisone cream 1 % Apply 1 application topically 2 (two) times daily as needed. Apply to the chest   LORazepam 0.5 MG tablet Commonly known as: ATIVAN Take 1 tablet (0.5 mg total) by  mouth every 8 (eight) hours as needed for anxiety. X 14 days   OCUSOFT BABY EYELID & EYELASH EX Apply topically. 1 PAD BOTH EYES; CLEAN THE LIDS 2 TIMES A DAY   predniSONE 1 MG tablet Commonly known as: DELTASONE Take 3 mg by mouth daily.   QUEtiapine 12.5 mg Tabs tablet Commonly known as: SEROQUEL Take 12.5 mg by mouth daily as needed.   sertraline 100 MG tablet Commonly known as: ZOLOFT Take 100 mg by mouth daily. Take with 25 mg = 125 mg.   sertraline 25 MG tablet Commonly known as: ZOLOFT Take 25 mg by mouth daily. 100 mg +25 mg= 125 mg   therapeutic multivitamin-minerals tablet Take 1 tablet by mouth daily.   THERATEARS OP Apply 1 drop to eye 4 (four) times daily. Both eyes   UNABLE TO FIND Med Name: 120 SAL AC 30% in Petrolatum cream. Apply to right and left cheek dark spots at bedtime.      ROS was provided with assistance of staff.  Review of Systems  Constitutional: Positive for activity change and fatigue. Negative for appetite change, chills, diaphoresis and fever.  HENT: Positive for hearing loss. Negative for voice change.   Eyes: Negative for visual disturbance.  Respiratory: Negative for cough, shortness of breath and wheezing.   Cardiovascular: Positive for leg swelling. Negative for chest pain.  Gastrointestinal: Negative for abdominal distention, abdominal pain, constipation, diarrhea, nausea and vomiting.  Genitourinary: Positive for urgency. Negative for difficulty urinating and dysuria.  Musculoskeletal: Positive for arthralgias, gait problem and myalgias.  Skin: Negative for color change and pallor.  Neurological: Negative for dizziness, facial asymmetry, speech difficulty, weakness and headaches.       Dementia  Psychiatric/Behavioral: Positive for agitation, behavioral problems, confusion and dysphoric mood. Negative for sleep disturbance. The patient is nervous/anxious.     Immunization History  Administered Date(s) Administered   Influenza  Whole 05/28/2012, 06/01/2018   Influenza, High Dose Seasonal PF 06/08/2016   Influenza-Unspecified 05/28/2014, 08/28/2014, 05/27/2015, 06/18/2017   Pneumococcal Conjugate-13 01/21/2014   Pneumococcal Polysaccharide-23 04/16/2015   Td 09/29/2003   Zoster 01/14/2011   Pertinent  Health Maintenance Due  Topic Date Due   INFLUENZA VACCINE  03/29/2019   DEXA SCAN  Completed   PNA vac Low Risk Adult  Completed   Fall Risk  05/17/2018 05/10/2017 05/01/2017 07/17/2016 06/09/2015  Falls in the past year? No No No Yes No  Comment - - - tripped over the last stair in the house -  Number falls in past yr: - - - 1 -   Functional Status Survey:    Vitals:   07/07/19 1150  BP: 140/68  Pulse: 64  Resp: 20  Temp: (!) 97.1 F (36.2 C)  SpO2: 97%  Weight: 174 lb  6.4 oz (79.1 kg)  Height: 5\' 6"  (1.676 m)   Body mass index is 28.15 kg/m. Physical Exam Vitals signs and nursing note reviewed.  Constitutional:      General: She is not in acute distress.    Appearance: Normal appearance. She is not toxic-appearing or diaphoretic.  HENT:     Head: Normocephalic and atraumatic.     Nose: Nose normal.     Mouth/Throat:     Mouth: Mucous membranes are moist.  Eyes:     Extraocular Movements: Extraocular movements intact.     Conjunctiva/sclera: Conjunctivae normal.     Pupils: Pupils are equal, round, and reactive to light.  Neck:     Musculoskeletal: Normal range of motion and neck supple.  Cardiovascular:     Rate and Rhythm: Normal rate and regular rhythm.     Heart sounds: No murmur.  Pulmonary:     Breath sounds: No wheezing, rhonchi or rales.  Abdominal:     Tenderness: There is no abdominal tenderness. There is no right CVA tenderness, left CVA tenderness, guarding or rebound.  Musculoskeletal:     Right lower leg: Edema present.     Left lower leg: Edema present.     Comments: Trace edema BLE  Skin:    General: Skin is warm and dry.  Neurological:     General: No  focal deficit present.     Mental Status: She is alert. Mental status is at baseline.     Motor: No weakness.     Coordination: Coordination normal.     Gait: Gait abnormal.     Comments: Oriented to self.   Psychiatric:     Comments: Sleepy, confused.      Labs reviewed: Recent Labs    11/21/18 12/10/18 03/25/19 05/08/19  NA 137 141 139 143  K 4.4 3.7 4.2 3.7  CL 102  --  102 105  CO2 22  --  28 31  BUN 27* 24* 16 16  CREATININE 0.8 0.6 0.7 0.7  CALCIUM 10.8  --  10.0 9.5   Recent Labs    11/21/18 12/10/18 03/25/19 05/08/19  AST 30 17 22 18   ALT 23 15 16 15   ALKPHOS 74 65 69 76  PROT 7.0  --  6.5 5.6*  ALBUMIN 4.6  --  3.9 3.7   Recent Labs    12/10/18 03/25/19 05/08/19  WBC 7.8 10.9 6.8  HGB 13.7 15.4 13.6  HCT 41 46 41  PLT 220 287 235   Lab Results  Component Value Date   TSH 2.77 04/09/2018   Lab Results  Component Value Date   HGBA1C 5.3 04/26/2017   Lab Results  Component Value Date   CHOL 217 (A) 04/09/2018   HDL 51 04/09/2018   LDLCALC 147 04/09/2018   LDLDIRECT 173.0 08/07/2012   TRIG 86 04/09/2018   CHOLHDL 5 03/20/2016    Significant Diagnostic Results in last 30 days:  No results found.  Assessment/Plan Depression with anxiety Will dc prn Lorazepam, only used x1 in the past 2 weeks with negative side effects of increased confusion, slurred speech, jerky body movements, and excessive sleepiness next day, R>B. Continue Sertraline 125mg  qd, observe.   Late onset Alzheimer's disease without behavioral disturbance (HCC) Advanced dementia, continue memory care unit [facility] for safety, care assistance.   Essential hypertension, benign Blood pressure is controlled, continue Atenolol 25mg  qd.      Family/ staff Communication: plan of care reviewed with the patient and charge  nurse.   Labs/tests ordered:  none  Time spend 25 minutes.

## 2019-07-07 NOTE — Assessment & Plan Note (Addendum)
Will dc prn Lorazepam, only used x1 in the past 2 weeks with negative side effects of increased confusion, slurred speech, jerky body movements, and excessive sleepiness next day, R>B. Continue Sertraline 125mg  qd, observe.

## 2019-07-14 ENCOUNTER — Encounter: Payer: Self-pay | Admitting: Nurse Practitioner

## 2019-07-14 ENCOUNTER — Non-Acute Institutional Stay (SKILLED_NURSING_FACILITY): Payer: Medicare Other | Admitting: Nurse Practitioner

## 2019-07-14 DIAGNOSIS — H0100B Unspecified blepharitis left eye, upper and lower eyelids: Secondary | ICD-10-CM

## 2019-07-14 DIAGNOSIS — H01003 Unspecified blepharitis right eye, unspecified eyelid: Secondary | ICD-10-CM | POA: Insufficient documentation

## 2019-07-14 DIAGNOSIS — H0100A Unspecified blepharitis right eye, upper and lower eyelids: Secondary | ICD-10-CM

## 2019-07-14 DIAGNOSIS — F0280 Dementia in other diseases classified elsewhere without behavioral disturbance: Secondary | ICD-10-CM

## 2019-07-14 DIAGNOSIS — G301 Alzheimer's disease with late onset: Secondary | ICD-10-CM

## 2019-07-14 DIAGNOSIS — I10 Essential (primary) hypertension: Secondary | ICD-10-CM

## 2019-07-14 DIAGNOSIS — L989 Disorder of the skin and subcutaneous tissue, unspecified: Secondary | ICD-10-CM | POA: Diagnosis not present

## 2019-07-14 NOTE — Progress Notes (Signed)
Location:   [facility] Room Number: 110 Place of Service:  SNF (31) Provider: Meeyah Ovitt X, NP  Kalliopi Coupland X, NP  Patient Care Team: Carsyn Taubman X, NP as PCP - General (Internal Medicine) Donnetta Hail, MD (Rheumatology) Ernesto Rutherford, MD (Ophthalmology) Nita Sells, MD (Dermatology) Crist Fat, MD as Attending Physician (Urology) Apolinar Bero X, NP as Nurse Practitioner (Internal Medicine)  Extended Emergency Contact Information Primary Emergency Contact: McClanahan,Susan Address: [address] [place], [place] [address] Darden [place] of [place] Home Phone: [phone] Mobile Phone: [phone] Relation: Daughter Secondary Emergency Contact: Gardiner Sleeper [place] of [place] Home Phone: [phone] Relation: Daughter  Code Status:  DNR Goals of care: Advanced Directive information Advanced Directives 06/05/2019  Does Patient Have a Medical Advance Directive? Yes  Type of Advance Directive Out of facility DNR (pink MOST or yellow form)  Does patient want to make changes to medical advance directive? No - Patient declined  Copy of Healthcare Power of Attorney in Chart? -  Would patient like information on creating a medical advance directive? -  Pre-existing out of facility DNR order (yellow form or pink MOST form) Yellow form placed in chart (order not valid for inpatient use)     Chief Complaint  Patient presents with   Acute Visit    Irregular bump under right eye    HPI:  Pt is a 85 y.o. female seen today for an acute visit for an irregular bump under right eye. The patient was reported rubbing the area. Ocu Soft lid scrub daily for blepharitis. HPI was provided with assistance of staff. The patient resides in memory care unit [facility] for safety, care assistance. HTN, blood pressure is controlled on Atenolol  qd.    Past Medical History:  Diagnosis Date   Acute bronchitis 06/27/2015   Alzheimer's dementia (HCC)      Cataract    Cellulitis of left leg    Depression    Eczema    High cholesterol    History of renal stone    excision   Hypertension    Mild cognitive impairment    Osteoarthritis    Polymyalgia rheumatica (HCC)    rx with prednisone   Urinary incontinence    Varicose veins    Xerosis of skin    Past Surgical History:  Procedure Laterality Date   cataract surgery     EYE SURGERY     JOINT REPLACEMENT Bilateral    Dr. Trudee Grip   kidney stone surgey     lipoma removal      Allergies  Allergen Reactions   Donepezil Other (See Comments)    BAD DREAMS   Namenda [Memantine]    Remeron [Mirtazapine] Other (See Comments)    Dizzy     Allergies as of 07/14/2019      Reactions   Donepezil Other (See Comments)   BAD DREAMS   Namenda [memantine]    Remeron [mirtazapine] Other (See Comments)   Dizzy       Medication List       Accurate as of July 14, 2019  2:46 PM. If you have any questions, ask your nurse or doctor.        STOP taking these medications   LORazepam 0.5 MG tablet Commonly known as: ATIVAN Stopped by: Lilya Smitherman X Taunia Frasco, NP   QUEtiapine 12.5 mg Tabs tablet Commonly known as: SEROQUEL Stopped by: Lashe Oliveira X  Camil Hausmann, NP     TAKE these medications   acetaminophen 500 MG tablet Commonly known as: TYLENOL Take 2 tablets (1,000 mg total) by mouth every 8 (eight) hours as needed.   atenolol 25 MG tablet Commonly known as: TENORMIN Take 25 mg by mouth daily.   calcium carbonate 1500 (600 Ca) MG Tabs tablet Commonly known as: OSCAL Take 600 mg of elemental calcium by mouth 2 (two) times daily with a meal.   cholecalciferol 25 MCG (1000 UT) tablet Commonly known as: VITAMIN D Take 1,000 Units by mouth daily.   Fish Oil 1200 MG Caps Take 1 capsule by mouth daily.   hydrocortisone cream 1 % Apply 1 application topically 2 (two) times daily as needed. Apply to the chest   OCUSOFT BABY EYELID & EYELASH EX Apply topically. 1 PAD  BOTH EYES; CLEAN THE LIDS 2 TIMES A DAY   predniSONE 1 MG tablet Commonly known as: DELTASONE Take 3 mg by mouth daily.   sertraline 100 MG tablet Commonly known as: ZOLOFT Take 100 mg by mouth daily. Take with 25 mg = 125 mg.   sertraline 25 MG tablet Commonly known as: ZOLOFT Take 25 mg by mouth daily. 100 mg +25 mg= 125 mg   therapeutic multivitamin-minerals tablet Take 1 tablet by mouth daily.   THERATEARS OP Apply 1 drop to eye 4 (four) times daily. Both eyes   UNABLE TO FIND Med Name: 120 SAL AC 30% in Petrolatum cream. Apply to right and left cheek dark spots at bedtime.      ROS was provided with assistance of staff.  Review of Systems  Constitutional: Negative for appetite change, chills, diaphoresis, fatigue and fever.  HENT: Negative for congestion and voice change.   Eyes: Negative for pain, discharge, itching and visual disturbance.       Crusted eyelashes.   Respiratory: Negative for cough and wheezing.   Cardiovascular: Positive for leg swelling.  Gastrointestinal: Negative for abdominal distention, abdominal pain, constipation, diarrhea, nausea and vomiting.  Genitourinary: Negative for difficulty urinating, dysuria and urgency.  Musculoskeletal: Positive for arthralgias, gait problem and myalgias.  Skin: Positive for wound.       A pea sized AK appearance skin lesion is scratched, no s/s of infection, on right face, below the right eye.   Neurological: Negative for dizziness, speech difficulty, weakness and headaches.       Dementia.   Psychiatric/Behavioral: Negative for agitation, behavioral problems, hallucinations and sleep disturbance. The patient is not nervous/anxious.     Immunization History  Administered Date(s) Administered   Influenza Whole 05/28/2012, 06/01/2018   Influenza, High Dose Seasonal PF 06/08/2016   Influenza-Unspecified 05/28/2014, 08/28/2014, 05/27/2015, 06/18/2017   Pneumococcal Conjugate-13 01/21/2014   Pneumococcal  Polysaccharide-23 04/16/2015   Td 09/29/2003   Zoster 01/14/2011   Pertinent  Health Maintenance Due  Topic Date Due   INFLUENZA VACCINE  03/29/2019   DEXA SCAN  Completed   PNA vac Low Risk Adult  Completed   Fall Risk  05/17/2018 05/10/2017 05/01/2017 07/17/2016 06/09/2015  Falls in the past year? No No No Yes No  Comment - - - tripped over the last stair in the house -  Number falls in past yr: - - - 1 -   Functional Status Survey:    Vitals:   07/14/19 1249  BP: 132/88  Pulse: (!) 57  Resp: 20  Temp: (!) 97.1 F (36.2 C)  SpO2: 96%  Weight: 174 lb 6.4 oz (79.1 kg)  Height:  5\' 6"  (1.676 m)   Body mass index is 28.15 kg/m. Physical Exam Vitals signs and nursing note reviewed.  Constitutional:      General: She is not in acute distress.    Appearance: Normal appearance. She is not ill-appearing, toxic-appearing or diaphoretic.  HENT:     Head: Normocephalic.     Nose: Nose normal.     Mouth/Throat:     Mouth: Mucous membranes are moist.  Eyes:     Extraocular Movements: Extraocular movements intact.     Conjunctiva/sclera: Conjunctivae normal.     Pupils: Pupils are equal, round, and reactive to light.     Comments: Slightly crusted eyelashes.   Neck:     Musculoskeletal: Normal range of motion and neck supple.  Cardiovascular:     Rate and Rhythm: Normal rate and regular rhythm.     Heart sounds: No murmur.  Pulmonary:     Breath sounds: No wheezing, rhonchi or rales.  Abdominal:     General: Bowel sounds are normal. There is no distension.     Palpations: Abdomen is soft.     Tenderness: There is no abdominal tenderness. There is no right CVA tenderness, left CVA tenderness, guarding or rebound.  Musculoskeletal:     Right lower leg: Edema present.     Left lower leg: Edema present.     Comments: Trace edema BLE  Skin:    General: Skin is warm and dry.     Findings: Lesion present.     Comments: A pea sized AK appearance skin lesion is scratched, no  s/s of infection, on right face, below the right eye.    Neurological:     General: No focal deficit present.     Mental Status: She is alert. Mental status is at baseline.     Motor: No weakness.     Coordination: Coordination normal.     Gait: Gait abnormal.     Comments: Oriented to self.   Psychiatric:        Mood and Affect: Mood normal.        Behavior: Behavior normal.     Labs reviewed: Recent Labs    11/21/18 12/10/18 03/25/19 05/08/19  NA 137 141 139 143  K 4.4 3.7 4.2 3.7  CL 102  --  102 105  CO2 22  --  28 31  BUN 27* 24* 16 16  CREATININE 0.8 0.6 0.7 0.7  CALCIUM 10.8  --  10.0 9.5   Recent Labs    11/21/18 12/10/18 03/25/19 05/08/19  AST 30 17 22 18   ALT 23 15 16 15   ALKPHOS 74 65 69 76  PROT 7.0  --  6.5 5.6*  ALBUMIN 4.6  --  3.9 3.7   Recent Labs    12/10/18 03/25/19 05/08/19  WBC 7.8 10.9 6.8  HGB 13.7 15.4 13.6  HCT 41 46 41  PLT 220 287 235   Lab Results  Component Value Date   TSH 2.77 04/09/2018   Lab Results  Component Value Date   HGBA1C 5.3 04/26/2017   Lab Results  Component Value Date   CHOL 217 (A) 04/09/2018   HDL 51 04/09/2018   LDLCALC 147 04/09/2018   LDLDIRECT 173.0 08/07/2012   TRIG 86 04/09/2018   CHOLHDL 5 03/20/2016    Significant Diagnostic Results in last 30 days:  No results found.  Assessment/Plan Lesion of skin of face appears AK with scratched injury right face below right eye, no s/s of  infection, referral to dermatology.   Essential hypertension, benign Blood pressure is controlled, continue Atenolol 25mg  qd.   Late onset Alzheimer's disease without behavioral disturbance (HCC) Continue memory care unit [facility] for safety, care assistance.   Blepharitis of both eyes Continue Ocusoft eyelid scrub.      Family/ staff Communication: plan of care reviewed with the patient and charge nurse.   Labs/tests ordered:  none  Time spend 25 minutes.

## 2019-07-14 NOTE — Assessment & Plan Note (Signed)
Blood pressure is controlled, continue Atenolol 25mg qd.  

## 2019-07-14 NOTE — Assessment & Plan Note (Signed)
Continue Ocusoft eyelid scrub.

## 2019-07-14 NOTE — Assessment & Plan Note (Signed)
appears AK with scratched injury right face below right eye, no s/s of infection, referral to dermatology.

## 2019-07-14 NOTE — Assessment & Plan Note (Signed)
Continue memory care unit [facility] for safety, care assistance.  

## 2019-07-22 ENCOUNTER — Non-Acute Institutional Stay (SKILLED_NURSING_FACILITY): Payer: Medicare Other | Admitting: Nurse Practitioner

## 2019-07-22 DIAGNOSIS — F0280 Dementia in other diseases classified elsewhere without behavioral disturbance: Secondary | ICD-10-CM

## 2019-07-22 DIAGNOSIS — F418 Other specified anxiety disorders: Secondary | ICD-10-CM

## 2019-07-22 DIAGNOSIS — M353 Polymyalgia rheumatica: Secondary | ICD-10-CM

## 2019-07-22 DIAGNOSIS — G301 Alzheimer's disease with late onset: Secondary | ICD-10-CM | POA: Diagnosis not present

## 2019-07-22 DIAGNOSIS — I10 Essential (primary) hypertension: Secondary | ICD-10-CM

## 2019-07-23 ENCOUNTER — Encounter: Payer: Self-pay | Admitting: Nurse Practitioner

## 2019-07-23 NOTE — Assessment & Plan Note (Signed)
Blood pressure is controlled, continue Atenolol.  °

## 2019-07-23 NOTE — Progress Notes (Signed)
Location:   SNF [facility] Room Number: 110 Place of Service:  SNF (31) Provider: Randy Castrejon X, NP  Petrice Beedy X, NP  Patient Care Team: Kiana Hollar X, NP as PCP - General (Internal Medicine) Donnetta Hail, MD (Rheumatology) Ernesto Rutherford, MD (Ophthalmology) Nita Sells, MD (Dermatology) Crist Fat, MD as Attending Physician (Urology) Tyliek Timberman X, NP as Nurse Practitioner (Internal Medicine)  Extended Emergency Contact Information Primary Emergency Contact: McClanahan,Susan Address: [address] [place], [place] [address] [place] Home Phone: [phone] Mobile Phone: [phone] Relation: Daughter Secondary Emergency Contact: Gardiner Sleeper [place] Home Phone: [phone] Relation: Daughter  Code Status:  DNR Goals of care: Advanced Directive information Advanced Directives 06/05/2019  Does Patient Have a Medical Advance Directive? Yes  Type of Advance Directive Out of facility DNR (pink MOST or yellow form)  Does patient want to make changes to medical advance directive? No - Patient declined  Copy of Healthcare Power of Attorney in Chart? -  Would patient like information on creating a medical advance directive? -  Pre-existing out of facility DNR order (yellow form or pink MOST form) Yellow form placed in chart (order not valid for inpatient use)     Chief Complaint  Patient presents with  . Medical Management of Chronic Issues    HPI:  Pt is a 85 y.o. female seen today for medical management of chronic diseases.    The patient resides in memory care unit, [facility], adjusted well, knows her room on unit, ambulate with walker. Her mood is stable, on Sertraline 125mg  qd. Polymyalgia rheumatica, stable, on Prednisone 3mg  qd. HTN, blood pressure is controlled on Atenolol 25mg  qd.    Past Medical History:  Diagnosis Date  . Acute bronchitis 06/27/2015  . Alzheimer's dementia (HCC)   . Cataract   .  Cellulitis of left leg   . Depression   . Eczema   . High cholesterol   . History of renal stone    excision  . Hypertension   . Mild cognitive impairment   . Osteoarthritis   . Polymyalgia rheumatica (HCC)    rx with prednisone  . Urinary incontinence   . Varicose veins   . Xerosis of skin    Past Surgical History:  Procedure Laterality Date  . cataract surgery    . EYE SURGERY    . JOINT REPLACEMENT Bilateral    Dr.  . kidney stone surgey    . lipoma removal      Allergies  Allergen Reactions  . Donepezil Other (See Comments)    BAD DREAMS  . Namenda [Memantine]   . Remeron [Mirtazapine] Other (See Comments)    Dizzy     Allergies as of 07/22/2019      Reactions   Donepezil Other (See Comments)   BAD DREAMS   Namenda [memantine]    Remeron [mirtazapine] Other (See Comments)   Dizzy       Medication List       Accurate as of July 22, 2019 11:59 PM. If you have any questions, ask your nurse or doctor.        acetaminophen 500 MG tablet Commonly known as: TYLENOL Take 2 tablets (1,000 mg total) by mouth every 8 (eight) hours as needed.   atenolol 25 MG tablet Commonly known as: TENORMIN Take 25 mg by mouth daily.   calcium carbonate 1500 (600 Ca)  MG Tabs tablet Commonly known as: OSCAL Take 600 mg of elemental calcium by mouth 2 (two) times daily with a meal.   cholecalciferol 25 MCG (1000 UT) tablet Commonly known as: VITAMIN D Take 1,000 Units by mouth daily.   Fish Oil 1200 MG Caps Take 1 capsule by mouth daily.   hydrocortisone cream 1 % Apply 1 application topically 2 (two) times daily as needed. Apply to the chest   OCUSOFT BABY EYELID & EYELASH EX Apply topically. 1 PAD BOTH EYES; CLEAN THE LIDS 2 TIMES A DAY   predniSONE 1 MG tablet Commonly known as: DELTASONE Take 3 mg by mouth daily.   sertraline 100 MG tablet Commonly known as: ZOLOFT Take 100 mg by mouth daily. Take with 25 mg = 125 mg.   sertraline 25 MG  tablet Commonly known as: ZOLOFT Take 25 mg by mouth daily. 100 mg +25 mg= 125 mg   therapeutic multivitamin-minerals tablet Take 1 tablet by mouth daily.   THERATEARS OP Apply 1 drop to eye 4 (four) times daily. Both eyes   UNABLE TO FIND Med Name: 120 SAL AC 30% in Petrolatum cream. Apply to right and left cheek dark spots at bedtime.      ROS was provided with assistance of staff.  Review of Systems  Constitutional: Negative for activity change, appetite change, chills, diaphoresis, fatigue, fever and unexpected weight change.  HENT: Positive for hearing loss. Negative for congestion and voice change.   Respiratory: Negative for cough, shortness of breath and wheezing.   Cardiovascular: Positive for leg swelling. Negative for chest pain and palpitations.  Gastrointestinal: Negative for abdominal distention, abdominal pain, constipation, diarrhea, nausea and vomiting.  Genitourinary: Negative for difficulty urinating, dysuria and urgency.  Musculoskeletal: Positive for arthralgias, gait problem and myalgias.  Skin: Negative for color change and pallor.  Neurological: Negative for dizziness, speech difficulty, weakness and headaches.       Dementia  Psychiatric/Behavioral: Positive for agitation, behavioral problems and confusion. Negative for hallucinations and sleep disturbance. The patient is nervous/anxious.     Immunization History  Administered Date(s) Administered  . Influenza Whole 05/28/2012, 06/01/2018  . Influenza, High Dose Seasonal PF 06/08/2016  . Influenza-Unspecified 05/28/2014, 08/28/2014, 05/27/2015, 06/18/2017  . Pneumococcal Conjugate-13 01/21/2014  . Pneumococcal Polysaccharide-23 04/16/2015  . Td 09/29/2003  . Zoster 01/14/2011   Pertinent  Health Maintenance Due  Topic Date Due  . INFLUENZA VACCINE  Completed  . DEXA SCAN  Completed  . PNA vac Low Risk Adult  Completed   Fall Risk  05/17/2018 05/10/2017 05/01/2017 07/17/2016 06/09/2015  Falls in the  past year? No No No Yes No  Comment - - - tripped over the last stair in the house -  Number falls in past yr: - - - 1 -   Functional Status Survey:    Vitals:   07/23/19 1330  BP: 138/80  Pulse: 64  Resp: 20  Temp: (!) 97.3 F (36.3 C)  SpO2: 95%  Weight: 174 lb 6.4 oz (79.1 kg)  Height: 5\' 6"  (1.676 m)   Body mass index is 28.15 kg/m. Physical Exam Vitals signs and nursing note reviewed.  Constitutional:      General: She is not in acute distress.    Appearance: Normal appearance. She is not ill-appearing, toxic-appearing or diaphoretic.  HENT:     Head: Normocephalic and atraumatic.     Nose: Nose normal.     Mouth/Throat:     Mouth: Mucous membranes are moist.  Eyes:  Extraocular Movements: Extraocular movements intact.     Conjunctiva/sclera: Conjunctivae normal.     Pupils: Pupils are equal, round, and reactive to light.  Neck:     Musculoskeletal: Normal range of motion and neck supple.  Cardiovascular:     Rate and Rhythm: Normal rate and regular rhythm.     Heart sounds: No murmur.  Pulmonary:     Breath sounds: No wheezing, rhonchi or rales.  Abdominal:     General: Bowel sounds are normal. There is no distension.     Palpations: Abdomen is soft.     Tenderness: There is no abdominal tenderness. There is no right CVA tenderness, left CVA tenderness, guarding or rebound.  Musculoskeletal:     Right lower leg: Edema present.     Left lower leg: Edema present.     Comments: Trace edema BLE  Skin:    General: Skin is warm and dry.     Findings: Lesion present.     Comments: Right face  Neurological:     General: No focal deficit present.     Mental Status: She is alert. Mental status is at baseline.     Motor: No weakness.     Coordination: Coordination normal.     Gait: Gait abnormal.     Comments: Oriented to self.   Psychiatric:     Comments: Pleasantly confused.      Labs reviewed: Recent Labs    11/21/18 12/10/18 03/25/19 05/08/19  NA  137 141 139 143  K 4.4 3.7 4.2 3.7  CL 102  --  102 105  CO2 22  --  28 31  BUN 27* 24* 16 16  CREATININE 0.8 0.6 0.7 0.7  CALCIUM 10.8  --  10.0 9.5   Recent Labs    11/21/18 12/10/18 03/25/19 05/08/19  AST 30 17 22 18   ALT 23 15 16 15   ALKPHOS 74 65 69 76  PROT 7.0  --  6.5 5.6*  ALBUMIN 4.6  --  3.9 3.7   Recent Labs    12/10/18 03/25/19 05/08/19  WBC 7.8 10.9 6.8  HGB 13.7 15.4 13.6  HCT 41 46 41  PLT 220 287 235   Lab Results  Component Value Date   TSH 2.77 04/09/2018   Lab Results  Component Value Date   HGBA1C 5.3 04/26/2017   Lab Results  Component Value Date   CHOL 217 (A) 04/09/2018   HDL 51 04/09/2018   LDLCALC 147 04/09/2018   LDLDIRECT 173.0 08/07/2012   TRIG 86 04/09/2018   CHOLHDL 5 03/20/2016    Significant Diagnostic Results in last 30 days:  No results found.  Assessment/Plan Essential hypertension, benign Blood pressure is controlled, continue Atenolol.   Late onset Alzheimer's disease without behavioral disturbance ([place]) Pleasantly confused, occasional emotional outburst mostly related lack of understanding of her surroundings. Continue memory care unit [facility] for safety, care assistance.   Polymyalgia rheumatica (HCC) Stable, continue Prednisone 3mg  qd.   Depression with anxiety Her mood is stabilizing, continue Sertraline, off Lorazepam     Family/ staff Communication: plan of care reviewed with the patient and charge nurse.   Labs/tests ordered:  none  Time spend 25 minutes.

## 2019-07-23 NOTE — Assessment & Plan Note (Signed)
Pleasantly confused, occasional emotional outburst mostly related lack of understanding of her surroundings. Continue memory care unit [facility] for safety, care assistance.

## 2019-07-23 NOTE — Assessment & Plan Note (Signed)
Stable, continue Prednisone 3mg qd.  

## 2019-07-23 NOTE — Assessment & Plan Note (Signed)
Her mood is stabilizing, continue Sertraline, off Lorazepam

## 2019-08-11 ENCOUNTER — Non-Acute Institutional Stay (SKILLED_NURSING_FACILITY): Payer: Medicare Other | Admitting: Nurse Practitioner

## 2019-08-11 ENCOUNTER — Encounter: Payer: Self-pay | Admitting: Nurse Practitioner

## 2019-08-11 DIAGNOSIS — M353 Polymyalgia rheumatica: Secondary | ICD-10-CM

## 2019-08-11 DIAGNOSIS — G301 Alzheimer's disease with late onset: Secondary | ICD-10-CM | POA: Diagnosis not present

## 2019-08-11 DIAGNOSIS — F0280 Dementia in other diseases classified elsewhere without behavioral disturbance: Secondary | ICD-10-CM

## 2019-08-11 DIAGNOSIS — F339 Major depressive disorder, recurrent, unspecified: Secondary | ICD-10-CM | POA: Diagnosis not present

## 2019-08-11 DIAGNOSIS — I10 Essential (primary) hypertension: Secondary | ICD-10-CM | POA: Diagnosis not present

## 2019-08-11 NOTE — Assessment & Plan Note (Signed)
Adjusting to memory care unit [facility] well, continue ambulating with walker.

## 2019-08-11 NOTE — Assessment & Plan Note (Signed)
Her mood is stable, off prn Lorazepam, continue Sertraline. Observe.

## 2019-08-11 NOTE — Progress Notes (Signed)
Location:   [facility] Room Number: 110 Place of Service:  SNF (31) Provider: Cierrah Dace X, NP  Norlan Rann X, NP  Patient Care Team: Jessaca Philippi X, NP as PCP - General (Internal Medicine) Donnetta HailBeekman, James F, MD (Rheumatology) Ernesto RutherfordGroat, Robert, MD (Ophthalmology) Nita SellsHall, John, MD (Dermatology) Crist FatHerrick, Benjamin W, MD as Attending Physician (Urology) Nari Vannatter X, NP as Nurse Practitioner (Internal Medicine)  Extended Emergency Contact Information Primary Emergency Contact: McClanahan,Susan Address: [address]          [place], [place] [address] [place] Home Phone: [phone] Mobile Phone: [phone] Relation: Daughter Secondary Emergency Contact: Gardiner SleeperBaker,Carolanne  [place] Home Phone: [phone] Relation: Daughter  Code Status:  DNR Goals of care: Advanced Directive information Advanced Directives 06/05/2019  Does Patient Have a Medical Advance Directive? Yes  Type of Advance Directive Out of facility DNR (pink MOST or yellow form)  Does patient want to make changes to medical advance directive? No - Patient declined  Copy of Healthcare Power of Attorney in Chart? -  Would patient like information on creating a medical advance directive? -  Pre-existing out of facility DNR order (yellow form or pink MOST form) Yellow form placed in chart (order not valid for inpatient use)     Chief Complaint  Patient presents with  . Medical Management of Chronic Issues    HPI:  Pt is a 85 y.o. female seen today for medical management of chronic diseases.    [facility] care assistance, ambulates with walker. Her mood is stable, on Sertraline 125mg  qd. Polymyalgia rheumatica, stable, on Prednisone 3mg  qd, prn Tylenol. HTN, blood pressure is controlled on Atenolol 25mg  qd.   Past Medical History:  Diagnosis Date  . Acute bronchitis 06/27/2015  . Alzheimer's dementia (HCC)   .  Cataract   . Cellulitis of left leg   . Depression   . Eczema   . High cholesterol   . History of renal stone    excision  . Hypertension   . Mild cognitive impairment   . Osteoarthritis   . Polymyalgia rheumatica (HCC)    rx with prednisone  . Urinary incontinence   . Varicose veins   . Xerosis of skin    Past Surgical History:  Procedure Laterality Date  . cataract surgery    . EYE SURGERY    . JOINT REPLACEMENT Bilateral    Dr. Trudee GripFrank Alusio  . kidney stone surgey    . lipoma removal      Allergies  Allergen Reactions  . Donepezil Other (See Comments)    BAD DREAMS  . Namenda [Memantine]   . Remeron [Mirtazapine] Other (See Comments)    Dizzy     Allergies as of 08/11/2019      Reactions   Donepezil Other (See Comments)   BAD DREAMS   Namenda [memantine]    Remeron [mirtazapine] Other (See Comments)   Dizzy       Medication List       Accurate as of August 11, 2019 12:51 PM. If you have any questions, ask your nurse or doctor.        acetaminophen 500 MG tablet Commonly known as: TYLENOL Take 2 tablets (1,000 mg total) by mouth every 8 (eight) hours as needed.   atenolol 25 MG tablet Commonly known as: TENORMIN Take 25 mg by mouth daily.   calcium carbonate 1500 (600 Ca) MG  Tabs tablet Commonly known as: OSCAL Take 600 mg of elemental calcium by mouth 2 (two) times daily with a meal.   cholecalciferol 25 MCG (1000 UT) tablet Commonly known as: VITAMIN D Take 1,000 Units by mouth daily.   Fish Oil 1200 MG Caps Take 1 capsule by mouth daily.   hydrocortisone cream 1 % Apply 1 application topically 2 (two) times daily as needed. Apply to the chest   OCUSOFT BABY EYELID & EYELASH EX Apply topically. 1 PAD BOTH EYES; CLEAN THE LIDS 2 TIMES A DAY   predniSONE 1 MG tablet Commonly known as: DELTASONE Take 3 mg by mouth daily.   sertraline 100 MG tablet Commonly known as: ZOLOFT Take 100 mg by mouth daily. Take with 25 mg = 125 mg.     sertraline 25 MG tablet Commonly known as: ZOLOFT Take 25 mg by mouth daily. 100 mg +25 mg= 125 mg   therapeutic multivitamin-minerals tablet Take 1 tablet by mouth daily.   THERATEARS OP Apply 1 drop to eye 4 (four) times daily. Both eyes   UNABLE TO FIND Med Name: 120 SAL AC 30% in Petrolatum cream. Apply to right and left cheek dark spots at bedtime.      ROS was provided with assistance of staff.  Review of Systems  Constitutional: Negative for activity change, appetite change, chills, diaphoresis, fatigue and fever.  HENT: Positive for hearing loss. Negative for congestion and voice change.   Respiratory: Negative for cough, shortness of breath and wheezing.   Cardiovascular: Positive for leg swelling.  Gastrointestinal: Negative for abdominal distention, abdominal pain, constipation, diarrhea, nausea and vomiting.  Genitourinary: Negative for difficulty urinating, dysuria and urgency.  Musculoskeletal: Positive for arthralgias, gait problem and myalgias.  Skin: Negative for color change and pallor.  Neurological: Negative for dizziness, speech difficulty, weakness and headaches.       Dementia  Psychiatric/Behavioral: Positive for confusion. Negative for agitation, behavioral problems, hallucinations and sleep disturbance. The patient is not nervous/anxious.     Immunization History  Administered Date(s) Administered  . Influenza Whole 05/28/2012, 06/01/2018  . Influenza, High Dose Seasonal PF 06/08/2016  . Influenza-Unspecified 05/28/2014, 08/28/2014, 05/27/2015, 06/18/2017  . Pneumococcal Conjugate-13 01/21/2014  . Pneumococcal Polysaccharide-23 04/16/2015  . Td 09/29/2003  . Zoster 01/14/2011   Pertinent  Health Maintenance Due  Topic Date Due  . INFLUENZA VACCINE  Completed  . DEXA SCAN  Completed  . PNA vac Low Risk Adult  Completed   Fall Risk  05/17/2018 05/10/2017 05/01/2017 07/17/2016 06/09/2015  Falls in the past year? No No No Yes No  Comment - - -  tripped over the last stair in the house -  Number falls in past yr: - - - 1 -   Functional Status Survey:    Vitals:   08/11/19 1039  BP: 130/80  Pulse: 64  Resp: 20  Temp: (!) 97.3 F (36.3 C)  SpO2: 95%  Weight: 171 lb 12.8 oz (77.9 kg)  Height: 5\' 6"  (1.676 m)   Body mass index is 27.73 kg/m. Physical Exam Vitals and nursing note reviewed.  Constitutional:      General: She is not in acute distress.    Appearance: Normal appearance. She is not ill-appearing, toxic-appearing or diaphoretic.  HENT:     Head: Normocephalic and atraumatic.     Nose: Nose normal.     Mouth/Throat:     Mouth: Mucous membranes are moist.  Eyes:     Extraocular Movements: Extraocular movements intact.  Conjunctiva/sclera: Conjunctivae normal.     Pupils: Pupils are equal, round, and reactive to light.  Cardiovascular:     Rate and Rhythm: Normal rate and regular rhythm.     Heart sounds: No murmur.  Pulmonary:     Breath sounds: No wheezing, rhonchi or rales.  Abdominal:     General: Bowel sounds are normal. There is no distension.     Palpations: Abdomen is soft.     Tenderness: There is no abdominal tenderness. There is no right CVA tenderness, left CVA tenderness, guarding or rebound.  Musculoskeletal:     Cervical back: Normal range of motion and neck supple.     Right lower leg: Edema present.     Left lower leg: Edema present.     Comments: Trace edema BLE  Skin:    General: Skin is warm and dry.     Comments: Chronic venous insufficiency skin changes BLE  Neurological:     General: No focal deficit present.     Mental Status: She is alert. Mental status is at baseline.     Motor: No weakness.     Coordination: Coordination normal.     Gait: Gait abnormal.     Comments: Oriented to self.   Psychiatric:        Mood and Affect: Mood normal.        Behavior: Behavior normal.     Labs reviewed: Recent Labs    11/21/18 0000 12/10/18 0000 03/25/19 0000 05/08/19 0000   NA 137 141 139 143  K 4.4 3.7 4.2 3.7  CL 102  --  102 105  CO2 22  --  28 31  BUN 27* 24* 16 16  CREATININE 0.8 0.6 0.7 0.7  CALCIUM 10.8  --  10.0 9.5   Recent Labs    11/21/18 0000 12/10/18 0000 03/25/19 0000 05/08/19 0000  AST 30 17 22 18   ALT 23 15 16 15   ALKPHOS 74 65 69 76  PROT 7.0  --  6.5 5.6*  ALBUMIN 4.6  --  3.9 3.7   Recent Labs    12/10/18 0000 03/25/19 0000 05/08/19 0000  WBC 7.8 10.9 6.8  HGB 13.7 15.4 13.6  HCT 41 46 41  PLT 220 287 235   Lab Results  Component Value Date   TSH 2.77 04/09/2018   Lab Results  Component Value Date   HGBA1C 5.3 04/26/2017   Lab Results  Component Value Date   CHOL 217 (A) 04/09/2018   HDL 51 04/09/2018   LDLCALC 147 04/09/2018   LDLDIRECT 173.0 08/07/2012   TRIG 86 04/09/2018   CHOLHDL 5 03/20/2016    Significant Diagnostic Results in last 30 days:  No results found.  Assessment/Plan Essential hypertension, benign Blood pressure is controlled, continue Atenolol.   Late onset Alzheimer's disease without behavioral disturbance (HCC) Adjusting to memory care unit [facility] well, continue ambulating with walker.   Polymyalgia rheumatica (HCC) Stable, continue Prednisone 3mg  qd, prn Tylenol.   Depression, recurrent (HCC) Her mood is stable, off prn Lorazepam, continue Sertraline. Observe.      Family/ staff Communication: plan of care reviewed with the patient and charge nurse.   Labs/tests ordered:  none  Time spend 25 minutes.

## 2019-08-11 NOTE — Assessment & Plan Note (Signed)
Blood pressure is controlled, continue Atenolol.  °

## 2019-08-11 NOTE — Assessment & Plan Note (Signed)
Stable, continue Prednisone 3mg qd, prn Tylenol.  

## 2019-08-13 ENCOUNTER — Non-Acute Institutional Stay (SKILLED_NURSING_FACILITY): Payer: Medicare Other | Admitting: Nurse Practitioner

## 2019-08-13 ENCOUNTER — Encounter: Payer: Self-pay | Admitting: Nurse Practitioner

## 2019-08-13 DIAGNOSIS — M353 Polymyalgia rheumatica: Secondary | ICD-10-CM

## 2019-08-13 DIAGNOSIS — F339 Major depressive disorder, recurrent, unspecified: Secondary | ICD-10-CM

## 2019-08-13 DIAGNOSIS — T148XXA Other injury of unspecified body region, initial encounter: Secondary | ICD-10-CM

## 2019-08-13 DIAGNOSIS — G301 Alzheimer's disease with late onset: Secondary | ICD-10-CM | POA: Diagnosis not present

## 2019-08-13 DIAGNOSIS — L089 Local infection of the skin and subcutaneous tissue, unspecified: Secondary | ICD-10-CM | POA: Insufficient documentation

## 2019-08-13 DIAGNOSIS — F0280 Dementia in other diseases classified elsewhere without behavioral disturbance: Secondary | ICD-10-CM

## 2019-08-13 NOTE — Assessment & Plan Note (Signed)
Stable, continue Prednisone.  

## 2019-08-13 NOTE — Assessment & Plan Note (Signed)
Her mood is stable, but still has occasional combative behaviors in setting of dementia, will delay GDR of Sertraline.

## 2019-08-13 NOTE — Assessment & Plan Note (Signed)
Advanced stage, continue Memory care unit [facility] for safety, care assistance, the patient refuses ADL assistance often, she can be combative at times.

## 2019-08-13 NOTE — Progress Notes (Addendum)
Location:   SNF [facility] Room Number: 110 Place of Service:  SNF (31) Provider: Arna Snipe Kearston Putman NP  Raul Winterhalter X, NP  Patient Care Team: Delanna Blacketer X, NP as PCP - General (Internal Medicine) Donnetta Hail, MD (Rheumatology) Ernesto Rutherford, MD (Ophthalmology) Nita Sells, MD (Dermatology) Crist Fat, MD as Attending Physician (Urology) Cyrus Ramsburg X, NP as Nurse Practitioner (Internal Medicine)  Extended Emergency Contact Information Primary Emergency Contact: McClanahan,Susan Address: [address] [place], [place] [address] [place] of [place] Home Phone: [phone] Mobile Phone: [phone] Relation: Daughter Secondary Emergency Contact: Gardiner Sleeper [place] of [place] Home Phone: [phone] Relation: Daughter  Code Status: DNR Goals of care: Advanced Directive information Advanced Directives 06/05/2019  Does Patient Have a Medical Advance Directive? Yes  Type of Advance Directive Out of facility DNR (pink MOST or yellow form)  Does patient want to make changes to medical advance directive? No - Patient declined  Copy of Healthcare Power of Attorney in Chart? -  Would patient like information on creating a medical advance directive? -  Pre-existing out of facility DNR order (yellow form or pink MOST form) Yellow form placed in chart (order not valid for inpatient use)     Chief Complaint  Patient presents with  . Acute Visit    left ankle and back of the left hand bruise    HPI:  Pt is a 85 y.o. female seen today for an acute visit for bruise in the left lower leg above the ankle area and back of the left hand, pain when palpated, no pain with ROM or weight bearing. The patient has frail skin with multiple ecchymoses in all limbs. Not certain of the etiology. HPI was provided with assistance of staff. The patient resides in Memory care unit [facility], refuses ADL assistance and can not combative at times, she had shower last  night which she refuses often. PMR, stable, on Prednisone 3mg  qd. Her mood is managed on Sertraline 125mg  qd.    Past Medical History:  Diagnosis Date  . Acute bronchitis 06/27/2015  . Alzheimer's dementia (HCC)   . Cataract   . Cellulitis of left leg   . Depression   . Eczema   . High cholesterol   . History of renal stone    excision  . Hypertension   . Mild cognitive impairment   . Osteoarthritis   . Polymyalgia rheumatica (HCC)    rx with prednisone  . Urinary incontinence   . Varicose veins   . Xerosis of skin    Past Surgical History:  Procedure Laterality Date  . cataract surgery    . EYE SURGERY    . JOINT REPLACEMENT Bilateral    Dr.  . kidney stone surgey    . lipoma removal      Allergies  Allergen Reactions  . Donepezil Other (See Comments)    BAD DREAMS  . Namenda [Memantine]   . Remeron [Mirtazapine] Other (See Comments)    Dizzy     Allergies as of 08/13/2019      Reactions   Donepezil Other (See Comments)   BAD DREAMS   Namenda [memantine]    Remeron [mirtazapine] Other (See Comments)   Dizzy       Medication List       Accurate as of August 13, 2019  1:42 PM. If you have any questions, ask your nurse or doctor.  acetaminophen 500 MG tablet Commonly known as: TYLENOL Take 2 tablets (1,000 mg total) by mouth every 8 (eight) hours as needed.   atenolol 25 MG tablet Commonly known as: TENORMIN Take 25 mg by mouth daily.   calcium carbonate 1500 (600 Ca) MG Tabs tablet Commonly known as: OSCAL Take 600 mg of elemental calcium by mouth 2 (two) times daily with a meal.   cholecalciferol 25 MCG (1000 UT) tablet Commonly known as: VITAMIN D Take 1,000 Units by mouth daily.   Fish Oil 1200 MG Caps Take 1 capsule by mouth daily.   hydrocortisone cream 1 % Apply 1 application topically 2 (two) times daily as needed. Apply to the chest   OCUSOFT BABY EYELID & EYELASH EX Apply topically. 1 PAD BOTH EYES; CLEAN  THE LIDS 2 TIMES A DAY   predniSONE 1 MG tablet Commonly known as: DELTASONE Take 3 mg by mouth daily.   sertraline 100 MG tablet Commonly known as: ZOLOFT Take 100 mg by mouth daily. Take with 25 mg = 125 mg.   sertraline 25 MG tablet Commonly known as: ZOLOFT Take 25 mg by mouth daily. 100 mg +25 mg= 125 mg   therapeutic multivitamin-minerals tablet Take 1 tablet by mouth daily.   THERATEARS OP Apply 1 drop to eye 4 (four) times daily. Both eyes   UNABLE TO FIND Med Name: 120 SAL AC 30% in Petrolatum cream. Apply to right and left cheek dark spots at bedtime.      ROS was provided with assistance of staff Review of Systems  Constitutional: Negative for activity change, appetite change, chills, diaphoresis, fatigue and fever.  HENT: Positive for hearing loss. Negative for congestion, trouble swallowing and voice change.   Respiratory: Negative for cough, shortness of breath and wheezing.   Cardiovascular: Positive for leg swelling. Negative for chest pain and palpitations.  Gastrointestinal: Negative for abdominal distention, abdominal pain, constipation, diarrhea, nausea and vomiting.  Genitourinary: Negative for difficulty urinating, dysuria and urgency.  Musculoskeletal: Positive for arthralgias, gait problem and myalgias.  Skin: Positive for color change.  Neurological: Negative for dizziness, speech difficulty, weakness and headaches.       Dementia  Psychiatric/Behavioral: Positive for agitation, behavioral problems and confusion. Negative for hallucinations and sleep disturbance. The patient is not nervous/anxious.     Immunization History  Administered Date(s) Administered  . Influenza Whole 05/28/2012, 06/01/2018  . Influenza, High Dose Seasonal PF 06/08/2016  . Influenza-Unspecified 05/28/2014, 08/28/2014, 05/27/2015, 06/18/2017  . Pneumococcal Conjugate-13 01/21/2014  . Pneumococcal Polysaccharide-23 04/16/2015  . Td 09/29/2003  . Zoster 01/14/2011    Pertinent  Health Maintenance Due  Topic Date Due  . INFLUENZA VACCINE  Completed  . DEXA SCAN  Completed  . PNA vac Low Risk Adult  Completed   Fall Risk  05/17/2018 05/10/2017 05/01/2017 07/17/2016 06/09/2015  Falls in the past year? No No No Yes No  Comment - - - tripped over the last stair in the house -  Number falls in past yr: - - - 1 -   Functional Status Survey:    Vitals:   08/13/19 1149  BP: (!) 144/80  Pulse: 70  Resp: 18  Temp: (!) 97.1 F (36.2 C)  SpO2: 94%   There is no height or weight on file to calculate BMI. Physical Exam Vitals and nursing note reviewed.  Constitutional:      General: She is not in acute distress.    Appearance: Normal appearance. She is not ill-appearing, toxic-appearing or diaphoretic.  HENT:     Head: Normocephalic and atraumatic.     Nose: Nose normal.     Mouth/Throat:     Mouth: Mucous membranes are moist.  Eyes:     Extraocular Movements: Extraocular movements intact.     Conjunctiva/sclera: Conjunctivae normal.     Pupils: Pupils are equal, round, and reactive to light.  Cardiovascular:     Rate and Rhythm: Normal rate and regular rhythm.     Heart sounds: No murmur.  Pulmonary:     Breath sounds: No wheezing, rhonchi or rales.  Abdominal:     General: Bowel sounds are normal. There is no distension.     Palpations: Abdomen is soft.     Tenderness: There is no abdominal tenderness. There is no right CVA tenderness, left CVA tenderness, guarding or rebound.  Musculoskeletal:     Cervical back: Normal range of motion and neck supple.     Right lower leg: Edema present.     Left lower leg: Edema present.     Comments: Trace edema BLE  Skin:    General: Skin is warm and dry.     Findings: Bruising present.     Comments: About 2 inch band in the left lower leg above the ankle, a half of the back of the left hand size bruise noted, pain when touched, no pain with ROM or weight bearing.   Neurological:     General: No  focal deficit present.     Mental Status: She is alert. Mental status is at baseline.     Cranial Nerves: No cranial nerve deficit.     Motor: No weakness.     Coordination: Coordination normal.     Gait: Gait abnormal.     Comments: Oriented to self.   Psychiatric:     Comments: Confused, but follows simple directions, answer simple questions.      Labs reviewed: Recent Labs    11/21/18 0000 12/10/18 0000 03/25/19 0000 05/08/19 0000  NA 137 141 139 143  K 4.4 3.7 4.2 3.7  CL 102  --  102 105  CO2 22  --  28 31  BUN 27* 24* 16 16  CREATININE 0.8 0.6 0.7 0.7  CALCIUM 10.8  --  10.0 9.5   Recent Labs    11/21/18 0000 12/10/18 0000 03/25/19 0000 05/08/19 0000  AST ALT ALKPHOS 74 65 69 76  PROT 7.0  --  6.5 5.6*  ALBUMIN 4.6  --  3.9 3.7   Recent Labs    12/10/18 0000 03/25/19 0000 05/08/19 0000  WBC 7.8 10.9 6.8  HGB 13.7 15.4 13.6  HCT 41 46 41  PLT 220 287 235   Lab Results  Component Value Date   TSH 2.77 04/09/2018   Lab Results  Component Value Date   HGBA1C 5.3 04/26/2017   Lab Results  Component Value Date   CHOL 217 (A) 04/09/2018   HDL 51 04/09/2018   LDLCALC 147 04/09/2018   LDLDIRECT 173.0 08/07/2012   TRIG 86 04/09/2018   CHOLHDL 5 03/20/2016    Significant Diagnostic Results in last 30 days:  No results found.  Assessment/Plan: Bruise Left lower leg above the ankle, back of left hand, probably from bumping into objects during assistance with shower or dressing in setting of her frail skin condition and chronic Prednisone use. Observe.   Polymyalgia rheumatica (HCC) Stable, continue Prednisone  Late onset Alzheimer's disease without  behavioral disturbance (HCC) Advanced stage, continue Memory care unit [facility] for safety, care assistance, the patient refuses ADL assistance often, she can be combative at times.   Depression, recurrent ([place]) Her mood is stable, but still has occasional combative behaviors in  setting of dementia, will delay GDR of Sertraline.     Family/ staff Communication: plan of care reviewed with the patient and charge nurse.   Labs/tests ordered: none  Time spend 25 minutes.

## 2019-08-13 NOTE — Assessment & Plan Note (Addendum)
Left lower leg above the ankle, back of left hand, probably from bumping into objects during assistance with shower or dressing in setting of her frail skin condition and chronic Prednisone use. Observe.

## 2019-09-01 DIAGNOSIS — Z03818 Encounter for observation for suspected exposure to other biological agents ruled out: Secondary | ICD-10-CM | POA: Diagnosis not present

## 2019-09-02 ENCOUNTER — Encounter: Payer: Self-pay | Admitting: Internal Medicine

## 2019-09-02 ENCOUNTER — Non-Acute Institutional Stay (SKILLED_NURSING_FACILITY): Payer: Medicare PPO | Admitting: Internal Medicine

## 2019-09-02 DIAGNOSIS — M353 Polymyalgia rheumatica: Secondary | ICD-10-CM | POA: Diagnosis not present

## 2019-09-02 DIAGNOSIS — I872 Venous insufficiency (chronic) (peripheral): Secondary | ICD-10-CM | POA: Diagnosis not present

## 2019-09-02 DIAGNOSIS — I10 Essential (primary) hypertension: Secondary | ICD-10-CM | POA: Diagnosis not present

## 2019-09-02 DIAGNOSIS — F0280 Dementia in other diseases classified elsewhere without behavioral disturbance: Secondary | ICD-10-CM | POA: Diagnosis not present

## 2019-09-02 DIAGNOSIS — F418 Other specified anxiety disorders: Secondary | ICD-10-CM

## 2019-09-02 DIAGNOSIS — G301 Alzheimer's disease with late onset: Secondary | ICD-10-CM

## 2019-09-02 NOTE — Progress Notes (Signed)
Location:   [place] Room Number: 110 Place of Service:  SNF [phone]) Provider:  Veleta Miners MD  Mast, Man X, NP  Patient Care Team: Mast, Man X, NP as PCP - General (Internal Medicine) Hennie Duos, MD (Rheumatology) Clent Jacks, MD (Ophthalmology) Allyn Kenner, MD (Dermatology) Ardis Hughs, MD as Attending Physician (Urology) Mast, Man X, NP as Nurse Practitioner (Internal Medicine)  Extended Emergency Contact Information Primary Emergency Contact: McClanahan,Susan Address: [place], [place] [address] John[place]te [place] of [place] Phone: [phone] Mobile Phone: [phone] Relation: Daughter Secondary Emergency Contact: Newman Nip [place] of [place] Phone: [phone] Relation: Daughter  Code Status:  DNR Goals of care: Advanced Directive information Advanced Directives 06/05/2019  Does Patient Have a Medical Advance Directive? Yes  Type of Advance Directive Out of facility DNR (pink MOST or yellow form)  Does patient want to make changes to medical advance directive? No - Patient declined  Copy of [place] in Chart? -  Would patient like information on creating a medical advance directive? -  Pre-existing out of facility DNR order (yellow form or pink MOST form) Yellow form placed in chart (order not valid for inpatient use)     Chief Complaint  Patient presents with  . Medical Management of Chronic Issues    HPI:  Pt is a 86 y.o. female seen today for medical management of chronic diseases.   Patient has h/o Frontal temporal Dementia with aphasia, Hypertension, PMR on Chronic Prednisone, Anxiety with Depression Now is in Memory unit due to her behavior issues including Elopement from the facility She continues to resists care. Refuses Meds sometimes. Did not want me to examine her. Though calmed down later. Has Lost 5-6 lbs No other Nursing Issues Past  Medical History:  Diagnosis Date  . Acute bronchitis 06/27/2015  . Alzheimer's dementia ([place])   . Cataract   . Cellulitis of left leg   . Depression   . Eczema   . High cholesterol   . History of renal stone    excision  . Hypertension   . Mild cognitive impairment   . Osteoarthritis   . Polymyalgia rheumatica (HCC)    rx with prednisone  . Urinary incontinence   . Varicose veins   . Xerosis of skin    Past Surgical History:  Procedure Laterality Date  . cataract surgery    . EYE SURGERY    . JOINT REPLACEMENT Bilateral    Dr. Hector Shade  . kidney stone surgey    . lipoma removal      Allergies  Allergen Reactions  . Donepezil Other (See Comments)    BAD DREAMS  . Namenda [Memantine]   . Remeron [Mirtazapine] Other (See Comments)    Dizzy     Allergies as of 09/02/2019      Reactions   Donepezil Other (See Comments)   BAD DREAMS   Namenda [memantine]    Remeron [mirtazapine] Other (See Comments)   Dizzy       Medication List       Accurate as of September 02, 2019 12:40 PM. If you have any questions, ask your nurse or doctor.        acetaminophen 500 MG tablet Commonly known as: TYLENOL Take 2 tablets (1,000 mg total) by mouth every 8 (eight) hours as needed.   atenolol 25 MG tablet Commonly known as: TENORMIN  Take 25 mg by mouth daily.   calcium carbonate 1500 (600 Ca) MG Tabs tablet Commonly known as: OSCAL Take 600 mg of elemental calcium by mouth 2 (two) times daily with a meal.   cholecalciferol 25 MCG (1000 UT) tablet Commonly known as: VITAMIN D Take 1,000 Units by mouth daily.   Fish Oil 1200 MG Caps Take 1 capsule by mouth daily.   hydrocortisone cream 1 % Apply 1 application topically 2 (two) times daily as needed. Apply to the chest   OCUSOFT BABY EYELID & EYELASH EX Apply topically. 1 PAD BOTH EYES; CLEAN THE LIDS 2 TIMES A DAY   predniSONE 1 MG tablet Commonly known as: DELTASONE Take 3 mg by mouth daily.   sertraline 100  MG tablet Commonly known as: ZOLOFT Take 100 mg by mouth daily. Take with 25 mg = 125 mg.   sertraline 25 MG tablet Commonly known as: ZOLOFT Take 25 mg by mouth daily. 100 mg +25 mg= 125 mg   therapeutic multivitamin-minerals tablet Take 1 tablet by mouth daily.   THERATEARS OP Apply 1 drop to eye 4 (four) times daily. Both eyes   UNABLE TO FIND Med Name: 120 SAL AC 30% in Petrolatum cream. Apply to right and left cheek dark spots at bedtime.       Review of Systems  Unable to perform ROS: Dementia    Immunization History  Administered Date(s) Administered  . Influenza Whole 05/28/2012, 06/01/2018  . Influenza, High Dose Seasonal PF 06/08/2016  . Influenza-Unspecified 05/28/2014, 08/28/2014, 05/27/2015, 06/18/2017  . Pneumococcal Conjugate-13 01/21/2014  . Pneumococcal Polysaccharide-23 04/16/2015  . Td 09/29/2003  . Zoster 01/14/2011   Pertinent  Health Maintenance Due  Topic Date Due  . INFLUENZA VACCINE  Completed  . DEXA SCAN  Completed  . PNA vac Low Risk Adult  Completed   Fall Risk  05/17/2018 05/10/2017 05/01/2017 07/17/2016 06/09/2015  Falls in the past year? No No No Yes No  Comment - - - tripped over the last stair in the house -  Number falls in past yr: - - - 1 -   Functional Status Survey:    Vitals:   09/02/19 1214  BP: 130/70  Pulse: 68  Resp: 18  Temp: (!) 96.9 F (36.1 C)  SpO2: 95%  Weight: 169 lb 12.8 oz (77 kg)  Height: 5\' 6"  (1.676 m)   Body mass index is 27.41 kg/m. Physical Exam  Constitutional: . Well-developed and well-nourished.  HENT:  Head: Normocephalic.  Mouth/Throat: Oropharynx is clear and moist.  Eyes: Pupils are equal, round, and reactive to light.  Neck: Neck supple.  Cardiovascular: Normal rate and normal heart sounds.  No murmur heard. Pulmonary/Chest: Effort normal and breath sounds normal. No respiratory distress. No wheezes. She has no rales.  Abdominal: Soft. Bowel sounds are normal. No distension. There is  no tenderness. There is no rebound.  Musculoskeletal: Mild edema with Chronic Venous changes Lymphadenopathy: none Neurological: Was able to get up from chair and walk with no walker but is slightly unsteady. No Deficits. Has aphasia Skin: Skin is warm and dry.  Psychiatric: Normal mood and affect. Behavior is normal. Thought content normal.    Labs reviewed: Recent Labs    11/21/18 0000 12/10/18 0000 03/25/19 0000 05/08/19 0000  NA 137 141 139 143  K 4.4 3.7 4.2 3.7  CL 102  --  102 105  CO2 22  --  28 31  BUN 27* 24* 16 16  CREATININE 0.8 0.6  0.7 0.7  CALCIUM 10.8  --  10.0 9.5   Recent Labs    11/21/18 0000 12/10/18 0000 03/25/19 0000 05/08/19 0000  AST 30 17 22 18   ALT 23 15 16 15   ALKPHOS 74 65 69 76  PROT 7.0  --  6.5 5.6*  ALBUMIN 4.6  --  3.9 3.7   Recent Labs    12/10/18 0000 03/25/19 0000 05/08/19 0000  WBC 7.8 10.9 6.8  HGB 13.7 15.4 13.6  HCT 41 46 41  PLT 220 287 235   Lab Results  Component Value Date   TSH 2.77 04/09/2018   Lab Results  Component Value Date   HGBA1C 5.3 04/26/2017   Lab Results  Component Value Date   CHOL 217 (A) 04/09/2018   HDL 51 04/09/2018   LDLCALC 147 04/09/2018   LDLDIRECT 173.0 08/07/2012   TRIG 86 04/09/2018   CHOLHDL 5 03/20/2016    Significant Diagnostic Results in last 30 days:  No results found.  Assessment/Plan  Essential hypertension, benign - Plan:  Controlled on Low dose of Tenromin  Chronic venous insufficiency - Plan:  Supportive care  Late onset Alzheimer's disease without behavioral disturbance  - Plan:  Patient has not tolerated Aricept and Namenda in the past Has severe aphasia Now she is in Memory Unit Will continue Supportive care Not on any Antipsychotics now Polymyalgia rheumatica - Plan:  Continue on Low Dose of Prednisone -DEXA scan in 1018 showed T score -1  Seems Comfortable depression with anxiety - Plan:  On Zoloft Has lost some weight  Will consider Remeron if  continues No GDR at this time 04/11/2018 Communication:  Total time spent in this patient care encounter was  25_  minutes; greater than 50% of the visit spent counseling staff, reviewing records , Labs and coordinating care for problems addressed at this encounter.  Labs/tests ordered:

## 2019-09-08 DIAGNOSIS — Z20828 Contact with and (suspected) exposure to other viral communicable diseases: Secondary | ICD-10-CM | POA: Diagnosis not present

## 2019-09-15 ENCOUNTER — Non-Acute Institutional Stay (SKILLED_NURSING_FACILITY): Payer: Medicare PPO | Admitting: Nurse Practitioner

## 2019-09-15 ENCOUNTER — Encounter: Payer: Self-pay | Admitting: Nurse Practitioner

## 2019-09-15 DIAGNOSIS — G301 Alzheimer's disease with late onset: Secondary | ICD-10-CM

## 2019-09-15 DIAGNOSIS — F0280 Dementia in other diseases classified elsewhere without behavioral disturbance: Secondary | ICD-10-CM | POA: Diagnosis not present

## 2019-09-15 DIAGNOSIS — F339 Major depressive disorder, recurrent, unspecified: Secondary | ICD-10-CM | POA: Diagnosis not present

## 2019-09-15 DIAGNOSIS — I10 Essential (primary) hypertension: Secondary | ICD-10-CM

## 2019-09-15 NOTE — Progress Notes (Signed)
Location:  [facility] Room Number: 110-A Place of Service:  SNF (31) Provider:  Venicia Vandall X, NP  Patient Care Team: Treyshaun Keatts X, NP as PCP - General (Internal Medicine) Hennie Duos, MD (Rheumatology) Clent Jacks, MD (Ophthalmology) Allyn Kenner, MD (Dermatology) Ardis Hughs, MD as Attending Physician (Urology) Ellen Goris X, NP as Nurse Practitioner (Internal Medicine)  Extended Emergency Contact Information Primary Emergency Contact: McClanahan,Susan Address: [place], [place] [address] Johnnette Litter of [place] Phone: [phone] Mobile Phone: [phone] Relation: Daughter Secondary Emergency Contact: Newman Nip States of [place] Phone: [phone] Relation: Daughter  Code Status:  DNR Goals of care: Advanced Directive information Advanced Directives 09/15/2019  Does Patient Have a Medical Advance Directive? Yes  Type of Advance Directive Out of facility DNR (pink MOST or yellow form)  Does patient want to make changes to medical advance directive? No - Patient declined  Copy of [place] in Chart? -  Would patient like information on creating a medical advance directive? -  Pre-existing out of facility DNR order (yellow form or pink MOST form) Yellow form placed in chart (order not valid for inpatient use)     Chief Complaint  Patient presents with  . Acute Visit    Patient is seen for acute mental status change.    HPI:  Pt is a 86 y.o. female seen today for an acute visit  for reported the patient's acute mental status change: 09/13/19 reported acute mental status change, agitated, confused than her baseline, swung/kicked staff when attempted to assist with ADL's, reduced oral intake. Her mood is managed on Sertraline 125mg  qd. HTN, blood pressure is controlled on Atenolol 25mg  qd.   Past Medical History:  Diagnosis Date  . Acute bronchitis 06/27/2015  .  Alzheimer's dementia ([place])   . Cataract   . Cellulitis of left leg   . Depression   . Eczema   . High cholesterol   . History of renal stone    excision  . Hypertension   . Mild cognitive impairment   . Osteoarthritis   . Polymyalgia rheumatica (HCC)    rx with prednisone  . Urinary incontinence   . Varicose veins   . Xerosis of skin    Past Surgical History:  Procedure Laterality Date  . cataract surgery    . EYE SURGERY    . JOINT REPLACEMENT Bilateral    Dr. Hector Shade  . kidney stone surgey    . lipoma removal      Allergies  Allergen Reactions  . Donepezil Other (See Comments)    BAD DREAMS  . Namenda [Memantine]   . Remeron [Mirtazapine] Other (See Comments)    Dizzy     Outpatient Encounter Medications as of 09/15/2019  Medication Sig  . acetaminophen (TYLENOL) 500 MG tablet Take 2 tablets (1,000 mg total) by mouth every 8 (eight) hours as needed.  Marland Kitchen atenolol (TENORMIN) 25 MG tablet Take 25 mg by mouth daily.  . calcium carbonate (OSCAL) 1500 (600 Ca) MG TABS tablet Take 600 mg of elemental calcium by mouth 2 (two) times daily with a meal.  . Carboxymethylcellulose Sodium (THERATEARS OP) Apply 1 drop to eye 4 (four) times daily. Both eyes  . cholecalciferol (VITAMIN D) 1000 units tablet Take 1,000 Units by mouth daily.  . Eyelid Cleansers (OCUSOFT BABY EYELID & EYELASH EX) Apply topically. 1 PAD BOTH  EYES; CLEAN THE LIDS 2 TIMES A DAY  . hydrocortisone 1 % lotion Apply 1 application topically 2 (two) times daily. Apply to skin around raised lesion left upper arm BID, cover with Telfa  . hydrocortisone cream 1 % Apply 1 application topically 2 (two) times daily as needed. Apply to the chest   . Omega-3 Fatty Acids (FISH OIL) 1200 MG CAPS Take 1 capsule by mouth daily.   . predniSONE (DELTASONE) 1 MG tablet Take 3 mg by mouth daily.   . sertraline (ZOLOFT) 100 MG tablet Take 100 mg by mouth daily. Take with 25 mg = 125 mg.  . sertraline (ZOLOFT) 25 MG tablet  Take 25 mg by mouth daily. 100 mg +25 mg= 125 mg  . therapeutic multivitamin-minerals (THERAGRAN-M) tablet Take 1 tablet by mouth daily.  Marland Kitchen UNABLE TO FIND Med Name: 120 SAL AC 30% in Petrolatum cream. Apply to right and left cheek dark spots at bedtime.   No facility-administered encounter medications on file as of 09/15/2019.   ROS was provided with assistance of staff.  Review of Systems  Constitutional: Negative for activity change, appetite change, chills, diaphoresis, fatigue and fever.  HENT: Positive for hearing loss. Negative for congestion and voice change.   Eyes: Negative for visual disturbance.  Respiratory: Negative for cough, shortness of breath and wheezing.   Cardiovascular: Positive for leg swelling. Negative for chest pain and palpitations.  Gastrointestinal: Negative for abdominal distention, abdominal pain, constipation, diarrhea, nausea and vomiting.  Genitourinary: Negative for difficulty urinating, dysuria and urgency.  Musculoskeletal: Positive for arthralgias, gait problem and myalgias.  Skin: Negative for color change and pallor.  Neurological: Negative for dizziness, speech difficulty, weakness and headaches.       Dementia  Psychiatric/Behavioral: Positive for agitation, behavioral problems and confusion. Negative for hallucinations and sleep disturbance. The patient is nervous/anxious.     Immunization History  Administered Date(s) Administered  . Influenza Whole 05/28/2012, 06/01/2018  . Influenza, High Dose Seasonal PF 06/08/2016  . Influenza-Unspecified 05/28/2014, 08/28/2014, 05/27/2015, 06/18/2017  . Pneumococcal Conjugate-13 01/21/2014  . Pneumococcal Polysaccharide-23 04/16/2015  . Td 09/29/2003  . Zoster 01/14/2011   Pertinent  Health Maintenance Due  Topic Date Due  . INFLUENZA VACCINE  Completed  . DEXA SCAN  Completed  . PNA vac Low Risk Adult  Completed   Fall Risk  05/17/2018 05/10/2017 05/01/2017 07/17/2016 06/09/2015  Falls in the past  year? No No No Yes No  Comment - - - tripped over the last stair in the house -  Number falls in past yr: - - - 1 -   Functional Status Survey:    Vitals:   09/15/19 1539  BP: (!) 155/74  Pulse: 67  Resp: 18  Temp: (!) 97.5 F (36.4 C)  TempSrc: Oral  SpO2: 92%  Weight: 169 lb 12.8 oz (77 kg)  Height: 5\' 6"  (1.676 m)   Body mass index is 27.41 kg/m. Physical Exam Vitals and nursing note reviewed.  Constitutional:      General: She is not in acute distress.    Appearance: Normal appearance. She is not ill-appearing, toxic-appearing or diaphoretic.  HENT:     Head: Normocephalic and atraumatic.     Nose: Nose normal.     Mouth/Throat:     Mouth: Mucous membranes are moist.  Eyes:     Extraocular Movements: Extraocular movements intact.     Conjunctiva/sclera: Conjunctivae normal.     Pupils: Pupils are equal, round, and reactive to light.  Cardiovascular:     Rate and Rhythm: Normal rate and regular rhythm.     Heart sounds: No murmur.  Pulmonary:     Breath sounds: No wheezing, rhonchi or rales.  Abdominal:     General: Bowel sounds are normal. There is no distension.     Palpations: Abdomen is soft.     Tenderness: There is no abdominal tenderness. There is no right CVA tenderness, left CVA tenderness, guarding or rebound.  Musculoskeletal:     Cervical back: Normal range of motion and neck supple.     Right lower leg: Edema present.     Left lower leg: Edema present.     Comments: Trace edema BLE  Skin:    General: Skin is warm and dry.  Neurological:     General: No focal deficit present.     Mental Status: She is alert. Mental status is at baseline.     Motor: No weakness.     Gait: Gait abnormal.     Comments: Oriented to self.   Psychiatric:     Comments: Pleasantly confused.      Labs reviewed: Recent Labs    11/21/18 0000 11/21/18 0000 12/10/18 0000 03/25/19 0000 05/08/19 0000  NA 137   < > 141 139 143  K 4.4   < > 3.7 4.2 3.7  CL 102  --    --  102 105  CO2 22  --   --  28 31  BUN 27*   < > 24* 16 16  CREATININE 0.8   < > 0.6 0.7 0.7  CALCIUM 10.8  --   --  10.0 9.5   < > = values in this interval not displayed.   Recent Labs    11/21/18 0000 11/21/18 0000 12/10/18 0000 03/25/19 0000 05/08/19 0000  AST 30   < > 17 22 18   ALT 23   < > 15 16 15   ALKPHOS 74   < > 65 69 76  PROT 7.0  --   --  6.5 5.6*  ALBUMIN 4.6  --   --  3.9 3.7   < > = values in this interval not displayed.   Recent Labs    12/10/18 0000 03/25/19 0000 05/08/19 0000  WBC 7.8 10.9 6.8  HGB 13.7 15.4 13.6  HCT 41 46 41  PLT 220 287 235   Lab Results  Component Value Date   TSH 2.77 04/09/2018   Lab Results  Component Value Date   HGBA1C 5.3 04/26/2017   Lab Results  Component Value Date   CHOL 217 (A) 04/09/2018   HDL 51 04/09/2018   LDLCALC 147 04/09/2018   LDLDIRECT 173.0 08/07/2012   TRIG 86 04/09/2018   CHOLHDL 5 03/20/2016    Significant Diagnostic Results in last 30 days:  No results found.  Assessment/Plan Late onset Alzheimer's disease without behavioral disturbance (HCC) 09/13/19 reported acute mental status change, agitated, confused than her baseline, swung/kicked staff when attempted to assist with ADL's, reduced oral intake.  09/15/19 the patient was in her usual state of health and mentation, pleasantly confused, follows simple directions. The regular nurse stated the patient likes to sleep in late in am, has some emotional outbursts when her morning sleep is interrupted.   Essential hypertension, benign Blood pressure is controlled, continue Atenolol.   Depression, recurrent (HCC) Her mood is stable, except when she cannot comprehend what's going on at the moment. Continue Sertraline      Family/ staff  Communication: plan of care reviewed with the patient and charge nurse.   Labs/tests ordered:  None  Time spend 25 minutes.

## 2019-09-16 ENCOUNTER — Encounter: Payer: Self-pay | Admitting: Nurse Practitioner

## 2019-09-16 NOTE — Assessment & Plan Note (Signed)
09/13/19 reported acute mental status change, agitated, confused than her baseline, swung/kicked staff when attempted to assist with ADL's, reduced oral intake.  09/15/19 the patient was in her usual state of health and mentation, pleasantly confused, follows simple directions. The regular nurse stated the patient likes to sleep in late in am, has some emotional outbursts when her morning sleep is interrupted.

## 2019-09-16 NOTE — Assessment & Plan Note (Signed)
Blood pressure is controlled, continue Atenolol.  °

## 2019-09-16 NOTE — Assessment & Plan Note (Signed)
Her mood is stable, except when she cannot comprehend what's going on at the moment. Continue Sertraline

## 2019-09-22 DIAGNOSIS — Z20828 Contact with and (suspected) exposure to other viral communicable diseases: Secondary | ICD-10-CM | POA: Diagnosis not present

## 2019-09-29 DIAGNOSIS — Z20828 Contact with and (suspected) exposure to other viral communicable diseases: Secondary | ICD-10-CM | POA: Diagnosis not present

## 2019-10-01 ENCOUNTER — Non-Acute Institutional Stay (SKILLED_NURSING_FACILITY): Payer: Medicare PPO | Admitting: Nurse Practitioner

## 2019-10-01 ENCOUNTER — Encounter: Payer: Self-pay | Admitting: Nurse Practitioner

## 2019-10-01 DIAGNOSIS — R000 Tachycardia, unspecified: Secondary | ICD-10-CM

## 2019-10-01 DIAGNOSIS — I10 Essential (primary) hypertension: Secondary | ICD-10-CM | POA: Diagnosis not present

## 2019-10-01 DIAGNOSIS — F0280 Dementia in other diseases classified elsewhere without behavioral disturbance: Secondary | ICD-10-CM | POA: Diagnosis not present

## 2019-10-01 DIAGNOSIS — M6281 Muscle weakness (generalized): Secondary | ICD-10-CM

## 2019-10-01 DIAGNOSIS — G301 Alzheimer's disease with late onset: Secondary | ICD-10-CM

## 2019-10-01 DIAGNOSIS — F339 Major depressive disorder, recurrent, unspecified: Secondary | ICD-10-CM

## 2019-10-01 DIAGNOSIS — M353 Polymyalgia rheumatica: Secondary | ICD-10-CM

## 2019-10-01 NOTE — Assessment & Plan Note (Signed)
Advanced stage, continue Memory care unit [facility] for safety, care assistance.

## 2019-10-01 NOTE — Assessment & Plan Note (Signed)
No focal weakness, pending CBC/diff, CMP/eGFR, observe.

## 2019-10-01 NOTE — Assessment & Plan Note (Signed)
10/01/19 the patient was in bed by noon upon my examination, opened eyes, stated I do not feel good, asked if she is in pain-answered sometimes but cannot specify the location/intensity/frequency.  HPI was provided with assistance of staff and the patient's daughter due to her dementia, HR 110s, she denied SOB, chest pain/pressure, or palpitation, Update CBC/diff, CMP/eGFR, VS q shift x72 hours. May consider EKG if tachycardia persisted. Schedule Tylenol 674m q8hr x 72hours. Will have prn Lorazepam 0.259mavailable to her per HPOA's request, HPOA is informed the patient's slurred speech and change mentation after Ativan use in the past.

## 2019-10-01 NOTE — Progress Notes (Signed)
Location:  [facility] Room Number: 110A Place of Service:  SNF (31) Provider:  Talaysia Pinheiro X Loreen Bankson, NP   Andjela Wickes X, NP  Patient Care Team: Svetlana Bagby X, NP as PCP - General (Internal Medicine) Hennie Duos, MD (Rheumatology) Clent Jacks, MD (Ophthalmology) Allyn Kenner, MD (Dermatology) Ardis Hughs, MD as Attending Physician (Urology) Sylena Lotter X, NP as Nurse Practitioner (Internal Medicine)  Extended Emergency Contact Information Primary Emergency Contact: McClanahan,Susan Address: [place], [place] [address] Johnnette Litter of [place] Phone: [phone] Mobile Phone: [phone] Relation: Daughter Secondary Emergency Contact: Newman Nip States of [place] Phone: [phone] Relation: Daughter  Code Status:  DNR Goals of care: Advanced Directive information Advanced Directives 10/01/2019  Does Patient Have a Medical Advance Directive? Yes  Type of Advance Directive Out of facility DNR (pink MOST or yellow form)  Does patient want to make changes to medical advance directive? No - Patient declined  Copy of [place] in Chart? -  Would patient like information on creating a medical advance directive? -  Pre-existing out of facility DNR order (yellow form or pink MOST form) Yellow form placed in chart (order not valid for inpatient use);Pink MOST form placed in chart (order not valid for inpatient use)     Chief Complaint  Patient presents with  . Acute Visit    Anxiety     HPI:  Pt is a 86 y.o. female seen today for an acute visit for 10/01/19 the patient was in bed by noon upon my examination, opened eyes, stated I do not feel good, asked if she is in pain-answered sometimes but cannot specify the location/intensity/frequency.  HPI was provided with assistance of staff and the patient's daughter due to her dementia, HR 110s, she denied SOB, chest pain/pressure, or  palpitation. Hx of dementia, resides in memory care [facility]. Her mood is managed on Sertraline 130m qd. Hx of PMR, on Prednisone 341mqd. HTN, blood pressure is controlled on Atenolol 2575md.     Past Medical History:  Diagnosis Date  . Acute bronchitis 06/27/2015  . [place] . Cataract   . Cellulitis of left leg   . Depression   . Eczema   . High cholesterol   . History of renal stone    excision  . Hypertension   . Mild cognitive impairment   . Osteoarthritis   . Polymyalgia rheumatica (HCC)    rx with prednisone  . Urinary incontinence   . Varicose veins   . Xerosis of skin    Past Surgical History:  Procedure Laterality Date  . cataract surgery    . EYE SURGERY    . JOINT REPLACEMENT Bilateral    Dr. FraHector Shade kidney stone surgey    . lipoma removal      Allergies  Allergen Reactions  . Donepezil Other (See Comments)    BAD DREAMS  . Namenda [Memantine]   . Remeron [Mirtazapine] Other (See Comments)    Dizzy     Outpatient Encounter Medications as of 10/01/2019  Medication Sig  . acetaminophen (TYLENOL) 500 MG tablet Take 2 tablets (1,000 mg total) by mouth every 8 (eight) hours as needed.  . aMarland Kitchenenolol (TENORMIN) 25 MG tablet Take 25 mg by mouth daily.  . calcium carbonate (OSCAL) 1500 (600 Ca) MG TABS tablet Take 600 mg of elemental calcium  by mouth 2 (two) times daily with a meal.  . Carboxymethylcellulose Sodium (THERATEARS OP) Apply 1 drop to eye 4 (four) times daily. Both eyes  . cholecalciferol (VITAMIN D) 1000 units tablet Take 1,000 Units by mouth daily.  . Eyelid Cleansers (OCUSOFT BABY EYELID & EYELASH EX) Apply topically. 1 PAD BOTH EYES; CLEAN THE LIDS 2 TIMES A DAY  . hydrocortisone 1 % lotion Apply 1 application topically 2 (two) times daily. Apply to skin around raised lesion left upper arm BID, cover with Telfa  . hydrocortisone cream 1 % Apply 1 application topically 2 (two) times daily as needed. Apply to the chest     . NON FORMULARY Moisturizing Cream to extremities with morning and evening care Twice A Day  . NON FORMULARY Diet Regular Special Instructions: Give prune juice daily with breakfast  . Omega-3 Fatty Acids (FISH OIL) 1200 MG CAPS Take 1 capsule by mouth daily.   . predniSONE (DELTASONE) 1 MG tablet Take 3 mg by mouth daily.   . sertraline (ZOLOFT) 100 MG tablet Take 100 mg by mouth daily. Take with 25 mg = 125 mg.  . sertraline (ZOLOFT) 25 MG tablet Take 25 mg by mouth daily. 100 mg +25 mg= 125 mg  . therapeutic multivitamin-minerals (THERAGRAN-M) tablet Take 1 tablet by mouth daily.  Marland Kitchen UNABLE TO FIND Med Name: 120 SAL AC 30% in Petrolatum cream. Apply to right and left cheek dark spots at bedtime.   No facility-administered encounter medications on file as of 10/01/2019.   ROS was provided with assistance of staff.  Review of Systems  Constitutional: Positive for activity change, appetite change and fatigue. Negative for chills, diaphoresis and fever.  HENT: Positive for hearing loss. Negative for congestion and voice change.   Eyes: Negative for visual disturbance.  Respiratory: Negative for cough, shortness of breath and wheezing.   Gastrointestinal: Negative for abdominal distention, constipation, diarrhea, nausea and vomiting.  Genitourinary: Negative for difficulty urinating, dysuria and urgency.  Musculoskeletal: Positive for arthralgias, gait problem and myalgias.  Skin: Negative for color change and pallor.  Neurological: Negative for dizziness, speech difficulty, weakness and headaches.       Dementia  Psychiatric/Behavioral: Positive for agitation, behavioral problems and confusion. Negative for hallucinations and sleep disturbance. The patient is nervous/anxious.     Immunization History  Administered Date(s) Administered  . Influenza Whole 05/28/2012, 06/01/2018  . Influenza, High Dose Seasonal PF 06/08/2016  . Influenza-Unspecified 05/28/2014, 08/28/2014, 05/27/2015,  06/18/2017  . Pneumococcal Conjugate-13 01/21/2014  . Pneumococcal Polysaccharide-23 04/16/2015  . Td 09/29/2003  . Zoster 01/14/2011   Pertinent  Health Maintenance Due  Topic Date Due  . INFLUENZA VACCINE  Completed  . DEXA SCAN  Completed  . PNA vac Low Risk Adult  Completed   Fall Risk  05/17/2018 05/10/2017 05/01/2017 07/17/2016 06/09/2015  Falls in the past year? No No No Yes No  Comment - - - tripped over the last stair in the house -  Number falls in past yr: - - - 1 -   Functional Status Survey:    Vitals:   10/01/19 1241  BP: 130/80  Pulse: 77  Resp: 20  Temp: 97.7 F (36.5 C)  TempSrc: Oral  SpO2: 96%  Weight: 169 lb 9.6 oz (76.9 kg)  Height: 5' 6"  (1.676 m)   Body mass index is 27.37 kg/m. Physical Exam Vitals and nursing note reviewed.  Constitutional:      General: She is not in acute distress.  Appearance: Normal appearance. She is ill-appearing. She is not toxic-appearing or diaphoretic.  HENT:     Head: Normocephalic and atraumatic.     Nose: Nose normal.     Mouth/Throat:     Mouth: Mucous membranes are dry.  Eyes:     Extraocular Movements: Extraocular movements intact.     Conjunctiva/sclera: Conjunctivae normal.     Pupils: Pupils are equal, round, and reactive to light.  Cardiovascular:     Rate and Rhythm: Regular rhythm. Tachycardia present.     Heart sounds: No murmur.  Pulmonary:     Breath sounds: No wheezing, rhonchi or rales.  Abdominal:     General: Bowel sounds are normal. There is no distension.     Palpations: Abdomen is soft.     Tenderness: There is no abdominal tenderness. There is no right CVA tenderness, left CVA tenderness, guarding or rebound.  Musculoskeletal:     Cervical back: Normal range of motion and neck supple.     Right lower leg: No edema.     Left lower leg: No edema.  Skin:    General: Skin is warm and dry.  Neurological:     General: No focal deficit present.     Mental Status: She is alert. Mental  status is at baseline.     Motor: No weakness.     Coordination: Coordination normal.     Gait: Gait abnormal.     Comments: Oriented to self only.   Psychiatric:     Comments: Confused, tired appearance.      Labs reviewed: Recent Labs    11/21/18 0000 11/21/18 0000 12/10/18 0000 03/25/19 0000 05/08/19 0000  NA 137   < > 141 139 143  K 4.4   < > 3.7 4.2 3.7  CL 102  --   --  102 105  CO2 22  --   --  28 31  BUN 27*   < > 24* 16 16  CREATININE 0.8   < > 0.6 0.7 0.7  CALCIUM 10.8  --   --  10.0 9.5   < > = values in this interval not displayed.   Recent Labs    11/21/18 0000 11/21/18 0000 12/10/18 0000 03/25/19 0000 05/08/19 0000  AST 30   < > 17 22 18   ALT 23   < > 15 16 15   ALKPHOS 74   < > 65 69 76  PROT 7.0  --   --  6.5 5.6*  ALBUMIN 4.6  --   --  3.9 3.7   < > = values in this interval not displayed.   Recent Labs    12/10/18 0000 03/25/19 0000 05/08/19 0000  WBC 7.8 10.9 6.8  HGB 13.7 15.4 13.6  HCT 41 46 41  PLT 220 287 235   Lab Results  Component Value Date   TSH 2.77 04/09/2018   Lab Results  Component Value Date   HGBA1C 5.3 04/26/2017   Lab Results  Component Value Date   CHOL 217 (A) 04/09/2018   HDL 51 04/09/2018   LDLCALC 147 04/09/2018   LDLDIRECT 173.0 08/07/2012   TRIG 86 04/09/2018   CHOLHDL 5 03/20/2016    Significant Diagnostic Results in last 30 days:    Assessment/Plan  Depression, recurrent ([place]) 10/01/19 the patient was in bed by noon upon my examination, opened eyes, stated I do not feel good, asked if she is in pain-answered sometimes but cannot specify the location/intensity/frequency.  HPI was  provided with assistance of staff and the patient's daughter due to her dementia, HR 110s, she denied SOB, chest pain/pressure, or palpitation, Update CBC/diff, CMP/eGFR, VS q shift x72 hours. May consider EKG if tachycardia persisted. Schedule Tylenol 645m q8hr x 72hours. Will have prn Lorazepam 0.252mavailable to her per  HPOA's request, HPOA is informed the patient's slurred speech and change mentation after Ativan use in the past.    Tachycardia Asymptomatic, 110bpm at rest, observe, may ?ED/ EKG if symptomatic  Essential hypertension, benign Blood pressure is controlled, continue Atenolol.   Polymyalgia rheumatica (HCC) Chronic, continue Prednisone. Schedule Tylenol x 72hours.   Late onset Alzheimer's disease without behavioral disturbance (HCStrawnAdvanced stage, [facility].   Generalized muscle weakness No focal weakness, pending CBC/diff, CMP/eGFR, observe.      Family/ staff Communication: plan of care reviewed with the patient, the patient's daughter, soEducation officer, museumand chCamera operator  Labs/tests ordered:  CBC/diff, CMP/eGFR  Time spend 25 minutes.

## 2019-10-01 NOTE — Assessment & Plan Note (Addendum)
Chronic, continue Prednisone. Schedule Tylenol x 72hours.

## 2019-10-01 NOTE — Assessment & Plan Note (Signed)
Asymptomatic, 110bpm at rest, observe, may ?ED/ EKG if symptomatic

## 2019-10-01 NOTE — Assessment & Plan Note (Signed)
Blood pressure is controlled, continue Atenolol.  °

## 2019-10-02 ENCOUNTER — Other Ambulatory Visit: Payer: Self-pay | Admitting: *Deleted

## 2019-10-02 DIAGNOSIS — I10 Essential (primary) hypertension: Secondary | ICD-10-CM | POA: Diagnosis not present

## 2019-10-02 LAB — CBC AND DIFFERENTIAL
HCT: 42 (ref 36–46)
Hemoglobin: 14.1 (ref 12.0–16.0)
Neutrophils Absolute: 3586
WBC: 7.2

## 2019-10-02 LAB — HEPATIC FUNCTION PANEL
ALT: 16 (ref 7–35)
AST: 20 (ref 13–35)
Alkaline Phosphatase: 68 (ref 25–125)
Bilirubin, Total: 0.5

## 2019-10-02 LAB — COMPREHENSIVE METABOLIC PANEL
Albumin: 3.6 (ref 3.5–5.0)
Calcium: 8.9 (ref 8.7–10.7)
GFR calc Af Amer: 92
GFR calc non Af Amer: 79
Globulin: 2.1

## 2019-10-02 LAB — BASIC METABOLIC PANEL
BUN: 21 (ref 4–21)
CO2: 25 — AB (ref 13–22)
Chloride: 107 (ref 99–108)
Creatinine: 0.6 (ref 0.5–1.1)
Glucose: 93
Potassium: 4.1 (ref 3.4–5.3)
Sodium: 141 (ref 137–147)

## 2019-10-02 LAB — CBC: RBC: 4.91 (ref 3.87–5.11)

## 2019-10-02 MED ORDER — LORAZEPAM 0.5 MG PO TABS: 0.2500 mg | ORAL_TABLET | Freq: Three times a day (TID) | ORAL | 0 refills | Status: DC | PRN

## 2019-10-02 NOTE — Telephone Encounter (Signed)
Received fax refill request from [facility] °Pended Rx and sent to [employer] for approval.  °

## 2019-10-06 DIAGNOSIS — Z20828 Contact with and (suspected) exposure to other viral communicable diseases: Secondary | ICD-10-CM | POA: Diagnosis not present

## 2019-10-10 ENCOUNTER — Non-Acute Institutional Stay (SKILLED_NURSING_FACILITY): Payer: Medicare PPO | Admitting: Nurse Practitioner

## 2019-10-10 ENCOUNTER — Encounter: Payer: Self-pay | Admitting: Nurse Practitioner

## 2019-10-10 DIAGNOSIS — G301 Alzheimer's disease with late onset: Secondary | ICD-10-CM

## 2019-10-10 DIAGNOSIS — M353 Polymyalgia rheumatica: Secondary | ICD-10-CM | POA: Diagnosis not present

## 2019-10-10 DIAGNOSIS — F0280 Dementia in other diseases classified elsewhere without behavioral disturbance: Secondary | ICD-10-CM

## 2019-10-10 DIAGNOSIS — F339 Major depressive disorder, recurrent, unspecified: Secondary | ICD-10-CM

## 2019-10-10 NOTE — Assessment & Plan Note (Addendum)
Continue Memory care unit [facility] for safety, care assistance, behaviors and emotional outburst occasionally, prn Lorazepam available for a short term-not used so far.  Observe.

## 2019-10-10 NOTE — Progress Notes (Signed)
Location:   [facility] Room Number: 110 Place of Service:  SNF (31) Provider: Marlana Latus NP  Marquisha Nikolov X, NP  Patient Care Team: Requan Hardge X, NP as PCP - General (Internal Medicine) Hennie Duos, MD (Rheumatology) Clent Jacks, MD (Ophthalmology) Allyn Kenner, MD (Dermatology) Ardis Hughs, MD as Attending Physician (Urology) Yehia Mcbain X, NP as Nurse Practitioner (Internal Medicine)  Extended Emergency Contact Information Primary Emergency Contact: McClanahan,Susan Address: [place], [place] [address] Johnnette Litter of [place] Phone: [phone] Mobile Phone: [phone] Relation: Daughter Secondary Emergency Contact: Newman Nip [place] of [place] Phone: [phone] Relation: Daughter  Code Status:  DNR Goals of care: Advanced Directive information Advanced Directives 10/10/2019  Does Patient Have a Medical Advance Directive? Yes  Type of Advance Directive Living will;Out of facility DNR (pink MOST or yellow form)  Does patient want to make changes to medical advance directive? No - Patient declined  Copy of [place] in Chart? -  Would patient like information on creating a medical advance directive? -  Pre-existing out of facility DNR order (yellow form or pink MOST form) Pink MOST/Yellow Form most recent copy in chart - Physician notified to receive inpatient order     Chief Complaint  Patient presents with  . Medical Management of Chronic Issues    Routine Visit     HPI:  Pt is a 86 y.o. female seen today for medical management of chronic diseases.    The patient resides in Memory care unit [facility] for safety, care assistance, ambulates with walker, prn Lorazepam 0.25mg  q8hr x 2weeks available since 10/01/19, on Sertraline 125mg  qd, refused labs. PMR, stable, on Prednisone 3mg  qd.    Past Medical History:  Diagnosis Date  . Acute bronchitis 06/27/2015  .  Alzheimer's dementia ([place])   . Cataract   . Cellulitis of left leg   . Depression   . Eczema   . High cholesterol   . History of renal stone    excision  . Hypertension   . Mild cognitive impairment   . Osteoarthritis   . Polymyalgia rheumatica (HCC)    rx with prednisone  . Urinary incontinence   . Varicose veins   . Xerosis of skin    Past Surgical History:  Procedure Laterality Date  . cataract surgery    . EYE SURGERY    . JOINT REPLACEMENT Bilateral    Dr. Hector Shade  . kidney stone surgey    . lipoma removal      Allergies  Allergen Reactions  . Donepezil Other (See Comments)    BAD DREAMS  . Namenda [Memantine]   . Remeron [Mirtazapine] Other (See Comments)    Dizzy     Allergies as of 10/10/2019      Reactions   Donepezil Other (See Comments)   BAD DREAMS   Namenda [memantine]    Remeron [mirtazapine] Other (See Comments)   Dizzy       Medication List       Accurate as of October 10, 2019  4:32 PM. If you have any questions, ask your nurse or doctor.        STOP taking these medications   NON FORMULARY Stopped by: Jahnai Slingerland X Billijo Dilling, NP     TAKE these medications   acetaminophen 325 MG tablet Commonly known as: TYLENOL Take 650 mg by mouth every 8 (  eight) hours as needed.   acetaminophen 500 MG tablet Commonly known as: TYLENOL Take 2 tablets (1,000 mg total) by mouth every 8 (eight) hours as needed.   atenolol 25 MG tablet Commonly known as: TENORMIN Take 25 mg by mouth daily.   calcium carbonate 1500 (600 Ca) MG Tabs tablet Commonly known as: OSCAL Take 600 mg of elemental calcium by mouth 2 (two) times daily with a meal.   cholecalciferol 25 MCG (1000 UNIT) tablet Commonly known as: VITAMIN D Take 1,000 Units by mouth daily.   Fish Oil 1200 MG Caps Take 1 capsule by mouth daily.   hydrocortisone cream 1 % Apply 1 application topically 2 (two) times daily as needed. Apply to the chest   hydrocortisone 1 % lotion Apply 1  application topically 2 (two) times daily. Apply to skin around raised lesion left upper arm BID, cover with Telfa   LORazepam 0.5 MG tablet Commonly known as: ATIVAN Take 0.5 tablets (0.25 mg total) by mouth every 8 (eight) hours as needed for anxiety. For 14 days   NON FORMULARY Diet Regular Special Instructions: Give prune juice daily with breakfast   OCUSOFT BABY EYELID & EYELASH EX Apply topically. 1 PAD BOTH EYES; CLEAN THE LIDS 2 TIMES A DAY   predniSONE 1 MG tablet Commonly known as: DELTASONE Take 3 mg by mouth daily.   sertraline 100 MG tablet Commonly known as: ZOLOFT Take 100 mg by mouth daily. Take with 25 mg = 125 mg.   sertraline 25 MG tablet Commonly known as: ZOLOFT Take 25 mg by mouth daily. 100 mg +25 mg= 125 mg   therapeutic multivitamin-minerals tablet Take 1 tablet by mouth daily.   THERATEARS OP Apply 1 drop to eye 4 (four) times daily. Both eyes   UNABLE TO FIND Med Name: 120 SAL AC 30% in Petrolatum cream. Apply to right and left cheek dark spots at bedtime.      ROS was provided with assistance of staff.  Review of Systems  Constitutional: Negative for activity change, appetite change, chills, diaphoresis, fatigue, fever and unexpected weight change.  HENT: Positive for hearing loss. Negative for congestion and voice change.   Eyes: Negative for visual disturbance.  Respiratory: Negative for cough, shortness of breath and wheezing.   Cardiovascular: Negative for chest pain, palpitations and leg swelling.  Gastrointestinal: Negative for abdominal distention, abdominal pain, constipation, diarrhea, nausea and vomiting.  Genitourinary: Negative for difficulty urinating, dysuria, frequency and urgency.  Musculoskeletal: Positive for arthralgias, gait problem and myalgias.  Skin: Negative for color change and pallor.  Neurological: Negative for dizziness, speech difficulty, weakness and headaches.       Dementia  Psychiatric/Behavioral: Positive  for agitation and behavioral problems. Negative for hallucinations and sleep disturbance. The patient is not nervous/anxious.        Occasional.     Immunization History  Administered Date(s) Administered  . Influenza Whole 05/28/2012, 06/01/2018  . Influenza, High Dose Seasonal PF 06/08/2016  . Influenza-Unspecified 05/28/2014, 08/28/2014, 05/27/2015, 06/18/2017  . Moderna SARS-COVID-2 Vaccination 08/30/2019, 09/27/2019  . Pneumococcal Conjugate-13 01/21/2014  . Pneumococcal Polysaccharide-23 04/16/2015  . Td 09/29/2003  . Zoster 01/14/2011   Pertinent  Health Maintenance Due  Topic Date Due  . INFLUENZA VACCINE  Completed  . DEXA SCAN  Completed  . PNA vac Low Risk Adult  Completed   Fall Risk  05/17/2018 05/10/2017 05/01/2017 07/17/2016 06/09/2015  Falls in the past year? No No No Yes No  Comment - - - tripped  over the last stair in the house -  Number falls in past yr: - - - 1 -   Functional Status Survey:    Vitals:   10/10/19 1458  BP: 132/62  Pulse: 64  Resp: 18  Temp: (!) 97.3 F (36.3 C)  SpO2: 94%  Weight: 169 lb 9.6 oz (76.9 kg)  Height: 5\' 6"  (1.676 m)   Body mass index is 27.37 kg/m. Physical Exam Vitals and nursing note reviewed.  Constitutional:      General: She is not in acute distress.    Appearance: Normal appearance. She is not ill-appearing, toxic-appearing or diaphoretic.  HENT:     Head: Normocephalic and atraumatic.     Nose: Nose normal.     Mouth/Throat:     Mouth: Mucous membranes are moist.  Eyes:     Extraocular Movements: Extraocular movements intact.     Conjunctiva/sclera: Conjunctivae normal.     Pupils: Pupils are equal, round, and reactive to light.  Cardiovascular:     Rate and Rhythm: Normal rate and regular rhythm.     Heart sounds: No murmur.  Pulmonary:     Breath sounds: No wheezing, rhonchi or rales.  Abdominal:     General: Bowel sounds are normal. There is no distension.     Palpations: Abdomen is soft.      Tenderness: There is no abdominal tenderness. There is no left CVA tenderness, guarding or rebound.  Musculoskeletal:     Cervical back: Normal range of motion and neck supple.     Right lower leg: No edema.     Left lower leg: No edema.  Skin:    General: Skin is warm and dry.  Neurological:     General: No focal deficit present.     Mental Status: She is alert. Mental status is at baseline.     Motor: No weakness.     Coordination: Coordination normal.     Gait: Gait abnormal.     Comments: Oriented to self.   Psychiatric:     Comments: Confused.      Labs reviewed: Recent Labs    03/25/19 0000 05/08/19 0000 10/02/19 0000  NA 139 143 141  K 4.2 3.7 4.1  CL 102 105 107  CO2 28 31 25*  BUN 16 16 21   CREATININE 0.7 0.7 0.6  CALCIUM 10.0 9.5 8.9   Recent Labs    11/21/18 0000 12/10/18 0000 03/25/19 0000 05/08/19 0000 10/02/19 0000  AST 30   < > 22 18 20   ALT 23   < > 16 15 16   ALKPHOS 74   < > 69 76 68  PROT 7.0  --  6.5 5.6*  --   ALBUMIN 4.6  --  3.9 3.7 3.6   < > = values in this interval not displayed.   Recent Labs    12/10/18 0000 12/10/18 0000 03/25/19 0000 05/08/19 0000 10/02/19 0000  WBC 7.8   < > 10.9 6.8 7.2  NEUTROABS  --   --   --   --  3,586  HGB 13.7   < > 15.4 13.6 14.1  HCT 41   < > 46 41 42  PLT 220  --  287 235  --    < > = values in this interval not displayed.   Lab Results  Component Value Date   TSH 2.77 04/09/2018   Lab Results  Component Value Date   HGBA1C 5.3 04/26/2017   Lab Results  Component Value Date   CHOL 217 (A) 04/09/2018   HDL 51 04/09/2018   LDLCALC 147 04/09/2018   LDLDIRECT 173.0 08/07/2012   TRIG 86 04/09/2018   CHOLHDL 5 03/20/2016    Significant Diagnostic Results in last 30 days:  No results found.  Assessment/Plan  Late onset Alzheimer's disease without behavioral disturbance (HCC) Continue Memory care unit [facility] for safety, care assistance, behaviors and emotional outburst occasionally, prn  Lorazepam available for a short term-not used so far.  Observe.   Depression, recurrent (HCC) Occasional behaviors and emotional outburst are related to her dementia, otherwise her mood is reasonably stable, continue Sertraline. Prn Lorazepam-not used  Polymyalgia rheumatica (HCC) Stable, continue Prednisone 3mg  qd.    Family/ staff Communication: plan of care reviewed with the patient and charge nurse.   Labs/tests ordered:  none  Time spend 25 minutes.

## 2019-10-10 NOTE — Assessment & Plan Note (Signed)
Stable, continue Prednisone 3mg  qd.

## 2019-10-10 NOTE — Assessment & Plan Note (Addendum)
Occasional behaviors and emotional outburst are related to her dementia, otherwise her mood is reasonably stable, continue Sertraline. Prn Lorazepam-not used

## 2019-10-13 DIAGNOSIS — Z20828 Contact with and (suspected) exposure to other viral communicable diseases: Secondary | ICD-10-CM | POA: Diagnosis not present

## 2019-10-29 ENCOUNTER — Other Ambulatory Visit: Payer: Self-pay | Admitting: *Deleted

## 2019-10-29 NOTE — Telephone Encounter (Signed)
Received fax from [facility] for refill request Pended Rx and sent to [facility] for approval.

## 2019-11-03 MED ORDER — LORAZEPAM 0.5 MG PO TABS: 0.2500 mg | ORAL_TABLET | Freq: Three times a day (TID) | ORAL | 1 refills | Status: DC | PRN

## 2019-11-05 ENCOUNTER — Encounter: Payer: Self-pay | Admitting: Nurse Practitioner

## 2019-11-05 ENCOUNTER — Non-Acute Institutional Stay (SKILLED_NURSING_FACILITY): Payer: Medicare PPO | Admitting: Nurse Practitioner

## 2019-11-05 DIAGNOSIS — G301 Alzheimer's disease with late onset: Secondary | ICD-10-CM | POA: Diagnosis not present

## 2019-11-05 DIAGNOSIS — F0280 Dementia in other diseases classified elsewhere without behavioral disturbance: Secondary | ICD-10-CM | POA: Diagnosis not present

## 2019-11-05 DIAGNOSIS — F339 Major depressive disorder, recurrent, unspecified: Secondary | ICD-10-CM

## 2019-11-05 DIAGNOSIS — M353 Polymyalgia rheumatica: Secondary | ICD-10-CM | POA: Diagnosis not present

## 2019-11-05 DIAGNOSIS — Z20828 Contact with and (suspected) exposure to other viral communicable diseases: Secondary | ICD-10-CM | POA: Diagnosis not present

## 2019-11-05 DIAGNOSIS — I10 Essential (primary) hypertension: Secondary | ICD-10-CM

## 2019-11-05 NOTE — Assessment & Plan Note (Signed)
Her mood is stable, continue Sertraline.  

## 2019-11-05 NOTE — Assessment & Plan Note (Signed)
Stable, continue Prednisone 3mg  qd, prn Tylenol.

## 2019-11-05 NOTE — Assessment & Plan Note (Signed)
Blood pressure is controlled, continue Atenolol.  °

## 2019-11-05 NOTE — Assessment & Plan Note (Signed)
The patient is functioning well in memory care unit, continue supportive care.

## 2019-11-05 NOTE — Progress Notes (Signed)
Location:   [place] Room Number: 110 Place of Service:  SNF (31) Provider:  Toluwanimi Radebaugh, NP  [place] Bryngelson X, NP  Patient Care Team: Raysean Graumann X, NP as PCP - General (Internal Medicine) Hennie Duos, MD (Rheumatology) Clent Jacks, MD (Ophthalmology) Allyn Kenner, MD (Dermatology) Ardis Hughs, MD as Attending Physician (Urology) Nickalos Petersen X, NP as Nurse Practitioner (Internal Medicine)  Extended Emergency Contact Information Primary Emergency Contact: McClanahan,Susan Address: [place], [place] [address] [address]nette Litter of [place] Phone: [phone] Mobile Phone: [phone] Relation: Daughter Secondary Emergency Contact: Newman Nip [place] of [place] Phone: [phone] Relation: Daughter  Code Status:  DNR Goals of care: Advanced Directive information Advanced Directives 11/05/2019  Does Patient Have a Medical Advance Directive? Yes  Type of Advance Directive Out of facility DNR (pink MOST or yellow form)  Does patient want to make changes to medical advance directive? No - Patient declined  Copy of [place] in Chart? -  Would patient like information on creating a medical advance directive? -  Pre-existing out of facility DNR order (yellow form or pink MOST form) Pink MOST form placed in chart (order not valid for inpatient use);Yellow form placed in chart (order not valid for inpatient use)     Chief Complaint  Patient presents with  . Medical Management of Chronic Issues    Routine Visit    HPI:  Pt is a 86 y.o. female seen today for medical management of chronic diseases.    The patient resides in memory care unit [facility] for safety, care assistance, ambulates with walker, her mood is stable, on Sertraline 125mg  qd, prn Lorazepam. PMR, stable, on Prednisone 3mg  qd, prn Tylenol. HTN, blood pressure is controlled, on Atenolol 25mg  qd.    Past Medical History:    Diagnosis Date  . Acute bronchitis 06/27/2015  . Alzheimer's dementia ([place])   . Cataract   . Cellulitis of left leg   . Depression   . Eczema   . High cholesterol   . History of renal stone    excision  . Hypertension   . Mild cognitive impairment   . Osteoarthritis   . Polymyalgia rheumatica (HCC)    rx with prednisone  . Urinary incontinence   . Varicose veins   . Xerosis of skin    Past Surgical History:  Procedure Laterality Date  . cataract surgery    . EYE SURGERY    . JOINT REPLACEMENT Bilateral    Dr. Hector Shade  . kidney stone surgey    . lipoma removal      Allergies  Allergen Reactions  . Donepezil Other (See Comments)    BAD DREAMS  . Namenda [Memantine]   . Remeron [Mirtazapine] Other (See Comments)    Dizzy     Allergies as of 11/05/2019      Reactions   Donepezil Other (See Comments)   BAD DREAMS   Namenda [memantine]    Remeron [mirtazapine] Other (See Comments)   Dizzy       Medication List       Accurate as of November 05, 2019 12:57 PM. If you have any questions, ask your nurse or doctor.        acetaminophen 500 MG tablet Commonly known as: TYLENOL Take 2 tablets (1,000 mg total) by mouth every 8 (eight) hours as needed. What changed: Another medication  with the same name was removed. Continue taking this medication, and follow the directions you see here. Changed by: Jahnavi Muratore X Elsbeth Yearick, NP   atenolol 25 MG tablet Commonly known as: TENORMIN Take 25 mg by mouth daily.   calcium carbonate 1500 (600 Ca) MG Tabs tablet Commonly known as: OSCAL Take 600 mg of elemental calcium by mouth 2 (two) times daily with a meal.   cholecalciferol 25 MCG (1000 UNIT) tablet Commonly known as: VITAMIN D Take 1,000 Units by mouth daily.   Fish Oil 1200 MG Caps Take 1 capsule by mouth daily.   hydrocortisone cream 1 % Apply 1 application topically 2 (two) times daily as needed. Apply to the chest What changed: Another medication with the same name  was removed. Continue taking this medication, and follow the directions you see here. Changed by: Jareli Highland X Timi Reeser, NP   LORazepam 0.5 MG tablet Commonly known as: ATIVAN Take 0.5 tablets (0.25 mg total) by mouth every 8 (eight) hours as needed for anxiety.   NON FORMULARY Diet Regular Special Instructions: Give prune juice daily with breakfast   OCUSOFT BABY EYELID & EYELASH EX Apply topically. 1 PAD BOTH EYES; CLEAN THE LIDS 2 TIMES A DAY   predniSONE 1 MG tablet Commonly known as: DELTASONE Take 3 mg by mouth daily.   sertraline 100 MG tablet Commonly known as: ZOLOFT Take 100 mg by mouth daily. Take with 25 mg = 125 mg.   sertraline 25 MG tablet Commonly known as: ZOLOFT Take 25 mg by mouth daily. 100 mg +25 mg= 125 mg   therapeutic multivitamin-minerals tablet Take 1 tablet by mouth daily.   THERATEARS OP Apply 1 drop to eye 4 (four) times daily. Both eyes   UNABLE TO FIND Med Name: 120 SAL AC 30% in Petrolatum cream. Apply to right and left cheek dark spots at bedtime.      ROS was provided with assistance of staff.  Review of Systems  Constitutional: Negative for activity change, appetite change, fatigue, fever and unexpected weight change.  HENT: Positive for hearing loss. Negative for congestion and voice change.   Eyes: Negative for visual disturbance.  Respiratory: Negative for cough and shortness of breath.   Cardiovascular: Negative for chest pain and leg swelling.  Gastrointestinal: Negative for abdominal distention, abdominal pain, constipation, nausea and vomiting.  Genitourinary: Negative for difficulty urinating, dysuria, frequency and urgency.  Musculoskeletal: Positive for arthralgias, gait problem and myalgias.  Skin: Negative for color change.  Neurological: Negative for dizziness, speech difficulty, weakness and headaches.       Dementia  Psychiatric/Behavioral: Positive for agitation and behavioral problems. Negative for hallucinations and sleep  disturbance. The patient is not nervous/anxious.        Occasional.     Immunization History  Administered Date(s) Administered  . Influenza Whole 05/28/2012, 06/01/2018  . Influenza, High Dose Seasonal PF 06/08/2016  . Influenza-Unspecified 05/28/2014, 08/28/2014, 05/27/2015, 06/18/2017  . Moderna SARS-COVID-2 Vaccination 08/30/2019, 09/27/2019  . Pneumococcal Conjugate-13 01/21/2014  . Pneumococcal Polysaccharide-23 04/16/2015  . Td 09/29/2003  . Zoster 01/14/2011   Pertinent  Health Maintenance Due  Topic Date Due  . INFLUENZA VACCINE  Completed  . DEXA SCAN  Completed  . PNA vac Low Risk Adult  Completed   Fall Risk  05/17/2018 05/10/2017 05/01/2017 07/17/2016 06/09/2015  Falls in the past year? No No No Yes No  Comment - - - tripped over the last stair in the house -  Number falls in past yr: - - -  1 -   Functional Status Survey:    Vitals:   11/05/19 1050  BP: 138/82  Pulse: 88  Resp: 20  Temp: 98 F (36.7 C)  SpO2: 97%  Weight: 171 lb 3.2 oz (77.7 kg)  Height: 5\' 6"  (1.676 m)   Body mass index is 27.63 kg/m. Physical Exam Vitals and nursing note reviewed.  Constitutional:      General: She is not in acute distress.    Appearance: Normal appearance. She is not ill-appearing.  HENT:     Head: Normocephalic and atraumatic.     Mouth/Throat:     Mouth: Mucous membranes are moist.  Eyes:     Extraocular Movements: Extraocular movements intact.     Conjunctiva/sclera: Conjunctivae normal.     Pupils: Pupils are equal, round, and reactive to light.  Cardiovascular:     Rate and Rhythm: Normal rate and regular rhythm.     Heart sounds: No murmur.  Pulmonary:     Breath sounds: No wheezing or rales.  Abdominal:     General: Bowel sounds are normal. There is no distension.     Palpations: Abdomen is soft.     Tenderness: There is no abdominal tenderness. There is no guarding.  Musculoskeletal:     Cervical back: Normal range of motion and neck supple.      Right lower leg: No edema.     Left lower leg: No edema.  Skin:    General: Skin is warm and dry.  Neurological:     General: No focal deficit present.     Mental Status: She is alert. Mental status is at baseline.     Motor: No weakness.     Coordination: Coordination normal.     Gait: Gait abnormal.     Comments: Oriented to self.   Psychiatric:     Comments: Confused.      Labs reviewed: Recent Labs    03/25/19 0000 05/08/19 0000 10/02/19 0000  NA 139 143 141  K 4.2 3.7 4.1  CL 102 105 107  CO2 28 31 25*  BUN 16 16 21   CREATININE 0.7 0.7 0.6  CALCIUM 10.0 9.5 8.9   Recent Labs    11/21/18 0000 12/10/18 0000 03/25/19 0000 05/08/19 0000 10/02/19 0000  AST 30   < > 22 18 20   ALT 23   < > 16 15 16   ALKPHOS 74   < > 69 76 68  PROT 7.0  --  6.5 5.6*  --   ALBUMIN 4.6  --  3.9 3.7 3.6   < > = values in this interval not displayed.   Recent Labs    12/10/18 0000 12/10/18 0000 03/25/19 0000 05/08/19 0000 10/02/19 0000  WBC 7.8   < > 10.9 6.8 7.2  NEUTROABS  --   --   --   --  3,586  HGB 13.7   < > 15.4 13.6 14.1  HCT 41   < > 46 41 42  PLT 220  --  287 235  --    < > = values in this interval not displayed.   Lab Results  Component Value Date   TSH 2.77 04/09/2018   Lab Results  Component Value Date   HGBA1C 5.3 04/26/2017   Lab Results  Component Value Date   CHOL 217 (A) 04/09/2018   HDL 51 04/09/2018   LDLCALC 147 04/09/2018   LDLDIRECT 173.0 08/07/2012   TRIG 86 04/09/2018   CHOLHDL 5 03/20/2016  Significant Diagnostic Results in last 30 days:  No results found.  Assessment/Plan Essential hypertension, benign Blood pressure is controlled, continue Atenolol.   Late onset Alzheimer's disease without behavioral disturbance [facility]) The patient is functioning well in memory care unit, continue supportive care.   Depression, recurrent (HCC) Her mood is stable, continue Sertraline.   Polymyalgia rheumatica (HCC) Stable, continue  Prednisone 3mg  qd, prn Tylenol.      Family/ staff Communication: plan of care reviewed with the patient and charge nurse.   Labs/tests ordered:  none  Time spend 25 minutes.

## 2019-11-12 DIAGNOSIS — Z20828 Contact with and (suspected) exposure to other viral communicable diseases: Secondary | ICD-10-CM | POA: Diagnosis not present

## 2019-11-24 ENCOUNTER — Encounter: Payer: Self-pay | Admitting: Nurse Practitioner

## 2019-11-24 ENCOUNTER — Non-Acute Institutional Stay (SKILLED_NURSING_FACILITY): Payer: Medicare PPO | Admitting: Nurse Practitioner

## 2019-11-24 DIAGNOSIS — R410 Disorientation, unspecified: Secondary | ICD-10-CM

## 2019-11-24 DIAGNOSIS — R443 Hallucinations, unspecified: Secondary | ICD-10-CM | POA: Insufficient documentation

## 2019-11-24 DIAGNOSIS — I10 Essential (primary) hypertension: Secondary | ICD-10-CM | POA: Diagnosis not present

## 2019-11-24 DIAGNOSIS — F339 Major depressive disorder, recurrent, unspecified: Secondary | ICD-10-CM | POA: Diagnosis not present

## 2019-11-24 NOTE — Progress Notes (Signed)
Location:   [facility] Room Number: 110 Place of Service:  SNF (31) Provider: Marlana Latus NP  Lamiracle Chaidez X, NP  Patient Care Team: Zavier Canela X, NP as PCP - General (Internal Medicine) Hennie Duos, MD (Rheumatology) Clent Jacks, MD (Ophthalmology) Allyn Kenner, MD (Dermatology) Ardis Hughs, MD as Attending Physician (Urology) Liboria Putnam X, NP as Nurse Practitioner (Internal Medicine)  Extended Emergency Contact Information Primary Emergency Contact: McClanahan,Susan Address: [place], [place] [address] Johnnette Litter of [place] Phone: [phone] Mobile Phone: [phone] Relation: Daughter Secondary Emergency Contact: Newman Nip States of [place] Phone: [phone] Relation: Daughter  Code Status: DNR Goals of care: Advanced Directive information Advanced Directives 11/24/2019  Does Patient Have a Medical Advance Directive? Yes  Type of Advance Directive Out of facility DNR (pink MOST or yellow form)  Does patient want to make changes to medical advance directive? No - Patient declined  Copy of [place] in Chart? -  Would patient like information on creating a medical advance directive? -  Pre-existing out of facility DNR order (yellow form or pink MOST form) Yellow form placed in chart (order not valid for inpatient use);Pink MOST form placed in chart (order not valid for inpatient use)     Chief Complaint  Patient presents with  . Acute Visit    Confusion    HPI:  Pt is a 86 y.o. female seen today for an acute visit for reported 11/22/19 the patient increased confusion, unable to understand what was being said to her, word salad, hallucinations, was having "phone" conversation with her daughter during lunch with no phone. Hx of dementia, resides in memory care unit, [facility], ambulates with walker, her mood is managed on Sertraline, prn Lorazepam. HTN, blood  pressure is controlled on Atenolol.    Past Medical History:  Diagnosis Date  . Acute bronchitis 06/27/2015  . Alzheimer's dementia ([facility])   . Cataract   . Cellulitis of left leg   . Depression   . Eczema   . High cholesterol   . History of renal stone    excision  . Hypertension   . Mild cognitive impairment   . Osteoarthritis   . Polymyalgia rheumatica ([facility])    rx with prednisone  . Urinary incontinence   . Varicose veins   . Xerosis of skin    Past Surgical History:  Procedure Laterality Date  . cataract surgery    . EYE SURGERY    . JOINT REPLACEMENT Bilateral    Dr. Hector Shade  . kidney stone surgey    . lipoma removal      Allergies  Allergen Reactions  . Donepezil Other (See Comments)    BAD DREAMS  . Namenda [Memantine]   . Remeron [Mirtazapine] Other (See Comments)    Dizzy     Allergies as of 11/24/2019      Reactions   Donepezil Other (See Comments)   BAD DREAMS   Namenda [memantine]    Remeron [mirtazapine] Other (See Comments)   Dizzy       Medication List       Accurate as of November 24, 2019  2:39 PM. If you have any questions, ask your nurse or doctor.        acetaminophen 500 MG tablet Commonly known as: TYLENOL Take 2 tablets (1,000 mg total) by mouth every 8 (eight) hours as needed.  atenolol 25 MG tablet Commonly known as: TENORMIN Take 25 mg by mouth daily.   calcium carbonate 1500 (600 Ca) MG Tabs tablet Commonly known as: OSCAL Take 600 mg of elemental calcium by mouth 2 (two) times daily with a meal.   cholecalciferol 25 MCG (1000 UNIT) tablet Commonly known as: VITAMIN D Take 1,000 Units by mouth daily.   Fish Oil 1200 MG Caps Take 1 capsule by mouth daily.   hydrocortisone cream 1 % Apply 1 application topically 2 (two) times daily as needed. Apply to the chest   LORazepam 0.5 MG tablet Commonly known as: ATIVAN Take 0.5 tablets (0.25 mg total) by mouth every 8 (eight) hours as needed for anxiety.   NON  FORMULARY Diet Regular Special Instructions: Give prune juice daily with breakfast   OCUSOFT BABY EYELID & EYELASH EX Apply topically. 1 PAD BOTH EYES; CLEAN THE LIDS 2 TIMES A DAY   predniSONE 1 MG tablet Commonly known as: DELTASONE Take 3 mg by mouth daily.   sertraline 100 MG tablet Commonly known as: ZOLOFT Take 100 mg by mouth daily. Take with 25 mg = 125 mg.   sertraline 25 MG tablet Commonly known as: ZOLOFT Take 25 mg by mouth daily. 100 mg +25 mg= 125 mg   therapeutic multivitamin-minerals tablet Take 1 tablet by mouth daily.   THERATEARS OP Apply 1 drop to eye 4 (four) times daily. Both eyes   UNABLE TO FIND Med Name: 120 SAL AC 30% in Petrolatum cream. Apply to right and left cheek dark spots at bedtime.      ROS was provided with assistance of staff.  Review of Systems  Constitutional: Negative for activity change, appetite change, fatigue and fever.  HENT: Positive for hearing loss. Negative for congestion and voice change.   Eyes: Negative for visual disturbance.  Respiratory: Negative for cough and shortness of breath.   Cardiovascular: Negative for chest pain and leg swelling.  Gastrointestinal: Negative for abdominal distention, abdominal pain and constipation.  Genitourinary: Negative for difficulty urinating, dysuria, frequency and urgency.  Musculoskeletal: Positive for arthralgias, gait problem and myalgias.  Skin: Negative for color change.  Neurological: Negative for facial asymmetry, speech difficulty, weakness, light-headedness and headaches.       Dementia  Psychiatric/Behavioral: Positive for agitation, behavioral problems and hallucinations. Negative for sleep disturbance. The patient is not nervous/anxious.        Occasional.     Immunization History  Administered Date(s) Administered  . Influenza Whole 05/28/2012, 06/01/2018  . Influenza, High Dose Seasonal PF 06/08/2016  . Influenza-Unspecified 05/28/2014, 08/28/2014, 05/27/2015,  06/18/2017  . Moderna SARS-COVID-2 Vaccination 08/30/2019, 09/27/2019  . Pneumococcal Conjugate-13 01/21/2014  . Pneumococcal Polysaccharide-23 04/16/2015  . Td 09/29/2003  . Zoster 01/14/2011   Pertinent  Health Maintenance Due  Topic Date Due  . INFLUENZA VACCINE  Completed  . DEXA SCAN  Completed  . PNA vac Low Risk Adult  Completed   Fall Risk  05/17/2018 05/10/2017 05/01/2017 07/17/2016 06/09/2015  Falls in the past year? No No No Yes No  Comment - - - tripped over the last stair in the house -  Number falls in past yr: - - - 1 -   Functional Status Survey:    Vitals:   11/24/19 1054  BP: 138/84  Pulse: 78  Resp: 19  Temp: 97.6 F (36.4 C)  SpO2: 94%  Weight: 171 lb 3.2 oz (77.7 kg)  Height: 5' 6" (1.676 m)   Body mass index is 27.63  kg/m. Physical Exam Vitals and nursing note reviewed.  Constitutional:      General: She is not in acute distress.    Appearance: Normal appearance. She is not ill-appearing.  HENT:     Head: Normocephalic and atraumatic.     Mouth/Throat:     Mouth: Mucous membranes are moist.  Eyes:     Extraocular Movements: Extraocular movements intact.     Conjunctiva/sclera: Conjunctivae normal.     Pupils: Pupils are equal, round, and reactive to light.  Cardiovascular:     Rate and Rhythm: Normal rate and regular rhythm.     Heart sounds: No murmur.  Pulmonary:     Breath sounds: No rales.  Abdominal:     General: Bowel sounds are normal. There is no distension.     Palpations: Abdomen is soft.     Tenderness: There is no abdominal tenderness.  Musculoskeletal:     Cervical back: Normal range of motion and neck supple.     Right lower leg: No edema.     Left lower leg: No edema.  Skin:    General: Skin is warm and dry.  Neurological:     General: No focal deficit present.     Mental Status: She is alert. Mental status is at baseline.     Motor: No weakness.     Coordination: Coordination normal.     Gait: Gait abnormal.      Comments: Oriented to self. Ambulates with walker.   Psychiatric:     Comments: Confused. Smiled. Followed directions.      Labs reviewed: Recent Labs    03/25/19 0000 05/08/19 0000 10/02/19 0000  NA 139 143 141  K 4.2 3.7 4.1  CL 102 105 107  CO2 28 31 25*  BUN _0 CREATININE 0.7 0.7 0.6  CALCIUM 10.0 9.5 8.9   Recent Labs    03/25/19 0000 05/08/19 0000 10/02/19 0000  AST _1 ALT _2 ALKPHOS 69 76 68  PROT 6.5 5.6*  --   ALBUMIN 3.9 3.7 3.6   Recent Labs    12/10/18 0000 12/10/18 0000 03/25/19 0000 05/08/19 0000 10/02/19 0000  WBC 7.8   < > 10.9 6.8 7.2  NEUTROABS  --   --   --   --  3,586  HGB 13.7   < > 15.4 13.6 14.1  HCT 41   < > 46 41 42  PLT 220  --  287 235  --    < > = values in this interval not displayed.   Lab Results  Component Value Date   TSH 2.77 04/09/2018   Lab Results  Component Value Date   HGBA1C 5.3 04/26/2017   Lab Results  Component Value Date   CHOL 217 (A) 04/09/2018   HDL 51 04/09/2018   LDLCALC 147 04/09/2018   LDLDIRECT 173.0 08/07/2012   TRIG 86 04/09/2018   CHOLHDL 5 03/20/2016    Significant Diagnostic Results in last 30 days:  No results found.  Assessment/Plan: Acute confusion Reported 11/22/19 the patient increased confusion, unable to understand what was being said to her, word salad, hallucinations, was having "phone" conversation with her daughter during lunch with no phone. Update CBC/diff, CMP/eGFR MOST limited additional interventions, ABT/IVF determine use/a defined trial period.   Hallucination Hx of depression, dementia, may consider Seroquel 12.1m qd if CBC/diff, CMP/eGFR unremarkable.   Depression, recurrent ([place] Her mood is stable, continue Sertraline, prn Lorazepam.  Essential hypertension, benign Blood pressure is controlled, continue Atenolol.     Family/ staff Communication: plan of care reviewed with the patient and charge nurse.   Labs/tests ordered:  CBC/diff,  CMP/eGFR  Time spend 25 minutes.

## 2019-11-24 NOTE — Assessment & Plan Note (Signed)
Blood pressure is controlled, continue Atenolol.  °

## 2019-11-24 NOTE — Assessment & Plan Note (Signed)
Her mood is stable, continue Sertraline, prn Lorazepam.

## 2019-11-24 NOTE — Assessment & Plan Note (Signed)
Hx of depression, dementia, may consider Seroquel 12.90m qd if CBC/diff, CMP/eGFR unremarkable.

## 2019-11-24 NOTE — Assessment & Plan Note (Signed)
Reported 11/22/19 the patient increased confusion, unable to understand what was being said to her, word salad, hallucinations, was having "phone" conversation with her daughter during lunch with no phone. Update CBC/diff, CMP/eGFR MOST limited additional interventions, ABT/IVF determine use/a defined trial period.

## 2019-11-25 DIAGNOSIS — I10 Essential (primary) hypertension: Secondary | ICD-10-CM | POA: Diagnosis not present

## 2019-11-25 DIAGNOSIS — H903 Sensorineural hearing loss, bilateral: Secondary | ICD-10-CM | POA: Diagnosis not present

## 2019-11-25 LAB — BASIC METABOLIC PANEL
BUN: 20 (ref 4–21)
CO2: 29 — AB (ref 13–22)
Chloride: 104 (ref 99–108)
Creatinine: 0.7 (ref 0.5–1.1)
Glucose: 92
Potassium: 3.8 (ref 3.4–5.3)
Sodium: 140 (ref 137–147)

## 2019-11-25 LAB — COMPREHENSIVE METABOLIC PANEL
Albumin: 3.6 (ref 3.5–5.0)
Calcium: 9.4 (ref 8.7–10.7)
GFR calc Af Amer: 88
GFR calc non Af Amer: 76
Globulin: 2

## 2019-11-25 LAB — CBC AND DIFFERENTIAL
HCT: 42 (ref 36–46)
Hemoglobin: 13.8 (ref 12.0–16.0)
Neutrophils Absolute: 4163
Platelets: 224 (ref 150–399)
WBC: 8.1

## 2019-11-25 LAB — HEPATIC FUNCTION PANEL
ALT: 15 (ref 7–35)
AST: 19 (ref 13–35)
Alkaline Phosphatase: 68 (ref 25–125)
Bilirubin, Total: 0.5

## 2019-11-25 LAB — CBC: RBC: 4.78 (ref 3.87–5.11)

## 2019-12-16 DIAGNOSIS — M79672 Pain in left foot: Secondary | ICD-10-CM | POA: Diagnosis not present

## 2019-12-16 DIAGNOSIS — B351 Tinea unguium: Secondary | ICD-10-CM | POA: Diagnosis not present

## 2019-12-16 DIAGNOSIS — M79671 Pain in right foot: Secondary | ICD-10-CM | POA: Diagnosis not present

## 2019-12-18 ENCOUNTER — Encounter: Payer: Self-pay | Admitting: Internal Medicine

## 2019-12-18 ENCOUNTER — Non-Acute Institutional Stay (SKILLED_NURSING_FACILITY): Payer: Medicare PPO | Admitting: Internal Medicine

## 2019-12-18 DIAGNOSIS — F418 Other specified anxiety disorders: Secondary | ICD-10-CM

## 2019-12-18 DIAGNOSIS — G301 Alzheimer's disease with late onset: Secondary | ICD-10-CM

## 2019-12-18 DIAGNOSIS — M353 Polymyalgia rheumatica: Secondary | ICD-10-CM

## 2019-12-18 DIAGNOSIS — I10 Essential (primary) hypertension: Secondary | ICD-10-CM | POA: Diagnosis not present

## 2019-12-18 DIAGNOSIS — F0280 Dementia in other diseases classified elsewhere without behavioral disturbance: Secondary | ICD-10-CM

## 2019-12-18 NOTE — Progress Notes (Signed)
Location:  [facility] Room Number: 110-A Place of Service:  SNF [phone]) Provider:  Einar Crow, MD  Patient Care Team: Mast, Man X, NP as PCP - General (Internal Medicine) Donnetta Hail, MD (Rheumatology) Ernesto Rutherford, MD (Ophthalmology) Nita Sells, MD (Dermatology) Crist Fat, MD as Attending Physician (Urology) Mast, Man X, NP as Nurse Practitioner (Internal Medicine)  Extended Emergency Contact Information Primary Emergency Contact: McClanahan,Susan Address: [address] [place], [place] [address] Darden Amber of [place] Home Phone: [phone] Mobile Phone: [phone] Relation: Daughter Secondary Emergency Contact: Gardiner Sleeper [place] of [place] Home Phone: [phone] Relation: Daughter  Code Status:  DNR Goals of care: Advanced Directive information Advanced Directives 12/18/2019  Does Patient Have a Medical Advance Directive? Yes  Type of Advance Directive Out of facility DNR (pink MOST or yellow form)  Does patient want to make changes to medical advance directive? No - Patient declined  Copy of Healthcare Power of Attorney in Chart? -  Would patient like information on creating a medical advance directive? -  Pre-existing out of facility DNR order (yellow form or pink MOST form) Yellow form placed in chart (order not valid for inpatient use);Pink MOST form placed in chart (order not valid for inpatient use)     Chief Complaint  Patient presents with  . Medical Management of Chronic Issues    Routine [facility] visit    HPI:  Pt is a 86 y.o. female seen today for medical management of chronic diseases.     Patient has h/o Frontal temporal Dementia with aphasia, Hypertension, PMR on Chronic Prednisone, Anxiety with Depression Now is in Memory unit due to her behavior issues including Elopement from the facility.  Does have episodes of Behavior issues including Resisting care.  But overall has stabilized here now. No New Nursing issues.  Weight stable Would not go to her room for me to examine her. But did not have any complains.   Past Medical History:  Diagnosis Date  . Acute bronchitis 06/27/2015  . Alzheimer's dementia ([facility])   . Cataract   . Cellulitis of left leg   . Depression   . Eczema   . High cholesterol   . History of renal stone    excision  . Hypertension   . Mild cognitive impairment   . Osteoarthritis   . Polymyalgia rheumatica ([facility])    rx with prednisone  . Urinary incontinence   . Varicose veins   . Xerosis of skin    Past Surgical History:  Procedure Laterality Date  . cataract surgery    . EYE SURGERY    . JOINT REPLACEMENT Bilateral    Dr. Trudee Grip  . kidney stone surgey    . lipoma removal      Allergies  Allergen Reactions  . Donepezil Other (See Comments)    BAD DREAMS  . Namenda [Memantine]   . Remeron [Mirtazapine] Other (See Comments)    Dizzy     Outpatient Encounter Medications as of 12/18/2019  Medication Sig  . acetaminophen (TYLENOL) 500 MG tablet Take 2 tablets (1,000 mg total) by mouth every 8 (eight) hours as needed.  Marland Kitchen atenolol (TENORMIN) 25 MG tablet Take 25 mg by mouth daily.  . calcium carbonate (OSCAL) 1500 (600 Ca) MG TABS tablet Take 600 mg of elemental calcium by mouth 2 (two) times daily with a meal.  . Carboxymethylcellulose Sodium (THERATEARS OP)  Apply 1 drop to eye 4 (four) times daily. Both eyes  . cholecalciferol (VITAMIN D) 1000 units tablet Take 1,000 Units by mouth daily.  . Eyelid Cleansers (OCUSOFT BABY EYELID & EYELASH EX) Apply topically. 1 PAD BOTH EYES; CLEAN THE LIDS 2 TIMES A DAY  . hydrocortisone cream 1 % Apply 1 application topically 2 (two) times daily as needed. Apply to the chest   . LORazepam (ATIVAN) 0.5 MG tablet Take 0.5 tablets (0.25 mg total) by mouth every 8 (eight) hours as needed for anxiety.  . NON FORMULARY Diet Regular Special Instructions: Give prune juice  daily with breakfast  . Omega-3 Fatty Acids (FISH OIL) 1200 MG CAPS Take 1 capsule by mouth daily.   . predniSONE (DELTASONE) 1 MG tablet Take 3 mg by mouth daily.   . sertraline (ZOLOFT) 100 MG tablet Take 100 mg by mouth daily. Take with 25 mg = 125 mg.  . sertraline (ZOLOFT) 25 MG tablet Take 25 mg by mouth daily. 100 mg +25 mg= 125 mg  . therapeutic multivitamin-minerals (THERAGRAN-M) tablet Take 1 tablet by mouth daily.  . [DISCONTINUED] UNABLE TO FIND Med Name: 120 SAL AC 30% in Petrolatum cream. Apply to right and left cheek dark spots at bedtime.   No facility-administered encounter medications on file as of 12/18/2019.    Review of Systems  Unable to perform ROS: Dementia    Immunization History  Administered Date(s) Administered  . Influenza Whole 05/28/2012, 06/01/2018  . Influenza, High Dose Seasonal PF 06/08/2016  . Influenza-Unspecified 05/28/2014, 08/28/2014, 05/27/2015, 06/18/2017  . Moderna SARS-COVID-2 Vaccination 08/30/2019, 09/27/2019  . Pneumococcal Conjugate-13 01/21/2014  . Pneumococcal Polysaccharide-23 04/16/2015  . Td 09/29/2003  . Zoster 01/14/2011   Pertinent  Health Maintenance Due  Topic Date Due  . INFLUENZA VACCINE  03/28/2020  . DEXA SCAN  Completed  . PNA vac Low Risk Adult  Completed   Fall Risk  05/17/2018 05/10/2017 05/01/2017 07/17/2016 06/09/2015  Falls in the past year? No No No Yes No  Comment - - - tripped over the last stair in the house -  Number falls in past yr: - - - 1 -   Functional Status Survey:    Vitals:   12/18/19 1402  BP: 122/64  Pulse: 62  Resp: 19  Temp: 97.6 F (36.4 C)  TempSrc: Oral  SpO2: 95%  Weight: 171 lb 1.6 oz (77.6 kg)  Height: 5\' 6"  (1.676 m)   Body mass index is 27.62 kg/m. Physical Exam Constitutional: . Well-developed and well-nourished.  HENT:  Head: Normocephalic.  Mouth/Throat: Oropharynx is clear and moist.  Eyes: Pupils are equal, round, and reactive to light.  Neck: Neck supple.    Cardiovascular: Normal rate and normal heart sounds.  No murmur heard. Pulmonary/Chest: Effort normal and breath sounds normal. No respiratory distress. No wheezes. She has no rales.  Abdominal: Soft. Bowel sounds are normal. No distension. There is no tenderness. There is no rebound.  Musculoskeletal:  Mild edema with Chronic Venous changes Lymphadenopathy: none Neurological: No Focal Deficits. Walks with Gilford Rile.  Skin: Skin is warm and dry.  Psychiatric: Normal mood and affect. Behavior is normal. Thought content normal.   Labs reviewed: Recent Labs    03/25/19 0000 05/08/19 0000 10/02/19 0000  NA 139 143 141  K 4.2 3.7 4.1  CL 102 105 107  CO2 28 31 25*  BUN 16 16 21   CREATININE 0.7 0.7 0.6  CALCIUM 10.0 9.5 8.9   Recent Labs  03/25/19 0000 05/08/19 0000 10/02/19 0000  AST 22 18 20   ALT 16 15 16   ALKPHOS 69 76 68  PROT 6.5 5.6*  --   ALBUMIN 3.9 3.7 3.6   Recent Labs    03/25/19 0000 05/08/19 0000 10/02/19 0000  WBC 10.9 6.8 7.2  NEUTROABS  --   --  3,586  HGB 15.4 13.6 14.1  HCT 46 41 42  PLT 287 235  --    Lab Results  Component Value Date   TSH 2.77 04/09/2018   Lab Results  Component Value Date   HGBA1C 5.3 04/26/2017   Lab Results  Component Value Date   CHOL 217 (A) 04/09/2018   HDL 51 04/09/2018   LDLCALC 147 04/09/2018   LDLDIRECT 173.0 08/07/2012   TRIG 86 04/09/2018   CHOLHDL 5 03/20/2016    Significant Diagnostic Results in last 30 days:  No results found.  Assessment/Plan Essential hypertension, benign- Plan:  Lower dose of Tenormin  Chronic venous insufficiency- Plan:  Supportive care Late onset Alzheimer's disease without behavioral disturbance- Plan:  Patient has not tolerated Aricept and Namenda in the past Has severe aphasia Now in Memory Unit On ativan PRN For her Behavior Polymyalgia rheumatica - Plan: Continue on Low Dose of Prednisone Symptoms Controlled -DEXA scan in 10/18 showed T score -1    depression with anxiety- Plan:  On Zoloft Staying stable NO GDR   Family/ staff Communication:   Labs/tests ordered:  Labs done Recently were all reviwed and are stable  Total time spent in this patient care encounter was  25_  minutes; greater than 50% of the visit spent  staff, reviewing records , Labs and coordinating care for problems addressed at this encounter.

## 2019-12-31 ENCOUNTER — Encounter: Payer: Self-pay | Admitting: Nurse Practitioner

## 2019-12-31 ENCOUNTER — Non-Acute Institutional Stay (SKILLED_NURSING_FACILITY): Payer: Medicare PPO | Admitting: Nurse Practitioner

## 2019-12-31 DIAGNOSIS — G301 Alzheimer's disease with late onset: Secondary | ICD-10-CM

## 2019-12-31 DIAGNOSIS — F339 Major depressive disorder, recurrent, unspecified: Secondary | ICD-10-CM

## 2019-12-31 DIAGNOSIS — I10 Essential (primary) hypertension: Secondary | ICD-10-CM

## 2019-12-31 DIAGNOSIS — M353 Polymyalgia rheumatica: Secondary | ICD-10-CM

## 2019-12-31 DIAGNOSIS — F0280 Dementia in other diseases classified elsewhere without behavioral disturbance: Secondary | ICD-10-CM

## 2019-12-31 NOTE — Assessment & Plan Note (Signed)
Advanced dementia, continue memory care unit SNF [facility] for safety, care assistance.

## 2019-12-31 NOTE — Assessment & Plan Note (Signed)
Blood pressure is controlled, continue Atenolol.  °

## 2019-12-31 NOTE — Assessment & Plan Note (Signed)
Her mood is stable, continue Sertraline, prn Lorazepam.  

## 2019-12-31 NOTE — Assessment & Plan Note (Signed)
Stable, continue Prednisone 3mg  qd.

## 2019-12-31 NOTE — Progress Notes (Signed)
Location:   [facility] Room Number: 110-A Place of Service:  SNF (31) Provider: Lennie Odor Read Bonelli NP  Derreck Wiltsey X, NP  Patient Care Team: Saavi Mceachron X, NP as PCP - General (Internal Medicine) Hennie Duos, MD (Rheumatology) Clent Jacks, MD (Ophthalmology) Allyn Kenner, MD (Dermatology) Ardis Hughs, MD as Attending Physician (Urology) Bonniejean Piano X, NP as Nurse Practitioner (Internal Medicine)  Extended Emergency Contact Information Primary Emergency Contact: McClanahan,Susan Address: [place], [place] [address] Johnnette Litter of [place] Phone: [phone] Mobile Phone: [phone] Relation: Daughter Secondary Emergency Contact: Newman Nip [place] of [place] Phone: [phone] Relation: Daughter  Code Status:  DNR Goals of care: Advanced Directive information Advanced Directives 12/31/2019  Does Patient Have a Medical Advance Directive? Yes  Type of Advance Directive Out of facility DNR (pink MOST or yellow form)  Does patient want to make changes to medical advance directive? No - Patient declined  Copy of [place] in Chart? -  Would patient like information on creating a medical advance directive? -  Pre-existing out of facility DNR order (yellow form or pink MOST form) Yellow form placed in chart (order not valid for inpatient use);Pink MOST form placed in chart (order not valid for inpatient use)     Chief Complaint  Patient presents with  . Medical Management of Chronic Issues    Routine [facility] visit    HPI:  Pt is a 86 y.o. female seen today for medical management of chronic diseases.  The patient resides in memory care unit, [facility] for safety, care assistance, ambulates with walker, her mood is stable on Sertraline 125mg  qd, prn Lorazepam 0.5mg  q8hr. Hx of HTN, blood pressure is controlled on Atenolol 25mg  qd. PMR,  stable, on Prednisone 3mg  qd.    Past  Medical History:  Diagnosis Date  . Acute bronchitis 06/27/2015  . Alzheimer's dementia ([place])   . Cataract   . Cellulitis of left leg   . Depression   . Eczema   . High cholesterol   . History of renal stone    excision  . Hypertension   . Mild cognitive impairment   . Osteoarthritis   . Polymyalgia rheumatica (HCC)    rx with prednisone  . Urinary incontinence   . Varicose veins   . Xerosis of skin    Past Surgical History:  Procedure Laterality Date  . cataract surgery    . EYE SURGERY    . JOINT REPLACEMENT Bilateral    Dr. Hector Shade  . kidney stone surgey    . lipoma removal      Allergies  Allergen Reactions  . Donepezil Other (See Comments)    BAD DREAMS  . Namenda [Memantine]   . Remeron [Mirtazapine] Other (See Comments)    Dizzy     Allergies as of 12/31/2019      Reactions   Donepezil Other (See Comments)   BAD DREAMS   Namenda [memantine]    Remeron [mirtazapine] Other (See Comments)   Dizzy       Medication List       Accurate as of Dec 31, 2019 11:59 PM. If you have any questions, ask your nurse or doctor.        acetaminophen 500 MG tablet Commonly known as: TYLENOL Take 2 tablets (1,000 mg total) by mouth every 8 (eight) hours as needed.   atenolol  25 MG tablet Commonly known as: TENORMIN Take 25 mg by mouth daily.   calcium carbonate 1500 (600 Ca) MG Tabs tablet Commonly known as: OSCAL Take 600 mg of elemental calcium by mouth 2 (two) times daily with a meal.   cholecalciferol 25 MCG (1000 UNIT) tablet Commonly known as: VITAMIN D Take 1,000 Units by mouth daily.   docusate sodium 100 MG capsule Commonly known as: COLACE Take 100 mg by mouth 2 (two) times daily.   Fish Oil 1200 MG Caps Take 1 capsule by mouth daily.   hydrocortisone cream 1 % Apply 1 application topically 2 (two) times daily as needed. Apply to the chest   LORazepam 0.5 MG tablet Commonly known as: ATIVAN Take 0.5 tablets (0.25 mg total) by mouth  every 8 (eight) hours as needed for anxiety.   NON FORMULARY Diet Regular Special Instructions: Give prune juice daily with breakfast   OCUSOFT BABY EYELID & EYELASH EX Apply topically. 1 PAD BOTH EYES; CLEAN THE LIDS 2 TIMES A DAY   predniSONE 1 MG tablet Commonly known as: DELTASONE Take 3 mg by mouth daily.   sertraline 100 MG tablet Commonly known as: ZOLOFT Take 100 mg by mouth daily. Take with 25 mg = 125 mg.   sertraline 25 MG tablet Commonly known as: ZOLOFT Take 25 mg by mouth daily. 100 mg +25 mg= 125 mg   therapeutic multivitamin-minerals tablet Take 1 tablet by mouth daily.   THERATEARS OP Apply 1 drop to eye 4 (four) times daily. Both eyes       Review of Systems  Constitutional: Negative for activity change, fatigue, fever and unexpected weight change.  HENT: Positive for hearing loss. Negative for congestion and voice change.   Eyes: Negative for visual disturbance.  Respiratory: Negative for cough and shortness of breath.   Cardiovascular: Negative for leg swelling.  Gastrointestinal: Negative for abdominal pain and constipation.  Genitourinary: Negative for dysuria, frequency and urgency.  Musculoskeletal: Positive for arthralgias, gait problem and myalgias.  Skin: Negative for color change.  Neurological: Negative for dizziness, speech difficulty and weakness.       Dementia  Psychiatric/Behavioral: Positive for agitation and behavioral problems. Negative for hallucinations and sleep disturbance. The patient is not nervous/anxious.        Occasional.     Immunization History  Administered Date(s) Administered  . Influenza Whole 05/28/2012, 06/01/2018  . Influenza, High Dose Seasonal PF 06/08/2016  . Influenza-Unspecified 05/28/2014, 08/28/2014, 05/27/2015, 06/18/2017  . Moderna SARS-COVID-2 Vaccination 08/30/2019, 09/27/2019  . Pneumococcal Conjugate-13 01/21/2014  . Pneumococcal Polysaccharide-23 04/16/2015  . Td 09/29/2003  . Zoster  01/14/2011   Pertinent  Health Maintenance Due  Topic Date Due  . INFLUENZA VACCINE  03/28/2020  . DEXA SCAN  Completed  . PNA vac Low Risk Adult  Completed   Fall Risk  05/17/2018 05/10/2017 05/01/2017 07/17/2016 06/09/2015  Falls in the past year? No No No Yes No  Comment - - - tripped over the last stair in the house -  Number falls in past yr: - - - 1 -   Functional Status Survey:    Vitals:   12/31/19 1510  BP: 120/70  Pulse: 64  Resp: 20  Temp: (!) 97.3 F (36.3 C)  TempSrc: Oral  SpO2: 96%  Weight: 175 lb (79.4 kg)  Height: 5\' 6"  (1.676 m)   Body mass index is 28.25 kg/m. Physical Exam Vitals and nursing note reviewed.  Constitutional:      Appearance: Normal appearance.  HENT:     Head: Normocephalic and atraumatic.     Nose: Nose normal.     Mouth/Throat:     Mouth: Mucous membranes are moist.  Eyes:     Extraocular Movements: Extraocular movements intact.     Conjunctiva/sclera: Conjunctivae normal.     Pupils: Pupils are equal, round, and reactive to light.  Cardiovascular:     Rate and Rhythm: Normal rate and regular rhythm.     Heart sounds: No murmur.  Pulmonary:     Breath sounds: No rales.  Abdominal:     General: Bowel sounds are normal.     Palpations: Abdomen is soft.     Tenderness: There is no abdominal tenderness.  Musculoskeletal:     Cervical back: Normal range of motion and neck supple.     Right lower leg: No edema.     Left lower leg: No edema.  Skin:    General: Skin is warm and dry.  Neurological:     General: No focal deficit present.     Mental Status: She is alert. Mental status is at baseline.     Motor: No weakness.     Coordination: Coordination normal.     Gait: Gait abnormal.     Comments: Oriented to self. Ambulates with walker.   Psychiatric:     Comments: Confused. Smiled. Followed directions.      Labs reviewed: Recent Labs    05/08/19 0000 10/02/19 0000 11/25/19 0000  NA 143 141 140  K 3.7 4.1 3.8  CL  105 107 104  CO2 31 25* 29*  BUN 16 21 20   CREATININE 0.7 0.6 0.7  CALCIUM 9.5 8.9 9.4   Recent Labs    03/25/19 0000 03/25/19 0000 05/08/19 0000 10/02/19 0000 11/25/19 0000  AST 22   < > 18 20 19   ALT 16   < > 15 16 15   ALKPHOS 69   < > 76 68 68  PROT 6.5  --  5.6*  --   --   ALBUMIN 3.9   < > 3.7 3.6 3.6   < > = values in this interval not displayed.   Recent Labs    03/25/19 0000 03/25/19 0000 05/08/19 0000 10/02/19 0000 11/25/19 0000  WBC 10.9   < > 6.8 7.2 8.1  NEUTROABS  --   --   --  3,586 4,163  HGB 15.4   < > 13.6 14.1 13.8  HCT 46   < > 41 42 42  PLT 287  --  235  --  224   < > = values in this interval not displayed.   Lab Results  Component Value Date   TSH 2.77 04/09/2018   Lab Results  Component Value Date   HGBA1C 5.3 04/26/2017   Lab Results  Component Value Date   CHOL 217 (A) 04/09/2018   HDL 51 04/09/2018   LDLCALC 147 04/09/2018   LDLDIRECT 173.0 08/07/2012   TRIG 86 04/09/2018   CHOLHDL 5 03/20/2016    Significant Diagnostic Results in last 30 days:  No results found.  Assessment/Plan  Essential hypertension, benign Blood pressure is controlled, continue Atenolol.   Late onset Alzheimer's disease without behavioral disturbance (HCC) Advanced dementia, continue memory care unit SNF [facility] for safety, care assistance.   Depression, recurrent (HCC) Her mood is stable, continue Sertraline, prn Lorazepam.   Polymyalgia rheumatica (HCC) Stable, continue Prednisone 3mg  qd.    Family/ staff Communication: plan of care reviewed with the  patient and charge nurse.   Labs/tests ordered:  none  Time spend 25 minutes.

## 2020-01-01 ENCOUNTER — Encounter: Payer: Self-pay | Admitting: Nurse Practitioner

## 2020-01-28 ENCOUNTER — Non-Acute Institutional Stay (SKILLED_NURSING_FACILITY): Payer: Medicare PPO | Admitting: Nurse Practitioner

## 2020-01-28 ENCOUNTER — Encounter: Payer: Self-pay | Admitting: Nurse Practitioner

## 2020-01-28 DIAGNOSIS — G301 Alzheimer's disease with late onset: Secondary | ICD-10-CM

## 2020-01-28 DIAGNOSIS — M353 Polymyalgia rheumatica: Secondary | ICD-10-CM

## 2020-01-28 DIAGNOSIS — F339 Major depressive disorder, recurrent, unspecified: Secondary | ICD-10-CM

## 2020-01-28 DIAGNOSIS — F0280 Dementia in other diseases classified elsewhere without behavioral disturbance: Secondary | ICD-10-CM | POA: Diagnosis not present

## 2020-01-28 DIAGNOSIS — K5901 Slow transit constipation: Secondary | ICD-10-CM | POA: Diagnosis not present

## 2020-01-28 DIAGNOSIS — I10 Essential (primary) hypertension: Secondary | ICD-10-CM | POA: Diagnosis not present

## 2020-01-28 DIAGNOSIS — K649 Unspecified hemorrhoids: Secondary | ICD-10-CM

## 2020-01-28 NOTE — Assessment & Plan Note (Signed)
External, multiple, no active bleeding or injury upon my examination, will apply 2.5% Hydrocortisone cream bid x 10 days, FOBT negative, hard stool felt in the vault of rectum, adding MiraLax qd.

## 2020-01-28 NOTE — Progress Notes (Signed)
Location:   [facility]NF [facility] Room Number: 110 Place of Service:  SNF (31) Provider: Arna Snipe Kelbi Renstrom NP  Wetzel Meester X, NP  Patient Care Team: Ka Flammer X, NP as PCP - General (Internal Medicine) Donnetta Hail, MD (Rheumatology) Ernesto Rutherford, MD (Ophthalmology) Nita Sells, MD (Dermatology) Crist Fat, MD as Attending Physician (Urology) Fatina Sprankle X, NP as Nurse Practitioner (Internal Medicine)  Extended Emergency Contact Information Primary Emergency Contact: McClanahan,Susan Address: [address] [place], [place] [address] Darden Amber of [place] Home Phone: [phone] Mobile Phone: [phone] Relation: Daughter Secondary Emergency Contact: Gardiner Sleeper [place] of [place] Home Phone: [phone] Relation: Daughter  Code Status:  DNR Goals of care: Advanced Directive information Advanced Directives 01/28/2020  Does Patient Have a Medical Advance Directive? Yes  Type of Advance Directive Out of facility DNR (pink MOST or yellow form)  Does patient want to make changes to medical advance directive? No - Patient declined  Copy of Healthcare Power of Attorney in Chart? -  Would patient like information on creating a medical advance directive? -  Pre-existing out of facility DNR order (yellow form or pink MOST form) Yellow form placed in chart (order not valid for inpatient use);Pink MOST form placed in chart (order not valid for inpatient use)     Chief Complaint  Patient presents with   Medical Management of Chronic Issues    Routine [facility] visit    HPI:  Pt is a 86 y.o. female seen today for medical management of chronic diseases.    The patient resides in memory care unit [facility] for safety, care assistance, ambulates with walker, her mood is stable, on Sertraline 125mg  qd, prn Lorazepam.  Hx of hemorrhoids, reported bleeding last night, FOBT negative today, felt hard stool during rectal exam, takes Colace  bid, prn Bisacodyl suppository prn. HTN, blood pressure is controlled, on Atenolol 25mg  qd. PMR, stable, on Prednisone 3mg  qd.    Past Medical History:  Diagnosis Date   Acute bronchitis 06/27/2015   Alzheimer's dementia (HCC)    Cataract    Cellulitis of left leg    Depression    Eczema    High cholesterol    History of renal stone    excision   Hypertension    Mild cognitive impairment    Osteoarthritis    Polymyalgia rheumatica (HCC)    rx with prednisone   Urinary incontinence    Varicose veins    Xerosis of skin    Past Surgical History:  Procedure Laterality Date   cataract surgery     EYE SURGERY     JOINT REPLACEMENT Bilateral    Dr.   kidney stone surgey     lipoma removal      Allergies  Allergen Reactions   Donepezil Other (See Comments)    BAD DREAMS   Namenda [Memantine]    Remeron [Mirtazapine] Other (See Comments)    Dizzy     Allergies as of 01/28/2020      Reactions   Donepezil Other (See Comments)   BAD DREAMS   Namenda [memantine]    Remeron [mirtazapine] Other (See Comments)   Dizzy       Medication List       Accurate as of January 28, 2020  3:36 PM. If you have any questions, ask your nurse or doctor.        acetaminophen 500  MG tablet Commonly known as: TYLENOL Take 1,000 mg by mouth every 6 (six) hours as needed. What changed: Another medication with the same name was removed. Continue taking this medication, and follow the directions you see here. Changed by: Janee Ureste X Nickcole Bralley, NP   atenolol 25 MG tablet Commonly known as: TENORMIN Take 25 mg by mouth daily.   bisacodyl 10 MG suppository Commonly known as: DULCOLAX Place 10 mg rectally daily as needed for moderate constipation.   calcium carbonate 1500 (600 Ca) MG Tabs tablet Commonly known as: OSCAL Take 600 mg of elemental calcium by mouth 2 (two) times daily with a meal.   cholecalciferol 25 MCG (1000 UNIT) tablet Commonly known as: VITAMIN  D Take 1,000 Units by mouth daily.   docusate sodium 100 MG capsule Commonly known as: COLACE Take 100 mg by mouth 2 (two) times daily.   Fish Oil 1200 MG Caps Take 1 capsule by mouth daily.   hydrocortisone 2.5 % rectal cream Commonly known as: ANUSOL-HC Place 1 application rectally 2 (two) times daily. Start taking on: January 29, 2020   hydrocortisone cream 1 % Apply 1 application topically 2 (two) times daily as needed. Apply to the chest   LORazepam 0.5 MG tablet Commonly known as: ATIVAN Take 0.5 tablets (0.25 mg total) by mouth every 8 (eight) hours as needed for anxiety.   NON FORMULARY Diet Regular Special Instructions: Give prune juice daily with breakfast   OCUSOFT BABY EYELID & EYELASH EX Apply topically. 1 PAD BOTH EYES; CLEAN THE LIDS 2 TIMES A DAY   polyethylene glycol 17 g packet Commonly known as: MIRALAX / GLYCOLAX Take 17 g by mouth daily.   predniSONE 1 MG tablet Commonly known as: DELTASONE Take 3 mg by mouth daily.   sertraline 100 MG tablet Commonly known as: ZOLOFT Take 100 mg by mouth daily. Take with 25 mg = 125 mg.   sertraline 25 MG tablet Commonly known as: ZOLOFT Take 25 mg by mouth daily. 100 mg +25 mg= 125 mg   therapeutic multivitamin-minerals tablet Take 1 tablet by mouth daily.   THERATEARS OP Apply 1 drop to eye 4 (four) times daily. Both eyes       Review of Systems  Constitutional: Negative for activity change, fever and unexpected weight change.  HENT: Positive for hearing loss. Negative for congestion and voice change.   Eyes: Negative for visual disturbance.  Respiratory: Negative for cough and shortness of breath.   Cardiovascular: Negative for leg swelling.  Gastrointestinal: Positive for anal bleeding and constipation. Negative for abdominal pain, blood in stool, diarrhea, nausea, rectal pain and vomiting.  Genitourinary: Negative for dysuria, frequency and urgency.  Musculoskeletal: Positive for arthralgias, gait  problem and myalgias.  Skin: Negative for color change.  Neurological: Negative for speech difficulty, weakness, light-headedness and headaches.       Dementia  Psychiatric/Behavioral: Positive for agitation and behavioral problems. Negative for sleep disturbance. The patient is not nervous/anxious.        Occasional.     Immunization History  Administered Date(s) Administered   Influenza Whole 05/28/2012, 06/01/2018   Influenza, High Dose Seasonal PF 06/08/2016   Influenza-Unspecified 05/28/2014, 08/28/2014, 05/27/2015, 06/18/2017   Moderna SARS-COVID-2 Vaccination 08/30/2019, 09/27/2019   Pneumococcal Conjugate-13 01/21/2014   Pneumococcal Polysaccharide-23 04/16/2015   Td 09/29/2003   Zoster 01/14/2011   Pertinent  Health Maintenance Due  Topic Date Due   INFLUENZA VACCINE  03/28/2020   DEXA SCAN  Completed   PNA vac Low  Risk Adult  Completed   Fall Risk  05/17/2018 05/10/2017 05/01/2017 07/17/2016 06/09/2015  Falls in the past year? No No No Yes No  Comment - - - tripped over the last stair in the house -  Number falls in past yr: - - - 1 -   Functional Status Survey:    Vitals:   01/28/20 1456  BP: (!) 160/80  Pulse: 72  Resp: 20  Temp: 98 F (36.7 C)  TempSrc: Oral  SpO2: 97%  Weight: 173 lb 3.2 oz (78.6 kg)  Height: 5\' 6"  (1.676 m)   Body mass index is 27.96 kg/m. Physical Exam Vitals and nursing note reviewed.  Constitutional:      Appearance: Normal appearance.  HENT:     Head: Normocephalic and atraumatic.     Mouth/Throat:     Mouth: Mucous membranes are moist.  Eyes:     Extraocular Movements: Extraocular movements intact.     Conjunctiva/sclera: Conjunctivae normal.     Pupils: Pupils are equal, round, and reactive to light.  Cardiovascular:     Rate and Rhythm: Normal rate and regular rhythm.     Heart sounds: No murmur.  Pulmonary:     Breath sounds: No rales.  Abdominal:     General: Bowel sounds are normal. There is no  distension.     Palpations: Abdomen is soft.     Tenderness: There is no abdominal tenderness. There is no right CVA tenderness, left CVA tenderness, guarding or rebound.  Genitourinary:    Rectum: Guaiac result negative.     Comments: Multiple external hemorrhoids, no active bleeding or s/s of injury.  Musculoskeletal:     Cervical back: Normal range of motion and neck supple.     Right lower leg: No edema.     Left lower leg: No edema.  Skin:    General: Skin is warm and dry.  Neurological:     General: No focal deficit present.     Mental Status: She is alert. Mental status is at baseline.     Gait: Gait abnormal.     Comments: Oriented to self. Ambulates with walker.   Psychiatric:     Comments: Confused. Smiled. Followed directions.      Labs reviewed: Recent Labs    05/08/19 0000 10/02/19 0000 11/25/19 0000  NA 143 141 140  K 3.7 4.1 3.8  CL 105 107 104  CO2 31 25* 29*  BUN 16 21 20   CREATININE 0.7 0.6 0.7  CALCIUM 9.5 8.9 9.4   Recent Labs    03/25/19 0000 03/25/19 0000 05/08/19 0000 10/02/19 0000 11/25/19 0000  AST 22   < > 18 20 19   ALT 16   < > 15 16 15   ALKPHOS 69   < > 76 68 68  PROT 6.5  --  5.6*  --   --   ALBUMIN 3.9   < > 3.7 3.6 3.6   < > = values in this interval not displayed.   Recent Labs    03/25/19 0000 03/25/19 0000 05/08/19 0000 10/02/19 0000 11/25/19 0000  WBC 10.9   < > 6.8 7.2 8.1  NEUTROABS  --   --   --  3,586 4,163  HGB 15.4   < > 13.6 14.1 13.8  HCT 46   < > 41 42 42  PLT 287  --  235  --  224   < > = values in this interval not displayed.   Lab Results  Component Value Date   TSH 2.77 04/09/2018   Lab Results  Component Value Date   HGBA1C 5.3 04/26/2017   Lab Results  Component Value Date   CHOL 217 (A) 04/09/2018   HDL 51 04/09/2018   LDLCALC 147 04/09/2018   LDLDIRECT 173.0 08/07/2012   TRIG 86 04/09/2018   CHOLHDL 5 03/20/2016    Significant Diagnostic Results in last 30 days:  No results  found.  Assessment/Plan  Hemorrhoids External, multiple, no active bleeding or injury upon my examination, will apply 2.5% Hydrocortisone cream bid x 10 days, FOBT negative, hard stool felt in the vault of rectum, adding MiraLax qd.   Slow transit constipation Bisacodyl suppository x1, adding MiraLax qd.   Essential hypertension, benign Mild elevated Sbp, asymptomatic, continue Atenolol.   Late onset Alzheimer's disease without behavioral disturbance (HCC) Continue memory care unit for supportive care.   Depression, recurrent (HCC) Her mood is stable, continue Sertraline, prn Lorazepam.   Polymyalgia rheumatica (HCC) Stable, continue Prednisone 3mg  qd.    Family/ staff Communication: plan of care reviewed with the patient and charge nurse.   Labs/tests ordered: none  Time spend 25 minutes.

## 2020-01-28 NOTE — Assessment & Plan Note (Signed)
Continue memory care unit for supportive care.  

## 2020-01-28 NOTE — Assessment & Plan Note (Signed)
Bisacodyl suppository x1, adding MiraLax qd.

## 2020-01-28 NOTE — Assessment & Plan Note (Signed)
Her mood is stable, continue Sertraline, prn Lorazepam.

## 2020-01-28 NOTE — Assessment & Plan Note (Signed)
Stable, continue Prednisone 3mg qd.  

## 2020-01-28 NOTE — Assessment & Plan Note (Signed)
Mild elevated Sbp, asymptomatic, continue Atenolol.

## 2020-03-09 ENCOUNTER — Non-Acute Institutional Stay (SKILLED_NURSING_FACILITY): Payer: Medicare PPO | Admitting: Internal Medicine

## 2020-03-09 ENCOUNTER — Encounter: Payer: Self-pay | Admitting: Internal Medicine

## 2020-03-09 DIAGNOSIS — I10 Essential (primary) hypertension: Secondary | ICD-10-CM | POA: Diagnosis not present

## 2020-03-09 DIAGNOSIS — G301 Alzheimer's disease with late onset: Secondary | ICD-10-CM

## 2020-03-09 DIAGNOSIS — F0280 Dementia in other diseases classified elsewhere without behavioral disturbance: Secondary | ICD-10-CM

## 2020-03-09 DIAGNOSIS — M353 Polymyalgia rheumatica: Secondary | ICD-10-CM

## 2020-03-09 DIAGNOSIS — F339 Major depressive disorder, recurrent, unspecified: Secondary | ICD-10-CM | POA: Diagnosis not present

## 2020-03-09 NOTE — Progress Notes (Signed)
Location:    [facility] Home Room Number: 110 Place of Service:  SNF (31) Provider:  Mahlon Gammon., MD  Mast, Man X, NP  Patient Care Team: Mast, Man X, NP as PCP - General (Internal Medicine) Donnetta Hail, MD (Rheumatology) Ernesto Rutherford, MD (Ophthalmology) Nita Sells, MD (Dermatology) Crist Fat, MD as Attending Physician (Urology) Mast, Man X, NP as Nurse Practitioner (Internal Medicine)  Extended Emergency Contact Information Primary Emergency Contact: McClanahan,Susan Address: [address] [place], [place] [address] [place] of [place] Home Phone: [phone] Mobile Phone: [phone] Relation: Daughter Secondary Emergency Contact: Gardiner Sleeper [place] of [place] Home Phone: [phone] Relation: Daughter  Code Status:  DNR Goals of care: Advanced Directive information Advanced Directives 03/09/2020  Does Patient Have a Medical Advance Directive? Yes  Type of Estate agent of [place];Out of facility DNR (pink MOST or yellow form)  Does patient want to make changes to medical advance directive? No - Patient declined  Copy of Healthcare Power of Attorney in Chart? No - copy requested  Would patient like information on creating a medical advance directive? -  Pre-existing out of facility DNR order (yellow form or pink MOST form) Yellow form placed in chart (order not valid for inpatient use);Pink MOST form placed in chart (order not valid for inpatient use)     Chief Complaint  Patient presents with  . Medical Management of Chronic Issues    Routine visit    HPI:  Pt is a 86 y.o. female seen today for medical management of chronic diseases.    Patient has h/o Frontal temporal Dementia with aphasia, Hypertension, PMR on Chronic Prednisone, Anxiety with Depression Now is in Memory unit due to her behavior issues including Elopement from the facility.  Continues to have issues with behavior but  more stable now Unable to give me any history due to her Dementia. Does have aphasia Weight stable No new Nursing issues   Past Medical History:  Diagnosis Date  . Acute bronchitis 06/27/2015  . Alzheimer's dementia (HCC)   . Cataract   . Cellulitis of left leg   . Depression   . Eczema   . High cholesterol   . History of renal stone    excision  . Hypertension   . Mild cognitive impairment   . Osteoarthritis   . Polymyalgia rheumatica (HCC)    rx with prednisone  . Urinary incontinence   . Varicose veins   . Xerosis of skin    Past Surgical History:  Procedure Laterality Date  . cataract surgery    . EYE SURGERY    . JOINT REPLACEMENT Bilateral    Dr. Trudee Grip  . kidney stone surgey    . lipoma removal      Allergies  Allergen Reactions  . Donepezil Other (See Comments)    BAD DREAMS  . Namenda [Memantine]   . Remeron [Mirtazapine] Other (See Comments)    Dizzy     Allergies as of 03/09/2020      Reactions   Donepezil Other (See Comments)   BAD DREAMS   Namenda [memantine]    Remeron [mirtazapine] Other (See Comments)   Dizzy       Medication List       Accurate as of March 09, 2020  2:52 PM. If you have any questions, ask your nurse or doctor.        STOP taking these medications  bisacodyl 10 MG suppository Commonly known as: DULCOLAX Stopped by: Mahlon Gammon, MD     TAKE these medications   acetaminophen 500 MG tablet Commonly known as: TYLENOL Take 1,000 mg by mouth every 8 (eight) hours as needed.   atenolol 25 MG tablet Commonly known as: TENORMIN Take 25 mg by mouth daily.   calcium carbonate 1500 (600 Ca) MG Tabs tablet Commonly known as: OSCAL Take 600 mg of elemental calcium by mouth 2 (two) times daily with a meal.   cholecalciferol 25 MCG (1000 UNIT) tablet Commonly known as: VITAMIN D Take 1,000 Units by mouth daily.   docusate sodium 100 MG capsule Commonly known as: COLACE Take 100 mg by mouth 2 (two) times  daily.   Fish Oil 1200 MG Caps Take 1 capsule by mouth daily.   hydrocortisone cream 1 % Apply 1 application topically 2 (two) times daily as needed. Apply to the chest   LORazepam 0.5 MG tablet Commonly known as: ATIVAN Take 0.5 tablets (0.25 mg total) by mouth every 8 (eight) hours as needed for anxiety.   NON FORMULARY Diet Regular Special Instructions: Give prune juice daily with breakfast   OCUSOFT BABY EYELID & EYELASH EX Apply topically. 1 PAD BOTH EYES; CLEAN THE LIDS 2 TIMES A DAY   polyethylene glycol 17 g packet Commonly known as: MIRALAX / GLYCOLAX Take 17 g by mouth daily.   predniSONE 1 MG tablet Commonly known as: DELTASONE Take 3 mg by mouth daily.   sertraline 100 MG tablet Commonly known as: ZOLOFT Take 100 mg by mouth daily. Take with 25 mg = 125 mg.   sertraline 25 MG tablet Commonly known as: ZOLOFT Take 25 mg by mouth daily. 100 mg +25 mg= 125 mg   therapeutic multivitamin-minerals tablet Take 1 tablet by mouth daily. 400 mcg   THERATEARS OP Apply 1 drop to eye 4 (four) times daily. Both eyes       Review of Systems  Unable to perform ROS: Dementia    Immunization History  Administered Date(s) Administered  . Influenza Whole 05/28/2012, 06/01/2018  . Influenza, High Dose Seasonal PF 06/08/2016  . Influenza-Unspecified 05/28/2014, 08/28/2014, 05/27/2015, 06/18/2017  . Moderna SARS-COVID-2 Vaccination 08/30/2019, 09/27/2019  . Pneumococcal Conjugate-13 01/21/2014  . Pneumococcal Polysaccharide-23 04/16/2015  . Td 09/29/2003  . Zoster 01/14/2011   Pertinent  Health Maintenance Due  Topic Date Due  . INFLUENZA VACCINE  03/28/2020  . DEXA SCAN  Completed  . PNA vac Low Risk Adult  Completed   Fall Risk  05/17/2018 05/10/2017 05/01/2017 07/17/2016 06/09/2015  Falls in the past year? No No No Yes No  Comment - - - tripped over the last stair in the house -  Number falls in past yr: - - - 1 -   Functional Status Survey:    Vitals:    03/09/20 1437  BP: 120/80  Pulse: 60  Resp: 20  Temp: 98 F (36.7 C)  SpO2: 95%  Weight: 177 lb (80.3 kg)  Height: 5\' 6"  (1.676 m)   Body mass index is 28.57 kg/m. Physical Exam Vitals reviewed.  Constitutional:      Appearance: Normal appearance.  HENT:     Head: Normocephalic.     Nose: Nose normal.     Mouth/Throat:     Mouth: Mucous membranes are moist.     Pharynx: Oropharynx is clear.  Cardiovascular:     Rate and Rhythm: Normal rate and regular rhythm.     Pulses: Normal  pulses.  Pulmonary:     Effort: Pulmonary effort is normal.     Breath sounds: Normal breath sounds.  Abdominal:     General: Abdomen is flat. Bowel sounds are normal.     Palpations: Abdomen is soft.  Musculoskeletal:     Cervical back: Neck supple.     Comments: Mild edema with Chronic Venous changes  Skin:    General: Skin is warm.  Neurological:     General: No focal deficit present.     Mental Status: She is alert.  Psychiatric:     Comments: Some Anxious     Labs reviewed: Recent Labs    05/08/19 0000 10/02/19 0000 11/25/19 0000  NA 143 141 140  K 3.7 4.1 3.8  CL 105 107 104  CO2 31 25* 29*  BUN 16 21 20   CREATININE 0.7 0.6 0.7  CALCIUM 9.5 8.9 9.4   Recent Labs    03/25/19 0000 03/25/19 0000 05/08/19 0000 10/02/19 0000 11/25/19 0000  AST 22   < > 18 20 19   ALT 16   < > 15 16 15   ALKPHOS 69   < > 76 68 68  PROT 6.5  --  5.6*  --   --   ALBUMIN 3.9   < > 3.7 3.6 3.6   < > = values in this interval not displayed.   Recent Labs    03/25/19 0000 03/25/19 0000 05/08/19 0000 10/02/19 0000 11/25/19 0000  WBC 10.9   < > 6.8 7.2 8.1  NEUTROABS  --   --   --  3,586 4,163  HGB 15.4   < > 13.6 14.1 13.8  HCT 46   < > 41 42 42  PLT 287  --  235  --  224   < > = values in this interval not displayed.   Lab Results  Component Value Date   TSH 2.77 04/09/2018   Lab Results  Component Value Date   HGBA1C 5.3 04/26/2017   Lab Results  Component Value Date    CHOL 217 (A) 04/09/2018   HDL 51 04/09/2018   LDLCALC 147 04/09/2018   LDLDIRECT 173.0 08/07/2012   TRIG 86 04/09/2018   CHOLHDL 5 03/20/2016    Significant Diagnostic Results in last 30 days:  No results found.  Assessment/Plan Essential hypertension, benign Controlled on Low dose of Tenromin  Polymyalgia rheumatica (HCC) Low dose of Prednisone DEXA scan in 10/18 showed T score -1   Depression, recurrent (HCC) On Zoloft Stable No GDR for now Late onset Alzheimer's disease without behavioral disturbance (HCC) Patient has not tolerated Aricept and Namenda in the past Has severe aphasia Now in Memory Unit Use Ativan PRN for Behaviors   Family/ staff Communication:   Labs/tests ordered:

## 2020-03-29 ENCOUNTER — Non-Acute Institutional Stay (SKILLED_NURSING_FACILITY): Payer: Medicare PPO | Admitting: Internal Medicine

## 2020-03-29 ENCOUNTER — Encounter: Payer: Self-pay | Admitting: Internal Medicine

## 2020-03-29 DIAGNOSIS — F339 Major depressive disorder, recurrent, unspecified: Secondary | ICD-10-CM

## 2020-03-29 DIAGNOSIS — M353 Polymyalgia rheumatica: Secondary | ICD-10-CM

## 2020-03-29 DIAGNOSIS — I10 Essential (primary) hypertension: Secondary | ICD-10-CM | POA: Diagnosis not present

## 2020-03-29 DIAGNOSIS — H1013 Acute atopic conjunctivitis, bilateral: Secondary | ICD-10-CM

## 2020-03-29 DIAGNOSIS — G301 Alzheimer's disease with late onset: Secondary | ICD-10-CM

## 2020-03-29 DIAGNOSIS — F0280 Dementia in other diseases classified elsewhere without behavioral disturbance: Secondary | ICD-10-CM

## 2020-03-29 NOTE — Progress Notes (Signed)
Location:   [facility] Room Number: 110 Place of Service:  SNF [phone]) Provider:  Einar Crow MD  Mast, Man X, NP  Patient Care Team: Mast, Man X, NP as PCP - General (Internal Medicine) Donnetta Hail, MD (Rheumatology) Ernesto Rutherford, MD (Ophthalmology) Nita Sells, MD (Dermatology) Crist Fat, MD as Attending Physician (Urology) Mast, Man X, NP as Nurse Practitioner (Internal Medicine)  Extended Emergency Contact Information Primary Emergency Contact: McClanahan,Susan Address: [address] [place], [place] [address] [place] of [place] Home Phone: [phone] Mobile Phone: [phone] Relation: Daughter Secondary Emergency Contact: Gardiner Sleeper [place] of [place] Home Phone: [phone] Relation: Daughter  Code Status:  DNR Goals of care: Advanced Directive information Advanced Directives 03/09/2020  Does Patient Have a Medical Advance Directive? Yes  Type of Estate agent of [place];Out of facility DNR (pink MOST or yellow form)  Does patient want to make changes to medical advance directive? No - Patient declined  Copy of Healthcare Power of Attorney in Chart? No - copy requested  Would patient like information on creating a medical advance directive? -  Pre-existing out of facility DNR order (yellow form or pink MOST form) Yellow form placed in chart (order not valid for inpatient use);Pink MOST form placed in chart (order not valid for inpatient use)     Chief Complaint  Patient presents with  . Acute Visit    Red eyes    HPI:  Pt is a 86 y.o. female seen today for an acute visit for redness in both eyes  Patient has h/o Frontal temporal Dementia with aphasia, Hypertension, PMR on Chronic Prednisone, Anxiety with Depression Now is in Memory unit due to her behavior issues including Elopement from the facility.  Nurses had noticed redness in both her eyes but no  discharge.  Patient unable to give me much history.  Denies any pain.  Unable to know if she is having any vision issues.  Patient was rubbing both her eyes. Past Medical History:  Diagnosis Date  . Acute bronchitis 06/27/2015  . Alzheimer's dementia (HCC)   . Cataract   . Cellulitis of left leg   . Depression   . Eczema   . High cholesterol   . History of renal stone    excision  . Hypertension   . Mild cognitive impairment   . Osteoarthritis   . Polymyalgia rheumatica (HCC)    rx with prednisone  . Urinary incontinence   . Varicose veins   . Xerosis of skin    Past Surgical History:  Procedure Laterality Date  . cataract surgery    . EYE SURGERY    . JOINT REPLACEMENT Bilateral    Dr. Trudee Grip  . kidney stone surgey    . lipoma removal      Allergies  Allergen Reactions  . Donepezil Other (See Comments)    BAD DREAMS  . Namenda [Memantine]   . Remeron [Mirtazapine] Other (See Comments)    Dizzy     Allergies as of 03/29/2020      Reactions   Donepezil Other (See Comments)   BAD DREAMS   Namenda [memantine]    Remeron [mirtazapine] Other (See Comments)   Dizzy       Medication List       Accurate as of March 29, 2020 11:32 AM. If you have any questions, ask your nurse or doctor.  acetaminophen 500 MG tablet Commonly known as: TYLENOL Take 1,000 mg by mouth every 8 (eight) hours as needed.   atenolol 25 MG tablet Commonly known as: TENORMIN Take 25 mg by mouth daily.   calcium carbonate 1500 (600 Ca) MG Tabs tablet Commonly known as: OSCAL Take 600 mg of elemental calcium by mouth 2 (two) times daily with a meal.   cholecalciferol 25 MCG (1000 UNIT) tablet Commonly known as: VITAMIN D Take 1,000 Units by mouth daily.   docusate sodium 100 MG capsule Commonly known as: COLACE Take 100 mg by mouth 2 (two) times daily.   Fish Oil 1200 MG Caps Take 1 capsule by mouth daily.   hydrocortisone cream 1 % Apply 1 application topically 2  (two) times daily as needed. Apply to the chest   LORazepam 0.5 MG tablet Commonly known as: ATIVAN Take 0.5 tablets (0.25 mg total) by mouth every 8 (eight) hours as needed for anxiety.   NON FORMULARY Diet Regular Special Instructions: Give prune juice daily with breakfast   OCUSOFT BABY EYELID & EYELASH EX Apply topically. 1 PAD BOTH EYES; CLEAN THE LIDS 2 TIMES A DAY   polyethylene glycol 17 g packet Commonly known as: MIRALAX / GLYCOLAX Take 17 g by mouth daily.   predniSONE 1 MG tablet Commonly known as: DELTASONE Take 3 mg by mouth daily.   sertraline 100 MG tablet Commonly known as: ZOLOFT Take 100 mg by mouth daily. Take with 25 mg = 125 mg.   sertraline 25 MG tablet Commonly known as: ZOLOFT Take 25 mg by mouth daily. 100 mg +25 mg= 125 mg   therapeutic multivitamin-minerals tablet Take 1 tablet by mouth daily. 400 mcg   THERATEARS OP Apply 1 drop to eye 4 (four) times daily. Both eyes       Review of Systems  Unable to perform ROS: Dementia    Immunization History  Administered Date(s) Administered  . Influenza Whole 05/28/2012, 06/01/2018  . Influenza, High Dose Seasonal PF 06/08/2016  . Influenza-Unspecified 05/28/2014, 08/28/2014, 05/27/2015, 06/18/2017  . Moderna SARS-COVID-2 Vaccination 08/30/2019, 09/27/2019  . Pneumococcal Conjugate-13 01/21/2014  . Pneumococcal Polysaccharide-23 04/16/2015  . Td 09/29/2003  . Zoster 01/14/2011   Pertinent  Health Maintenance Due  Topic Date Due  . INFLUENZA VACCINE  03/28/2020  . DEXA SCAN  Completed  . PNA vac Low Risk Adult  Completed   Fall Risk  05/17/2018 05/10/2017 05/01/2017 07/17/2016 06/09/2015  Falls in the past year? No No No Yes No  Comment - - - tripped over the last stair in the house -  Number falls in past yr: - - - 1 -   Functional Status Survey:    Vitals:   03/29/20 1127  BP: 136/80  Pulse: 78  Resp: 18  Temp: 97.9 F (36.6 C)  SpO2: 95%  Weight: 177 lb (80.3 kg)  Height: 5'  6" (1.676 m)   Body mass index is 28.57 kg/m. Physical Exam Vitals reviewed.  Constitutional:      Appearance: Normal appearance.  HENT:     Head: Normocephalic.     Nose: Nose normal.     Mouth/Throat:     Mouth: Mucous membranes are moist.     Pharynx: Oropharynx is clear.  Eyes:     Pupils: Pupils are equal, round, and reactive to light.     Comments: Mild Redness in Conjunctiva No Discharge Noticed  Cardiovascular:     Rate and Rhythm: Normal rate.     Pulses:  Normal pulses.  Pulmonary:     Effort: Pulmonary effort is normal.     Breath sounds: Normal breath sounds.  Abdominal:     General: Abdomen is flat. Bowel sounds are normal.     Palpations: Abdomen is soft.  Musculoskeletal:        General: No swelling.     Cervical back: Neck supple.  Skin:    General: Skin is warm.  Neurological:     General: No focal deficit present.     Mental Status: She is alert.  Psychiatric:        Mood and Affect: Mood normal.     Labs reviewed: Recent Labs    05/08/19 0000 10/02/19 0000 11/25/19 0000  NA 143 141 140  K 3.7 4.1 3.8  CL 105 107 104  CO2 31 25* 29*  BUN 16 21 20   CREATININE 0.7 0.6 0.7  CALCIUM 9.5 8.9 9.4   Recent Labs    05/08/19 0000 10/02/19 0000 11/25/19 0000  AST 18 20 19   ALT 15 16 15   ALKPHOS 76 68 68  PROT 5.6*  --   --   ALBUMIN 3.7 3.6 3.6   Recent Labs    05/08/19 0000 10/02/19 0000 11/25/19 0000  WBC 6.8 7.2 8.1  NEUTROABS  --  3,586 4,163  HGB 13.6 14.1 13.8  HCT 41 42 42  PLT 235  --  224   Lab Results  Component Value Date   TSH 2.77 04/09/2018   Lab Results  Component Value Date   HGBA1C 5.3 04/26/2017   Lab Results  Component Value Date   CHOL 217 (A) 04/09/2018   HDL 51 04/09/2018   LDLCALC 147 04/09/2018   LDLDIRECT 173.0 08/07/2012   TRIG 86 04/09/2018   CHOLHDL 5 03/20/2016    Significant Diagnostic Results in last 30 days:  No results found.  Assessment/Plan  Allergic conjunctivitis of both  eyes Will start her on Patanol BID for 3 weeks  Essential hypertension, benign Stable on Tenormin Polymyalgia rheumatica (HCC) Low dose of Prednisone DEXA scan in 10/18 showed T score -1 Depression, recurrent (HCC) On Zoloft Stable No GDR for now Late onset Alzheimer's disease with behavioral disturbance (HCC) Patient has not tolerated Aricept and Namenda in the past Has severe aphasia Now in Memory Unit Use Ativan PRN for Behaviors     Family/ staff Communication:  Labs/tests ordered:

## 2020-04-27 ENCOUNTER — Encounter: Payer: Self-pay | Admitting: Nurse Practitioner

## 2020-04-27 ENCOUNTER — Non-Acute Institutional Stay (SKILLED_NURSING_FACILITY): Payer: Medicare PPO | Admitting: Nurse Practitioner

## 2020-04-27 DIAGNOSIS — I10 Essential (primary) hypertension: Secondary | ICD-10-CM | POA: Diagnosis not present

## 2020-04-27 DIAGNOSIS — F339 Major depressive disorder, recurrent, unspecified: Secondary | ICD-10-CM

## 2020-04-27 DIAGNOSIS — K5901 Slow transit constipation: Secondary | ICD-10-CM | POA: Diagnosis not present

## 2020-04-27 DIAGNOSIS — G301 Alzheimer's disease with late onset: Secondary | ICD-10-CM | POA: Diagnosis not present

## 2020-04-27 DIAGNOSIS — M353 Polymyalgia rheumatica: Secondary | ICD-10-CM | POA: Diagnosis not present

## 2020-04-27 DIAGNOSIS — F0280 Dementia in other diseases classified elsewhere without behavioral disturbance: Secondary | ICD-10-CM

## 2020-04-27 NOTE — Assessment & Plan Note (Signed)
Her mood is managed with Sertraline, prn Lorazepam.

## 2020-04-27 NOTE — Assessment & Plan Note (Signed)
Stable, continue Prednisone.  

## 2020-04-27 NOTE — Progress Notes (Signed)
Location:   [facility] Room Number: 110 Place of Service:  SNF (31) Provider:  Khalise Billard, Arna Snipe NP  Marjorie Deprey X, NP  Patient Care Team: Gwyn Mehring X, NP as PCP - General (Internal Medicine) Donnetta Hail, MD (Rheumatology) Ernesto Rutherford, MD (Ophthalmology) Nita Sells, MD (Dermatology) Crist Fat, MD as Attending Physician (Urology) Sylvia Kondracki X, NP as Nurse Practitioner (Internal Medicine)  Extended Emergency Contact Information Primary Emergency Contact: McClanahan,Susan Address: [address] [place], [place] [address] [place] of [place] Home Phone: [phone] Mobile Phone: [phone] Relation: Daughter Secondary Emergency Contact: Gardiner Sleeper [place] of [place] Home Phone: [phone] Relation: Daughter  Code Status:  DNR Goals of care: Advanced Directive information Advanced Directives 04/27/2020  Does Patient Have a Medical Advance Directive? Yes  Type of Advance Directive Out of facility DNR (pink MOST or yellow form);Healthcare Power of Attorney  Does patient want to make changes to medical advance directive? No - Patient declined  Copy of Healthcare Power of Attorney in Chart? Yes - validated most recent copy scanned in chart (See row information)  Would patient like information on creating a medical advance directive? -  Pre-existing out of facility DNR order (yellow form or pink MOST form) Pink MOST form placed in chart (order not valid for inpatient use);Yellow form placed in chart (order not valid for inpatient use)     Chief Complaint  Patient presents with   Medical Management of Chronic Issues    HPI:  Pt is a 86 y.o. female seen today for medical management of chronic diseases.      Depression/anxiety, takes Sertraline 125mg  qd, prn Lorazepam.    HTN, blood pressure is controlled, on Atenolol 25mg  qd.   PMR, stable, on Prednisone 3mg  qd.   Constipation, takes Colace, Miralax.    Past Medical History:  Diagnosis Date   Acute bronchitis 06/27/2015   Alzheimer's dementia (HCC)    Cataract    Cellulitis of left leg    Depression    Eczema    High cholesterol    History of renal stone    excision   Hypertension    Mild cognitive impairment    Osteoarthritis    Polymyalgia rheumatica (HCC)    rx with prednisone   Urinary incontinence    Varicose veins    Xerosis of skin    Past Surgical History:  Procedure Laterality Date   cataract surgery     EYE SURGERY     JOINT REPLACEMENT Bilateral    Dr.   kidney stone surgey     lipoma removal      Allergies  Allergen Reactions   Donepezil Other (See Comments)    BAD DREAMS   Namenda [Memantine]    Remeron [Mirtazapine] Other (See Comments)    Dizzy     Allergies as of 04/27/2020      Reactions   Donepezil Other (See Comments)   BAD DREAMS   Namenda [memantine]    Remeron [mirtazapine] Other (See Comments)   Dizzy       Medication List       Accurate as of April 27, 2020 11:59 PM. If you have any questions, ask your nurse or doctor.        STOP taking these medications   LORazepam 0.5 MG tablet Commonly known as: ATIVAN Stopped by: Selena Swaminathan X Emrick Hensch, NP     TAKE these medications  acetaminophen 500 MG tablet Commonly known as: TYLENOL Take 1,000 mg by mouth every 8 (eight) hours as needed.   atenolol 25 MG tablet Commonly known as: TENORMIN Take 25 mg by mouth daily.   calcium carbonate 1500 (600 Ca) MG Tabs tablet Commonly known as: OSCAL Take 600 mg of elemental calcium by mouth 2 (two) times daily with a meal.   cholecalciferol 25 MCG (1000 UNIT) tablet Commonly known as: VITAMIN D Take 1,000 Units by mouth daily.   docusate sodium 100 MG capsule Commonly known as: COLACE Take 100 mg by mouth 2 (two) times daily.   Fish Oil 1200 MG Caps Take 1 capsule by mouth daily.   hydrocortisone cream 1 % Apply 1 application topically 2 (two)  times daily as needed. Apply to the chest   NON FORMULARY Diet Regular Special Instructions: Give prune juice daily with breakfast   OCUSOFT BABY EYELID & EYELASH EX Apply topically. 1 PAD BOTH EYES; CLEAN THE LIDS 2 TIMES A DAY   polyethylene glycol 17 g packet Commonly known as: MIRALAX / GLYCOLAX Take 17 g by mouth daily.   predniSONE 1 MG tablet Commonly known as: DELTASONE Take 3 mg by mouth daily.   sertraline 100 MG tablet Commonly known as: ZOLOFT Take 100 mg by mouth daily. Take with 25 mg = 125 mg.   sertraline 25 MG tablet Commonly known as: ZOLOFT Take 25 mg by mouth daily. 100 mg +25 mg= 125 mg   therapeutic multivitamin-minerals tablet Take 1 tablet by mouth daily. 400 mcg   THERATEARS OP Apply 1 drop to eye 4 (four) times daily. Both eyes       Review of Systems  Constitutional: Negative for fatigue, fever and unexpected weight change.  HENT: Positive for hearing loss. Negative for congestion and voice change.   Eyes: Negative for visual disturbance.  Respiratory: Negative for cough and shortness of breath.   Cardiovascular: Negative for leg swelling.  Gastrointestinal: Negative for abdominal pain, constipation, nausea and vomiting.  Genitourinary: Negative for dysuria, frequency and urgency.  Musculoskeletal: Positive for arthralgias, gait problem and myalgias.  Skin: Negative for color change.  Neurological: Negative for dizziness, speech difficulty and weakness.       Dementia  Psychiatric/Behavioral: Positive for behavioral problems and confusion. Negative for agitation and sleep disturbance. The patient is not nervous/anxious.        Occasional.     Immunization History  Administered Date(s) Administered   Influenza Whole 05/28/2012, 06/01/2018   Influenza, High Dose Seasonal PF 06/08/2016   Influenza-Unspecified 05/28/2014, 08/28/2014, 05/27/2015, 06/18/2017   Moderna SARS-COVID-2 Vaccination 08/30/2019, 09/27/2019   Pneumococcal  Conjugate-13 01/21/2014   Pneumococcal Polysaccharide-23 04/16/2015   Td 09/29/2003   Zoster 01/14/2011   Pertinent  Health Maintenance Due  Topic Date Due   INFLUENZA VACCINE  03/28/2020   DEXA SCAN  Completed   PNA vac Low Risk Adult  Completed   Fall Risk  05/17/2018 05/10/2017 05/01/2017 07/17/2016 06/09/2015  Falls in the past year? No No No Yes No  Comment - - - tripped over the last stair in the house -  Number falls in past yr: - - - 1 -   Functional Status Survey:    Vitals:   04/27/20 1043  BP: 130/80  Pulse: 72  Resp: 18  Temp: 98.2 F (36.8 C)  SpO2: 96%  Weight: 179 lb 6.4 oz (81.4 kg)  Height: 5\' 6"  (1.676 m)   Body mass index is 28.96 kg/m. Physical Exam  Vitals and nursing note reviewed.  Constitutional:      Appearance: Normal appearance.  HENT:     Head: Normocephalic and atraumatic.     Mouth/Throat:     Mouth: Mucous membranes are moist.  Eyes:     Extraocular Movements: Extraocular movements intact.     Conjunctiva/sclera: Conjunctivae normal.     Pupils: Pupils are equal, round, and reactive to light.  Cardiovascular:     Rate and Rhythm: Normal rate and regular rhythm.     Heart sounds: No murmur heard.   Pulmonary:     Breath sounds: No rales.  Abdominal:     General: Bowel sounds are normal.     Palpations: Abdomen is soft.     Tenderness: There is no abdominal tenderness. There is no left CVA tenderness or rebound.  Musculoskeletal:     Cervical back: Normal range of motion and neck supple.     Right lower leg: No edema.     Left lower leg: No edema.  Skin:    General: Skin is warm and dry.  Neurological:     General: No focal deficit present.     Mental Status: She is alert. Mental status is at baseline.     Gait: Gait abnormal.     Comments: Oriented to self. Ambulates with walker.   Psychiatric:     Comments: Confused. Smiled. Followed directions.      Labs reviewed: Recent Labs    05/08/19 0000 10/02/19 0000  11/25/19 0000  NA 143 141 140  K 3.7 4.1 3.8  CL 105 107 104  CO2 31 25* 29*  BUN 16 21 20   CREATININE 0.7 0.6 0.7  CALCIUM 9.5 8.9 9.4   Recent Labs    05/08/19 0000 10/02/19 0000 11/25/19 0000  AST 18 20 19   ALT 15 16 15   ALKPHOS 76 68 68  PROT 5.6*  --   --   ALBUMIN 3.7 3.6 3.6   Recent Labs    05/08/19 0000 10/02/19 0000 11/25/19 0000  WBC 6.8 7.2 8.1  NEUTROABS  --  3,586 4,163  HGB 13.6 14.1 13.8  HCT 41 42 42  PLT 235  --  224   Lab Results  Component Value Date   TSH 2.77 04/09/2018   Lab Results  Component Value Date   HGBA1C 5.3 04/26/2017   Lab Results  Component Value Date   CHOL 217 (A) 04/09/2018   HDL 51 04/09/2018   LDLCALC 147 04/09/2018   LDLDIRECT 173.0 08/07/2012   TRIG 86 04/09/2018   CHOLHDL 5 03/20/2016    Significant Diagnostic Results in last 30 days:  No results found.  Assessment/Plan Essential hypertension, benign Blood pressure is controlled, continue Atenolol.   Slow transit constipation Stable, continue Colace, MiraLax.   Late onset Alzheimer's disease without behavioral disturbance (HCC) Continue memory care unit for supportive care.   Depression, recurrent (HCC) Her mood is managed with Sertraline, prn Lorazepam.   Polymyalgia rheumatica (HCC) Stable, continue Prednisone.      Family/ staff Communication: plan of care reviewed with the patient and charge nurse.   Labs/tests ordered:  none  Time spend 35 minutes.

## 2020-04-27 NOTE — Assessment & Plan Note (Signed)
Blood pressure is controlled, continue Atenolol.  °

## 2020-04-27 NOTE — Assessment & Plan Note (Signed)
Stable, continue Colace, MiraLax.  

## 2020-04-27 NOTE — Assessment & Plan Note (Signed)
Continue memory care unit for supportive care.

## 2020-04-28 ENCOUNTER — Encounter: Payer: Self-pay | Admitting: Nurse Practitioner

## 2020-06-08 DIAGNOSIS — B351 Tinea unguium: Secondary | ICD-10-CM | POA: Diagnosis not present

## 2020-06-08 DIAGNOSIS — M79672 Pain in left foot: Secondary | ICD-10-CM | POA: Diagnosis not present

## 2020-06-08 DIAGNOSIS — M79671 Pain in right foot: Secondary | ICD-10-CM | POA: Diagnosis not present

## 2020-06-09 ENCOUNTER — Encounter: Payer: Self-pay | Admitting: Nurse Practitioner

## 2020-06-09 ENCOUNTER — Non-Acute Institutional Stay (SKILLED_NURSING_FACILITY): Payer: Medicare PPO | Admitting: Nurse Practitioner

## 2020-06-09 DIAGNOSIS — K5901 Slow transit constipation: Secondary | ICD-10-CM

## 2020-06-09 DIAGNOSIS — I10 Essential (primary) hypertension: Secondary | ICD-10-CM

## 2020-06-09 DIAGNOSIS — F339 Major depressive disorder, recurrent, unspecified: Secondary | ICD-10-CM

## 2020-06-09 DIAGNOSIS — G301 Alzheimer's disease with late onset: Secondary | ICD-10-CM

## 2020-06-09 DIAGNOSIS — F0280 Dementia in other diseases classified elsewhere without behavioral disturbance: Secondary | ICD-10-CM

## 2020-06-09 DIAGNOSIS — M353 Polymyalgia rheumatica: Secondary | ICD-10-CM | POA: Diagnosis not present

## 2020-06-09 NOTE — Assessment & Plan Note (Signed)
No behavioral issues, goal of care is comfort measures.

## 2020-06-09 NOTE — Assessment & Plan Note (Signed)
Stable, takes Colace, Miralax.

## 2020-06-09 NOTE — Assessment & Plan Note (Signed)
Her mood is stable, continue Sertraline.  

## 2020-06-09 NOTE — Progress Notes (Signed)
Location:   [facility] Room Number: 110 Place of Service:  SNF (31) Provider:  Shailah Gibbins, Arna Snipe NP  Emberlynn Riggan X, NP  Patient Care Team: Anav Lammert X, NP as PCP - General (Internal Medicine) Donnetta Hail, MD (Rheumatology) Ernesto Rutherford, MD (Ophthalmology) Nita Sells, MD (Dermatology) Crist Fat, MD as Attending Physician (Urology) Vara Mairena X, NP as Nurse Practitioner (Internal Medicine)  Extended Emergency Contact Information Primary Emergency Contact: McClanahan,Susan Address: [address] [place], [place] [address] [place] of [place] Home Phone: [phone] Mobile Phone: [phone] Relation: Daughter Secondary Emergency Contact: Gardiner Sleeper [place] Home Phone: [phone] Relation: Daughter  Code Status:  DNR Goals of care: Advanced Directive information Advanced Directives 06/09/2020  Does Patient Have a Medical Advance Directive? Yes  Type of Advance Directive Out of facility DNR (pink MOST or yellow form);Healthcare Power of Attorney  Does patient want to make changes to medical advance directive? No - Patient declined  Copy of Healthcare Power of Attorney in Chart? Yes - validated most recent copy scanned in chart (See row information)  Would patient like information on creating a medical advance directive? -  Pre-existing out of facility DNR order (yellow form or pink MOST form) Pink MOST form placed in chart (order not valid for inpatient use);Yellow form placed in chart (order not valid for inpatient use)     Chief Complaint  Patient presents with  . Medical Management of Chronic Issues    HPI:  Pt is a 86 y.o. female seen today for medical management of chronic diseases.    Depression/anxiety, takes Sertraline 125mg  qd, prn Lorazepam.               HTN, blood pressure is controlled, on Atenolol 25mg  qd.              PMR, stable, on Prednisone 3mg  qd. Prn Tylenol.               Constipation, takes Colace, Miralax.    Past Medical History:  Diagnosis Date  . Acute bronchitis 06/27/2015  . Alzheimer's dementia (HCC)   . Cataract   . Cellulitis of left leg   . Depression   . Eczema   . High cholesterol   . History of renal stone    excision  . Hypertension   . Mild cognitive impairment   . Osteoarthritis   . Polymyalgia rheumatica (HCC)    rx with prednisone  . Urinary incontinence   . Varicose veins   . Xerosis of skin    Past Surgical History:  Procedure Laterality Date  . cataract surgery    . EYE SURGERY    . JOINT REPLACEMENT Bilateral    Dr.  . kidney stone surgey    . lipoma removal      Allergies  Allergen Reactions  . Donepezil Other (See Comments)    BAD DREAMS  . Namenda [Memantine]   . Remeron [Mirtazapine] Other (See Comments)    Dizzy     Allergies as of 06/09/2020      Reactions   Donepezil Other (See Comments)   BAD DREAMS   Namenda [memantine]    Remeron [mirtazapine] Other (See Comments)   Dizzy       Medication List       Accurate as of June 09, 2020 11:59 PM. If you have any questions, ask your nurse or doctor.  acetaminophen 500 MG tablet Commonly known as: TYLENOL Take 1,000 mg by mouth every 8 (eight) hours as needed.   atenolol 25 MG tablet Commonly known as: TENORMIN Take 25 mg by mouth daily.   calcium carbonate 1500 (600 Ca) MG Tabs tablet Commonly known as: OSCAL Take 600 mg of elemental calcium by mouth 2 (two) times daily with a meal.   cholecalciferol 25 MCG (1000 UNIT) tablet Commonly known as: VITAMIN D Take 1,000 Units by mouth daily.   docusate sodium 100 MG capsule Commonly known as: COLACE Take 100 mg by mouth 2 (two) times daily.   Fish Oil 1200 MG Caps Take 1 capsule by mouth daily.   hydrocortisone cream 1 % Apply 1 application topically 2 (two) times daily as needed. Apply to the chest   NON FORMULARY Diet Regular Special Instructions: Give  prune juice daily with breakfast   OCUSOFT BABY EYELID & EYELASH EX Apply topically. 1 PAD BOTH EYES; CLEAN THE LIDS 2 TIMES A DAY   polyethylene glycol 17 g packet Commonly known as: MIRALAX / GLYCOLAX Take 17 g by mouth daily.   predniSONE 1 MG tablet Commonly known as: DELTASONE Take 3 mg by mouth daily.   sertraline 100 MG tablet Commonly known as: ZOLOFT Take 100 mg by mouth daily. Take with 25 mg = 125 mg.   sertraline 25 MG tablet Commonly known as: ZOLOFT Take 25 mg by mouth daily. 100 mg +25 mg= 125 mg   therapeutic multivitamin-minerals tablet Take 1 tablet by mouth daily. 400 mcg   THERATEARS OP Apply 1 drop to eye 4 (four) times daily. Both eyes       Review of Systems  Constitutional: Negative for activity change, fever and unexpected weight change.  HENT: Positive for hearing loss. Negative for congestion and voice change.   Eyes: Negative for visual disturbance.  Respiratory: Negative for cough and shortness of breath.   Cardiovascular: Negative for leg swelling.  Gastrointestinal: Negative for abdominal pain and constipation.  Genitourinary: Negative for dysuria, frequency and urgency.  Musculoskeletal: Positive for arthralgias, gait problem and myalgias.  Skin: Negative for color change.  Neurological: Negative for speech difficulty, weakness and headaches.       Dementia  Psychiatric/Behavioral: Positive for confusion. Negative for behavioral problems and sleep disturbance. The patient is not nervous/anxious.     Immunization History  Administered Date(s) Administered  . Influenza Whole 05/28/2012, 06/01/2018  . Influenza, High Dose Seasonal PF 06/08/2016  . Influenza-Unspecified 05/28/2014, 08/28/2014, 05/27/2015, 06/18/2017  . Moderna SARS-COVID-2 Vaccination 08/30/2019, 09/27/2019  . Pneumococcal Conjugate-13 01/21/2014  . Pneumococcal Polysaccharide-23 04/16/2015  . Td 09/29/2003  . Zoster 01/14/2011   Pertinent  Health Maintenance Due    Topic Date Due  . INFLUENZA VACCINE  03/28/2020  . DEXA SCAN  Completed  . PNA vac Low Risk Adult  Completed   Fall Risk  05/17/2018 05/10/2017 05/01/2017 07/17/2016 06/09/2015  Falls in the past year? No No No Yes No  Comment - - - tripped over the last stair in the house -  Number falls in past yr: - - - 1 -   Functional Status Survey:    Vitals:   06/09/20 0942  BP: (!) 150/80  Pulse: 66  Resp: 20  Temp: 98.3 F (36.8 C)  SpO2: 95%  Weight: 179 lb (81.2 kg)  Height: 5\' 6"  (1.676 m)   Body mass index is 28.89 kg/m. Physical Exam Vitals and nursing note reviewed.  Constitutional:  Appearance: Normal appearance.  HENT:     Head: Normocephalic and atraumatic.  Eyes:     Extraocular Movements: Extraocular movements intact.     Conjunctiva/sclera: Conjunctivae normal.     Pupils: Pupils are equal, round, and reactive to light.  Cardiovascular:     Rate and Rhythm: Normal rate and regular rhythm.     Heart sounds: No murmur heard.   Pulmonary:     Breath sounds: No rales.  Abdominal:     General: Bowel sounds are normal.     Palpations: Abdomen is soft.     Tenderness: There is no abdominal tenderness.  Musculoskeletal:     Cervical back: Normal range of motion and neck supple.     Right lower leg: No edema.     Left lower leg: No edema.  Skin:    General: Skin is warm and dry.     Comments: Left forearm skin abrasion is scabbed over.   Neurological:     General: No focal deficit present.     Mental Status: She is alert. Mental status is at baseline.     Gait: Gait abnormal.     Comments: Oriented to self. Ambulates with walker.   Psychiatric:     Comments: Confused. Smiled. Followed directions.      Labs reviewed: Recent Labs    10/02/19 0000 11/25/19 0000  NA 141 140  K 4.1 3.8  CL 107 104  CO2 25* 29*  BUN 21 20  CREATININE 0.6 0.7  CALCIUM 8.9 9.4   Recent Labs    10/02/19 0000 11/25/19 0000  AST 20 19  ALT 16 15  ALKPHOS 68 68   ALBUMIN 3.6 3.6   Recent Labs    10/02/19 0000 11/25/19 0000  WBC 7.2 8.1  NEUTROABS 3,586 4,163  HGB 14.1 13.8  HCT 42 42  PLT  --  224   Lab Results  Component Value Date   TSH 2.77 04/09/2018   Lab Results  Component Value Date   HGBA1C 5.3 04/26/2017   Lab Results  Component Value Date   CHOL 217 (A) 04/09/2018   HDL 51 04/09/2018   LDLCALC 147 04/09/2018   LDLDIRECT 173.0 08/07/2012   TRIG 86 04/09/2018   CHOLHDL 5 03/20/2016    Significant Diagnostic Results in last 30 days:  No results found.  Assessment/Plan Essential hypertension, benign blood pressure is controlled, on Atenolol 25mg  qd.    Slow transit constipation Stable, takes Colace, Miralax.    Late onset Alzheimer's disease without behavioral disturbance (HCC) No behavioral issues, goal of care is comfort measures.   Depression, recurrent (HCC) Her mood is stable, continue Sertraline.   Polymyalgia rheumatica (HCC) stable, on Prednisone 3mg  qd. Prn Tylenol.      Family/ staff Communication: plan of care reviewed with the patient and charge nurse.   Labs/tests ordered:  none  Time spend 35 minutes.

## 2020-06-09 NOTE — Assessment & Plan Note (Signed)
blood pressure is controlled, on Atenolol 25mg qd.  

## 2020-06-09 NOTE — Assessment & Plan Note (Signed)
stable, on Prednisone 3mg qd.Prn Tylenol.  

## 2020-06-10 ENCOUNTER — Encounter: Payer: Self-pay | Admitting: Nurse Practitioner

## 2020-06-25 ENCOUNTER — Encounter: Payer: Self-pay | Admitting: Nurse Practitioner

## 2020-06-25 ENCOUNTER — Non-Acute Institutional Stay (SKILLED_NURSING_FACILITY): Payer: Medicare PPO | Admitting: Nurse Practitioner

## 2020-06-25 DIAGNOSIS — G301 Alzheimer's disease with late onset: Secondary | ICD-10-CM

## 2020-06-25 DIAGNOSIS — M353 Polymyalgia rheumatica: Secondary | ICD-10-CM

## 2020-06-25 DIAGNOSIS — I10 Essential (primary) hypertension: Secondary | ICD-10-CM

## 2020-06-25 DIAGNOSIS — F0280 Dementia in other diseases classified elsewhere without behavioral disturbance: Secondary | ICD-10-CM | POA: Diagnosis not present

## 2020-06-25 DIAGNOSIS — F339 Major depressive disorder, recurrent, unspecified: Secondary | ICD-10-CM

## 2020-06-25 DIAGNOSIS — K5901 Slow transit constipation: Secondary | ICD-10-CM

## 2020-06-25 NOTE — Progress Notes (Signed)
Location:    [facility] Room Number: 110 Place of Service:  SNF (31) Provider: Marlana Latus NP  ,  X, NP  Patient Care Team: ,  X, NP as PCP - General (Internal Medicine) Hennie Duos, MD (Rheumatology) Clent Jacks, MD (Ophthalmology) Allyn Kenner, MD (Dermatology) Ardis Hughs, MD as Attending Physician (Urology) ,  X, NP as Nurse Practitioner (Internal Medicine)  Extended Emergency Contact Information Primary Emergency Contact: McClanahan,Susan Address: [place], [place] [address] Johnnette Litter of [place] Phone: [phone] Mobile Phone: [phone] Relation: Daughter Secondary Emergency Contact: Newman Nip [place] of [place] Phone: [phone] Relation: Daughter  Code Status: DNR Goals of care: Advanced Directive information Advanced Directives 06/09/2020  Does Patient Have a Medical Advance Directive? Yes  Type of Advance Directive Out of facility DNR (pink MOST or yellow form);Healthcare Power of Attorney  Does patient want to make changes to medical advance directive? No - Patient declined  Copy of [place] in Chart? Yes - validated most recent copy scanned in chart (See row information)  Would patient like information on creating a medical advance directive? -  Pre-existing out of facility DNR order (yellow form or pink MOST form) Pink MOST form placed in chart (order not valid for inpatient use);Yellow form placed in chart (order not valid for inpatient use)     Chief Complaint  Patient presents with  . Acute Visit    Reported not voiding, sleepy 06/24/20    HPI:  Pt is a 86 y.o. female seen today for an acute visit for reported not voiding, sleepy 06/24/20, staff reported today 06/25/20 the patient is in her usual state of health, ate meals, urinated a couple of times. She is afebrile, no noted dysuria, cough, SOB, nausea,  vomiting, constipation, or diarrhea.    Depression/anxiety, takesSertraline 110m qd, prn Lorazepam.  HTN, blood pressure is controlled, on Atenolol 2779mqd.  PMR, stable, on Prednisone 79m479md. Prn Tylenol.  Constipation, takes Colace, Miralax.      Past Medical History:  Diagnosis Date  . Acute bronchitis 06/27/2015  . Alzheimer's dementia ([facility] . Cataract   . Cellulitis of left leg   . Depression   . Eczema   . High cholesterol   . History of renal stone    excision  . Hypertension   . Mild cognitive impairment   . Osteoarthritis   . Polymyalgia rheumatica (HCC)    rx with prednisone  . Urinary incontinence   . Varicose veins   . Xerosis of skin    Past Surgical History:  Procedure Laterality Date  . cataract surgery    . EYE SURGERY    . JOINT REPLACEMENT Bilateral    Dr. FraHector Shade kidney stone surgey    . lipoma removal      Allergies  Allergen Reactions  . Donepezil Other (See Comments)    BAD DREAMS  . Namenda [Memantine]   . Remeron [Mirtazapine] Other (See Comments)    Dizzy     Allergies as of 06/25/2020      Reactions   Donepezil Other (See Comments)   BAD DREAMS   Namenda [memantine]    Remeron [mirtazapine] Other (See Comments)   Dizzy       Medication List       Accurate as of June 25, 2020 11:59 PM. If you have any questions,  ask your nurse or doctor.        acetaminophen 500 MG tablet Commonly known as: TYLENOL Take 1,000 mg by mouth every 8 (eight) hours as needed.   atenolol 25 MG tablet Commonly known as: TENORMIN Take 25 mg by mouth daily.   calcium carbonate 1500 (600 Ca) MG Tabs tablet Commonly known as: OSCAL Take 600 mg of elemental calcium by mouth 2 (two) times daily with a meal.   cholecalciferol 25 MCG (1000 UNIT) tablet Commonly known as: VITAMIN D Take 1,000 Units by mouth daily.   docusate sodium 100 MG capsule Commonly known as: COLACE Take 100 mg by  mouth 2 (two) times daily.   Fish Oil 1200 MG Caps Take 1 capsule by mouth daily.   hydrocortisone cream 1 % Apply 1 application topically 2 (two) times daily as needed. Apply to the chest   NON FORMULARY Diet Regular Special Instructions: Give prune juice daily with breakfast   OCUSOFT BABY EYELID & EYELASH EX Apply topically. 1 PAD BOTH EYES; CLEAN THE LIDS 2 TIMES A DAY   polyethylene glycol 17 g packet Commonly known as: MIRALAX / GLYCOLAX Take 17 g by mouth daily.   predniSONE 1 MG tablet Commonly known as: DELTASONE Take 3 mg by mouth daily.   sertraline 100 MG tablet Commonly known as: ZOLOFT Take 100 mg by mouth daily. Take with 25 mg = 125 mg.   sertraline 25 MG tablet Commonly known as: ZOLOFT Take 25 mg by mouth daily. 100 mg +25 mg= 125 mg   therapeutic multivitamin-minerals tablet Take 1 tablet by mouth daily. 400 mcg   THERATEARS OP Apply 1 drop to eye 4 (four) times daily. Both eyes       Review of Systems  Constitutional: Positive for activity change and appetite change. Negative for fever.  HENT: Positive for hearing loss. Negative for congestion and voice change.   Eyes: Negative for visual disturbance.  Respiratory: Negative for cough and shortness of breath.   Cardiovascular: Negative for leg swelling.  Gastrointestinal: Negative for abdominal pain and constipation.  Genitourinary: Negative for dysuria, frequency and urgency.  Musculoskeletal: Positive for arthralgias, gait problem and myalgias.  Skin: Negative for color change.  Neurological: Negative for speech difficulty, weakness and headaches.       Dementia  Psychiatric/Behavioral: Positive for confusion. Negative for behavioral problems and sleep disturbance. The patient is not nervous/anxious.     Immunization History  Administered Date(s) Administered  . Influenza Whole 05/28/2012, 06/01/2018  . Influenza, High Dose Seasonal PF 06/08/2016  . Influenza-Unspecified 05/28/2014,  08/28/2014, 05/27/2015, 06/18/2017  . Moderna SARS-COVID-2 Vaccination 08/30/2019, 09/27/2019  . Pneumococcal Conjugate-13 01/21/2014  . Pneumococcal Polysaccharide-23 04/16/2015  . Td 09/29/2003  . Zoster 01/14/2011   Pertinent  Health Maintenance Due  Topic Date Due  . INFLUENZA VACCINE  03/28/2020  . DEXA SCAN  Completed  . PNA vac Low Risk Adult  Completed   Fall Risk  05/17/2018 05/10/2017 05/01/2017 07/17/2016 06/09/2015  Falls in the past year? No No No Yes No  Comment - - - tripped over the last stair in the house -  Number falls in past yr: - - - 1 -   Functional Status Survey:    Vitals:   06/25/20 1352  BP: 136/70  Pulse: (!) 58  Resp: 16  Temp: (!) 97 F (36.1 C)  SpO2: 94%  Weight: 179 lb (81.2 kg)  Height: 5' 6" (1.676 m)   Body mass index is 28.89 kg/m.  Physical Exam Vitals and nursing note reviewed.  Constitutional:      Appearance: Normal appearance.  HENT:     Head: Normocephalic and atraumatic.     Mouth/Throat:     Mouth: Mucous membranes are dry.  Eyes:     Extraocular Movements: Extraocular movements intact.     Conjunctiva/sclera: Conjunctivae normal.     Pupils: Pupils are equal, round, and reactive to light.  Cardiovascular:     Rate and Rhythm: Normal rate and regular rhythm.     Heart sounds: No murmur heard.   Pulmonary:     Breath sounds: No rales.  Abdominal:     General: Bowel sounds are normal.     Palpations: Abdomen is soft.     Tenderness: There is no abdominal tenderness.  Musculoskeletal:     Cervical back: Normal range of motion and neck supple.     Right lower leg: No edema.     Left lower leg: No edema.  Skin:    General: Skin is warm and dry.     Comments: Left forearm skin abrasion is scabbed over.   Neurological:     General: No focal deficit present.     Mental Status: She is alert. Mental status is at baseline.     Gait: Gait abnormal.     Comments: Oriented to self. Ambulates with walker.   Psychiatric:      Comments: Confused. Smiled. Followed directions.      Labs reviewed: Recent Labs    10/02/19 0000 11/25/19 0000  NA 141 140  K 4.1 3.8  CL 107 104  CO2 25* 29*  BUN 21 20  CREATININE 0.6 0.7  CALCIUM 8.9 9.4   Recent Labs    10/02/19 0000 11/25/19 0000  AST 20 19  ALT 16 15  ALKPHOS 68 68  ALBUMIN 3.6 3.6   Recent Labs    10/02/19 0000 11/25/19 0000  WBC 7.2 8.1  NEUTROABS 3,586 4,163  HGB 14.1 13.8  HCT 42 42  PLT  --  224   Lab Results  Component Value Date   TSH 2.77 04/09/2018   Lab Results  Component Value Date   HGBA1C 5.3 04/26/2017   Lab Results  Component Value Date   CHOL 217 (A) 04/09/2018   HDL 51 04/09/2018   LDLCALC 147 04/09/2018   LDLDIRECT 173.0 08/07/2012   TRIG 86 04/09/2018   CHOLHDL 5 03/20/2016    Significant Diagnostic Results in last 30 days:  No results found.  Assessment/Plan: Late onset Alzheimer's disease without behavioral disturbance ([place]) not voiding, sleepy 06/24/20, staff reported today 06/25/20 the patient is in her usual state of health, ate meals, urinated a couple of times. She is afebrile, no noted dysuria, cough, SOB, nausea, vomiting, constipation, or diarrhea.   Encourage oral fluid intakes, I+O x 72hours, CBC/diff, CMP/eGFR   Depression, recurrent (HCC) Depression/anxiety, takesSertraline 130m qd, prn Lorazepam.   Essential hypertension, benign HTN, blood pressure is controlled, on Atenolol 299mqd.    Polymyalgia rheumatica (HCC) PMR, stable, on Prednisone 18m78md. Prn Tylenol.  Slow transit constipation Constipation, takes Colace, Miralax.    Family/ staff Communication: plan of care reviewed with the patient and charge nurse.   Labs/tests ordered:  CBC/diff, CMP/eGFR  Time spend 35 minutes.

## 2020-06-25 NOTE — Assessment & Plan Note (Signed)
Constipation, takes Colace, Miralax. 

## 2020-06-25 NOTE — Assessment & Plan Note (Signed)
PMR, stable, on Prednisone 3mg qd.Prn Tylenol 

## 2020-06-25 NOTE — Assessment & Plan Note (Signed)
Depression/anxiety, takesSertraline 125mg  qd, prn Lorazepam.

## 2020-06-25 NOTE — Assessment & Plan Note (Signed)
not voiding, sleepy 06/24/20, staff reported today 06/25/20 the patient is in her usual state of health, ate meals, urinated a couple of times. She is afebrile, no noted dysuria, cough, SOB, nausea, vomiting, constipation, or diarrhea.   Encourage oral fluid intakes, I+O x 72hours, CBC/diff, CMP/eGFR

## 2020-06-25 NOTE — Assessment & Plan Note (Signed)
HTN, blood pressure is controlled, on Atenolol 25mg qd.  

## 2020-06-28 ENCOUNTER — Encounter: Payer: Self-pay | Admitting: Nurse Practitioner

## 2020-06-29 DIAGNOSIS — I10 Essential (primary) hypertension: Secondary | ICD-10-CM | POA: Diagnosis not present

## 2020-06-30 LAB — COMPREHENSIVE METABOLIC PANEL
Albumin: 3.7 (ref 3.5–5.0)
Albumin: 4.2 (ref 3.5–5.0)
Calcium: 9.5 (ref 8.7–10.7)
Calcium: 9.7 (ref 8.7–10.7)
Globulin: 2
Globulin: 2.3

## 2020-06-30 LAB — CBC AND DIFFERENTIAL
HCT: 42 (ref 36–46)
HCT: 46 (ref 36–46)
Hemoglobin: 14.1 (ref 12.0–16.0)
Hemoglobin: 15.1 (ref 12.0–16.0)
Neutrophils Absolute: 3838
Neutrophils Absolute: 4662
Platelets: 228 (ref 150–399)
Platelets: 275 (ref 150–399)
WBC: 7.6
WBC: 8.4

## 2020-06-30 LAB — BASIC METABOLIC PANEL
BUN: 22 — AB (ref 4–21)
BUN: 22 — AB (ref 4–21)
CO2: 26 — AB (ref 13–22)
CO2: 30 — AB (ref 13–22)
Chloride: 102 (ref 99–108)
Chloride: 105 (ref 99–108)
Creatinine: 0.7 (ref 0.5–1.1)
Creatinine: 0.7 (ref 0.5–1.1)
Glucose: 120
Glucose: 89
Potassium: 3.3 — AB (ref 3.4–5.3)
Potassium: 3.7 (ref 3.4–5.3)
Sodium: 141 (ref 137–147)
Sodium: 142 (ref 137–147)

## 2020-06-30 LAB — TSH: TSH: 3.24 (ref 0.41–5.90)

## 2020-06-30 LAB — HEPATIC FUNCTION PANEL
ALT: 16 (ref 7–35)
ALT: 18 (ref 7–35)
AST: 18 (ref 13–35)
AST: 18 (ref 13–35)
Alkaline Phosphatase: 70 (ref 25–125)
Alkaline Phosphatase: 82 (ref 25–125)
Bilirubin, Total: 0.5
Bilirubin, Total: 0.5

## 2020-06-30 LAB — CBC
RBC: 4.85 (ref 3.87–5.11)
RBC: 5.16 — AB (ref 3.87–5.11)

## 2020-07-05 ENCOUNTER — Non-Acute Institutional Stay (SKILLED_NURSING_FACILITY): Payer: Medicare PPO | Admitting: Nurse Practitioner

## 2020-07-05 DIAGNOSIS — I499 Cardiac arrhythmia, unspecified: Secondary | ICD-10-CM

## 2020-07-05 DIAGNOSIS — F339 Major depressive disorder, recurrent, unspecified: Secondary | ICD-10-CM

## 2020-07-05 DIAGNOSIS — I10 Essential (primary) hypertension: Secondary | ICD-10-CM

## 2020-07-05 DIAGNOSIS — K5901 Slow transit constipation: Secondary | ICD-10-CM | POA: Diagnosis not present

## 2020-07-05 DIAGNOSIS — M353 Polymyalgia rheumatica: Secondary | ICD-10-CM

## 2020-07-05 NOTE — Progress Notes (Signed)
Location:   [facility] Room Number: 110 Place of Service:  SNF (31) Provider: Marlana Latus NP  Mateya Torti X, NP  Patient Care Team: Laketra Bowdish X, NP as PCP - General (Internal Medicine) Hennie Duos, MD (Rheumatology) Clent Jacks, MD (Ophthalmology) Allyn Kenner, MD (Dermatology) Ardis Hughs, MD as Attending Physician (Urology) Johonna Binette X, NP as Nurse Practitioner (Internal Medicine)  Extended Emergency Contact Information Primary Emergency Contact: McClanahan,Susan Address: [place], [place] [address] Johnnette Litter of [place] Phone: [phone] Mobile Phone: [phone] Relation: Daughter Secondary Emergency Contact: Newman Nip [place] of [place] Phone: [phone] Relation: Daughter  Code Status: DNR Goals of care: Advanced Directive information Advanced Directives 06/09/2020  Does Patient Have a Medical Advance Directive? Yes  Type of Advance Directive Out of facility DNR (pink MOST or yellow form);Healthcare Power of Attorney  Does patient want to make changes to medical advance directive? No - Patient declined  Copy of [place] in Chart? Yes - validated most recent copy scanned in chart (See row information)  Would patient like information on creating a medical advance directive? -  Pre-existing out of facility DNR order (yellow form or pink MOST form) Pink MOST form placed in chart (order not valid for inpatient use);Yellow form placed in chart (order not valid for inpatient use)     Chief Complaint  Patient presents with   Acute Visit    Agitation    HPI:  Pt is a 86 y.o. female seen today for an acute visit for reported the patient's agitation, combative yelling, cursing, going into other residents' rooms 07/03/20, Lorazepam 0.58m x1 was effective.   Depression/anxiety, takesSertraline 1286mqd, prn Lorazepam.  HTN, blood pressure is  controlled, on Atenolol 2559md.  PMR, stable, on Prednisone 3mg26m.Prn Tylenol. Constipation, takes Colace, Miralax.    Past Medical History:  Diagnosis Date   Acute bronchitis 06/27/2015   Alzheimer's dementia (HCC)[place] Cataract    Cellulitis of left leg    Depression    Eczema    High cholesterol    History of renal stone    excision   Hypertension    Mild cognitive impairment    Osteoarthritis    Polymyalgia rheumatica (HCC)    rx with prednisone   Urinary incontinence    Varicose veins    Xerosis of skin    Past Surgical History:  Procedure Laterality Date   cataract surgery     EYE SURGERY     JOINT REPLACEMENT Bilateral    Dr. FranHector Shadeidney stone surgey     lipoma removal      Allergies  Allergen Reactions   Donepezil Other (See Comments)    BAD DREAMS   Namenda [Memantine]    Remeron [Mirtazapine] Other (See Comments)    Dizzy     Allergies as of 07/05/2020      Reactions   Donepezil Other (See Comments)   BAD DREAMS   Namenda [memantine]    Remeron [mirtazapine] Other (See Comments)   Dizzy       Medication List       Accurate as of July 05, 2020 11:59 PM. If you have any questions, ask your nurse or doctor.        acetaminophen 500 MG tablet Commonly known as: TYLENOL Take 1,000 mg by mouth every 8 (eight) hours as  needed.   atenolol 25 MG tablet Commonly known as: TENORMIN Take 25 mg by mouth daily.   calcium carbonate 1500 (600 Ca) MG Tabs tablet Commonly known as: OSCAL Take 600 mg of elemental calcium by mouth 2 (two) times daily with a meal.   cholecalciferol 25 MCG (1000 UNIT) tablet Commonly known as: VITAMIN D Take 1,000 Units by mouth daily.   docusate sodium 100 MG capsule Commonly known as: COLACE Take 100 mg by mouth 2 (two) times daily.   Fish Oil 1200 MG Caps Take 1 capsule by mouth daily.   hydrocortisone cream 1 % Apply 1 application topically 2  (two) times daily as needed. Apply to the chest   LORazepam 0.5 MG tablet Commonly known as: ATIVAN Take 0.25 mg by mouth every 6 (six) hours as needed for anxiety.   NON FORMULARY Diet Regular Special Instructions: Give prune juice daily with breakfast   OCUSOFT BABY EYELID & EYELASH EX Apply topically. 1 PAD BOTH EYES; CLEAN THE LIDS 2 TIMES A DAY   polyethylene glycol 17 g packet Commonly known as: MIRALAX / GLYCOLAX Take 17 g by mouth daily.   predniSONE 1 MG tablet Commonly known as: DELTASONE Take 3 mg by mouth daily.   sertraline 100 MG tablet Commonly known as: ZOLOFT Take 100 mg by mouth daily. Take with 25 mg = 125 mg.   sertraline 25 MG tablet Commonly known as: ZOLOFT Take 25 mg by mouth daily. 100 mg +25 mg= 125 mg   therapeutic multivitamin-minerals tablet Take 1 tablet by mouth daily. 400 mcg   THERATEARS OP Apply 1 drop to eye 4 (four) times daily. Both eyes       Review of Systems  Constitutional: Negative for activity change, appetite change and fever.  HENT: Positive for hearing loss. Negative for congestion and voice change.   Eyes: Negative for visual disturbance.  Respiratory: Negative for cough and shortness of breath.   Cardiovascular: Negative for leg swelling.  Gastrointestinal: Negative for abdominal pain and constipation.  Genitourinary: Negative for dysuria, frequency and urgency.  Musculoskeletal: Positive for arthralgias, gait problem and myalgias.  Skin: Negative for color change.  Neurological: Negative for speech difficulty, weakness and headaches.       Dementia  Psychiatric/Behavioral: Positive for agitation, behavioral problems and confusion. Negative for sleep disturbance. The patient is nervous/anxious.     Immunization History  Administered Date(s) Administered   Influenza Whole 05/28/2012, 06/01/2018   Influenza, High Dose Seasonal PF 06/08/2016   Influenza-Unspecified 05/28/2014, 08/28/2014, 05/27/2015, 06/18/2017,  06/10/2020   Moderna SARS-COVID-2 Vaccination 08/30/2019, 09/27/2019   Pneumococcal Conjugate-13 01/21/2014   Pneumococcal Polysaccharide-23 04/16/2015   Td 09/29/2003   Zoster 01/14/2011   Pertinent  Health Maintenance Due  Topic Date Due   INFLUENZA VACCINE  Completed   DEXA SCAN  Completed   PNA vac Low Risk Adult  Completed   Fall Risk  05/17/2018 05/10/2017 05/01/2017 07/17/2016 06/09/2015  Falls in the past year? No No No Yes No  Comment - - - tripped over the last stair in the house -  Number falls in past yr: - - - 1 -   Functional Status Survey:    Vitals:   07/07/20 1020  BP: 138/88  Pulse: 74  Resp: (!) 22  Temp: 98.4 F (36.9 C)  SpO2: 99%  Weight: 179 lb 6.4 oz (81.4 kg)  Height: _0  (1.676 m)   Body mass index is 28.96 kg/m. Physical Exam Vitals and nursing note reviewed.  Constitutional:      Appearance: Normal appearance.  HENT:     Head: Normocephalic and atraumatic.     Mouth/Throat:     Mouth: Mucous membranes are dry.  Eyes:     Extraocular Movements: Extraocular movements intact.     Conjunctiva/sclera: Conjunctivae normal.     Pupils: Pupils are equal, round, and reactive to light.  Cardiovascular:     Rate and Rhythm: Normal rate. Rhythm irregular.     Heart sounds: No murmur heard.   Pulmonary:     Breath sounds: No rales.  Abdominal:     General: Bowel sounds are normal.     Palpations: Abdomen is soft.     Tenderness: There is no abdominal tenderness.  Musculoskeletal:     Cervical back: Normal range of motion and neck supple.     Right lower leg: No edema.     Left lower leg: No edema.  Skin:    General: Skin is warm and dry.     Comments: Left forearm skin abrasion is scabbed over.   Neurological:     General: No focal deficit present.     Mental Status: She is alert. Mental status is at baseline.     Gait: Gait abnormal.     Comments: Oriented to self. Ambulates with walker.   Psychiatric:     Comments: Confused.  Smiled. Followed directions.      Labs reviewed: Recent Labs    10/02/19 0000 11/25/19 0000  NA 141 140  K 4.1 3.8  CL 107 104  CO2 25* 29*  BUN 21 20  CREATININE 0.6 0.7  CALCIUM 8.9 9.4   Recent Labs    10/02/19 0000 11/25/19 0000  AST 20 19  ALT 16 15  ALKPHOS 68 68  ALBUMIN 3.6 3.6   Recent Labs    10/02/19 0000 11/25/19 0000  WBC 7.2 8.1  NEUTROABS 3,586 4,163  HGB 14.1 13.8  HCT 42 42  PLT  --  224   Lab Results  Component Value Date   TSH 2.77 04/09/2018   Lab Results  Component Value Date   HGBA1C 5.3 04/26/2017   Lab Results  Component Value Date   CHOL 217 (A) 04/09/2018   HDL 51 04/09/2018   LDLCALC 147 04/09/2018   LDLDIRECT 173.0 08/07/2012   TRIG 86 04/09/2018   CHOLHDL 5 03/20/2016    Significant Diagnostic Results in last 30 days:  No results found.  Assessment/Plan: Depression, recurrent ([place])  Reported the patient's agitation, combative yelling, cursing, going into other residents' rooms 07/03/20, Lorazepam 0.61m x1 was effective.   Depression/anxiety, takesSertraline 1233mqd, resume prn Lorazepam x 14 days.   Will update CBC/diff, CMP/eGFR, TSH    Essential hypertension, benign HTN, blood pressure is controlled, on Atenolol 2562md.   Polymyalgia rheumatica (HCC) PMR, stable, on Prednisone 3mg64m.Prn Tylenol.   Slow transit constipation Constipation, takes Colace, Miralax.  Irregular heart beats Heart rate is w/i control, no Hx of Afib, will obtain EKG to evaluate further.     Family/ staff Communication: plan of care reviewed with the patient and charge nurse.   Labs/tests ordered: CBC/diff, CMP/eGFR, TSH, EKG  Time spend 35 minutes.

## 2020-07-05 NOTE — Assessment & Plan Note (Signed)
Heart rate is w/i control, no Hx of Afib, will obtain EKG to evaluate further.

## 2020-07-05 NOTE — Assessment & Plan Note (Signed)
Reported the patient's agitation, combative yelling, cursing, going into other residents' rooms 07/03/20, Lorazepam 0.43m x1 was effective.   Depression/anxiety, takesSertraline 1228mqd, resume prn Lorazepam x 14 days.   Will update CBC/diff, CMP/eGFR, TSH

## 2020-07-05 NOTE — Assessment & Plan Note (Signed)
PMR, stable, on Prednisone 3mg qd.Prn Tylenol 

## 2020-07-05 NOTE — Assessment & Plan Note (Signed)
Constipation, takes Colace, Miralax. 

## 2020-07-05 NOTE — Assessment & Plan Note (Signed)
HTN, blood pressure is controlled, on Atenolol 25mg qd.  

## 2020-07-06 ENCOUNTER — Encounter: Payer: Self-pay | Admitting: Nurse Practitioner

## 2020-07-06 DIAGNOSIS — I10 Essential (primary) hypertension: Secondary | ICD-10-CM | POA: Diagnosis not present

## 2020-07-07 ENCOUNTER — Encounter: Payer: Self-pay | Admitting: Nurse Practitioner

## 2020-07-07 LAB — COMPREHENSIVE METABOLIC PANEL
Albumin: 4.2 (ref 3.5–5.0)
Calcium: 9.7 (ref 8.7–10.7)
Globulin: 2.3

## 2020-07-07 LAB — HEPATIC FUNCTION PANEL
ALT: 18 (ref 7–35)
AST: 18 (ref 13–35)
Alkaline Phosphatase: 82 (ref 25–125)
Bilirubin, Total: 0.5

## 2020-07-07 LAB — BASIC METABOLIC PANEL
BUN: 22 — AB (ref 4–21)
CO2: 30 — AB (ref 13–22)
Chloride: 102 (ref 99–108)
Creatinine: 0.7 (ref 0.5–1.1)
Glucose: 120
Potassium: 3.3 — AB (ref 3.4–5.3)
Sodium: 141 (ref 137–147)

## 2020-07-07 LAB — CBC AND DIFFERENTIAL
HCT: 46 (ref 36–46)
Hemoglobin: 15.1 (ref 12.0–16.0)
Neutrophils Absolute: 3838
Platelets: 275 (ref 150–399)
WBC: 7.6

## 2020-07-07 LAB — CBC: RBC: 5.16 — AB (ref 3.87–5.11)

## 2020-07-07 LAB — TSH: TSH: 3.24 (ref 0.41–5.90)

## 2020-07-19 ENCOUNTER — Encounter: Payer: Self-pay | Admitting: Internal Medicine

## 2020-07-19 ENCOUNTER — Non-Acute Institutional Stay (SKILLED_NURSING_FACILITY): Payer: Medicare PPO | Admitting: Internal Medicine

## 2020-07-19 DIAGNOSIS — G301 Alzheimer's disease with late onset: Secondary | ICD-10-CM

## 2020-07-19 DIAGNOSIS — I10 Essential (primary) hypertension: Secondary | ICD-10-CM

## 2020-07-19 DIAGNOSIS — M353 Polymyalgia rheumatica: Secondary | ICD-10-CM

## 2020-07-19 DIAGNOSIS — F339 Major depressive disorder, recurrent, unspecified: Secondary | ICD-10-CM

## 2020-07-19 DIAGNOSIS — F0280 Dementia in other diseases classified elsewhere without behavioral disturbance: Secondary | ICD-10-CM

## 2020-07-19 NOTE — Progress Notes (Signed)
Location:  [facility] Room Number: 110 Place of Service:  SNF (31)  Provider:Sandrea Boer MD   Code Status: DNR Goals of Care:  Advanced Directives 06/09/2020  Does Patient Have a Medical Advance Directive? Yes  Type of Advance Directive Out of facility DNR (pink MOST or yellow form);Healthcare Power of Attorney  Does patient want to make changes to medical advance directive? No - Patient declined  Copy of Healthcare Power of Attorney in Chart? Yes - validated most recent copy scanned in chart (See row information)  Would patient like information on creating a medical advance directive? -  Pre-existing out of facility DNR order (yellow form or pink MOST form) Pink MOST form placed in chart (order not valid for inpatient use);Yellow form placed in chart (order not valid for inpatient use)     Chief Complaint  Patient presents with  . Medical Management of Chronic Issues    HPI: Patient is a 86 y.o. female seen today for medical management of chronic diseases.    Patient has h/o Frontal temporal Dementia with aphasia, Hypertension,  PMR on Chronic Prednisone,  Anxiety with Depression Now is in Memory unit due to her behavior issues including Elopement from the facility.  Doing well.  Behavior is better. Walks with no assists Has Aphasia Weight stable No New Nursing issues  Past Medical History:  Diagnosis Date  . Acute bronchitis 06/27/2015  . Alzheimer's dementia (HCC)   . Cataract   . Cellulitis of left leg   . Depression   . Eczema   . High cholesterol   . History of renal stone    excision  . Hypertension   . Mild cognitive impairment   . Osteoarthritis   . Polymyalgia rheumatica (HCC)    rx with prednisone  . Urinary incontinence   . Varicose veins   . Xerosis of skin     Past Surgical History:  Procedure Laterality Date  . cataract surgery    . EYE SURGERY    . JOINT REPLACEMENT Bilateral    Dr. Trudee Grip  . kidney  stone surgey    . lipoma removal      Allergies  Allergen Reactions  . Donepezil Other (See Comments)    BAD DREAMS  . Namenda [Memantine]   . Remeron [Mirtazapine] Other (See Comments)    Dizzy     Outpatient Encounter Medications as of 07/19/2020  Medication Sig  . acetaminophen (TYLENOL) 500 MG tablet Take 1,000 mg by mouth every 8 (eight) hours as needed.   Marland Kitchen atenolol (TENORMIN) 25 MG tablet Take 25 mg by mouth daily.  . calcium carbonate (OSCAL) 1500 (600 Ca) MG TABS tablet Take 600 mg of elemental calcium by mouth 2 (two) times daily with a meal.  . Carboxymethylcellulose Sodium (THERATEARS OP) Apply 1 drop to eye 4 (four) times daily. Both eyes  . cholecalciferol (VITAMIN D) 1000 units tablet Take 1,000 Units by mouth daily.  Marland Kitchen docusate sodium (COLACE) 100 MG capsule Take 100 mg by mouth 2 (two) times daily.  . Eyelid Cleansers (OCUSOFT BABY EYELID & EYELASH EX) Apply topically. 1 PAD BOTH EYES; CLEAN THE LIDS 2 TIMES A DAY  . hydrocortisone cream 1 % Apply 1 application topically 2 (two) times daily as needed. Apply to the chest   . NON FORMULARY Diet Regular Special Instructions: Give prune juice daily with breakfast  . Omega-3 Fatty Acids (FISH OIL) 1200 MG CAPS Take 1 capsule by mouth daily.   Marland Kitchen  polyethylene glycol (MIRALAX / GLYCOLAX) 17 g packet Take 17 g by mouth daily.  . predniSONE (DELTASONE) 1 MG tablet Take 3 mg by mouth daily.   . sertraline (ZOLOFT) 100 MG tablet Take 100 mg by mouth daily. Take with 25 mg = 125 mg.  . sertraline (ZOLOFT) 25 MG tablet Take 25 mg by mouth daily. 100 mg +25 mg= 125 mg  . therapeutic multivitamin-minerals (THERAGRAN-M) tablet Take 1 tablet by mouth daily. 400 mcg   No facility-administered encounter medications on file as of 07/19/2020.    Review of Systems:  Review of Systems  Unable to perform ROS: Dementia    Health Maintenance  Topic Date Due  . TETANUS/TDAP  04/17/2021  . INFLUENZA VACCINE  Completed  . DEXA SCAN   Completed  . COVID-19 Vaccine  Completed  . PNA vac Low Risk Adult  Completed    Physical Exam: Vitals:   07/19/20 1608  BP: 134/78  Pulse: 72  Resp: (!) 22  Temp: 97.7 F (36.5 C)  SpO2: 98%  Weight: 179 lb 6.4 oz (81.4 kg)  Height: 5\' 6"  (1.676 m)   Body mass index is 28.96 kg/m. Physical Exam Vitals reviewed.  Constitutional:      Appearance: Normal appearance.  HENT:     Head: Normocephalic.     Nose: Nose normal.     Mouth/Throat:     Mouth: Mucous membranes are moist.     Pharynx: Oropharynx is clear.  Eyes:     Pupils: Pupils are equal, round, and reactive to light.  Cardiovascular:     Rate and Rhythm: Normal rate and regular rhythm.     Pulses: Normal pulses.  Pulmonary:     Effort: Pulmonary effort is normal.     Breath sounds: Normal breath sounds.  Abdominal:     General: Abdomen is flat. Bowel sounds are normal.     Palpations: Abdomen is soft.  Musculoskeletal:        General: No swelling.     Cervical back: Neck supple.  Skin:    General: Skin is warm.  Neurological:     General: No focal deficit present.     Mental Status: She is alert.     Comments: Walks with no assists Gait Unstable Gets irritated due to Aphsia  Psychiatric:        Mood and Affect: Mood normal.        Thought Content: Thought content normal.     Labs reviewed: Basic Metabolic Panel: Recent Labs    10/02/19 0000 11/25/19 0000 06/30/20 0000  NA 141 140 141  142  K 4.1 3.8 3.3*  3.7  CL 107 104 102  105  CO2 25* 29* 30*  26*  BUN 21 20 22*  22*  CREATININE 0.6 0.7 0.7  0.7  CALCIUM 8.9 9.4 9.7  9.5  TSH  --   --  3.24   Liver Function Tests: Recent Labs    10/02/19 0000 11/25/19 0000 06/30/20 0000  AST 20 19 18  18   ALT 16 15 18  16   ALKPHOS 68 68 82  70  ALBUMIN 3.6 3.6 4.2  3.7   No results for input(s): LIPASE, AMYLASE in the last 8760 hours. No results for input(s): AMMONIA in the last 8760 hours. CBC: Recent Labs    10/02/19 0000  11/25/19 0000 06/30/20 0000  WBC 7.2 8.1 7.6  8.4  NEUTROABS 3,586 4,163 3,838.00  4,662.00  HGB 14.1 13.8 15.1  14.1  HCT 42 42 46  42  PLT  --  224 275  228   Lipid Panel: No results for input(s): CHOL, HDL, LDLCALC, TRIG, CHOLHDL, LDLDIRECT in the last 8760 hours. Lab Results  Component Value Date   HGBA1C 5.3 04/26/2017    Procedures since last visit: No results found.  Assessment/Plan . Essential hypertension, benign On Tenormin  Polymyalgia rheumatica (HCC) Low Dose Prednisone Per family Rheumatology wanted him on low dose DEXA scan in 10/18 showed T score -1 Late onset Alzheimer's disease without behavioral disturbance (HCC) No Tolerated Aricept and Namenda Severe Apahsia In Memory unit Not having any behavior issues now Depression, recurrent (HCC) On Zolloft No GDR right now   Labs/tests ordered:  * No order type specified * Next appt:  Visit date not found

## 2020-07-30 ENCOUNTER — Non-Acute Institutional Stay (SKILLED_NURSING_FACILITY): Payer: Medicare PPO | Admitting: Internal Medicine

## 2020-07-30 ENCOUNTER — Encounter: Payer: Self-pay | Admitting: Internal Medicine

## 2020-07-30 DIAGNOSIS — M7989 Other specified soft tissue disorders: Secondary | ICD-10-CM | POA: Diagnosis not present

## 2020-07-30 NOTE — Progress Notes (Signed)
Location: [facility] Room Number: 110 Place of Service:  SNF [phone])  Provider: Einar Crow MD  Code Status:DNR Goals of Care:  Advanced Directives 06/09/2020  Does Patient Have a Medical Advance Directive? Yes  Type of Advance Directive Out of facility DNR (pink MOST or yellow form);Healthcare Power of Attorney  Does patient want to make changes to medical advance directive? No - Patient declined  Copy of Healthcare Power of Attorney in Chart? Yes - validated most recent copy scanned in chart (See row information)  Would patient like information on creating a medical advance directive? -  Pre-existing out of facility DNR order (yellow form or pink MOST form) Pink MOST form placed in chart (order not valid for inpatient use);Yellow form placed in chart (order not valid for inpatient use)     Chief Complaint  Patient presents with  . Acute Visit    Swelling gin finger     HPI: Patient is a 86 y.o. female seen today for an acute visit for Swelling of her Middle Finger in Left hand  Patient has h/o Frontal temporal Dementia with aphasia, Hypertension,  PMR on Chronic Prednisone,  Anxiety with Depression Now is in Memory unit due to her behavior issues including Elopement from the facility.  Nurses noticed her c/o Pain in her Middle finger in left hand which was swollen and slightly red No history if injury Patient unable to tell any history Does not  let us examine as c/o Pain   Past Medical History:  Diagnosis Date  . Acute bronchitis 06/27/2015  . Alzheimer's dementia (HCC)   . Cataract   . Cellulitis of left leg   . Depression   . Eczema   . High cholesterol   . History of renal stone    excision  . Hypertension   . Mild cognitive impairment   . Osteoarthritis   . Polymyalgia rheumatica (HCC)    rx with prednisone  . Urinary incontinence   . Varicose veins   . Xerosis of skin     Past Surgical History:  Procedure Laterality Date    . cataract surgery    . EYE SURGERY    . JOINT REPLACEMENT Bilateral    Dr. Trudee Grip  . kidney stone surgey    . lipoma removal      Allergies  Allergen Reactions  . Donepezil Other (See Comments)    BAD DREAMS  . Namenda [Memantine]   . Remeron [Mirtazapine] Other (See Comments)    Dizzy     Outpatient Encounter Medications as of 07/30/2020  Medication Sig  . acetaminophen (TYLENOL) 500 MG tablet Take 1,000 mg by mouth every 8 (eight) hours as needed.   Marland Kitchen atenolol (TENORMIN) 25 MG tablet Take 25 mg by mouth daily.  . calcium carbonate (OSCAL) 1500 (600 Ca) MG TABS tablet Take 600 mg of elemental calcium by mouth 2 (two) times daily with a meal.  . Carboxymethylcellulose Sodium (THERATEARS OP) Apply 1 drop to eye 4 (four) times daily. Both eyes  . cholecalciferol (VITAMIN D) 1000 units tablet Take 1,000 Units by mouth daily.  Marland Kitchen docusate sodium (COLACE) 100 MG capsule Take 100 mg by mouth 2 (two) times daily.  . Eyelid Cleansers (OCUSOFT BABY EYELID & EYELASH EX) Apply topically. 1 PAD BOTH EYES; CLEAN THE LIDS 2 TIMES A DAY  . hydrocortisone cream 1 % Apply 1 application topically 2 (two) times daily as needed. Apply to the chest   . NON  FORMULARY Diet Regular Special Instructions: Give prune juice daily with breakfast  . Omega-3 Fatty Acids (FISH OIL) 1200 MG CAPS Take 1 capsule by mouth daily.   . polyethylene glycol (MIRALAX / GLYCOLAX) 17 g packet Take 17 g by mouth daily.  . predniSONE (DELTASONE) 1 MG tablet Take 3 mg by mouth daily.   . sertraline (ZOLOFT) 100 MG tablet Take 100 mg by mouth daily. Take with 25 mg = 125 mg.  . sertraline (ZOLOFT) 25 MG tablet Take 25 mg by mouth daily. 100 mg +25 mg= 125 mg  . therapeutic multivitamin-minerals (THERAGRAN-M) tablet Take 1 tablet by mouth daily. 400 mcg   No facility-administered encounter medications on file as of 07/30/2020.    Review of Systems:  Review of Systems  Health Maintenance  Topic Date Due  .  TETANUS/TDAP  04/17/2021  . INFLUENZA VACCINE  Completed  . DEXA SCAN  Completed  . COVID-19 Vaccine  Completed  . PNA vac Low Risk Adult  Completed    Physical Exam: Vitals:   07/30/20 1549  BP: (!) 152/74  Pulse: 76  Resp: 20  Temp: 97.8 F (36.6 C)  SpO2: 96%  Weight: 176 lb 9.6 oz (80.1 kg)  Height: 5\' 6"  (1.676 m)   Body mass index is 28.5 kg/m. Physical Exam  Labs reviewed: Basic Metabolic Panel: Recent Labs    11/25/19 0000 06/30/20 0000 07/07/20 0000  NA 140 141  142 141  K 3.8 3.3*  3.7 3.3*  CL 104 102  105 102  CO2 29* 30*  26* 30*  BUN 20 22*  22* 22*  CREATININE 0.7 0.7  0.7 0.7  CALCIUM 9.4 9.7  9.5 9.7  TSH  --  3.24 3.24   Liver Function Tests: Recent Labs    11/25/19 0000 06/30/20 0000 07/07/20 0000  AST 19 18  18 18   ALT 15 18  16 18   ALKPHOS 68 82  70 82  ALBUMIN 3.6 4.2  3.7 4.2   No results for input(s): LIPASE, AMYLASE in the last 8760 hours. No results for input(s): AMMONIA in the last 8760 hours. CBC: Recent Labs    11/25/19 0000 06/30/20 0000 07/07/20 0000  WBC 8.1 7.6  8.4 7.6  NEUTROABS 4,163 3,838.00  4,662.00 3,838.00  HGB 13.8 15.1  14.1 15.1  HCT 42 46  42 46  PLT 224 275  228 275   Lipid Panel: No results for input(s): CHOL, HDL, LDLCALC, TRIG, CHOLHDL, LDLDIRECT in the last 8760 hours. Lab Results  Component Value Date   HGBA1C 5.3 04/26/2017    Procedures since last visit: No results found.  Assessment/Plan 1. Finger swelling ? Infection or 13/03/21 of the Left hand to rule out Fracture   Other issues  Essential hypertension, benign On Tenormin  Polymyalgia rheumatica (HCC) Low Dose Prednisone Per family Rheumatology wanted him on low dose DEXA scan in 10/18 showed T score -1 Late onset Alzheimer's disease without behavioral disturbance (HCC) No Tolerated Aricept and Namenda Severe Apahsia In Memory unit Not having any behavior issues now Depression, recurrent (HCC) On  Zolloft No GDR right now  Labs/tests ordered:  * No order type specified * Next appt:  Visit date not found

## 2020-07-31 DIAGNOSIS — M79642 Pain in left hand: Secondary | ICD-10-CM | POA: Diagnosis not present

## 2020-08-11 ENCOUNTER — Encounter: Payer: Self-pay | Admitting: Nurse Practitioner

## 2020-08-11 ENCOUNTER — Non-Acute Institutional Stay (SKILLED_NURSING_FACILITY): Payer: Medicare PPO | Admitting: Nurse Practitioner

## 2020-08-11 DIAGNOSIS — I10 Essential (primary) hypertension: Secondary | ICD-10-CM | POA: Diagnosis not present

## 2020-08-11 DIAGNOSIS — F339 Major depressive disorder, recurrent, unspecified: Secondary | ICD-10-CM | POA: Diagnosis not present

## 2020-08-11 DIAGNOSIS — K5901 Slow transit constipation: Secondary | ICD-10-CM

## 2020-08-11 DIAGNOSIS — M353 Polymyalgia rheumatica: Secondary | ICD-10-CM

## 2020-08-11 NOTE — Assessment & Plan Note (Signed)
HTN, blood pressure is controlled, on Atenolol 25mg  qd. Bun/creat 22/0.7 07/07/20

## 2020-08-11 NOTE — Assessment & Plan Note (Signed)
PMR, stable, on Prednisone 3mg qd.Prn Tylenol 

## 2020-08-11 NOTE — Assessment & Plan Note (Addendum)
Depression/anxiety, takesSertraline 125mg qd,TSH 3.24 07/07/20  

## 2020-08-11 NOTE — Assessment & Plan Note (Signed)
Constipation, takes Colace, Miralax. 

## 2020-08-11 NOTE — Progress Notes (Signed)
Location:   [facility] Room Number: 110 Place of Service:  SNF (31) Provider:  Raekwan Spelman, Arna Snipe NP  Camyra Vaeth X, NP  Patient Care Team: Pranish Akhavan X, NP as PCP - General (Internal Medicine) Donnetta Hail, MD (Rheumatology) Ernesto Rutherford, MD (Ophthalmology) Nita Sells, MD (Dermatology) Crist Fat, MD as Attending Physician (Urology) Felix Pratt X, NP as Nurse Practitioner (Internal Medicine)  Extended Emergency Contact Information Primary Emergency Contact: McClanahan,Susan Address: [address] [place], [place] [address] [place] Home Phone: [phone] Mobile Phone: [phone] Relation: Daughter Secondary Emergency Contact: Gardiner Sleeper [place] of [place] Home Phone: [phone] Relation: Daughter  Code Status:  DNR Goals of care: Advanced Directive information Advanced Directives 06/09/2020  Does Patient Have a Medical Advance Directive? Yes  Type of Advance Directive Out of facility DNR (pink MOST or yellow form);Healthcare Power of Attorney  Does patient want to make changes to medical advance directive? No - Patient declined  Copy of Healthcare Power of Attorney in Chart? Yes - validated most recent copy scanned in chart (See row information)  Would patient like information on creating a medical advance directive? -  Pre-existing out of facility DNR order (yellow form or pink MOST form) Pink MOST form placed in chart (order not valid for inpatient use);Yellow form placed in chart (order not valid for inpatient use)     Chief Complaint  Patient presents with   Medical Management of Chronic Issues    HPI:  Pt is a 86 y.o. female seen today for medical management of chronic diseases.    Depression/anxiety, takesSertraline 125mg  qd, TSH 3.24 07/07/20 HTN, blood pressure is controlled, on Atenolol 25mg  qd. Bun/creat 22/0.7 07/07/20 PMR, stable, on Prednisone 3mg   qd.Prn Tylenol. Constipation, takes Colace, Miralax.    Past Medical History:  Diagnosis Date   Acute bronchitis 06/27/2015   Alzheimer's dementia (HCC)    Cataract    Cellulitis of left leg    Depression    Eczema    High cholesterol    History of renal stone    excision   Hypertension    Mild cognitive impairment    Osteoarthritis    Polymyalgia rheumatica (HCC)    rx with prednisone   Urinary incontinence    Varicose veins    Xerosis of skin    Past Surgical History:  Procedure Laterality Date   cataract surgery     EYE SURGERY     JOINT REPLACEMENT Bilateral    Dr. 13/10/21   kidney stone surgey     lipoma removal      Allergies  Allergen Reactions   Donepezil Other (See Comments)    BAD DREAMS   Namenda [Memantine]    Remeron [Mirtazapine] Other (See Comments)    Dizzy     Allergies as of 08/11/2020      Reactions   Donepezil Other (See Comments)   BAD DREAMS   Namenda [memantine]    Remeron [mirtazapine] Other (See Comments)   Dizzy       Medication List       Accurate as of August 11, 2020 11:59 PM. If you have any questions, ask your nurse or doctor.        acetaminophen 500 MG tablet Commonly known as: TYLENOL Take 1,000 mg by mouth every 8 (eight) hours as needed.   Anusol-HC 2.5 % rectal cream Generic drug: hydrocortisone Place 1  application rectally 2 (two) times daily.   atenolol 25 MG tablet Commonly known as: TENORMIN Take 25 mg by mouth daily.   calcium carbonate 1500 (600 Ca) MG Tabs tablet Commonly known as: OSCAL Take 600 mg of elemental calcium by mouth 2 (two) times daily with a meal.   cholecalciferol 25 MCG (1000 UNIT) tablet Commonly known as: VITAMIN D Take 1,000 Units by mouth daily.   docusate sodium 100 MG capsule Commonly known as: COLACE Take 100 mg by mouth 2 (two) times daily.   Fish Oil 1200 MG Caps Take 1 capsule by mouth daily.   hydrocortisone cream  1 % Apply 1 application topically 2 (two) times daily as needed. Apply to the chest   NON FORMULARY Diet Regular Special Instructions: Give prune juice daily with breakfast   OCUSOFT BABY EYELID & EYELASH EX Apply topically. 1 PAD BOTH EYES; CLEAN THE LIDS 2 TIMES A DAY   polyethylene glycol 17 g packet Commonly known as: MIRALAX / GLYCOLAX Take 17 g by mouth daily.   predniSONE 1 MG tablet Commonly known as: DELTASONE Take 3 mg by mouth daily.   sertraline 100 MG tablet Commonly known as: ZOLOFT Take 100 mg by mouth daily. Take with 25 mg = 125 mg.   sertraline 25 MG tablet Commonly known as: ZOLOFT Take 25 mg by mouth daily. 100 mg +25 mg= 125 mg   therapeutic multivitamin-minerals tablet Take 1 tablet by mouth daily. 400 mcg   THERATEARS OP Apply 1 drop to eye 4 (four) times daily. Both eyes       Review of Systems  Constitutional: Negative for activity change, fever and unexpected weight change.  HENT: Positive for hearing loss. Negative for congestion and voice change.   Eyes: Negative for visual disturbance.  Respiratory: Negative for shortness of breath.   Cardiovascular: Negative for leg swelling.  Gastrointestinal: Negative for abdominal pain and constipation.  Genitourinary: Negative for dysuria, frequency and urgency.  Musculoskeletal: Positive for arthralgias, gait problem and myalgias.  Skin: Negative for color change.  Neurological: Negative for speech difficulty, weakness and headaches.       Dementia  Psychiatric/Behavioral: Positive for confusion. Negative for agitation, behavioral problems and sleep disturbance. The patient is not nervous/anxious.     Immunization History  Administered Date(s) Administered   Influenza Whole 05/28/2012, 06/01/2018   Influenza, High Dose Seasonal PF 06/08/2016   Influenza-Unspecified 05/28/2014, 08/28/2014, 05/27/2015, 06/18/2017, 06/10/2020   Moderna Sars-Covid-2 Vaccination 08/30/2019, 09/27/2019    Pneumococcal Conjugate-13 01/21/2014   Pneumococcal Polysaccharide-23 04/16/2015   Td 09/29/2003   Zoster 01/14/2011   Pertinent  Health Maintenance Due  Topic Date Due   INFLUENZA VACCINE  Completed   DEXA SCAN  Completed   PNA vac Low Risk Adult  Completed   Fall Risk  05/17/2018 05/10/2017 05/01/2017 07/17/2016 06/09/2015  Falls in the past year? No No No Yes No  Comment - - - tripped over the last stair in the house -  Number falls in past yr: - - - 1 -   Functional Status Survey:    Vitals:   08/11/20 1014  BP: 128/78  Pulse: 76  Resp: 20  Temp: 97.7 F (36.5 C)  SpO2: 98%  Weight: 176 lb 9.6 oz (80.1 kg)  Height: 5\' 6"  (1.676 m)   Body mass index is 28.5 kg/m. Physical Exam Vitals and nursing note reviewed.  Constitutional:      Appearance: Normal appearance.  HENT:     Head: Normocephalic and  atraumatic.     Mouth/Throat:     Mouth: Mucous membranes are moist.  Eyes:     Extraocular Movements: Extraocular movements intact.     Conjunctiva/sclera: Conjunctivae normal.     Pupils: Pupils are equal, round, and reactive to light.  Cardiovascular:     Rate and Rhythm: Normal rate. Rhythm irregular.     Heart sounds: No murmur heard.   Pulmonary:     Breath sounds: Rhonchi present. No rales.  Abdominal:     General: Bowel sounds are normal.     Palpations: Abdomen is soft.     Tenderness: There is no abdominal tenderness.  Musculoskeletal:     Cervical back: Normal range of motion and neck supple.     Right lower leg: No edema.     Left lower leg: No edema.  Skin:    General: Skin is warm and dry.     Comments: Left forearm skin abrasion is scabbed over.   Neurological:     General: No focal deficit present.     Mental Status: She is alert. Mental status is at baseline.     Gait: Gait abnormal.     Comments: Oriented to self. Ambulates with walker.   Psychiatric:     Comments: Confused. Smiled. Followed directions.      Labs reviewed: Recent  Labs    11/25/19 0000 06/30/20 0000 07/07/20 0000  NA 140 141   142 141  K 3.8 3.3*   3.7 3.3*  CL 104 102   105 102  CO2 29* 30*   26* 30*  BUN 20 22*   22* 22*  CREATININE 0.7 0.7   0.7 0.7  CALCIUM 9.4 9.7   9.5 9.7   Recent Labs    11/25/19 0000 06/30/20 0000 07/07/20 0000  AST 19 18   18 18   ALT 15 18   16 18   ALKPHOS 68 82   70 82  ALBUMIN 3.6 4.2   3.7 4.2   Recent Labs    11/25/19 0000 06/30/20 0000 07/07/20 0000  WBC 8.1 7.6   8.4 7.6  NEUTROABS 4,163 3,838.00   4,662.00 3,838.00  HGB 13.8 15.1   14.1 15.1  HCT 42 46   42 46  PLT 224 275   228 275   Lab Results  Component Value Date   TSH 3.24 07/07/2020   Lab Results  Component Value Date   HGBA1C 5.3 04/26/2017   Lab Results  Component Value Date   CHOL 217 (A) 04/09/2018   HDL 51 04/09/2018   LDLCALC 147 04/09/2018   LDLDIRECT 173.0 08/07/2012   TRIG 86 04/09/2018   CHOLHDL 5 03/20/2016    Significant Diagnostic Results in last 30 days:  No results found.  Assessment/Plan There are no diagnoses linked to this encounter.   Family/ staff Communication:   Labs/tests ordered:

## 2020-08-12 ENCOUNTER — Encounter: Payer: Self-pay | Admitting: Nurse Practitioner

## 2020-08-26 DIAGNOSIS — E876 Hypokalemia: Secondary | ICD-10-CM | POA: Diagnosis not present

## 2020-08-26 DIAGNOSIS — I10 Essential (primary) hypertension: Secondary | ICD-10-CM | POA: Diagnosis not present

## 2020-08-26 LAB — BASIC METABOLIC PANEL
BUN: 24 — AB (ref 4–21)
CO2: 29 — AB (ref 13–22)
Chloride: 105 (ref 99–108)
Glucose: 80
Potassium: 3.8 (ref 3.4–5.3)
Sodium: 140 (ref 137–147)

## 2020-08-26 LAB — COMPREHENSIVE METABOLIC PANEL
Calcium: 8.9 (ref 8.7–10.7)
GFR calc Af Amer: 91
GFR calc non Af Amer: 79

## 2020-09-22 ENCOUNTER — Non-Acute Institutional Stay (SKILLED_NURSING_FACILITY): Payer: Medicare PPO | Admitting: Nurse Practitioner

## 2020-09-22 ENCOUNTER — Encounter: Payer: Self-pay | Admitting: Nurse Practitioner

## 2020-09-22 DIAGNOSIS — M353 Polymyalgia rheumatica: Secondary | ICD-10-CM | POA: Diagnosis not present

## 2020-09-22 DIAGNOSIS — F339 Major depressive disorder, recurrent, unspecified: Secondary | ICD-10-CM

## 2020-09-22 DIAGNOSIS — I10 Essential (primary) hypertension: Secondary | ICD-10-CM

## 2020-09-22 DIAGNOSIS — K5901 Slow transit constipation: Secondary | ICD-10-CM

## 2020-09-22 NOTE — Progress Notes (Signed)
Location:  [facility] Room Number: 110-A Place of Service:  SNF (31) Provider:  Maliek Schellhorn X, NP  Patient Care Team: Yussuf Sawyers X, NP as PCP - General (Internal Medicine) Donnetta Hail, MD (Rheumatology) Ernesto Rutherford, MD (Ophthalmology) Nita Sells, MD (Dermatology) Crist Fat, MD as Attending Physician (Urology) Pritesh Sobecki X, NP as Nurse Practitioner (Internal Medicine)  Extended Emergency Contact Information Primary Emergency Contact: McClanahan,Susan Address: [address] [place], [place] [address] [place] Home Phone: [phone] Mobile Phone: [phone] Relation: Daughter Secondary Emergency Contact: Gardiner Sleeper [place] of [place] Home Phone: [phone] Relation: Daughter  Code Status:  DNR Goals of care: Advanced Directive information Advanced Directives 09/22/2020  Does Patient Have a Medical Advance Directive? Yes  Type of Advance Directive Out of facility DNR (pink MOST or yellow form)  Does patient want to make changes to medical advance directive? No - Patient declined  Copy of Healthcare Power of Attorney in Chart? -  Would patient like information on creating a medical advance directive? -  Pre-existing out of facility DNR order (yellow form or pink MOST form) Pink MOST form placed in chart (order not valid for inpatient use);Yellow form placed in chart (order not valid for inpatient use)     Chief Complaint  Patient presents with  . Medical Management of Chronic Issues    Routine [facility] visit    HPI:  Pt is a 87 y.o. female seen today for medical management of chronic diseases.    Depression/anxiety, takesSertraline 125mg  qd, TSH 3.24 07/07/20 HTN, blood pressure is controlled, on Atenolol 25mg  qd.  PMR, stable, on Prednisone 3mg  qd.Prn Tylenol. Constipation, takes Colace, Miralax.    Past Medical History:   Diagnosis Date  . Acute bronchitis 06/27/2015  . Alzheimer's dementia (HCC)   . Cataract   . Cellulitis of left leg   . Depression   . Eczema   . High cholesterol   . History of renal stone    excision  . Hypertension   . Mild cognitive impairment   . Osteoarthritis   . Polymyalgia rheumatica (HCC)    rx with prednisone  . Urinary incontinence   . Varicose veins   . Xerosis of skin    Past Surgical History:  Procedure Laterality Date  . cataract surgery    . EYE SURGERY    . JOINT REPLACEMENT Bilateral    Dr.  . kidney stone surgey    . lipoma removal      Allergies  Allergen Reactions  . Donepezil Other (See Comments)    BAD DREAMS  . Namenda [Memantine]   . Remeron [Mirtazapine] Other (See Comments)    Dizzy     Outpatient Encounter Medications as of 09/22/2020  Medication Sig  . acetaminophen (TYLENOL) 500 MG tablet Take 1,000 mg by mouth every 8 (eight) hours as needed.   06/29/2015 atenolol (TENORMIN) 25 MG tablet Take 25 mg by mouth daily.  . calcium carbonate (OSCAL) 1500 (600 Ca) MG TABS tablet Take 600 mg of elemental calcium by mouth 2 (two) times daily with a meal.  . Carboxymethylcellulose Sodium (THERATEARS OP) Apply 1 drop to eye 4 (four) times daily. Both eyes  . cholecalciferol (VITAMIN D) 1000 units tablet Take 1,000 Units by mouth daily.  Trudee Grip docusate sodium (COLACE) 100 MG capsule Take 100 mg by mouth 2 (two) times daily.  09/24/2020  Eyelid Cleansers (OCUSOFT BABY EYELID & EYELASH EX) Apply topically. 1 PAD BOTH EYES; CLEAN THE LIDS 2 TIMES A DAY  . hydrocortisone (ANUSOL-HC) 2.5 % rectal cream Place 1 application rectally 2 (two) times daily.  Marland Kitchen LORazepam (ATIVAN) 0.5 MG tablet Take 0.25 mg by mouth daily as needed for anxiety.  . NON FORMULARY Diet Regular Special Instructions: Give prune juice daily with breakfast  . Omega-3 Fatty Acids (FISH OIL) 1200 MG CAPS Take 1 capsule by mouth daily.   . polyethylene glycol (MIRALAX / GLYCOLAX) 17 g packet  Take 17 g by mouth daily.  . predniSONE (DELTASONE) 1 MG tablet Take 3 mg by mouth daily.   . sertraline (ZOLOFT) 100 MG tablet Take 100 mg by mouth daily. Take with 25 mg = 125 mg.  . sertraline (ZOLOFT) 25 MG tablet Take 25 mg by mouth daily. 100 mg +25 mg= 125 mg  . therapeutic multivitamin-minerals (THERAGRAN-M) tablet Take 1 tablet by mouth daily. 400 mcg  . [DISCONTINUED] hydrocortisone cream 1 % Apply 1 application topically 2 (two) times daily as needed. Apply to the chest   No facility-administered encounter medications on file as of 09/22/2020.    Review of Systems  Constitutional: Negative for activity change, fever and unexpected weight change.  HENT: Positive for hearing loss. Negative for congestion and voice change.   Eyes: Negative for visual disturbance.  Respiratory: Negative for shortness of breath.   Cardiovascular: Negative for leg swelling.  Gastrointestinal: Negative for abdominal pain and constipation.  Genitourinary: Negative for dysuria, frequency and urgency.  Musculoskeletal: Positive for arthralgias, gait problem and myalgias.  Skin: Negative for color change.  Neurological: Negative for speech difficulty, weakness and headaches.       Dementia  Psychiatric/Behavioral: Positive for confusion. Negative for agitation, behavioral problems and sleep disturbance. The patient is not nervous/anxious.     Immunization History  Administered Date(s) Administered  . Influenza Whole 05/28/2012, 06/01/2018  . Influenza, High Dose Seasonal PF 06/08/2016  . Influenza-Unspecified 05/28/2014, 08/28/2014, 05/27/2015, 06/18/2017, 06/10/2020  . Moderna Sars-Covid-2 Vaccination 08/30/2019, 09/27/2019, 07/06/2020  . Pneumococcal Conjugate-13 01/21/2014  . Pneumococcal Polysaccharide-23 04/16/2015  . Td 09/29/2003  . Zoster 01/14/2011   Pertinent  Health Maintenance Due  Topic Date Due  . INFLUENZA VACCINE  Completed  . DEXA SCAN  Completed  . PNA vac Low Risk Adult   Completed   Fall Risk  05/17/2018 05/10/2017 05/01/2017 07/17/2016 06/09/2015  Falls in the past year? No No No Yes No  Comment - - - tripped over the last stair in the house -  Number falls in past yr: - - - 1 -   Functional Status Survey:    Vitals:   09/22/20 1045  BP: 134/78  Pulse: 70  Resp: (!) 22  Temp: 97.8 F (36.6 C)  TempSrc: Oral  SpO2: 96%  Weight: 178 lb 12.8 oz (81.1 kg)  Height: 5\' 6"  (1.676 m)   Body mass index is 28.86 kg/m. Physical Exam Vitals and nursing note reviewed.  Constitutional:      Appearance: Normal appearance.  HENT:     Head: Normocephalic and atraumatic.     Mouth/Throat:     Mouth: Mucous membranes are moist.  Eyes:     Extraocular Movements: Extraocular movements intact.     Conjunctiva/sclera: Conjunctivae normal.     Pupils: Pupils are equal, round, and reactive to light.  Cardiovascular:     Rate and Rhythm: Normal rate. Rhythm irregular.     Heart  sounds: No murmur heard.   Pulmonary:     Breath sounds: Rhonchi present. No rales.  Abdominal:     General: Bowel sounds are normal.     Palpations: Abdomen is soft.     Tenderness: There is no abdominal tenderness.  Musculoskeletal:     Cervical back: Normal range of motion and neck supple.     Right lower leg: No edema.     Left lower leg: No edema.  Skin:    General: Skin is warm and dry.     Comments: Left forearm skin abrasion is scabbed over.   Neurological:     General: No focal deficit present.     Mental Status: She is alert. Mental status is at baseline.     Gait: Gait abnormal.     Comments: Oriented to self. Ambulates with walker.   Psychiatric:     Comments: Confused. Smiled. Followed directions.      Labs reviewed: Recent Labs    11/25/19 0000 06/30/20 0000 07/07/20 0000 08/26/20 0000  NA 140 141  142 141 140  K 3.8 3.3*  3.7 3.3* 3.8  CL 104 102  105 102 105  CO2 29* 30*  26* 30* 29*  BUN 20 22*  22* 22* 24*  CREATININE 0.7 0.7  0.7 0.7  --    CALCIUM 9.4 9.7  9.5 9.7 8.9   Recent Labs    11/25/19 0000 06/30/20 0000 07/07/20 0000  AST 19 18  18 18   ALT 15 18  16 18   ALKPHOS 68 82  70 82  ALBUMIN 3.6 4.2  3.7 4.2   Recent Labs    11/25/19 0000 06/30/20 0000 07/07/20 0000  WBC 8.1 7.6  8.4 7.6  NEUTROABS 4,163 3,838.00  4,662.00 3,838.00  HGB 13.8 15.1  14.1 15.1  HCT 42 46  42 46  PLT 224 275  228 275   Lab Results  Component Value Date   TSH 3.24 07/07/2020   Lab Results  Component Value Date   HGBA1C 5.3 04/26/2017   Lab Results  Component Value Date   CHOL 217 (A) 04/09/2018   HDL 51 04/09/2018   LDLCALC 147 04/09/2018   LDLDIRECT 173.0 08/07/2012   TRIG 86 04/09/2018   CHOLHDL 5 03/20/2016    Significant Diagnostic Results in last 30 days:  No results found.  Assessment/Plan Slow transit constipation Constipation, takes Colace, Miralax.  Polymyalgia rheumatica (HCC) PMR, stable, on Prednisone 3mg  qd.Prn Tylenol  Essential hypertension, benign HTN, blood pressure is controlled, on Atenolol 25mg  qd.   Depression, recurrent (HCC) Depression/anxiety, takesSertraline 125mg  qd, TSH 3.24 07/07/20      Family/ staff Communication: plan of care reviewed with the patient and charge nurse.   Labs/tests ordered: none  Time spend 35 minutes.

## 2020-09-22 NOTE — Assessment & Plan Note (Signed)
Depression/anxiety, takesSertraline 125mg  qd, TSH 3.24 07/07/20

## 2020-09-22 NOTE — Assessment & Plan Note (Signed)
PMR, stable, on Prednisone 3mg  qd.Prn Tylenol

## 2020-09-22 NOTE — Assessment & Plan Note (Signed)
HTN, blood pressure is controlled, on Atenolol 25mg  qd.

## 2020-09-22 NOTE — Assessment & Plan Note (Signed)
Constipation, takes Colace, Miralax.

## 2020-09-23 ENCOUNTER — Encounter: Payer: Self-pay | Admitting: Nurse Practitioner

## 2020-10-17 ENCOUNTER — Emergency Department (HOSPITAL_COMMUNITY): Payer: Medicare PPO

## 2020-10-17 ENCOUNTER — Other Ambulatory Visit: Payer: Self-pay

## 2020-10-17 ENCOUNTER — Emergency Department (HOSPITAL_COMMUNITY)
Admission: EM | Admit: 2020-10-17 | Discharge: 2020-10-17 | Disposition: A | Discharge status: Home / Self Care | Payer: Medicare PPO | Attending: Emergency Medicine | Admitting: Emergency Medicine

## 2020-10-17 ENCOUNTER — Encounter (HOSPITAL_COMMUNITY): Payer: Self-pay

## 2020-10-17 DIAGNOSIS — Z87891 Personal history of nicotine dependence: Secondary | ICD-10-CM | POA: Diagnosis not present

## 2020-10-17 DIAGNOSIS — I10 Essential (primary) hypertension: Secondary | ICD-10-CM | POA: Diagnosis not present

## 2020-10-17 DIAGNOSIS — M25522 Pain in left elbow: Secondary | ICD-10-CM | POA: Diagnosis not present

## 2020-10-17 DIAGNOSIS — W19XXXA Unspecified fall, initial encounter: Secondary | ICD-10-CM

## 2020-10-17 DIAGNOSIS — Z23 Encounter for immunization: Secondary | ICD-10-CM | POA: Insufficient documentation

## 2020-10-17 DIAGNOSIS — S0990XA Unspecified injury of head, initial encounter: Secondary | ICD-10-CM | POA: Diagnosis not present

## 2020-10-17 DIAGNOSIS — S8991XA Unspecified injury of right lower leg, initial encounter: Secondary | ICD-10-CM | POA: Diagnosis present

## 2020-10-17 DIAGNOSIS — S51012A Laceration without foreign body of left elbow, initial encounter: Secondary | ICD-10-CM | POA: Insufficient documentation

## 2020-10-17 DIAGNOSIS — W010XXA Fall on same level from slipping, tripping and stumbling without subsequent striking against object, initial encounter: Secondary | ICD-10-CM | POA: Insufficient documentation

## 2020-10-17 DIAGNOSIS — M25552 Pain in left hip: Secondary | ICD-10-CM | POA: Diagnosis not present

## 2020-10-17 DIAGNOSIS — F0280 Dementia in other diseases classified elsewhere without behavioral disturbance: Secondary | ICD-10-CM | POA: Diagnosis not present

## 2020-10-17 DIAGNOSIS — S81012A Laceration without foreign body, left knee, initial encounter: Secondary | ICD-10-CM | POA: Insufficient documentation

## 2020-10-17 DIAGNOSIS — Z9660 Presence of unspecified orthopedic joint implant: Secondary | ICD-10-CM | POA: Insufficient documentation

## 2020-10-17 DIAGNOSIS — Y9301 Activity, walking, marching and hiking: Secondary | ICD-10-CM | POA: Insufficient documentation

## 2020-10-17 DIAGNOSIS — S81811A Laceration without foreign body, right lower leg, initial encounter: Secondary | ICD-10-CM | POA: Diagnosis not present

## 2020-10-17 DIAGNOSIS — M7989 Other specified soft tissue disorders: Secondary | ICD-10-CM | POA: Diagnosis not present

## 2020-10-17 DIAGNOSIS — Y92838 Other recreation area as the place of occurrence of the external cause: Secondary | ICD-10-CM | POA: Insufficient documentation

## 2020-10-17 DIAGNOSIS — G309 Alzheimer's disease, unspecified: Secondary | ICD-10-CM | POA: Diagnosis not present

## 2020-10-17 DIAGNOSIS — M25551 Pain in right hip: Secondary | ICD-10-CM | POA: Diagnosis not present

## 2020-10-17 DIAGNOSIS — Z79899 Other long term (current) drug therapy: Secondary | ICD-10-CM | POA: Insufficient documentation

## 2020-10-17 DIAGNOSIS — S199XXA Unspecified injury of neck, initial encounter: Secondary | ICD-10-CM | POA: Diagnosis not present

## 2020-10-17 DIAGNOSIS — S81821A Laceration with foreign body, right lower leg, initial encounter: Secondary | ICD-10-CM | POA: Diagnosis not present

## 2020-10-17 MED ORDER — TETANUS-DIPHTH-ACELL PERTUSSIS 5-2.5-18.5 LF-MCG/0.5 IM SUSY
0.5000 mL | PREFILLED_SYRINGE | Freq: Once | INTRAMUSCULAR | Status: AC
  Administered 2020-10-17: 0.5 mL via INTRAMUSCULAR
  Filled 2020-10-17: qty 0.5

## 2020-10-17 MED ORDER — CEPHALEXIN 500 MG PO CAPS: 500.0000 mg | ORAL_CAPSULE | Freq: Four times a day (QID) | ORAL | 0 refills | Status: AC

## 2020-10-17 MED ORDER — FENTANYL CITRATE (PF) 100 MCG/2ML IJ SOLN
12.5000 ug | Freq: Once | INTRAMUSCULAR | Status: DC
  Filled 2020-10-17: qty 2

## 2020-10-17 MED ORDER — FENTANYL CITRATE (PF) 100 MCG/2ML IJ SOLN
12.5000 ug | Freq: Once | INTRAMUSCULAR | Status: AC
  Administered 2020-10-17: 12.5 ug via INTRAMUSCULAR

## 2020-10-17 MED ORDER — LIDOCAINE HCL 2 % IJ SOLN
10.0000 mL | Freq: Once | INTRAMUSCULAR | Status: AC
  Administered 2020-10-17: 200 mg via INTRADERMAL
  Filled 2020-10-17: qty 20

## 2020-10-17 MED ORDER — LIDOCAINE-EPINEPHRINE-TETRACAINE (LET) TOPICAL GEL
3.0000 mL | Freq: Once | TOPICAL | Status: AC
  Administered 2020-10-17: 3 mL via TOPICAL
  Filled 2020-10-17: qty 3

## 2020-10-17 NOTE — Discharge Instructions (Signed)
You were given a prescription for antibiotics. Please take the antibiotic prescription fully.   The patient has sutures and steri strips to the lower leg. The sutures are absorbable and do not need to be removed.   Please follow up with your primary care provider within 5-7 days for re-evaluation of your symptoms.    Please return to the emergency department for any new or worsening symptoms.

## 2020-10-17 NOTE — ED Notes (Signed)
Called [facility] and gave report on pt. Pt was transported back to facility by daughter.

## 2020-10-17 NOTE — ED Triage Notes (Signed)
Staff at [facility] report pt. Tripped and fell. She has lac. On right lower shin area and a skin tear at left elbow and left lat. Knee area. She is awake, alert and confused at her baseline.

## 2020-10-17 NOTE — ED Provider Notes (Signed)
[facility]-EMERGENCY DEPT Provider Note   CSN: [id number] Arrival date & time: 10/17/20  1701     History Chief Complaint  Patient presents with  . Fall    Tricia Mendez is a 87 y.o. female.  HPI   87 y/o F with a h/o alzheimer's dementia, depression, HLD, HTN, osteoarthritis, polymyalgia rheumatica, urinary incontinence, who presents to the emergency department today for evaluation after a fall.  Level 5 caveat as patient has a history of dementia.  5:35 PM Discussed case with staff at [facility] who states that patient was walking today when she tripped over her walker and fell onto her left elbow.  The walker landed on top of her and something cut her right anterior shin.  Patient did not lose consciousness.  They feel that patient has been at her mental baseline recently and has had no fevers or other systemic symptoms prior to today.    Past Medical History:  Diagnosis Date  . Acute bronchitis 06/27/2015  . Alzheimer's dementia (HCC)   . Cataract   . Cellulitis of left leg   . Depression   . Eczema   . High cholesterol   . History of renal stone    excision  . Hypertension   . Mild cognitive impairment   . Osteoarthritis   . Polymyalgia rheumatica (HCC)    rx with prednisone  . Urinary incontinence   . Varicose veins   . Xerosis of skin     Patient Active Problem List   Diagnosis Date Noted  . Irregular heart beats 07/05/2020  . Hemorrhoids 01/28/2020  . Slow transit constipation 01/28/2020  . Hallucination 11/24/2019  . Tachycardia 10/01/2019  . Lesion of skin of face 07/14/2019  . Blepharitis of both eyes 07/14/2019  . Abnormal gait 12/09/2018  . Bradycardia 12/09/2018  . Fall 11/21/2018  . Chronic sinusitis 01/25/2018  . Age-related osteoporosis without current pathological fracture 08/24/2017  . Vitamin D deficiency 05/18/2017  . Postmenopausal estrogen deficiency 05/18/2017  . Counseling regarding advanced care  planning and goals of care 05/01/2017  . Dyslipidemia 04/27/2017  . Late onset Alzheimer's disease without behavioral disturbance (HCC) 07/25/2016  . Decreased hearing 06/10/2015  . OAB (overactive bladder) 02/25/2015  . Essential hypertension, benign 02/25/2015  . Chronic venous insufficiency 02/25/2015  . Incontinent of urine 10/08/2012  . Rectal bleeding hx 08/07/2012  . Venous stasis dermatitis 03/13/2011  . Depression, recurrent (HCC) 02/22/2011  . 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 VARICOSE VEINS, LOWER EXTREMITIES 02/14/2008  . Polymyalgia rheumatica (HCC) 06/25/2007  . Generalized muscle weakness 03/27/2007  . Osteoarthritis 03/07/2007    Past Surgical History:  Procedure Laterality Date  . cataract surgery    . EYE SURGERY    . JOINT REPLACEMENT Bilateral    Dr. Trudee Grip  . kidney stone surgey    . lipoma removal       OB History   No obstetric history on file.     Family History  Problem Relation Age of Onset  . Melanoma Mother        43  . Breast cancer Mother        55  . Stroke Father        39  . Arthritis Father        75  . Diabetes Father 17       borderline diabetic  . Memory loss Sister   . Osteoporosis Other  Social History   Tobacco Use  . Smoking status: Former Smoker    Start date: 08/07/1944    Quit date: 10/13/1945    Years since quitting: 75.0  . Smokeless tobacco: Former NeurosurgeonUser    Quit date: 10/01/1945  Substance Use Topics  . Alcohol use: No    Alcohol/week: 6.0 - 7.0 standard drinks    Types: 6 - 7 Standard drinks or equivalent per week  . Drug use: No    Home Medications Prior to Admission medications   Medication Sig Start Date End Date Taking? Authorizing Provider  cephALEXin (KEFLEX) 500 MG capsule Take 1 capsule (500 mg total) by mouth 4 (four) times daily for 7 days. 10/17/20 10/24/20 Yes ,  S, PA-C  acetaminophen (TYLENOL) 500 MG tablet Take 1,000 mg by mouth every 8  (eight) hours as needed.     [provider]  atenolol (TENORMIN) 25 MG tablet Take 25 mg by mouth daily.    [provider]  calcium carbonate (OSCAL) 1500 (600 Ca) MG TABS tablet Take 600 mg of elemental calcium by mouth 2 (two) times daily with a meal.    [provider]  Carboxymethylcellulose Sodium (THERATEARS OP) Apply 1 drop to eye 4 (four) times daily. Both eyes    [provider]  cholecalciferol (VITAMIN D) 1000 units tablet Take 1,000 Units by mouth daily.    [provider]  docusate sodium (COLACE) 100 MG capsule Take 100 mg by mouth 2 (two) times daily.    [provider]  Eyelid Cleansers (OCUSOFT BABY EYELID & EYELASH EX) Apply topically. 1 PAD BOTH EYES; CLEAN THE LIDS 2 TIMES A DAY    [provider]  hydrocortisone (ANUSOL-HC) 2.5 % rectal cream Place 1 application rectally 2 (two) times daily.    [provider]  LORazepam (ATIVAN) 0.5 MG tablet Take 0.25 mg by mouth daily as needed for anxiety.    [provider]  NON FORMULARY Diet Regular Special Instructions: Give prune juice daily with breakfast    [provider]  Omega-3 Fatty Acids (FISH OIL) 1200 MG CAPS Take 1 capsule by mouth daily.     [provider]  polyethylene glycol (MIRALAX / GLYCOLAX) 17 g packet Take 17 g by mouth daily.    [provider]  predniSONE (DELTASONE) 1 MG tablet Take 3 mg by mouth daily.     Donnetta HailBeekman, James F, MD  sertraline (ZOLOFT) 100 MG tablet Take 100 mg by mouth daily. Take with 25 mg = 125 mg.    [provider]  sertraline (ZOLOFT) 25 MG tablet Take 25 mg by mouth daily. 100 mg +25 mg= 125 mg 05/02/18   [provider]  therapeutic multivitamin-minerals (THERAGRAN-M) tablet Take 1 tablet by mouth daily. 400 mcg    [provider]    Allergies    Donepezil, Namenda [memantine], and Remeron [mirtazapine]  Review of Systems   Review of Systems  Unable to  perform ROS: Dementia  Skin: Positive for wound.    Physical Exam Updated Vital Signs BP (!) 153/94 (BP Location: Left Arm)   Pulse 66   Temp 97.8 F (36.6 C) (Oral)   Resp 20   SpO2 98%   Physical Exam Vitals and nursing note reviewed.  Constitutional:      General: She is not in acute distress.    Appearance: She is well-developed and well-nourished.  HENT:     Head: Normocephalic and atraumatic.  Eyes:  Extraocular Movements: Extraocular movements intact.     Conjunctiva/sclera: Conjunctivae normal.     Pupils: Pupils are equal, round, and reactive to light.  Cardiovascular:     Rate and Rhythm: Normal rate and regular rhythm.     Heart sounds: No murmur heard.   Pulmonary:     Effort: Pulmonary effort is normal. No respiratory distress.     Breath sounds: Normal breath sounds.  Abdominal:     General: Bowel sounds are normal.     Palpations: Abdomen is soft.     Tenderness: There is no abdominal tenderness. There is no guarding or rebound.  Musculoskeletal:        General: No edema.     Cervical back: Neck supple.     Comments: 4 cm deep laceration to the right anterior shin.  Skin tear noted to the left knee and left elbow.  Skin:    General: Skin is warm and dry.  Neurological:     Mental Status: She is alert.     Comments: Alert, demented, no facial droop.  Moving all extremities.  Psychiatric:        Mood and Affect: Mood and affect normal.     ED Results / Procedures / Treatments   Labs (all labs ordered are listed, but only abnormal results are displayed) Labs Reviewed - No data to display  EKG None  Radiology DG Elbow Complete Left  Result Date: 10/17/2020 CLINICAL DATA:  Pain EXAM: LEFT ELBOW - COMPLETE 3+ VIEW COMPARISON:  None. FINDINGS: There is no evidence of fracture, dislocation, or joint effusion. There is no evidence of arthropathy or other focal bone abnormality. Soft tissues are unremarkable. IMPRESSION: Negative. Electronically  Signed   By: Katherine Mantle M.D.   On: 10/17/2020 18:19   DG Tibia/Fibula Right  Result Date: 10/17/2020 CLINICAL DATA:  Pain status post fall EXAM: RIGHT TIBIA AND FIBULA - 2 VIEW COMPARISON:  None. FINDINGS: There is a laceration involving the lateral aspect of the right lower extremity with surrounding soft tissue swelling. There is no radiopaque foreign body. There is no underlying acute displaced fracture or dislocation. Patient is status post prior total knee arthroplasty. Calcifications are noted of the plantar fascia. IMPRESSION: Laceration of the right lower extremity without evidence for an acute displaced fracture or radiopaque foreign body. Electronically Signed   By: Katherine Mantle M.D.   On: 10/17/2020 18:15   CT Head Wo Contrast  Result Date: 10/17/2020 CLINICAL DATA:  Tripped and fell, head trauma, history Alzheimer's, hypertension EXAM: CT HEAD WITHOUT CONTRAST CT CERVICAL SPINE WITHOUT CONTRAST TECHNIQUE: Multidetector CT imaging of the head and cervical spine was performed following the standard protocol without intravenous contrast. Multiplanar CT image reconstructions of the cervical spine were also generated. COMPARISON:  01/24/2018 FINDINGS: CT HEAD FINDINGS Brain: Generalized atrophy. Normal ventricular morphology. No midline shift or mass effect. Mild small vessel chronic ischemic changes of deep cerebral white matter. No intracranial hemorrhage, mass lesion, or evidence of acute infarction. No extra-axial fluid collections. Vascular: No hyperdense vessels. Mild atherosclerotic calcification of internal carotid arteries at skull base Skull: Intact Sinuses/Orbits: Chronic opacification of sphenoid sinus. Remaining paranasal sinuses clear. Other: N/A CT CERVICAL SPINE FINDINGS Alignment: Normal Skull base and vertebrae: Osseous demineralization. Skull base intact. Diffuse disc space narrowing throughout cervical spine. Vertebral body heights maintained. No fracture,  subluxation, or bone destruction. Exam degraded by motion artifacts. Scattered mild facet degenerative changes. Soft tissues and spinal canal: Prevertebral soft tissues normal thickness. Atherosclerotic  calcifications at the carotid bifurcations. Disc levels:  No specific abnormalities Upper chest: Lung apices clear Other: N/A IMPRESSION: Atrophy with small vessel chronic ischemic changes of deep cerebral white matter. No acute intracranial abnormalities. Multilevel degenerative disc and facet disease changes of the cervical spine. No acute cervical spine abnormalities identified on exam mildly limited by motion. Electronically Signed   By: Ulyses Southward M.D.   On: 10/17/2020 18:28   CT Cervical Spine Wo Contrast  Result Date: 10/17/2020 CLINICAL DATA:  Tripped and fell, head trauma, history Alzheimer's, hypertension EXAM: CT HEAD WITHOUT CONTRAST CT CERVICAL SPINE WITHOUT CONTRAST TECHNIQUE: Multidetector CT imaging of the head and cervical spine was performed following the standard protocol without intravenous contrast. Multiplanar CT image reconstructions of the cervical spine were also generated. COMPARISON:  01/24/2018 FINDINGS: CT HEAD FINDINGS Brain: Generalized atrophy. Normal ventricular morphology. No midline shift or mass effect. Mild small vessel chronic ischemic changes of deep cerebral white matter. No intracranial hemorrhage, mass lesion, or evidence of acute infarction. No extra-axial fluid collections. Vascular: No hyperdense vessels. Mild atherosclerotic calcification of internal carotid arteries at skull base Skull: Intact Sinuses/Orbits: Chronic opacification of sphenoid sinus. Remaining paranasal sinuses clear. Other: N/A CT CERVICAL SPINE FINDINGS Alignment: Normal Skull base and vertebrae: Osseous demineralization. Skull base intact. Diffuse disc space narrowing throughout cervical spine. Vertebral body heights maintained. No fracture, subluxation, or bone destruction. Exam degraded by  motion artifacts. Scattered mild facet degenerative changes. Soft tissues and spinal canal: Prevertebral soft tissues normal thickness. Atherosclerotic calcifications at the carotid bifurcations. Disc levels:  No specific abnormalities Upper chest: Lung apices clear Other: N/A IMPRESSION: Atrophy with small vessel chronic ischemic changes of deep cerebral white matter. No acute intracranial abnormalities. Multilevel degenerative disc and facet disease changes of the cervical spine. No acute cervical spine abnormalities identified on exam mildly limited by motion. Electronically Signed   By: Ulyses Southward M.D.   On: 10/17/2020 18:28   DG Knee Complete 4 Views Left  Result Date: 10/17/2020 CLINICAL DATA:  Pain after fall EXAM: LEFT KNEE - COMPLETE 4+ VIEW COMPARISON:  None. FINDINGS: Patient is status post left knee replacement. Anterior soft tissue swelling is seen below the knee. No acute fractures are seen. No joint effusion is noted. IMPRESSION: Anterior and lateral soft tissue swelling. No fractures. Patient is status post left knee replacement. Hardware is in good position. Electronically Signed   By: Gerome Sam III M.D   On: 10/17/2020 19:58   DG Hips Bilat W or Wo Pelvis 3-4 Views  Result Date: 10/17/2020 CLINICAL DATA:  Pain EXAM: DG HIP (WITH OR WITHOUT PELVIS) 3-4V BILAT COMPARISON:  None. FINDINGS: There are mild degenerative changes of both hips. There is no acute displaced fracture. No dislocation. IMPRESSION: Mild degenerative changes of both hips. Electronically Signed   By: Katherine Mantle M.D.   On: 10/17/2020 18:17    Procedures .Marland KitchenLaceration Repair  Date/Time: 10/17/2020 9:10 PM Performed by: Karrie Meres, PA-C Authorized by: Karrie Meres, PA-C   Consent:    Consent obtained:  Verbal   Consent given by:  Guardian   Risks, benefits, and alternatives were discussed: yes     Risks discussed:  Infection, pain, need for additional repair and poor cosmetic result    Alternatives discussed:  No treatment Universal protocol:    Procedure explained and questions answered to patient or proxy's satisfaction: yes     Relevant documents present and verified: yes     Imaging studies available: yes  Site/side marked: yes   Anesthesia:    Anesthesia method:  Topical application and local infiltration   Topical anesthetic:  LET   Local anesthetic:  Lidocaine 2% w/o epi Laceration details:    Location:  Leg   Leg location:  R lower leg   Length (cm):  4 Pre-procedure details:    Preparation:  Patient was prepped and draped in usual sterile fashion and imaging obtained to evaluate for foreign bodies Exploration:    Limited defect created (wound extended): yes     Hemostasis achieved with:  Direct pressure and LET   Imaging obtained: x-ray     Imaging outcome: foreign body not noted     Wound exploration: wound explored through full range of motion and entire depth of wound visualized     Contaminated: no   Treatment:    Area cleansed with:  Saline   Amount of cleaning:  Extensive   Irrigation solution:  Sterile saline   Irrigation volume:  1L   Irrigation method:  Pressure wash   Visualized foreign bodies/material removed: yes     Layers repaired: fatty layer was repaired beneath, there is an overlying skin tear and steri strips were placed over this wound. Skin repair:    Repair method:  Sutures   Suture size:  3-0   Wound skin closure material used: vicryl rapide.   Suture technique:  Simple interrupted   Number of sutures:  6 Approximation:    Approximation:  Loose Repair type:    Repair type:  Simple Post-procedure details:    Dressing:  Non-adherent dressing and tube gauze   Procedure completion:  Tolerated     Medications Ordered in ED Medications  Tdap (BOOSTRIX) injection 0.5 mL (0.5 mLs Intramuscular Given 10/17/20 1745)  lidocaine (XYLOCAINE) 2 % (with pres) injection 200 mg (200 mg Intradermal Given by Other 10/17/20 1744)   lidocaine-EPINEPHrine-tetracaine (LET) topical gel (3 mLs Topical Given 10/17/20 1745)  fentaNYL (SUBLIMAZE) injection 12.5 mcg (12.5 mcg Intramuscular Given 10/17/20 2119)    ED Course  I have reviewed the triage vital signs and the nursing notes.  Pertinent labs & imaging results that were available during my care of the patient were reviewed by me and considered in my medical decision making (see chart for details).    MDM Rules/Calculators/A&P                          87 year old female presented emergency department today for evaluation of a fall.  Fall sounds like it was mechanical in nature and was witnessed by staff.  She does have a laceration to the right shin area.  She also has a skin tear to the left knee.  CT head/cervical spine did not show any acute traumatic injury.  X-ray of the hips negative for acute fracture.  X-ray of the right tib-fib negative for acute fracture or traumatic injury.  X-ray of the left elbow and left knee negative for acute traumatic fracture or dislocation.  Pressure irrigation performed. Wound explored and base of wound visualized in a bloodless field without evidence of foreign body.  Laceration occurred < 8 hours prior to repair which was well tolerated.  Tdap updated.  Given depth of the wound and concern for a possible infection patient was given Rx for antibiotics for home.  Discussed suture home care with patient and answered questions.  Sutures are absorbable therefore will not need to be removed.  They are to return  to the ED sooner for signs of infection. Pt is hemodynamically stable with no complaints prior to dc.    Final Clinical Impression(s) / ED Diagnoses Final diagnoses:  Fall, initial encounter  Laceration of right lower extremity, initial encounter    Rx / DC Orders ED Discharge Orders         Ordered    cephALEXin (KEFLEX) 500 MG capsule  4 times daily        10/17/20 2022           Rayne Du 10/17/20  2216    Tilden Fossa, MD 10/19/20 1517

## 2020-10-18 ENCOUNTER — Encounter: Payer: Self-pay | Admitting: Nurse Practitioner

## 2020-10-18 ENCOUNTER — Non-Acute Institutional Stay (SKILLED_NURSING_FACILITY): Payer: Medicare PPO | Admitting: Nurse Practitioner

## 2020-10-18 DIAGNOSIS — M353 Polymyalgia rheumatica: Secondary | ICD-10-CM

## 2020-10-18 DIAGNOSIS — K5901 Slow transit constipation: Secondary | ICD-10-CM

## 2020-10-18 DIAGNOSIS — T07XXXA Unspecified multiple injuries, initial encounter: Secondary | ICD-10-CM | POA: Insufficient documentation

## 2020-10-18 DIAGNOSIS — F339 Major depressive disorder, recurrent, unspecified: Secondary | ICD-10-CM | POA: Diagnosis not present

## 2020-10-18 DIAGNOSIS — I10 Essential (primary) hypertension: Secondary | ICD-10-CM

## 2020-10-18 NOTE — Assessment & Plan Note (Signed)
stable, on Prednisone 3mg  qd.Prn Tylenol.

## 2020-10-18 NOTE — Assessment & Plan Note (Signed)
s/p ED eval 10/17/20 for fall, CT head/cervical spine, X-ray L elbow, hips, pelvis, L knee, R tibia/fibula r/o acute process. Left elbow, L Knee, RLE covered in dressing during my examination, no reduced ROM, no grimace noted with ROM.  Update CBC/diff, CMP/eGFR

## 2020-10-18 NOTE — Assessment & Plan Note (Signed)
blood pressure is controlled, on Atenolol 25mg  qd.

## 2020-10-18 NOTE — Progress Notes (Signed)
Location:    [facility] Room Number: 110 Place of Service:  SNF (31) Provider: Marlana Latus NP  Arslan Kier X, NP  Patient Care Team: Musa Rewerts X, NP as PCP - General (Internal Medicine) Hennie Duos, MD (Rheumatology) Clent Jacks, MD (Ophthalmology) Allyn Kenner, MD (Dermatology) Ardis Hughs, MD as Attending Physician (Urology) Rhylie Stehr X, NP as Nurse Practitioner (Internal Medicine)  Extended Emergency Contact Information Primary Emergency Contact: McClanahan,Susan Address: [place], [place] [address] [place]te [place] of [place] Phone: [phone] Mobile Phone: [phone] Relation: Daughter Secondary Emergency Contact: Newman Nip [place] of [place] Phone: [phone] Relation: Daughter  Code Status: DNR Goals of care: Advanced Directive information Advanced Directives 10/19/2020  Does Patient Have a Medical Advance Directive? Yes  Type of Advance Directive Out of facility DNR (pink MOST or yellow form)  Does patient want to make changes to medical advance directive? -  Copy of [place] in Chart? -  Would patient like information on creating a medical advance directive? -  Pre-existing out of facility DNR order (yellow form or pink MOST form) Yellow form placed in chart (order not valid for inpatient use)     Chief Complaint  Patient presents with  . Acute Visit    F/u ED, s/p fall    HPI:  Pt is a 87 y.o. female seen today for an acute visit for s/p ED eval 10/17/20 for fall, CT head/cervical spine, X-ray L elbow, hips, pelvis, L knee, R tibia/fibula r/o acute process. Left elbow, L Knee, RLE covered in dressing during my examination, no reduced ROM, no grimace noted with ROM.   Depression/anxiety, takesSertraline 127m qd,TSH 3.24 07/07/20 HTN, blood pressure is controlled, on Atenolol 269mqd.  PMR, stable, on Prednisone 70m66mqd.Prn Tylenol. Constipation, takes Colace, Miralax.    Past Medical History:  Diagnosis Date  . Acute bronchitis 06/27/2015  . Alzheimer's dementia ([facility] . Cataract   . Cellulitis of left leg   . Depression   . Eczema   . High cholesterol   . History of renal stone    excision  . Hypertension   . Mild cognitive impairment   . Osteoarthritis   . Polymyalgia rheumatica (HCC)    rx with prednisone  . Urinary incontinence   . Varicose veins   . Xerosis of skin    Past Surgical History:  Procedure Laterality Date  . cataract surgery    . EYE SURGERY    . JOINT REPLACEMENT Bilateral    Dr. FraHector Shade kidney stone surgey    . lipoma removal      Allergies  Allergen Reactions  . Donepezil Other (See Comments)    BAD DREAMS  . Namenda [Memantine]   . Remeron [Mirtazapine] Other (See Comments)    Dizzy     Allergies as of 10/18/2020      Reactions   Donepezil Other (See Comments)   BAD DREAMS   Namenda [memantine]    Remeron [mirtazapine] Other (See Comments)   Dizzy       Medication List       Accurate as of October 18, 2020 11:59 PM. If you have any questions, ask your nurse or doctor.        STOP taking these medications   Fish Oil 1200 MG Caps Stopped by: Tyrik Stetzer X Rivka Baune, NP   THERATEARS OP  Stopped by: Brownie Nehme X Jamesa Tedrick, NP     TAKE these medications   acetaminophen 500 MG tablet Commonly known as: TYLENOL Take 1,000 mg by mouth every 8 (eight) hours as needed.   atenolol 25 MG tablet Commonly known as: TENORMIN Take 25 mg by mouth daily.   calcium carbonate 1500 (600 Ca) MG Tabs tablet Commonly known as: OSCAL Take 600 mg of elemental calcium by mouth 2 (two) times daily with a meal.   cephALEXin 500 MG capsule Commonly known as: KEFLEX Take 1 capsule (500 mg total) by mouth 4 (four) times daily for 7 days.   cholecalciferol 25 MCG (1000 UNIT) tablet Commonly known as: VITAMIN D Take 1,000 Units by mouth daily.    docusate sodium 100 MG capsule Commonly known as: COLACE Take 100 mg by mouth 2 (two) times daily.   hydrocortisone 2.5 % rectal cream Commonly known as: ANUSOL-HC Place 1 application rectally 2 (two) times daily.   LORazepam 0.5 MG tablet Commonly known as: ATIVAN Take 0.25 mg by mouth daily as needed for anxiety.   NON FORMULARY Diet Regular Special Instructions: Give prune juice daily with breakfast   OCUSOFT BABY EYELID & EYELASH EX Apply topically. 1 PAD BOTH EYES; CLEAN THE LIDS 2 TIMES A DAY   polyethylene glycol 17 g packet Commonly known as: MIRALAX / GLYCOLAX Take 17 g by mouth daily.   predniSONE 1 MG tablet Commonly known as: DELTASONE Take 3 mg by mouth daily.   sertraline 100 MG tablet Commonly known as: ZOLOFT Take 100 mg by mouth daily. Take with 25 mg = 125 mg.   sertraline 25 MG tablet Commonly known as: ZOLOFT Take 25 mg by mouth daily. 100 mg +25 mg= 125 mg   therapeutic multivitamin-minerals tablet Take 1 tablet by mouth daily. 400 mcg       Review of Systems  Constitutional: Negative for activity change, fever and unexpected weight change.  HENT: Positive for hearing loss. Negative for congestion and voice change.   Eyes: Negative for visual disturbance.  Respiratory: Negative for shortness of breath.   Cardiovascular: Negative for leg swelling.  Gastrointestinal: Negative for abdominal pain and constipation.  Genitourinary: Negative for dysuria, frequency and urgency.  Musculoskeletal: Positive for arthralgias, gait problem and myalgias.  Skin: Positive for wound. Negative for color change.  Neurological: Negative for speech difficulty, weakness and headaches.       Dementia  Psychiatric/Behavioral: Positive for confusion. Negative for agitation, behavioral problems and sleep disturbance. The patient is not nervous/anxious.     Immunization History  Administered Date(s) Administered  . Influenza Whole 05/28/2012, 06/01/2018  .  Influenza, High Dose Seasonal PF 06/08/2016  . Influenza-Unspecified 05/28/2014, 08/28/2014, 05/27/2015, 06/18/2017, 06/10/2020  . Moderna Sars-Covid-2 Vaccination 08/30/2019, 09/27/2019, 07/06/2020  . Pneumococcal Conjugate-13 01/21/2014  . Pneumococcal Polysaccharide-23 04/16/2015  . Td 09/29/2003  . Tdap 10/17/2020  . Zoster 01/14/2011   Pertinent  Health Maintenance Due  Topic Date Due  . INFLUENZA VACCINE  Completed  . DEXA SCAN  Completed  . PNA vac Low Risk Adult  Completed   Fall Risk  05/17/2018 05/10/2017 05/01/2017 07/17/2016 06/09/2015  Falls in the past year? No No No Yes No  Comment - - - tripped over the last stair in the house -  Number falls in past yr: - - - 1 -   Functional Status Survey:    Vitals:   10/18/20 1557  BP: (!) 142/76  Pulse: 78  Resp: (!) 22  Temp: 98.8  F (37.1 C)  SpO2: 93%  Weight: 177 lb 9.6 oz (80.6 kg)  Height: _0  (1.676 m)   Body mass index is 28.67 kg/m. Physical Exam Vitals and nursing note reviewed.  Constitutional:      Appearance: Normal appearance.  HENT:     Head: Normocephalic and atraumatic.     Mouth/Throat:     Mouth: Mucous membranes are moist.  Eyes:     Extraocular Movements: Extraocular movements intact.     Conjunctiva/sclera: Conjunctivae normal.     Pupils: Pupils are equal, round, and reactive to light.  Cardiovascular:     Rate and Rhythm: Normal rate. Rhythm irregular.     Heart sounds: No murmur heard.   Pulmonary:     Breath sounds: Rhonchi present. No rales.  Abdominal:     General: Bowel sounds are normal.     Palpations: Abdomen is soft.     Tenderness: There is no abdominal tenderness.  Musculoskeletal:     Cervical back: Normal range of motion and neck supple.     Right lower leg: No edema.     Left lower leg: No edema.  Skin:    General: Skin is warm and dry.     Findings: Bruising present.     Comments: Skin lacerations are covered in dressing L elbow, L knee, R lower leg. Staff  reported the wounds are well approximated with steri strips, no active bleeding or s/s of infection.   Neurological:     General: No focal deficit present.     Mental Status: She is alert. Mental status is at baseline.     Gait: Gait abnormal.     Comments: Oriented to self. Ambulates with walker.   Psychiatric:     Comments: Confused. Smiled. Followed directions.      Labs reviewed: Recent Labs    11/25/19 0000 06/30/20 0000 07/07/20 0000 08/26/20 0000  NA 140 141  142 141 140  K 3.8 3.3*  3.7 3.3* 3.8  CL 104 102  105 102 105  CO2 29* 30*  26* 30* 29*  BUN 20 22*  22* 22* 24*  CREATININE 0.7 0.7  0.7 0.7  --   CALCIUM 9.4 9.7  9.5 9.7 8.9   Recent Labs    11/25/19 0000 06/30/20 0000 07/07/20 0000  AST _1 ALT _2 ALKPHOS 68 82  70 82  ALBUMIN 3.6 4.2  3.7 4.2   Recent Labs    11/25/19 0000 06/30/20 0000 07/07/20 0000  WBC 8.1 7.6  8.4 7.6  NEUTROABS 4,163 3,838.00  4,662.00 3,838.00  HGB 13.8 15.1  14.1 15.1  HCT 42 46  42 46  PLT 224 275  228 275   Lab Results  Component Value Date   TSH 3.24 07/07/2020   Lab Results  Component Value Date   HGBA1C 5.3 04/26/2017   Lab Results  Component Value Date   CHOL 217 (A) 04/09/2018   HDL 51 04/09/2018   LDLCALC 147 04/09/2018   LDLDIRECT 173.0 08/07/2012   TRIG 86 04/09/2018   CHOLHDL 5 03/20/2016    Significant Diagnostic Results in last 30 days:  DG Elbow Complete Left  Result Date: 10/17/2020 CLINICAL DATA:  Pain EXAM: LEFT ELBOW - COMPLETE 3+ VIEW COMPARISON:  None. FINDINGS: There is no evidence of fracture, dislocation, or joint effusion. There is no evidence of arthropathy or other focal bone abnormality. Soft tissues are unremarkable. IMPRESSION: Negative.  Electronically Signed   By: Constance Holster M.D.   On: 10/17/2020 18:19   DG Tibia/Fibula Right  Result Date: 10/17/2020 CLINICAL DATA:  Pain status post fall EXAM: RIGHT TIBIA AND FIBULA - 2 VIEW  COMPARISON:  None. FINDINGS: There is a laceration involving the lateral aspect of the right lower extremity with surrounding soft tissue swelling. There is no radiopaque foreign body. There is no underlying acute displaced fracture or dislocation. Patient is status post prior total knee arthroplasty. Calcifications are noted of the plantar fascia. IMPRESSION: Laceration of the right lower extremity without evidence for an acute displaced fracture or radiopaque foreign body. Electronically Signed   By: Constance Holster M.D.   On: 10/17/2020 18:15   CT Head Wo Contrast  Result Date: 10/17/2020 CLINICAL DATA:  Tripped and fell, head trauma, history Alzheimer's, hypertension EXAM: CT HEAD WITHOUT CONTRAST CT CERVICAL SPINE WITHOUT CONTRAST TECHNIQUE: Multidetector CT imaging of the head and cervical spine was performed following the standard protocol without intravenous contrast. Multiplanar CT image reconstructions of the cervical spine were also generated. COMPARISON:  01/24/2018 FINDINGS: CT HEAD FINDINGS Brain: Generalized atrophy. Normal ventricular morphology. No midline shift or mass effect. Mild small vessel chronic ischemic changes of deep cerebral white matter. No intracranial hemorrhage, mass lesion, or evidence of acute infarction. No extra-axial fluid collections. Vascular: No hyperdense vessels. Mild atherosclerotic calcification of internal carotid arteries at skull base Skull: Intact Sinuses/Orbits: Chronic opacification of sphenoid sinus. Remaining paranasal sinuses clear. Other: N/A CT CERVICAL SPINE FINDINGS Alignment: Normal Skull base and vertebrae: Osseous demineralization. Skull base intact. Diffuse disc space narrowing throughout cervical spine. Vertebral body heights maintained. No fracture, subluxation, or bone destruction. Exam degraded by motion artifacts. Scattered mild facet degenerative changes. Soft tissues and spinal canal: Prevertebral soft tissues normal thickness.  Atherosclerotic calcifications at the carotid bifurcations. Disc levels:  No specific abnormalities Upper chest: Lung apices clear Other: N/A IMPRESSION: Atrophy with small vessel chronic ischemic changes of deep cerebral white matter. No acute intracranial abnormalities. Multilevel degenerative disc and facet disease changes of the cervical spine. No acute cervical spine abnormalities identified on exam mildly limited by motion. Electronically Signed   By: Lavonia Dana M.D.   On: 10/17/2020 18:28   CT Cervical Spine Wo Contrast  Result Date: 10/17/2020 CLINICAL DATA:  Tripped and fell, head trauma, history Alzheimer's, hypertension EXAM: CT HEAD WITHOUT CONTRAST CT CERVICAL SPINE WITHOUT CONTRAST TECHNIQUE: Multidetector CT imaging of the head and cervical spine was performed following the standard protocol without intravenous contrast. Multiplanar CT image reconstructions of the cervical spine were also generated. COMPARISON:  01/24/2018 FINDINGS: CT HEAD FINDINGS Brain: Generalized atrophy. Normal ventricular morphology. No midline shift or mass effect. Mild small vessel chronic ischemic changes of deep cerebral white matter. No intracranial hemorrhage, mass lesion, or evidence of acute infarction. No extra-axial fluid collections. Vascular: No hyperdense vessels. Mild atherosclerotic calcification of internal carotid arteries at skull base Skull: Intact Sinuses/Orbits: Chronic opacification of sphenoid sinus. Remaining paranasal sinuses clear. Other: N/A CT CERVICAL SPINE FINDINGS Alignment: Normal Skull base and vertebrae: Osseous demineralization. Skull base intact. Diffuse disc space narrowing throughout cervical spine. Vertebral body heights maintained. No fracture, subluxation, or bone destruction. Exam degraded by motion artifacts. Scattered mild facet degenerative changes. Soft tissues and spinal canal: Prevertebral soft tissues normal thickness. Atherosclerotic calcifications at the carotid  bifurcations. Disc levels:  No specific abnormalities Upper chest: Lung apices clear Other: N/A IMPRESSION: Atrophy with small vessel chronic ischemic changes of deep cerebral white  matter. No acute intracranial abnormalities. Multilevel degenerative disc and facet disease changes of the cervical spine. No acute cervical spine abnormalities identified on exam mildly limited by motion. Electronically Signed   By: Lavonia Dana M.D.   On: 10/17/2020 18:28   DG Knee Complete 4 Views Left  Result Date: 10/17/2020 CLINICAL DATA:  Pain after fall EXAM: LEFT KNEE - COMPLETE 4+ VIEW COMPARISON:  None. FINDINGS: Patient is status post left knee replacement. Anterior soft tissue swelling is seen below the knee. No acute fractures are seen. No joint effusion is noted. IMPRESSION: Anterior and lateral soft tissue swelling. No fractures. Patient is status post left knee replacement. Hardware is in good position. Electronically Signed   By: Dorise Bullion III M.D   On: 10/17/2020 19:58   DG Hips Bilat W or Wo Pelvis 3-4 Views  Result Date: 10/17/2020 CLINICAL DATA:  Pain EXAM: DG HIP (WITH OR WITHOUT PELVIS) 3-4V BILAT COMPARISON:  None. FINDINGS: There are mild degenerative changes of both hips. There is no acute displaced fracture. No dislocation. IMPRESSION: Mild degenerative changes of both hips. Electronically Signed   By: Constance Holster M.D.   On: 10/17/2020 18:17    Assessment/Plan: Laceration of multiple sites of skin s/p ED eval 10/17/20 for fall, CT head/cervical spine, X-ray L elbow, hips, pelvis, L knee, R tibia/fibula r/o acute process. Left elbow, L Knee, RLE covered in dressing during my examination, no reduced ROM, no grimace noted with ROM.  Update CBC/diff, CMP/eGFR  Depression, recurrent ([place]) takesSertraline 166m qd,TSH 3.24 07/07/20   Essential hypertension, benign blood pressure is controlled, on Atenolol 261mqd.   Polymyalgia rheumatica (HCC) stable, on Prednisone 86m47md.Prn  Tylenol.   Slow transit constipation takes Colace, Miralax.    Family/ staff Communication: plan of care reviewed with the patient and charge nurse.   Labs/tests ordered: CBC/diff, CMP/eGFR   Time spend 35 minutes.

## 2020-10-18 NOTE — Assessment & Plan Note (Signed)
takesSertraline 125mg  qd,TSH 3.24 07/07/20

## 2020-10-18 NOTE — Assessment & Plan Note (Signed)
takes Colace, Miralax.

## 2020-10-19 ENCOUNTER — Encounter: Payer: Self-pay | Admitting: Internal Medicine

## 2020-10-19 ENCOUNTER — Encounter: Payer: Self-pay | Admitting: Nurse Practitioner

## 2020-10-19 DIAGNOSIS — I10 Essential (primary) hypertension: Secondary | ICD-10-CM | POA: Diagnosis not present

## 2020-10-19 LAB — COMPREHENSIVE METABOLIC PANEL
Albumin: 3.5 (ref 3.5–5.0)
Calcium: 9.5 (ref 8.7–10.7)
Globulin: 2.1

## 2020-10-19 LAB — HEPATIC FUNCTION PANEL
ALT: 16 (ref 7–35)
AST: 18 (ref 13–35)
Alkaline Phosphatase: 72 (ref 25–125)
Bilirubin, Total: 0.6

## 2020-10-19 LAB — CBC AND DIFFERENTIAL
HCT: 41 (ref 36–46)
Hemoglobin: 13.7 (ref 12.0–16.0)
Neutrophils Absolute: 5212
Platelets: 246 (ref 150–399)
WBC: 8.6

## 2020-10-19 LAB — BASIC METABOLIC PANEL
BUN: 20 (ref 4–21)
CO2: 29 — AB (ref 13–22)
Chloride: 106 (ref 99–108)
Creatinine: 0.6 (ref 0.5–1.1)
Glucose: 89
Potassium: 3.7 (ref 3.4–5.3)
Sodium: 143 (ref 137–147)

## 2020-10-19 LAB — CBC: RBC: 4.68 (ref 3.87–5.11)

## 2020-10-19 NOTE — Progress Notes (Addendum)
This encounter was created in error - please disregard.

## 2020-10-26 ENCOUNTER — Non-Acute Institutional Stay (SKILLED_NURSING_FACILITY): Payer: Medicare PPO | Admitting: Internal Medicine

## 2020-10-26 ENCOUNTER — Encounter: Payer: Self-pay | Admitting: Internal Medicine

## 2020-10-26 DIAGNOSIS — M353 Polymyalgia rheumatica: Secondary | ICD-10-CM | POA: Diagnosis not present

## 2020-10-26 DIAGNOSIS — F339 Major depressive disorder, recurrent, unspecified: Secondary | ICD-10-CM | POA: Diagnosis not present

## 2020-10-26 DIAGNOSIS — G301 Alzheimer's disease with late onset: Secondary | ICD-10-CM

## 2020-10-26 DIAGNOSIS — I10 Essential (primary) hypertension: Secondary | ICD-10-CM | POA: Diagnosis not present

## 2020-10-26 DIAGNOSIS — F0280 Dementia in other diseases classified elsewhere without behavioral disturbance: Secondary | ICD-10-CM

## 2020-10-26 DIAGNOSIS — T07XXXA Unspecified multiple injuries, initial encounter: Secondary | ICD-10-CM | POA: Diagnosis not present

## 2020-10-26 NOTE — Progress Notes (Signed)
Location:   [facility] Room Number: 110 Place of Service:  SNF (31) Provider:  Einar Crow MD  Mast, Man X, NP  Patient Care Team: Mast, Man X, NP as PCP - General (Internal Medicine) Donnetta Hail, MD (Rheumatology) Ernesto Rutherford, MD (Ophthalmology) Nita Sells, MD (Dermatology) Crist Fat, MD as Attending Physician (Urology) Mast, Man X, NP as Nurse Practitioner (Internal Medicine)  Extended Emergency Contact Information Primary Emergency Contact: McClanahan,Susan Address: [address] [place], [place] [address] [place] Home Phone: [phone] Mobile Phone: [phone] Relation: Daughter Secondary Emergency Contact: Gardiner Sleeper [place] Home Phone: [phone] Relation: Daughter  Code Status:  DNR Managed Care Goals of care: Advanced Directive information Advanced Directives 10/19/2020  Does Patient Have a Medical Advance Directive? Yes  Type of Advance Directive Out of facility DNR (pink MOST or yellow form)  Does patient want to make changes to medical advance directive? -  Copy of Healthcare Power of Attorney in Chart? -  Would patient like information on creating a medical advance directive? -  Pre-existing out of facility DNR order (yellow form or pink MOST form) Yellow form placed in chart (order not valid for inpatient use)     Chief Complaint  Patient presents with  . Medical Management of Chronic Issues    HPI:  Pt is a 87 y.o. female seen today for medical management of chronic diseases.    Patient has h/o Frontal temporal Dementia with aphasia, Hypertension,  PMR on Chronic Prednisone,  Anxiety with Depression Now is in Memory unit due to her behavior issues including Elopement from the facility.  Doing well. Does have Multiple Skin tears on her Right Leg. Frail Skin due to Prednisone Weight stays stable Unable to give much history due to her  dementia Gets frustrated easily No New issues   Past Medical History:  Diagnosis Date  . Acute bronchitis 06/27/2015  . Alzheimer's dementia (HCC)   . Cataract   . Cellulitis of left leg   . Depression   . Eczema   . High cholesterol   . History of renal stone    excision  . Hypertension   . Mild cognitive impairment   . Osteoarthritis   . Polymyalgia rheumatica (HCC)    rx with prednisone  . Urinary incontinence   . Varicose veins   . Xerosis of skin    Past Surgical History:  Procedure Laterality Date  . cataract surgery    . EYE SURGERY    . JOINT REPLACEMENT Bilateral    Dr. Trudee Grip  . kidney stone surgey    . lipoma removal      Allergies  Allergen Reactions  . Donepezil Other (See Comments)    BAD DREAMS  . Namenda [Memantine]   . Remeron [Mirtazapine] Other (See Comments)    Dizzy     Allergies as of 10/26/2020      Reactions   Donepezil Other (See Comments)   BAD DREAMS   Namenda [memantine]    Remeron [mirtazapine] Other (See Comments)   Dizzy       Medication List       Accurate as of October 26, 2020  1:07 PM. If you have any questions, ask your nurse or doctor.        acetaminophen 500 MG tablet Commonly known as: TYLENOL Take 1,000 mg by mouth every 8 (eight) hours as needed.  atenolol 25 MG tablet Commonly known as: TENORMIN Take 25 mg by mouth daily.   calcium carbonate 1500 (600 Ca) MG Tabs tablet Commonly known as: OSCAL Take 600 mg of elemental calcium by mouth 2 (two) times daily with a meal.   cholecalciferol 25 MCG (1000 UNIT) tablet Commonly known as: VITAMIN D Take 1,000 Units by mouth daily.   docusate sodium 100 MG capsule Commonly known as: COLACE Take 100 mg by mouth 2 (two) times daily.   hydrocortisone 2.5 % rectal cream Commonly known as: ANUSOL-HC Place 1 application rectally 2 (two) times daily.   LORazepam 0.5 MG tablet Commonly known as: ATIVAN Take 0.25 mg by mouth daily as needed for anxiety.    NON FORMULARY Diet Regular Special Instructions: Give prune juice daily with breakfast   OCUSOFT BABY EYELID & EYELASH EX Apply topically. 1 PAD BOTH EYES; CLEAN THE LIDS 2 TIMES A DAY   polyethylene glycol 17 g packet Commonly known as: MIRALAX / GLYCOLAX Take 17 g by mouth daily.   predniSONE 1 MG tablet Commonly known as: DELTASONE Take 3 mg by mouth daily.   sertraline 100 MG tablet Commonly known as: ZOLOFT Take 100 mg by mouth daily. Take with 25 mg = 125 mg.   sertraline 25 MG tablet Commonly known as: ZOLOFT Take 25 mg by mouth daily. 100 mg +25 mg= 125 mg   therapeutic multivitamin-minerals tablet Take 1 tablet by mouth daily. 400 mcg       Review of Systems  Unable to perform ROS: Dementia    Immunization History  Administered Date(s) Administered  . Influenza Whole 05/28/2012, 06/01/2018  . Influenza, High Dose Seasonal PF 06/08/2016  . Influenza-Unspecified 05/28/2014, 08/28/2014, 05/27/2015, 06/18/2017, 06/10/2020  . Moderna Sars-Covid-2 Vaccination 08/30/2019, 09/27/2019, 07/06/2020  . Pneumococcal Conjugate-13 01/21/2014  . Pneumococcal Polysaccharide-23 04/16/2015  . Td 09/29/2003  . Tdap 10/17/2020  . Zoster 01/14/2011   Pertinent  Health Maintenance Due  Topic Date Due  . INFLUENZA VACCINE  Completed  . DEXA SCAN  Completed  . PNA vac Low Risk Adult  Completed   Fall Risk  05/17/2018 05/10/2017 05/01/2017 07/17/2016 06/09/2015  Falls in the past year? No No No Yes No  Comment - - - tripped over the last stair in the house -  Number falls in past yr: - - - 1 -   Functional Status Survey:    Vitals:   10/26/20 1300  BP: 132/70  Pulse: 74  Resp: (!) 22  Temp: 98.1 F (36.7 C)  SpO2: 94%  Weight: 176 lb 12.8 oz (80.2 kg)  Height: 5\' 6"  (1.676 m)   Body mass index is 28.54 kg/m. Physical Exam Vitals reviewed.  Constitutional:      Appearance: Normal appearance.  HENT:     Head: Normocephalic.     Nose: Nose normal.      Mouth/Throat:     Mouth: Mucous membranes are moist.     Pharynx: Oropharynx is clear.  Eyes:     Pupils: Pupils are equal, round, and reactive to light.  Cardiovascular:     Rate and Rhythm: Normal rate and regular rhythm.     Pulses: Normal pulses.  Pulmonary:     Effort: Pulmonary effort is normal.     Breath sounds: Normal breath sounds.  Abdominal:     General: Abdomen is flat. Bowel sounds are normal.     Palpations: Abdomen is soft.  Musculoskeletal:     Cervical back: Neck supple.  Comments: Mild edema   Skin:    Comments: Has Skin Tear in her Right Lower leg No Redness or discharge  Neurological:     General: No focal deficit present.     Mental Status: She is alert.     Comments: Oriented to herself. Has Aphasia  Psychiatric:        Mood and Affect: Mood normal.        Thought Content: Thought content normal.     Labs reviewed: Recent Labs    06/30/20 0000 07/07/20 0000 08/26/20 0000 10/19/20 0000  NA 141  142 141 140 143  K 3.3*  3.7 3.3* 3.8 3.7  CL 102  105 102 105 106  CO2 30*  26* 30* 29* 29*  BUN 22*  22* 22* 24* 20  CREATININE 0.7  0.7 0.7  --  0.6  CALCIUM 9.7  9.5 9.7 8.9 9.5   Recent Labs    06/30/20 0000 07/07/20 0000 10/19/20 0000  AST 18  18 18 18   ALT 18  16 18 16   ALKPHOS 82  70 82 72  ALBUMIN 4.2  3.7 4.2 3.5   Recent Labs    06/30/20 0000 07/07/20 0000 10/19/20 0000  WBC 7.6  8.4 7.6 8.6  NEUTROABS 3,838.00  4,662.00 3,838.00 5,212.00  HGB 15.1  14.1 15.1 13.7  HCT 46  42 46 41  PLT 275  228 275 246   Lab Results  Component Value Date   TSH 3.24 07/07/2020   Lab Results  Component Value Date   HGBA1C 5.3 04/26/2017   Lab Results  Component Value Date   CHOL 217 (A) 04/09/2018   HDL 51 04/09/2018   LDLCALC 147 04/09/2018   LDLDIRECT 173.0 08/07/2012   TRIG 86 04/09/2018   CHOLHDL 5 03/20/2016    Significant Diagnostic Results in last 30 days:  DG Elbow Complete Left  Result Date:  10/17/2020 CLINICAL DATA:  Pain EXAM: LEFT ELBOW - COMPLETE 3+ VIEW COMPARISON:  None. FINDINGS: There is no evidence of fracture, dislocation, or joint effusion. There is no evidence of arthropathy or other focal bone abnormality. Soft tissues are unremarkable. IMPRESSION: Negative. Electronically Signed   By: 03/22/2016 M.D.   On: 10/17/2020 18:19   DG Tibia/Fibula Right  Result Date: 10/17/2020 CLINICAL DATA:  Pain status post fall EXAM: RIGHT TIBIA AND FIBULA - 2 VIEW COMPARISON:  None. FINDINGS: There is a laceration involving the lateral aspect of the right lower extremity with surrounding soft tissue swelling. There is no radiopaque foreign body. There is no underlying acute displaced fracture or dislocation. Patient is status post prior total knee arthroplasty. Calcifications are noted of the plantar fascia. IMPRESSION: Laceration of the right lower extremity without evidence for an acute displaced fracture or radiopaque foreign body. Electronically Signed   By: 10/19/2020 M.D.   On: 10/17/2020 18:15   CT Head Wo Contrast  Result Date: 10/17/2020 CLINICAL DATA:  Tripped and fell, head trauma, history Alzheimer's, hypertension EXAM: CT HEAD WITHOUT CONTRAST CT CERVICAL SPINE WITHOUT CONTRAST TECHNIQUE: Multidetector CT imaging of the head and cervical spine was performed following the standard protocol without intravenous contrast. Multiplanar CT image reconstructions of the cervical spine were also generated. COMPARISON:  01/24/2018 FINDINGS: CT HEAD FINDINGS Brain: Generalized atrophy. Normal ventricular morphology. No midline shift or mass effect. Mild small vessel chronic ischemic changes of deep cerebral white matter. No intracranial hemorrhage, mass lesion, or evidence of acute infarction. No extra-axial fluid collections. Vascular: No  hyperdense vessels. Mild atherosclerotic calcification of internal carotid arteries at skull base Skull: Intact Sinuses/Orbits: Chronic  opacification of sphenoid sinus. Remaining paranasal sinuses clear. Other: N/A CT CERVICAL SPINE FINDINGS Alignment: Normal Skull base and vertebrae: Osseous demineralization. Skull base intact. Diffuse disc space narrowing throughout cervical spine. Vertebral body heights maintained. No fracture, subluxation, or bone destruction. Exam degraded by motion artifacts. Scattered mild facet degenerative changes. Soft tissues and spinal canal: Prevertebral soft tissues normal thickness. Atherosclerotic calcifications at the carotid bifurcations. Disc levels:  No specific abnormalities Upper chest: Lung apices clear Other: N/A IMPRESSION: Atrophy with small vessel chronic ischemic changes of deep cerebral white matter. No acute intracranial abnormalities. Multilevel degenerative disc and facet disease changes of the cervical spine. No acute cervical spine abnormalities identified on exam mildly limited by motion. Electronically Signed   By: Ulyses SouthwardMark  Boles M.D.   On: 10/17/2020 18:28   CT Cervical Spine Wo Contrast  Result Date: 10/17/2020 CLINICAL DATA:  Tripped and fell, head trauma, history Alzheimer's, hypertension EXAM: CT HEAD WITHOUT CONTRAST CT CERVICAL SPINE WITHOUT CONTRAST TECHNIQUE: Multidetector CT imaging of the head and cervical spine was performed following the standard protocol without intravenous contrast. Multiplanar CT image reconstructions of the cervical spine were also generated. COMPARISON:  01/24/2018 FINDINGS: CT HEAD FINDINGS Brain: Generalized atrophy. Normal ventricular morphology. No midline shift or mass effect. Mild small vessel chronic ischemic changes of deep cerebral white matter. No intracranial hemorrhage, mass lesion, or evidence of acute infarction. No extra-axial fluid collections. Vascular: No hyperdense vessels. Mild atherosclerotic calcification of internal carotid arteries at skull base Skull: Intact Sinuses/Orbits: Chronic opacification of sphenoid sinus. Remaining paranasal  sinuses clear. Other: N/A CT CERVICAL SPINE FINDINGS Alignment: Normal Skull base and vertebrae: Osseous demineralization. Skull base intact. Diffuse disc space narrowing throughout cervical spine. Vertebral body heights maintained. No fracture, subluxation, or bone destruction. Exam degraded by motion artifacts. Scattered mild facet degenerative changes. Soft tissues and spinal canal: Prevertebral soft tissues normal thickness. Atherosclerotic calcifications at the carotid bifurcations. Disc levels:  No specific abnormalities Upper chest: Lung apices clear Other: N/A IMPRESSION: Atrophy with small vessel chronic ischemic changes of deep cerebral white matter. No acute intracranial abnormalities. Multilevel degenerative disc and facet disease changes of the cervical spine. No acute cervical spine abnormalities identified on exam mildly limited by motion. Electronically Signed   By: Ulyses SouthwardMark  Boles M.D.   On: 10/17/2020 18:28   DG Knee Complete 4 Views Left  Result Date: 10/17/2020 CLINICAL DATA:  Pain after fall EXAM: LEFT KNEE - COMPLETE 4+ VIEW COMPARISON:  None. FINDINGS: Patient is status post left knee replacement. Anterior soft tissue swelling is seen below the knee. No acute fractures are seen. No joint effusion is noted. IMPRESSION: Anterior and lateral soft tissue swelling. No fractures. Patient is status post left knee replacement. Hardware is in good position. Electronically Signed   By: Gerome Samavid  Williams III M.D   On: 10/17/2020 19:58   DG Hips Bilat W or Wo Pelvis 3-4 Views  Result Date: 10/17/2020 CLINICAL DATA:  Pain EXAM: DG HIP (WITH OR WITHOUT PELVIS) 3-4V BILAT COMPARISON:  None. FINDINGS: There are mild degenerative changes of both hips. There is no acute displaced fracture. No dislocation. IMPRESSION: Mild degenerative changes of both hips. Electronically Signed   By: Katherine Mantlehristopher  Green M.D.   On: 10/17/2020 18:17    Assessment/Plan Essential hypertension, benign BP stable on  Tenormin  Depression, recurrent (HCC) Continue on Zoloft Behavior Mangeable No GDR  Laceration of multiple sites  of skin D/w Nurse to use Vaseline or Triple Antibiotic to keep it moist  Polymyalgia rheumatica (HCC) On Low dose of Prednisone Per family Rheumatology wanted him on low dose DEXA scan in 10/18 showed T score -1  Late onset Alzheimer's disease without behavioral disturbance (HCC) Has Not Tolerated Aricept and Namenda Severe Apahsia In Memory unit Supportive care   Family/ staff Communication:   Labs/tests ordered:

## 2020-11-04 ENCOUNTER — Encounter: Payer: Self-pay | Admitting: Nurse Practitioner

## 2020-11-04 ENCOUNTER — Non-Acute Institutional Stay (SKILLED_NURSING_FACILITY): Payer: Medicare PPO | Admitting: Nurse Practitioner

## 2020-11-04 DIAGNOSIS — F0280 Dementia in other diseases classified elsewhere without behavioral disturbance: Secondary | ICD-10-CM | POA: Diagnosis not present

## 2020-11-04 DIAGNOSIS — G301 Alzheimer's disease with late onset: Secondary | ICD-10-CM

## 2020-11-04 DIAGNOSIS — F339 Major depressive disorder, recurrent, unspecified: Secondary | ICD-10-CM

## 2020-11-04 DIAGNOSIS — M353 Polymyalgia rheumatica: Secondary | ICD-10-CM | POA: Diagnosis not present

## 2020-11-04 DIAGNOSIS — K5901 Slow transit constipation: Secondary | ICD-10-CM | POA: Diagnosis not present

## 2020-11-04 DIAGNOSIS — I10 Essential (primary) hypertension: Secondary | ICD-10-CM | POA: Diagnosis not present

## 2020-11-04 DIAGNOSIS — T148XXA Other injury of unspecified body region, initial encounter: Secondary | ICD-10-CM | POA: Diagnosis not present

## 2020-11-04 DIAGNOSIS — L089 Local infection of the skin and subcutaneous tissue, unspecified: Secondary | ICD-10-CM

## 2020-11-04 NOTE — Assessment & Plan Note (Signed)
advanced, limited safety awareness, impulsive, resides memory care unit for supportive care.

## 2020-11-04 NOTE — Progress Notes (Signed)
Location:   SNF [facility] Room Number: 110 Place of Service:  SNF (31) Provider: Arna Snipe Margareta Laureano NP  Maycen Degregory X, NP  Patient Care Team: Melana Hingle X, NP as PCP - General (Internal Medicine) Donnetta Hail, MD (Rheumatology) Ernesto Rutherford, MD (Ophthalmology) Nita Sells, MD (Dermatology) Crist Fat, MD as Attending Physician (Urology) Ari Bernabei X, NP as Nurse Practitioner (Internal Medicine)  Extended Emergency Contact Information Primary Emergency Contact: McClanahan,Susan Address: [address] [place], [place] [address] [place] Home Phone: [phone] Mobile Phone: [phone] Relation: Daughter Secondary Emergency Contact: Gardiner Sleeper [place] of [place] Home Phone: [phone] Relation: Daughter  Code Status: DNR Goals of care: Advanced Directive information Advanced Directives 10/19/2020  Does Patient Have a Medical Advance Directive? Yes  Type of Advance Directive Out of facility DNR (pink MOST or yellow form)  Does patient want to make changes to medical advance directive? -  Copy of Healthcare Power of Attorney in Chart? -  Would patient like information on creating a medical advance directive? -  Pre-existing out of facility DNR order (yellow form or pink MOST form) Yellow form placed in chart (order not valid for inpatient use)     Chief Complaint  Patient presents with  . Acute Visit    Slow healing right lower leg wound    HPI:  Pt is a 87 y.o. female seen today for an acute visit for slow healing abrasion/wound lateral right lower leg sustained from fall 10/17/20, steri strips in place but partially skin miss with wound bed exposure, small amount light yellow drainage on dressing, mild erythema and warmth peri wound, denied pain, no apparent swelling. Left knee abrasion is well approximated with steri strips intact, no drainage seen.      Depression/anxiety, takesSertraline 125mg  qd,TSH 3.24  07/07/20 HTN, blood pressure is controlled, on Atenolol 25mg  qd. Bun/creat 20/0.6 10/19/20 PMR, stable, on Prednisone 3mg  qd.Prn Tylenol.Hgb 13.7 10/19/20 Constipation, takes Colace, Miralax.  Dementia, advanced, limited safety awareness, impulsive, resides memory care unit for supportive care.    Past Medical History:  Diagnosis Date  . Acute bronchitis 06/27/2015  . Alzheimer's dementia (HCC)   . Cataract   . Cellulitis of left leg   . Depression   . Eczema   . High cholesterol   . History of renal stone    excision  . Hypertension   . Mild cognitive impairment   . Osteoarthritis   . Polymyalgia rheumatica (HCC)    rx with prednisone  . Urinary incontinence   . Varicose veins   . Xerosis of skin    Past Surgical History:  Procedure Laterality Date  . cataract surgery    . EYE SURGERY    . JOINT REPLACEMENT Bilateral    Dr.  . kidney stone surgey    . lipoma removal      Allergies  Allergen Reactions  . Donepezil Other (See Comments)    BAD DREAMS  . Namenda [Memantine]   . Remeron [Mirtazapine] Other (See Comments)    Dizzy     Allergies as of 11/04/2020      Reactions   Donepezil Other (See Comments)   BAD DREAMS   Namenda [memantine]    Remeron [mirtazapine] Other (See Comments)   Dizzy       Medication List       Accurate as of November 04, 2020 11:59 PM. If you have  any questions, ask your nurse or doctor.        STOP taking these medications   calcium carbonate 1500 (600 Ca) MG Tabs tablet Commonly known as: OSCAL Stopped by: Murial Beam X Hendrix Console, NP     TAKE these medications   acetaminophen 500 MG tablet Commonly known as: TYLENOL Take 1,000 mg by mouth every 8 (eight) hours as needed.   atenolol 25 MG tablet Commonly known as: TENORMIN Take 25 mg by mouth daily.   cholecalciferol 25 MCG (1000 UNIT) tablet Commonly known as: VITAMIN D Take 1,000 Units by mouth daily.   docusate sodium 100  MG capsule Commonly known as: COLACE Take 100 mg by mouth 2 (two) times daily.   hydrocortisone 2.5 % rectal cream Commonly known as: ANUSOL-HC Place 1 application rectally 2 (two) times daily.   LORazepam 0.5 MG tablet Commonly known as: ATIVAN Take 0.25 mg by mouth daily as needed for anxiety.   NON FORMULARY Diet Regular Special Instructions: Give prune juice daily with breakfast   OCUSOFT BABY EYELID & EYELASH EX Apply topically. 1 PAD BOTH EYES; CLEAN THE LIDS 2 TIMES A DAY   polyethylene glycol 17 g packet Commonly known as: MIRALAX / GLYCOLAX Take 17 g by mouth daily.   predniSONE 1 MG tablet Commonly known as: DELTASONE Take 3 mg by mouth daily.   sertraline 100 MG tablet Commonly known as: ZOLOFT Take 100 mg by mouth daily. Take with 25 mg = 125 mg.   sertraline 25 MG tablet Commonly known as: ZOLOFT Take 25 mg by mouth daily. 100 mg +25 mg= 125 mg   therapeutic multivitamin-minerals tablet Take 1 tablet by mouth daily. 400 mcg       Review of Systems  Constitutional: Negative for activity change, appetite change and fever.  HENT: Positive for hearing loss. Negative for congestion and voice change.   Eyes: Negative for visual disturbance.  Respiratory: Negative for shortness of breath.   Cardiovascular: Negative for leg swelling.  Gastrointestinal: Negative for abdominal pain and constipation.  Genitourinary: Negative for dysuria, frequency and urgency.  Musculoskeletal: Positive for arthralgias, gait problem and myalgias.  Skin: Positive for wound. Negative for color change.  Neurological: Negative for speech difficulty, weakness and headaches.       Dementia  Psychiatric/Behavioral: Positive for confusion. Negative for agitation, behavioral problems and sleep disturbance. The patient is not nervous/anxious.     Immunization History  Administered Date(s) Administered  . Influenza Whole 05/28/2012, 06/01/2018  . Influenza, High Dose Seasonal PF  06/08/2016  . Influenza-Unspecified 05/28/2014, 08/28/2014, 05/27/2015, 06/18/2017, 06/10/2020  . Moderna Sars-Covid-2 Vaccination 08/30/2019, 09/27/2019, 07/06/2020  . Pneumococcal Conjugate-13 01/21/2014  . Pneumococcal Polysaccharide-23 04/16/2015  . Td 09/29/2003  . Tdap 10/17/2020  . Zoster 01/14/2011   Pertinent  Health Maintenance Due  Topic Date Due  . INFLUENZA VACCINE  Completed  . DEXA SCAN  Completed  . PNA vac Low Risk Adult  Completed   Fall Risk  05/17/2018 05/10/2017 05/01/2017 07/17/2016 06/09/2015  Falls in the past year? No No No Yes No  Comment - - - tripped over the last stair in the house -  Number falls in past yr: - - - 1 -   Functional Status Survey:    Vitals:   11/04/20 1125  BP: (!) 150/96  Pulse: 72  Resp: (!) 22  Temp: (!) 97.3 F (36.3 C)  SpO2: 95%  Weight: 176 lb 12.8 oz (80.2 kg)  Height: 5\' 6"  (1.676 m)  Body mass index is 28.54 kg/m. Physical Exam Vitals and nursing note reviewed.  Constitutional:      Appearance: Normal appearance.  HENT:     Head: Normocephalic and atraumatic.     Mouth/Throat:     Mouth: Mucous membranes are moist.  Eyes:     Extraocular Movements: Extraocular movements intact.     Conjunctiva/sclera: Conjunctivae normal.     Pupils: Pupils are equal, round, and reactive to light.  Cardiovascular:     Rate and Rhythm: Normal rate. Rhythm irregular.     Heart sounds: No murmur heard.   Pulmonary:     Breath sounds: Rhonchi present. No rales.  Abdominal:     General: Bowel sounds are normal.     Palpations: Abdomen is soft.     Tenderness: There is no abdominal tenderness.  Musculoskeletal:     Cervical back: Normal range of motion and neck supple.     Right lower leg: No edema.     Left lower leg: No edema.  Skin:    General: Skin is warm and dry.     Findings: Bruising present.     Comments:  slow healing abrasion/wound lateral right lower leg sustained from fall 10/17/20, steri strips in place but  partially skin miss with wound bed exposure, small amount light yellow drainage on dressing, mild erythema and warmth peri wound, denied pain, no apparent swelling. Left knee abrasion is well approximated with steri strips intact, no drainage seen.    Neurological:     General: No focal deficit present.     Mental Status: She is alert. Mental status is at baseline.     Gait: Gait abnormal.     Comments: Oriented to self. Ambulates with walker.   Psychiatric:     Comments: Confused. Smiled. Followed directions.      Labs reviewed: Recent Labs    06/30/20 0000 07/07/20 0000 08/26/20 0000 10/19/20 0000  NA 141  142 141 140 143  K 3.3*  3.7 3.3* 3.8 3.7  CL 102  105 102 105 106  CO2 30*  26* 30* 29* 29*  BUN 22*  22* 22* 24* 20  CREATININE 0.7  0.7 0.7  --  0.6  CALCIUM 9.7  9.5 9.7 8.9 9.5   Recent Labs    06/30/20 0000 07/07/20 0000 10/19/20 0000  AST ALT ALKPHOS 82  70 82 72  ALBUMIN 4.2  3.7 4.2 3.5   Recent Labs    06/30/20 0000 07/07/20 0000 10/19/20 0000  WBC 7.6  8.4 7.6 8.6  NEUTROABS 3,838.00  4,662.00 3,838.00 5,212.00  HGB 15.1  14.1 15.1 13.7  HCT 46  42 46 41  PLT 275  228 275 246   Lab Results  Component Value Date   TSH 3.24 07/07/2020   Lab Results  Component Value Date   HGBA1C 5.3 04/26/2017   Lab Results  Component Value Date   CHOL 217 (A) 04/09/2018   HDL 51 04/09/2018   LDLCALC 147 04/09/2018   LDLDIRECT 173.0 08/07/2012   TRIG 86 04/09/2018   CHOLHDL 5 03/20/2016    Significant Diagnostic Results in last 30 days:  DG Elbow Complete Left  Result Date: 10/17/2020 CLINICAL DATA:  Pain EXAM: LEFT ELBOW - COMPLETE 3+ VIEW COMPARISON:  None. FINDINGS: There is no evidence of fracture, dislocation, or joint effusion. There is no evidence of arthropathy or other focal bone abnormality. Soft tissues  are unremarkable. IMPRESSION: Negative. Electronically Signed   By: Katherine Mantle M.D.    On: 10/17/2020 18:19   DG Tibia/Fibula Right  Result Date: 10/17/2020 CLINICAL DATA:  Pain status post fall EXAM: RIGHT TIBIA AND FIBULA - 2 VIEW COMPARISON:  None. FINDINGS: There is a laceration involving the lateral aspect of the right lower extremity with surrounding soft tissue swelling. There is no radiopaque foreign body. There is no underlying acute displaced fracture or dislocation. Patient is status post prior total knee arthroplasty. Calcifications are noted of the plantar fascia. IMPRESSION: Laceration of the right lower extremity without evidence for an acute displaced fracture or radiopaque foreign body. Electronically Signed   By: Katherine Mantle M.D.   On: 10/17/2020 18:15   CT Head Wo Contrast  Result Date: 10/17/2020 CLINICAL DATA:  Tripped and fell, head trauma, history Alzheimer's, hypertension EXAM: CT HEAD WITHOUT CONTRAST CT CERVICAL SPINE WITHOUT CONTRAST TECHNIQUE: Multidetector CT imaging of the head and cervical spine was performed following the standard protocol without intravenous contrast. Multiplanar CT image reconstructions of the cervical spine were also generated. COMPARISON:  01/24/2018 FINDINGS: CT HEAD FINDINGS Brain: Generalized atrophy. Normal ventricular morphology. No midline shift or mass effect. Mild small vessel chronic ischemic changes of deep cerebral white matter. No intracranial hemorrhage, mass lesion, or evidence of acute infarction. No extra-axial fluid collections. Vascular: No hyperdense vessels. Mild atherosclerotic calcification of internal carotid arteries at skull base Skull: Intact Sinuses/Orbits: Chronic opacification of sphenoid sinus. Remaining paranasal sinuses clear. Other: N/A CT CERVICAL SPINE FINDINGS Alignment: Normal Skull base and vertebrae: Osseous demineralization. Skull base intact. Diffuse disc space narrowing throughout cervical spine. Vertebral body heights maintained. No fracture, subluxation, or bone destruction. Exam degraded by  motion artifacts. Scattered mild facet degenerative changes. Soft tissues and spinal canal: Prevertebral soft tissues normal thickness. Atherosclerotic calcifications at the carotid bifurcations. Disc levels:  No specific abnormalities Upper chest: Lung apices clear Other: N/A IMPRESSION: Atrophy with small vessel chronic ischemic changes of deep cerebral white matter. No acute intracranial abnormalities. Multilevel degenerative disc and facet disease changes of the cervical spine. No acute cervical spine abnormalities identified on exam mildly limited by motion. Electronically Signed   By: Ulyses Southward M.D.   On: 10/17/2020 18:28   CT Cervical Spine Wo Contrast  Result Date: 10/17/2020 CLINICAL DATA:  Tripped and fell, head trauma, history Alzheimer's, hypertension EXAM: CT HEAD WITHOUT CONTRAST CT CERVICAL SPINE WITHOUT CONTRAST TECHNIQUE: Multidetector CT imaging of the head and cervical spine was performed following the standard protocol without intravenous contrast. Multiplanar CT image reconstructions of the cervical spine were also generated. COMPARISON:  01/24/2018 FINDINGS: CT HEAD FINDINGS Brain: Generalized atrophy. Normal ventricular morphology. No midline shift or mass effect. Mild small vessel chronic ischemic changes of deep cerebral white matter. No intracranial hemorrhage, mass lesion, or evidence of acute infarction. No extra-axial fluid collections. Vascular: No hyperdense vessels. Mild atherosclerotic calcification of internal carotid arteries at skull base Skull: Intact Sinuses/Orbits: Chronic opacification of sphenoid sinus. Remaining paranasal sinuses clear. Other: N/A CT CERVICAL SPINE FINDINGS Alignment: Normal Skull base and vertebrae: Osseous demineralization. Skull base intact. Diffuse disc space narrowing throughout cervical spine. Vertebral body heights maintained. No fracture, subluxation, or bone destruction. Exam degraded by motion artifacts. Scattered mild facet degenerative  changes. Soft tissues and spinal canal: Prevertebral soft tissues normal thickness. Atherosclerotic calcifications at the carotid bifurcations. Disc levels:  No specific abnormalities Upper chest: Lung apices clear Other: N/A IMPRESSION: Atrophy with small vessel chronic ischemic changes  of deep cerebral white matter. No acute intracranial abnormalities. Multilevel degenerative disc and facet disease changes of the cervical spine. No acute cervical spine abnormalities identified on exam mildly limited by motion. Electronically Signed   By: Ulyses Southward M.D.   On: 10/17/2020 18:28   DG Knee Complete 4 Views Left  Result Date: 10/17/2020 CLINICAL DATA:  Pain after fall EXAM: LEFT KNEE - COMPLETE 4+ VIEW COMPARISON:  None. FINDINGS: Patient is status post left knee replacement. Anterior soft tissue swelling is seen below the knee. No acute fractures are seen. No joint effusion is noted. IMPRESSION: Anterior and lateral soft tissue swelling. No fractures. Patient is status post left knee replacement. Hardware is in good position. Electronically Signed   By: Gerome Sam III M.D   On: 10/17/2020 19:58   DG Hips Bilat W or Wo Pelvis 3-4 Views  Result Date: 10/17/2020 CLINICAL DATA:  Pain EXAM: DG HIP (WITH OR WITHOUT PELVIS) 3-4V BILAT COMPARISON:  None. FINDINGS: There are mild degenerative changes of both hips. There is no acute displaced fracture. No dislocation. IMPRESSION: Mild degenerative changes of both hips. Electronically Signed   By: Katherine Mantle M.D.   On: 10/17/2020 18:17    Assessment/Plan: Infected wound slow healing abrasion/wound lateral right lower leg sustained from fall 10/17/20, steri strips in place but partially skin miss with wound bed exposure, small amount light yellow drainage on dressing, mild erythema and warmth peri wound, denied pain, no apparent swelling. Left knee abrasion is well approximated with steri strips intact, no drainage seen.  Will apply Bactroban oint bid x  7 days to the lateral right lower leg wound. Observe.   Polymyalgia rheumatica (HCC) stable, on Prednisone 3mg  qd.Prn Tylenol.Hgb 13.7 10/19/20   Slow transit constipation takes Colace, Miralax.   Essential hypertension, benign blood pressure is controlled, on Atenolol 25mg  qd. Bun/creat 20/0.6 10/19/20   Depression, recurrent (HCC) Depression/anxiety, takesSertraline 125mg  qd,TSH 3.24 07/07/20   Late onset Alzheimer's disease without behavioral disturbance (HCC) advanced, limited safety awareness, impulsive, resides memory care unit for supportive care.     Family/ staff Communication: plan of care reviewed with the patient and charge nurse.   Labs/tests ordered:  None  Time spend 35 minutes.

## 2020-11-04 NOTE — Assessment & Plan Note (Signed)
stable, on Prednisone 3mg qd.Prn Tylenol.Hgb 13.7 10/19/20  

## 2020-11-04 NOTE — Assessment & Plan Note (Signed)
blood pressure is controlled, on Atenolol 25mg qd. Bun/creat 20/0.6 10/19/20  

## 2020-11-04 NOTE — Assessment & Plan Note (Signed)
Depression/anxiety, takesSertraline 125mg  qd,TSH 3.24 07/07/20

## 2020-11-04 NOTE — Assessment & Plan Note (Signed)
takes Colace, Miralax.

## 2020-11-04 NOTE — Assessment & Plan Note (Signed)
slow healing abrasion/wound lateral right lower leg sustained from fall 10/17/20, steri strips in place but partially skin miss with wound bed exposure, small amount light yellow drainage on dressing, mild erythema and warmth peri wound, denied pain, no apparent swelling. Left knee abrasion is well approximated with steri strips intact, no drainage seen.  Will apply Bactroban oint bid x 7 days to the lateral right lower leg wound. Observe.

## 2020-11-10 ENCOUNTER — Encounter: Payer: Self-pay | Admitting: Nurse Practitioner

## 2020-12-03 ENCOUNTER — Encounter: Payer: Self-pay | Admitting: Nurse Practitioner

## 2020-12-03 ENCOUNTER — Non-Acute Institutional Stay (SKILLED_NURSING_FACILITY): Payer: Medicare PPO | Admitting: Nurse Practitioner

## 2020-12-03 DIAGNOSIS — F0280 Dementia in other diseases classified elsewhere without behavioral disturbance: Secondary | ICD-10-CM

## 2020-12-03 DIAGNOSIS — F339 Major depressive disorder, recurrent, unspecified: Secondary | ICD-10-CM | POA: Diagnosis not present

## 2020-12-03 DIAGNOSIS — G301 Alzheimer's disease with late onset: Secondary | ICD-10-CM | POA: Diagnosis not present

## 2020-12-03 DIAGNOSIS — K5901 Slow transit constipation: Secondary | ICD-10-CM

## 2020-12-03 DIAGNOSIS — M353 Polymyalgia rheumatica: Secondary | ICD-10-CM

## 2020-12-03 DIAGNOSIS — I10 Essential (primary) hypertension: Secondary | ICD-10-CM | POA: Diagnosis not present

## 2020-12-03 NOTE — Progress Notes (Signed)
Location:   [facility] Room Number: 110 Place of Service:  SNF (31) Provider:  Delayla Hoffmaster X, NP  Julio Storr X, NP  Patient Care Team: Lamara Brecht X, NP as PCP - General (Internal Medicine) Donnetta Hail, MD (Rheumatology) Ernesto Rutherford, MD (Ophthalmology) Nita Sells, MD (Dermatology) Crist Fat, MD as Attending Physician (Urology) Elanie Hammitt X, NP as Nurse Practitioner (Internal Medicine)  Extended Emergency Contact Information Primary Emergency Contact: McClanahan,Susan Address: [address] [place], [place] [address] [place] of [place] Home Phone: [phone] Mobile Phone: [phone] Relation: Daughter Secondary Emergency Contact: Gardiner Sleeper [place] of [place] Home Phone: [phone] Relation: Daughter  Code Status:  DNR Goals of care: Advanced Directive information Advanced Directives 12/03/2020  Does Patient Have a Medical Advance Directive? Yes  Type of Advance Directive Out of facility DNR (pink MOST or yellow form)  Does patient want to make changes to medical advance directive? No - Patient declined  Copy of Healthcare Power of Attorney in Chart? -  Would patient like information on creating a medical advance directive? -  Pre-existing out of facility DNR order (yellow form or pink MOST form) Pink MOST form placed in chart (order not valid for inpatient use);Yellow form placed in chart (order not valid for inpatient use)     Chief Complaint  Patient presents with  . Medical Management of Chronic Issues    Routine follow up visit.    HPI:  Pt is a 87 y.o. female seen today for medical management of chronic diseases.    Depression/anxiety, takesSertraline 125mg  qd,prn Lorazepam, TSH 3.24 07/07/20 HTN, blood pressure is controlled, on Atenolol 25mg  qd. Bun/creat 20/0.6 10/19/20 PMR, stable, on Prednisone 3mg  qd.Prn Tylenol.Hgb 13.7  10/19/20 Constipation, takes Colace, Miralax.             Dementia, advanced, limited safety awareness, impulsive, resides memory care unit for supportive care.    Past Medical History:  Diagnosis Date  . Acute bronchitis 06/27/2015  . Alzheimer's dementia (HCC)   . Cataract   . Cellulitis of left leg   . Depression   . Eczema   . High cholesterol   . History of renal stone    excision  . Hypertension   . Mild cognitive impairment   . Osteoarthritis   . Polymyalgia rheumatica (HCC)    rx with prednisone  . Urinary incontinence   . Varicose veins   . Xerosis of skin    Past Surgical History:  Procedure Laterality Date  . cataract surgery    . EYE SURGERY    . JOINT REPLACEMENT Bilateral    Dr.  . kidney stone surgey    . lipoma removal      Allergies  Allergen Reactions  . Donepezil Other (See Comments)    BAD DREAMS  . Namenda [Memantine]   . Remeron [Mirtazapine] Other (See Comments)    Dizzy     Allergies as of 12/03/2020      Reactions   Donepezil Other (See Comments)   BAD DREAMS   Namenda [memantine]    Remeron [mirtazapine] Other (See Comments)   Dizzy       Medication List       Accurate as of December 03, 2020 11:59 PM. If you have any questions, ask your nurse or doctor.        STOP taking these medications   docusate sodium 100  MG capsule Commonly known as: COLACE Stopped by: Janara Klett X Idalis Hoelting, NP     TAKE these medications   acetaminophen 500 MG tablet Commonly known as: TYLENOL Take 1,000 mg by mouth every 8 (eight) hours as needed.   atenolol 25 MG tablet Commonly known as: TENORMIN Take 25 mg by mouth daily.   cholecalciferol 25 MCG (1000 UNIT) tablet Commonly known as: VITAMIN D Take 1,000 Units by mouth daily.   hydrocortisone 2.5 % rectal cream Commonly known as: ANUSOL-HC Place 1 application rectally 2 (two) times daily.   LORazepam 0.5 MG tablet Commonly known as: ATIVAN Take 0.25 mg by mouth daily as  needed for anxiety.   NON FORMULARY Diet Regular Special Instructions: Give prune juice daily with breakfast   OCUSOFT BABY EYELID & EYELASH EX Apply topically. 1 PAD BOTH EYES; CLEAN THE LIDS 2 TIMES A DAY   polyethylene glycol 17 g packet Commonly known as: MIRALAX / GLYCOLAX Take 17 g by mouth daily.   predniSONE 1 MG tablet Commonly known as: DELTASONE Take 3 mg by mouth daily.   sertraline 100 MG tablet Commonly known as: ZOLOFT Take 100 mg by mouth daily. Take with 25 mg = 125 mg.   sertraline 25 MG tablet Commonly known as: ZOLOFT Take 25 mg by mouth daily. 100 mg +25 mg= 125 mg   therapeutic multivitamin-minerals tablet Take 1 tablet by mouth daily. 400 mcg       Review of Systems  Constitutional: Negative for fatigue, fever and unexpected weight change.  HENT: Positive for hearing loss. Negative for congestion and voice change.   Eyes: Negative for visual disturbance.  Respiratory: Negative for shortness of breath.   Cardiovascular: Negative for leg swelling.  Gastrointestinal: Negative for abdominal pain and constipation.  Genitourinary: Negative for dysuria, frequency and urgency.  Musculoskeletal: Positive for arthralgias, gait problem and myalgias.  Skin: Negative for color change.  Neurological: Negative for speech difficulty, weakness and light-headedness.       Dementia  Psychiatric/Behavioral: Positive for confusion. Negative for sleep disturbance. The patient is not nervous/anxious.     Immunization History  Administered Date(s) Administered  . Influenza Whole 05/28/2012, 06/01/2018  . Influenza, High Dose Seasonal PF 06/08/2016  . Influenza-Unspecified 05/28/2014, 08/28/2014, 05/27/2015, 06/18/2017, 06/10/2020  . Moderna Sars-Covid-2 Vaccination 08/30/2019, 09/27/2019, 07/06/2020  . Pneumococcal Conjugate-13 01/21/2014  . Pneumococcal Polysaccharide-23 04/16/2015  . Td 09/29/2003  . Tdap 10/17/2020  . Zoster 01/14/2011   Pertinent  Health  Maintenance Due  Topic Date Due  . INFLUENZA VACCINE  03/28/2021  . DEXA SCAN  Completed  . PNA vac Low Risk Adult  Completed   Fall Risk  05/17/2018 05/10/2017 05/01/2017 07/17/2016 06/09/2015  Falls in the past year? No No No Yes No  Comment - - - tripped over the last stair in the house -  Number falls in past yr: - - - 1 -   Functional Status Survey:    Vitals:   12/03/20 1136  BP: 136/88  Pulse: 80  Resp: 20  Temp: 97.6 F (36.4 C)  Weight: 174 lb (78.9 kg)  Height: 5\' 6"  (1.676 m)   Body mass index is 28.08 kg/m. Physical Exam Vitals and nursing note reviewed.  Constitutional:      Appearance: Normal appearance.  HENT:     Head: Normocephalic and atraumatic.     Mouth/Throat:     Mouth: Mucous membranes are moist.  Eyes:     Extraocular Movements: Extraocular movements intact.  Conjunctiva/sclera: Conjunctivae normal.     Pupils: Pupils are equal, round, and reactive to light.  Cardiovascular:     Rate and Rhythm: Normal rate. Rhythm irregular.     Heart sounds: No murmur heard.   Pulmonary:     Effort: Pulmonary effort is normal.     Breath sounds: No rales.  Abdominal:     General: Bowel sounds are normal.     Palpations: Abdomen is soft.     Tenderness: There is no abdominal tenderness.  Musculoskeletal:     Cervical back: Normal range of motion and neck supple.     Right lower leg: No edema.     Left lower leg: No edema.  Skin:    General: Skin is warm and dry.  Neurological:     General: No focal deficit present.     Mental Status: She is alert. Mental status is at baseline.     Gait: Gait abnormal.     Comments: Oriented to self. Ambulates with walker.   Psychiatric:     Comments: Confused. Smiled. Followed directions.      Labs reviewed: Recent Labs    06/30/20 0000 07/07/20 0000 08/26/20 0000 10/19/20 0000  NA 141  142 141 140 143  K 3.3*  3.7 3.3* 3.8 3.7  CL 102  105 102 105 106  CO2 30*  26* 30* 29* 29*  BUN 22*  22*  22* 24* 20  CREATININE 0.7  0.7 0.7  --  0.6  CALCIUM 9.7  9.5 9.7 8.9 9.5   Recent Labs    06/30/20 0000 07/07/20 0000 10/19/20 0000  AST 18  18 18 18   ALT 18  16 18 16   ALKPHOS 82  70 82 72  ALBUMIN 4.2  3.7 4.2 3.5   Recent Labs    06/30/20 0000 07/07/20 0000 10/19/20 0000  WBC 7.6  8.4 7.6 8.6  NEUTROABS 3,838.00  4,662.00 3,838.00 5,212.00  HGB 15.1  14.1 15.1 13.7  HCT 46  42 46 41  PLT 275  228 275 246   Lab Results  Component Value Date   TSH 3.24 07/07/2020   Lab Results  Component Value Date   HGBA1C 5.3 04/26/2017   Lab Results  Component Value Date   CHOL 217 (A) 04/09/2018   HDL 51 04/09/2018   LDLCALC 147 04/09/2018   LDLDIRECT 173.0 08/07/2012   TRIG 86 04/09/2018   CHOLHDL 5 03/20/2016    Significant Diagnostic Results in last 30 days:  No results found.  Assessment/Plan Essential hypertension, benign blood pressure is controlled, on Atenolol 25mg  qd. Bun/creat 20/0.6 10/19/20   Polymyalgia rheumatica (HCC) stable, on Prednisone 3mg  qd.Prn Tylenol.Hgb 13.7 10/19/20   Slow transit constipation takes Colace, Miralax.   Late onset Alzheimer's disease without behavioral disturbance (HCC) advanced, limited safety awareness, impulsive, resides memory care unit for supportive care.    Depression, recurrent (HCC) Depression/anxiety, takesSertraline 125mg  qd,prn Lorazepam, TSH 3.24 07/07/20     Family/ staff Communication: plan of care reviewed with the patient and charge nurse.   Labs/tests ordered:  none  Time spend 35 minutes.

## 2020-12-06 ENCOUNTER — Encounter: Payer: Self-pay | Admitting: Nurse Practitioner

## 2020-12-06 NOTE — Assessment & Plan Note (Signed)
blood pressure is controlled, on Atenolol 25mg  qd. Bun/creat 20/0.6 10/19/20

## 2020-12-06 NOTE — Assessment & Plan Note (Signed)
takes Colace, Miralax. 

## 2020-12-06 NOTE — Assessment & Plan Note (Signed)
Depression/anxiety, takesSertraline 125mg  qd,prn Lorazepam, TSH 3.24 07/07/20

## 2020-12-06 NOTE — Assessment & Plan Note (Signed)
stable, on Prednisone 3mg  qd.Prn Tylenol.Hgb 13.7 10/19/20

## 2020-12-06 NOTE — Assessment & Plan Note (Signed)
advanced, limited safety awareness, impulsive, resides memory care unit for supportive care.  

## 2021-01-03 ENCOUNTER — Encounter: Payer: Self-pay | Admitting: Nurse Practitioner

## 2021-01-03 ENCOUNTER — Non-Acute Institutional Stay (SKILLED_NURSING_FACILITY): Payer: Medicare PPO | Admitting: Nurse Practitioner

## 2021-01-03 DIAGNOSIS — I10 Essential (primary) hypertension: Secondary | ICD-10-CM | POA: Diagnosis not present

## 2021-01-03 DIAGNOSIS — M353 Polymyalgia rheumatica: Secondary | ICD-10-CM | POA: Diagnosis not present

## 2021-01-03 DIAGNOSIS — F0280 Dementia in other diseases classified elsewhere without behavioral disturbance: Secondary | ICD-10-CM

## 2021-01-03 DIAGNOSIS — F339 Major depressive disorder, recurrent, unspecified: Secondary | ICD-10-CM | POA: Diagnosis not present

## 2021-01-03 DIAGNOSIS — G301 Alzheimer's disease with late onset: Secondary | ICD-10-CM | POA: Diagnosis not present

## 2021-01-03 DIAGNOSIS — R509 Fever, unspecified: Secondary | ICD-10-CM

## 2021-01-03 DIAGNOSIS — K5901 Slow transit constipation: Secondary | ICD-10-CM | POA: Diagnosis not present

## 2021-01-03 NOTE — Assessment & Plan Note (Addendum)
fever x2 days, vomited, loose stools, no apparent SOB or O2 desaturation. Negative COVID test. The patient asks water, water during my examination, but retched, she was noted dry mouth/lips, warm/flushed skin. Will update CBC/diff, CMP/eGFR, CXR ap/lateral, schedule Tylenol 668m tid with meals x 7 days. IVF D5 1/2 NS 75cc/hr x 20043m C-diff Toxin A/B x3 stools if diarrhea develops. Rocephin 1gm bid x 7 days along with Florastor bid.  HPOA desires Hospice service, dc all meds except Lorazepam, Morphine, Tylenol for comfort measures.

## 2021-01-03 NOTE — Assessment & Plan Note (Signed)
Depression/anxiety, takesSertraline 125mg  qd,prn Lorazepam, TSH 3.24 07/07/20

## 2021-01-03 NOTE — Assessment & Plan Note (Addendum)
advanced, limited safety awareness, impulsive, resides memory care unit for supportive care.desires Hospice service.

## 2021-01-03 NOTE — Assessment & Plan Note (Signed)
blood pressure is controlled, on Atenolol 25mg  qd.Bun/creat 20/0.6 10/19/20

## 2021-01-03 NOTE — Assessment & Plan Note (Signed)
takes Colace, Miralax.

## 2021-01-03 NOTE — Progress Notes (Signed)
Location:   [facility] Room Number: 110 Place of Service:  SNF (31) Provider: Lennie Odor Syan Cullimore NP  Regnia Mathwig X, NP  Patient Care Team: Ahnesty Finfrock X, NP as PCP - General (Internal Medicine) Hennie Duos, MD (Rheumatology) Clent Jacks, MD (Ophthalmology) Allyn Kenner, MD (Dermatology) Ardis Hughs, MD as Attending Physician (Urology) Tyshea Imel X, NP as Nurse Practitioner (Internal Medicine)  Extended Emergency Contact Information Primary Emergency Contact: McClanahan,Susan Address: [place], [place] [address] Johnnette Litter of [place] Phone: [phone] Mobile Phone: [phone] Relation: Daughter Secondary Emergency Contact: Newman Nip [place] of [place] Phone: [phone] Relation: Daughter  Code Status: DNR Goals of care: Advanced Directive information Advanced Directives 12/03/2020  Does Patient Have a Medical Advance Directive? Yes  Type of Advance Directive Out of facility DNR (pink MOST or yellow form)  Does patient want to make changes to medical advance directive? No - Patient declined  Copy of [place] in Chart? -  Would patient like information on creating a medical advance directive? -  Pre-existing out of facility DNR order (yellow form or pink MOST form) Pink MOST form placed in chart (order not valid for inpatient use);Yellow form placed in chart (order not valid for inpatient use)     Chief Complaint  Patient presents with  . Acute Visit    fever    HPI:  Pt is a 87 y.o. female seen today for an acute visit for fever x2 days, vomited, loose stools, no apparent SOB or O2 desaturation. Negative COVID test.   Depression/anxiety, takesSertraline 128m qd,prn Lorazepam, TSH 3.24 07/07/20 HTN, blood pressure is controlled, on Atenolol 257mqd.Bun/creat 20/0.6 10/19/20 PMR, stable, on Prednisone 58m75md.Prn Tylenol.Hgb 13.7  10/19/20 Constipation, takes Colace, Miralax. Dementia, advanced, limited safety awareness, impulsive, resides memory care unit for supportive care.       Past Medical History:  Diagnosis Date  . Acute bronchitis 06/27/2015  . Alzheimer's dementia (HCCAtkinson . Cataract   . Cellulitis of left leg   . Depression   . Eczema   . High cholesterol   . History of renal stone    excision  . Hypertension   . Mild cognitive impairment   . Osteoarthritis   . Polymyalgia rheumatica (HCC)    rx with prednisone  . Urinary incontinence   . Varicose veins   . Xerosis of skin    Past Surgical History:  Procedure Laterality Date  . cataract surgery    . EYE SURGERY    . JOINT REPLACEMENT Bilateral    Dr. FraHector Shade kidney stone surgey    . lipoma removal      Allergies  Allergen Reactions  . Donepezil Other (See Comments)    BAD DREAMS  . Namenda [Memantine]   . Remeron [Mirtazapine] Other (See Comments)    Dizzy     Allergies as of 01/03/2021      Reactions   Donepezil Other (See Comments)   BAD DREAMS   Namenda [memantine]    Remeron [mirtazapine] Other (See Comments)   Dizzy       Medication List       Accurate as of Jan 03, 2021 11:59 PM. If you have any questions, ask your nurse or doctor.        acetaminophen 500 MG tablet Commonly known as: TYLENOL Take 1,000 mg by mouth every 8 (eight)  hours as needed.   atenolol 25 MG tablet Commonly known as: TENORMIN Take 25 mg by mouth daily.   cholecalciferol 25 MCG (1000 UNIT) tablet Commonly known as: VITAMIN D Take 1,000 Units by mouth daily.   hydrocortisone 2.5 % rectal cream Commonly known as: ANUSOL-HC Place 1 application rectally 2 (two) times daily.   LORazepam 0.5 MG tablet Commonly known as: ATIVAN Take 0.25 mg by mouth daily as needed for anxiety.   NON FORMULARY Diet Regular Special Instructions: Give prune juice daily with breakfast   OCUSOFT BABY EYELID & EYELASH  EX Apply topically. 1 PAD BOTH EYES; CLEAN THE LIDS 2 TIMES A DAY   polyethylene glycol 17 g packet Commonly known as: MIRALAX / GLYCOLAX Take 17 g by mouth daily.   predniSONE 1 MG tablet Commonly known as: DELTASONE Take 3 mg by mouth daily.   sertraline 100 MG tablet Commonly known as: ZOLOFT Take 100 mg by mouth daily. Take with 25 mg = 125 mg.   sertraline 25 MG tablet Commonly known as: ZOLOFT Take 25 mg by mouth daily. 100 mg +25 mg= 125 mg   therapeutic multivitamin-minerals tablet Take 1 tablet by mouth daily. 400 mcg       Review of Systems  Constitutional: Positive for activity change, appetite change, fatigue and fever.  HENT: Positive for hearing loss. Negative for congestion and voice change.   Eyes: Negative for visual disturbance.  Respiratory: Negative for shortness of breath.   Cardiovascular: Negative for leg swelling.  Gastrointestinal: Positive for vomiting. Negative for abdominal distention, abdominal pain and constipation.       Reported some loose stools.   Genitourinary: Negative for dysuria, frequency and urgency.  Musculoskeletal: Positive for arthralgias, gait problem and myalgias.  Skin: Negative for color change.  Neurological: Negative for speech difficulty, weakness and light-headedness.       Dementia  Psychiatric/Behavioral: Positive for confusion. Negative for sleep disturbance. The patient is not nervous/anxious.     Immunization History  Administered Date(s) Administered  . Influenza Whole 05/28/2012, 06/01/2018  . Influenza, High Dose Seasonal PF 06/08/2016  . Influenza-Unspecified 05/28/2014, 08/28/2014, 05/27/2015, 06/18/2017, 06/10/2020  . Moderna Sars-Covid-2 Vaccination 08/30/2019, 09/27/2019, 07/06/2020  . Pneumococcal Conjugate-13 01/21/2014  . Pneumococcal Polysaccharide-23 04/16/2015  . Td 09/29/2003  . Tdap 10/17/2020  . Zoster 01/14/2011   Pertinent  Health Maintenance Due  Topic Date Due  . INFLUENZA VACCINE   03/28/2021  . DEXA SCAN  Completed  . PNA vac Low Risk Adult  Completed   Fall Risk  05/17/2018 05/10/2017 05/01/2017 07/17/2016 06/09/2015  Falls in the past year? No No No Yes No  Comment - - - tripped over the last stair in the house -  Number falls in past yr: - - - 1 -   Functional Status Survey:    Vitals:   01/03/21 1150  BP: 100/62  Pulse: 83  Resp: 18  Temp: (!) 101.2 F (38.4 C)  SpO2: 93%   There is no height or weight on file to calculate BMI. Physical Exam Vitals and nursing note reviewed.  Constitutional:      Appearance: She is ill-appearing.     Comments: Appears tired.   HENT:     Head: Normocephalic and atraumatic.     Mouth/Throat:     Mouth: Mucous membranes are dry.  Eyes:     Extraocular Movements: Extraocular movements intact.     Conjunctiva/sclera: Conjunctivae normal.     Pupils: Pupils are equal, round, and  reactive to light.  Cardiovascular:     Rate and Rhythm: Normal rate. Rhythm irregular.     Heart sounds: No murmur heard.   Pulmonary:     Effort: Pulmonary effort is normal.     Breath sounds: No wheezing, rhonchi or rales.  Abdominal:     General: Bowel sounds are normal. There is no distension.     Palpations: Abdomen is soft.     Tenderness: There is no abdominal tenderness. There is no right CVA tenderness, left CVA tenderness, guarding or rebound.  Musculoskeletal:     Cervical back: Normal range of motion and neck supple.     Right lower leg: No edema.     Left lower leg: No edema.  Skin:    General: Skin is warm and dry.  Neurological:     General: No focal deficit present.     Mental Status: She is alert. Mental status is at baseline.     Gait: Gait abnormal.     Comments: Oriented to self. Ambulates with walker.   Psychiatric:     Comments: Confused. Smiled. Followed directions.      Labs reviewed: Recent Labs    06/30/20 0000 07/07/20 0000 08/26/20 0000 10/19/20 0000  NA 141  142 141 140 143  K 3.3*  3.7  3.3* 3.8 3.7  CL 102  105 102 105 106  CO2 30*  26* 30* 29* 29*  BUN 22*  22* 22* 24* 20  CREATININE 0.7  0.7 0.7  --  0.6  CALCIUM 9.7  9.5 9.7 8.9 9.5   Recent Labs    06/30/20 0000 07/07/20 0000 10/19/20 0000  AST 18  18 18 18   ALT 18  16 18 16   ALKPHOS 82  70 82 72  ALBUMIN 4.2  3.7 4.2 3.5   Recent Labs    06/30/20 0000 07/07/20 0000 10/19/20 0000  WBC 7.6  8.4 7.6 8.6  NEUTROABS 3,838.00  4,662.00 3,838.00 5,212.00  HGB 15.1  14.1 15.1 13.7  HCT 46  42 46 41  PLT 275  228 275 246   Lab Results  Component Value Date   TSH 3.24 07/07/2020   Lab Results  Component Value Date   HGBA1C 5.3 04/26/2017   Lab Results  Component Value Date   CHOL 217 (A) 04/09/2018   HDL 51 04/09/2018   LDLCALC 147 04/09/2018   LDLDIRECT 173.0 08/07/2012   TRIG 86 04/09/2018   CHOLHDL 5 03/20/2016    Significant Diagnostic Results in last 30 days:  No results found.  Assessment/Plan: Fever fever x2 days, vomited, loose stools, no apparent SOB or O2 desaturation. Negative COVID test. The patient asks water, water during my examination, but retched, she was noted dry mouth/lips, warm/flushed skin. Will update CBC/diff, CMP/eGFR, CXR ap/lateral, schedule Tylenol 631m tid with meals x 7 days. IVF D5 1/2 NS 75cc/hr x 2004m C-diff Toxin A/B x3 stools if diarrhea develops. Rocephin 1gm bid x 7 days along with Florastor bid.  HPOA desires Hospice service, dc all meds except Lorazepam, Morphine, Tylenol for comfort measures.   Depression, recurrent (HCHelenaDepression/anxiety, takesSertraline 12534md,prn Lorazepam, TSH 3.24 07/07/20  Essential hypertension, benign  blood pressure is controlled, on Atenolol 37m44m.Bun/creat 20/0.6 10/19/20   Polymyalgia rheumatica (HCC) stable, on Prednisone 3mg 68mPrn Tylenol.Hgb 13.7 10/19/20   Slow transit constipation  takes Colace, Miralax.   Late onset Alzheimer's disease without behavioral disturbance  (HCC) advanced, limited safety awareness, impulsive, resides memory care unit for  supportive care.desires Hospice service.      Family/ staff Communication: plan of care reviewed with the patient and charge nurse.   Labs/tests ordered:  CBC/diff, CMP/eGFR, UA C/S, CXR ap/lateral.  Time spend 35 minutes.

## 2021-01-03 NOTE — Assessment & Plan Note (Signed)
stable, on Prednisone 3mg  qd.Prn Tylenol.Hgb 13.7 10/19/20

## 2021-01-26 DEATH — deceased

## 2021-04-12 IMAGING — CT CT CERVICAL SPINE W/O CM
4 of 5 series · 13 of 33 positions shown, 15 images · non-contrast
Comparison: 01/24/2018

CLINICAL DATA: Tripped and fell, head trauma, history Alzheimer's,
hypertension

EXAM:
CT HEAD WITHOUT CONTRAST
CT CERVICAL SPINE WITHOUT CONTRAST
TECHNIQUE: Multidetector CT imaging of the head and cervical spine was
performed following the standard protocol without intravenous
contrast. Multiplanar CT image reconstructions of the cervical spine
were also generated.

[Series 4: c spine soft · axial · 0.34mm/px · z∈[-432,-334]mm · 3 of 99 slices shown]
[im 25/99  soft-tissue]
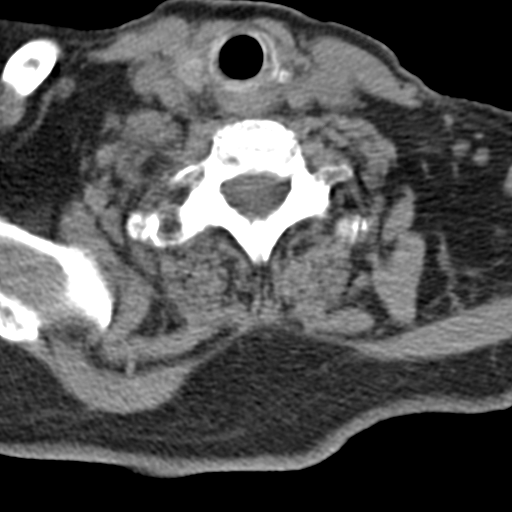
[im 50/99  soft-tissue]
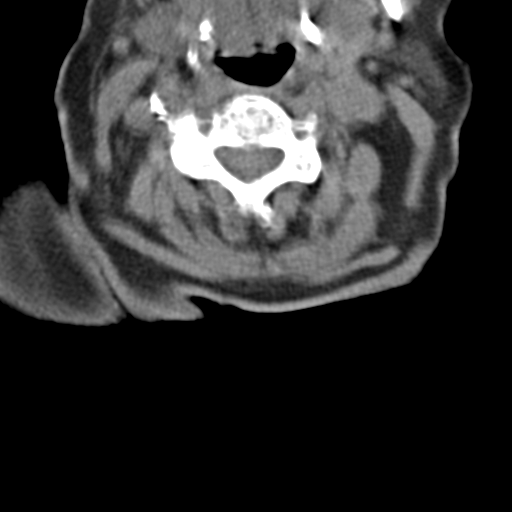
[im 74/99  soft-tissue]
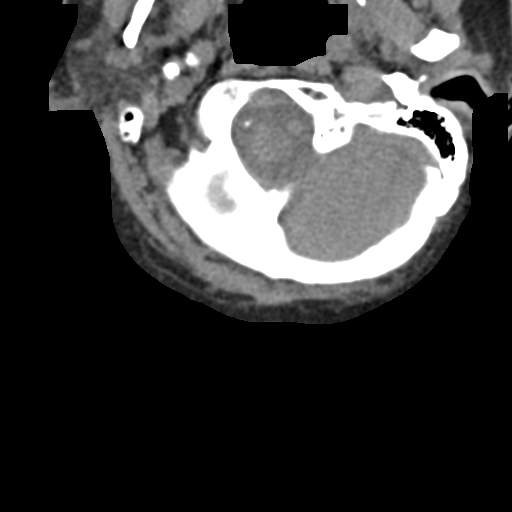

[Series 9: orthogonal bone · axial · 0.23mm/px · z∈[-413,-353]mm · 2 of 91 slices shown, 3 images]
[im 31/91  soft-tissue]
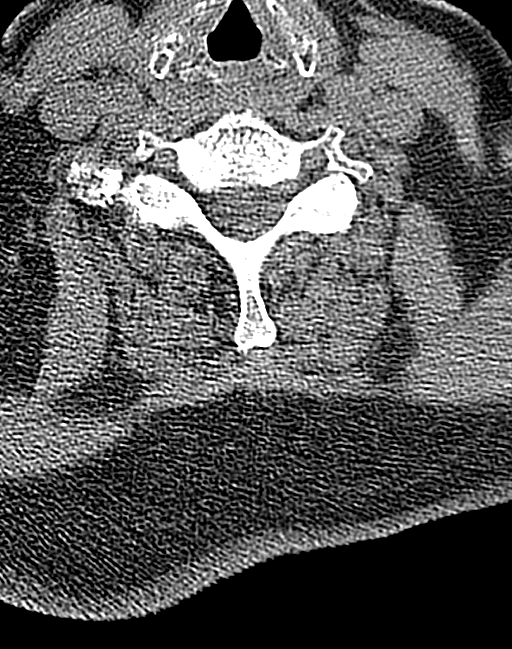
[im 31/91  bone]
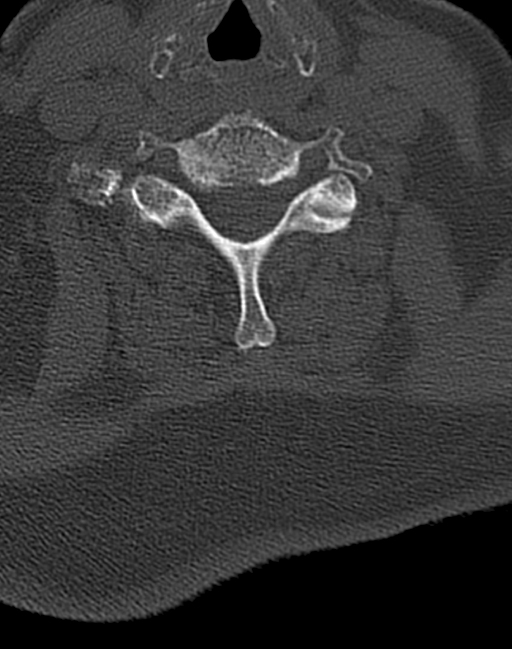
[im 61/91  bone]
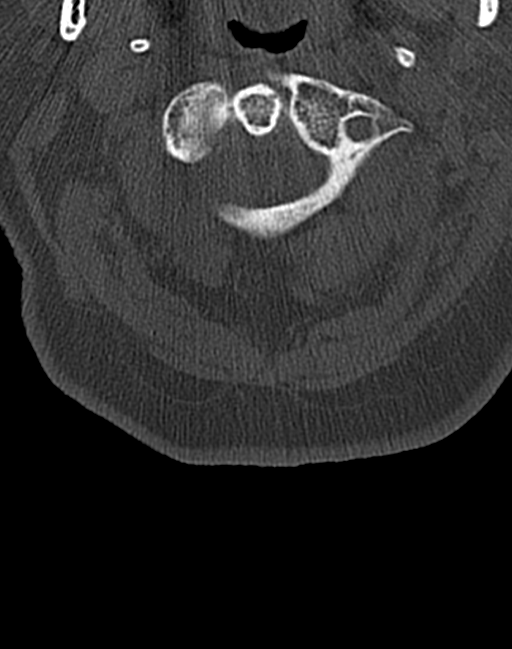

[Series 10: coronal bone · coronal · 0.29mm/px · 3 of 65 slices shown]
[im 13/65  bone]
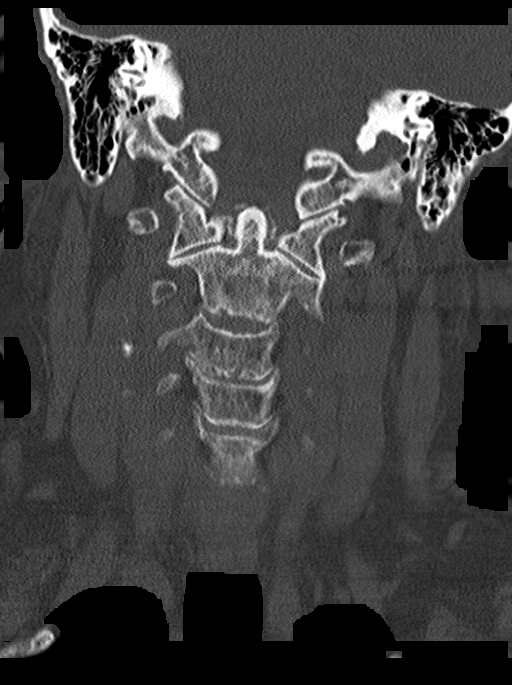
[im 26/65  bone]
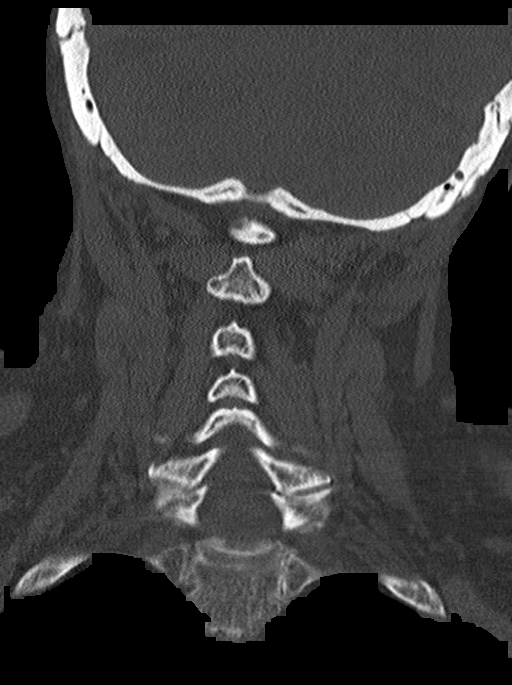
[im 39/65  bone]
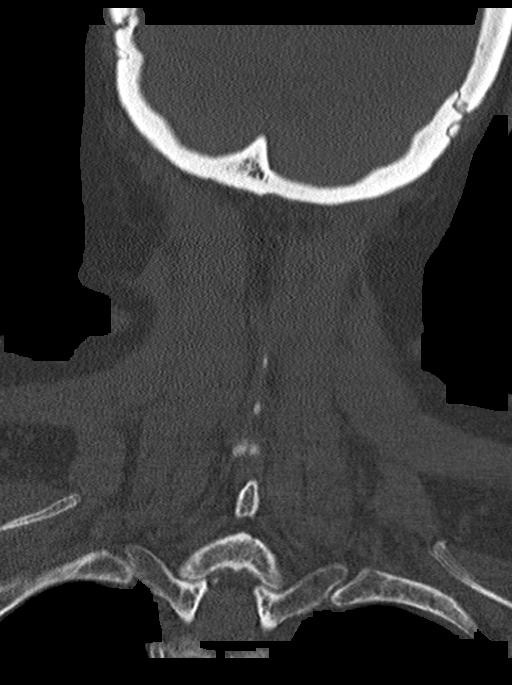

[Series 11: sagittal bone · sagittal · 0.29mm/px · 5 of 61 slices shown, 6 images]
[im 21/61  bone]
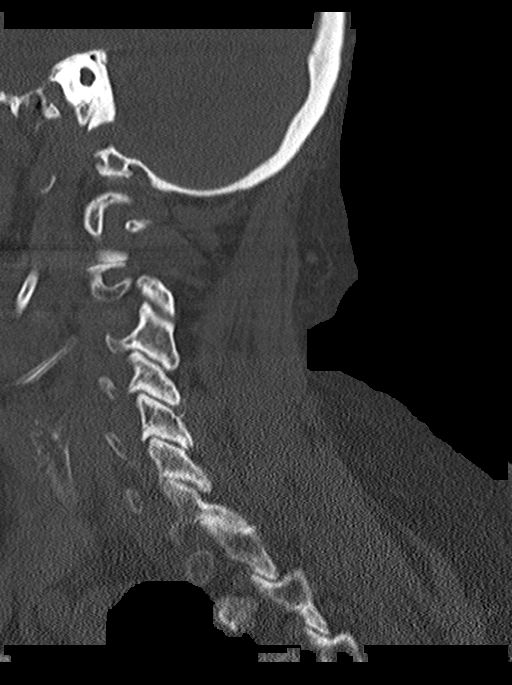
[im 26/61  bone]
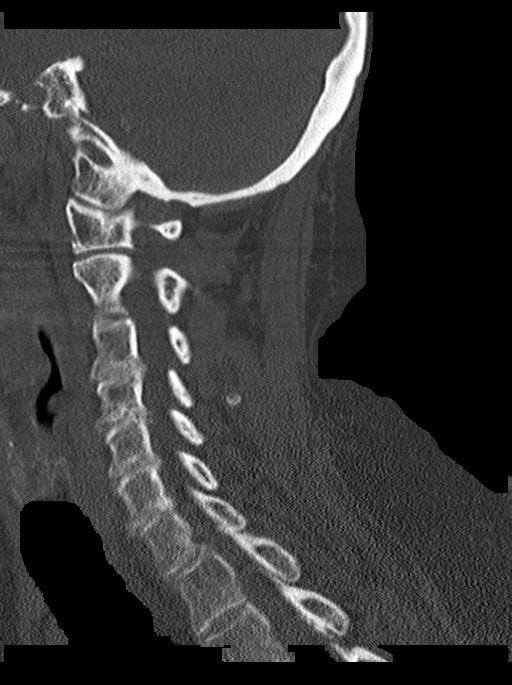
[im 31/61  soft-tissue]
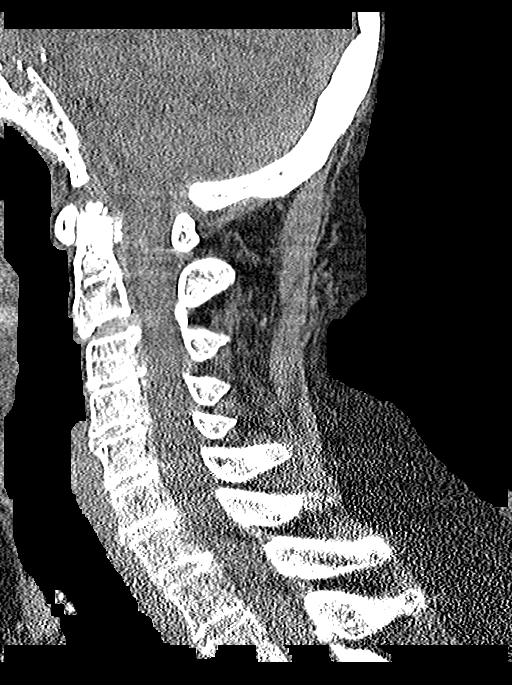
[im 31/61  bone]
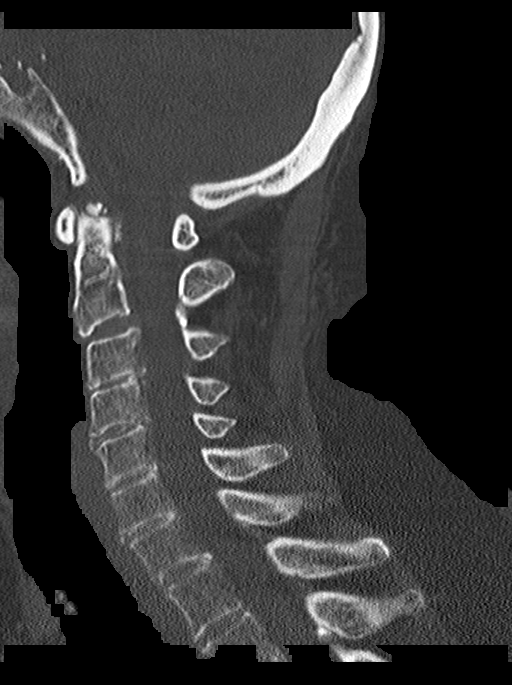
[im 36/61  bone]
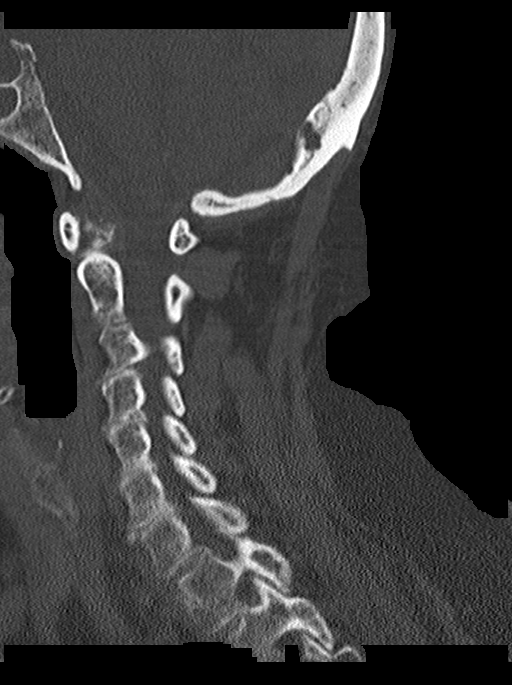
[im 41/61  bone]
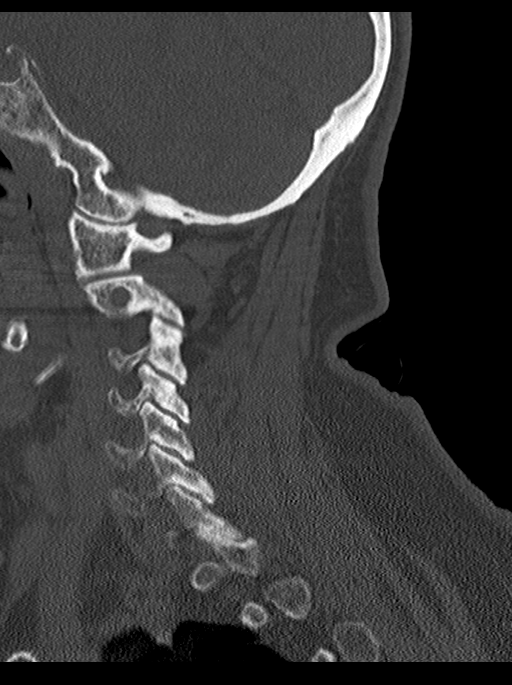

[13 of 33 positions shown; findings below may reference images not displayed]

FINDINGS: CT HEAD FINDINGS

Brain: Generalized atrophy. Normal ventricular morphology. No
midline shift or mass effect. Mild small vessel chronic ischemic
changes of deep cerebral white matter. No intracranial hemorrhage,
mass lesion, or evidence of acute infarction. No extra-axial fluid
collections.

Vascular: No hyperdense vessels. Mild atherosclerotic calcification
of internal carotid arteries at skull base

Skull: Intact

Sinuses/Orbits: Chronic opacification of sphenoid sinus. Remaining
paranasal sinuses clear.

Other: N/A

CT CERVICAL SPINE FINDINGS

Alignment: Normal

Skull base and vertebrae: Osseous demineralization. Skull base
intact. Diffuse disc space narrowing throughout cervical spine.
Vertebral body heights maintained. No fracture, subluxation, or bone
destruction. Exam degraded by motion artifacts. Scattered mild facet
degenerative changes.

Soft tissues and spinal canal: Prevertebral soft tissues normal
thickness. Atherosclerotic calcifications at the carotid
bifurcations.

Disc levels:  No specific abnormalities

Upper chest: Lung apices clear

Other: N/A
IMPRESSION: Atrophy with small vessel chronic ischemic changes of deep cerebral
white matter.

No acute intracranial abnormalities.

Multilevel degenerative disc and facet disease changes of the
cervical spine.

No acute cervical spine abnormalities identified on exam mildly
limited by motion.

## 2021-04-12 IMAGING — CR DG HIP (WITH OR WITHOUT PELVIS) 3-4V BILAT
5 series · 5 of 5 positions shown · non-contrast
Comparison: None.

CLINICAL DATA: Pain

EXAM:
DG HIP (WITH OR WITHOUT PELVIS) 3-4V BILAT

[x pelvis]
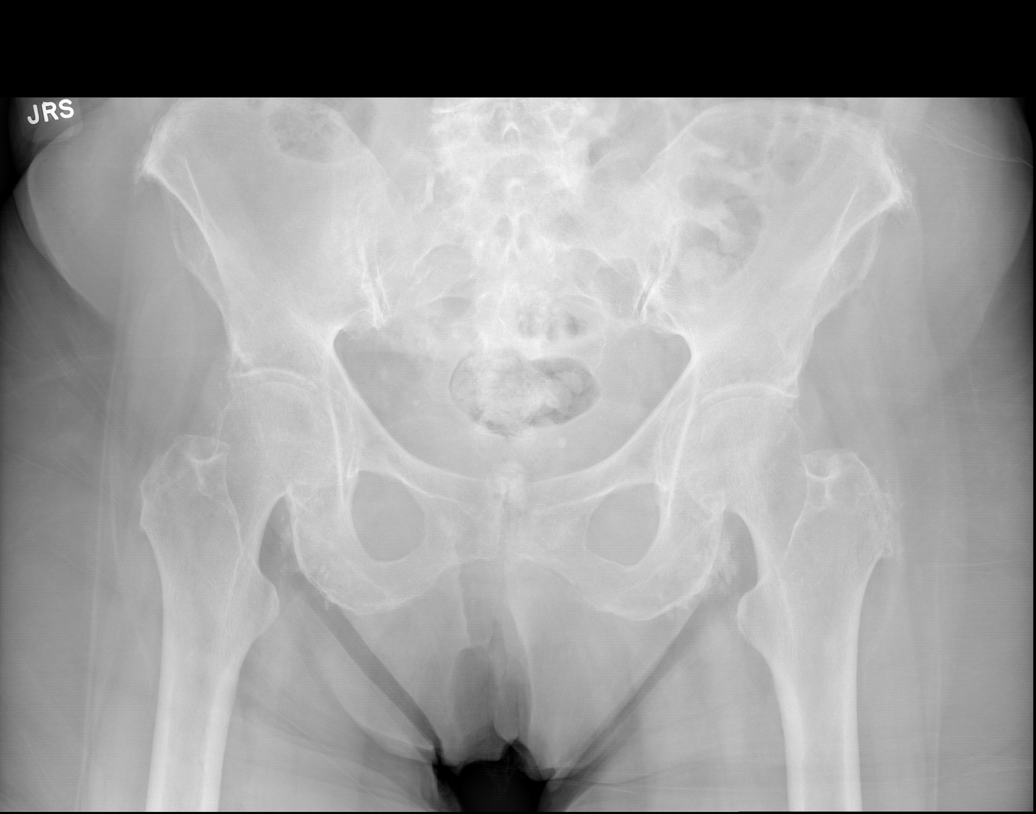

[x hip ap left]
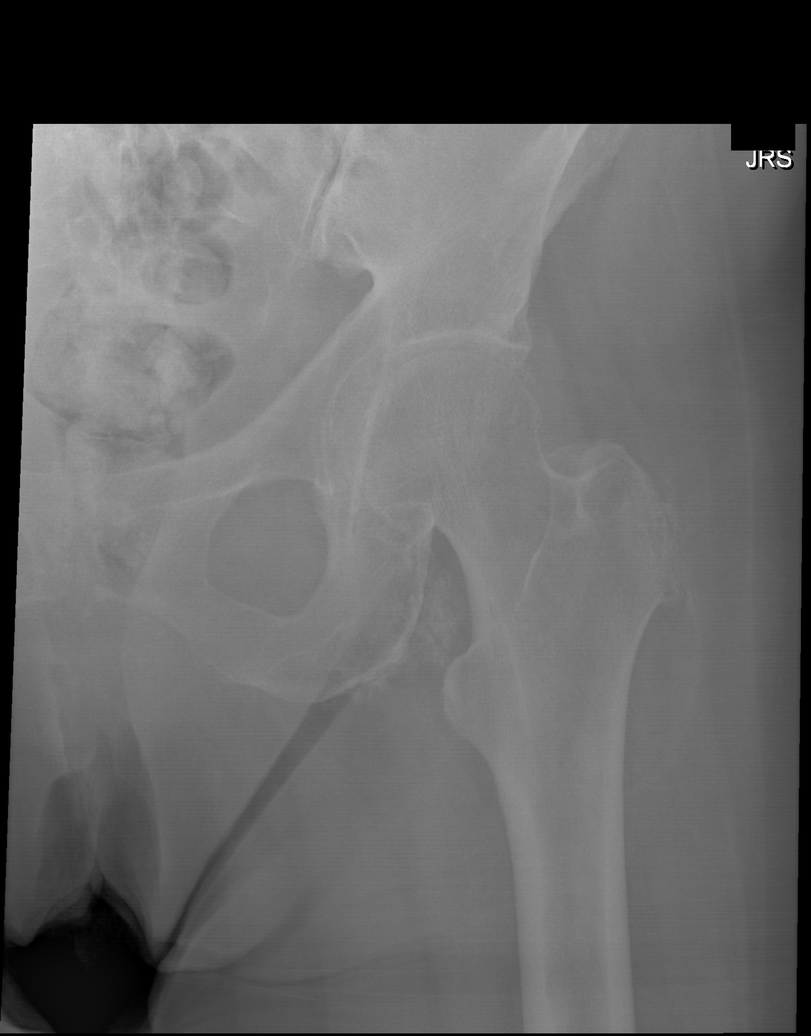

[x hip ap right]
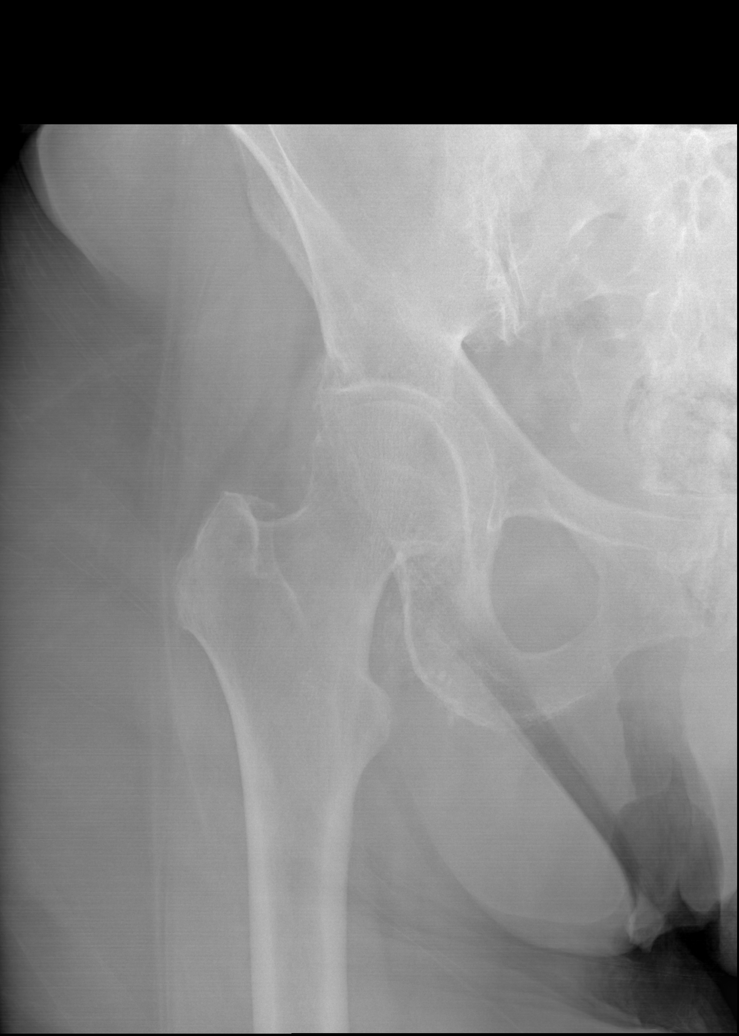

[x hip lat right]
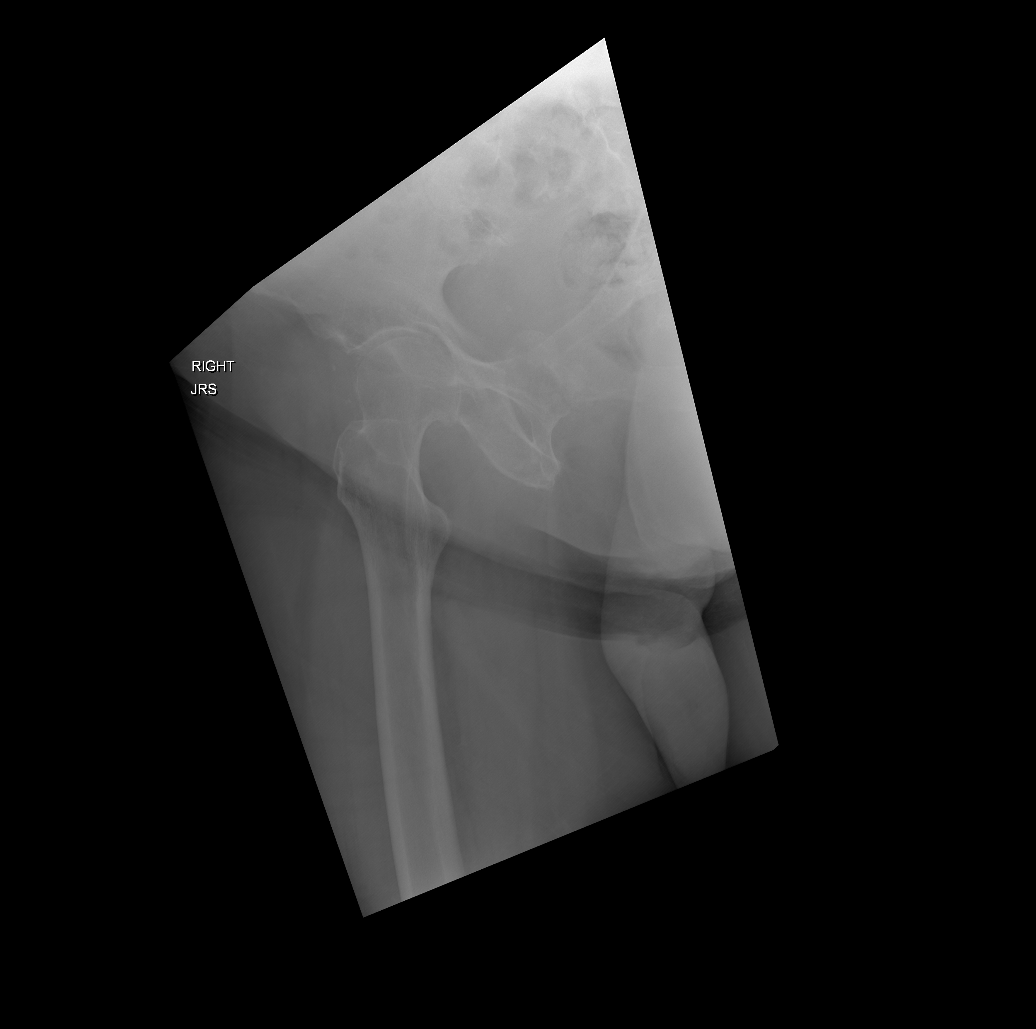

[x hip lat left]
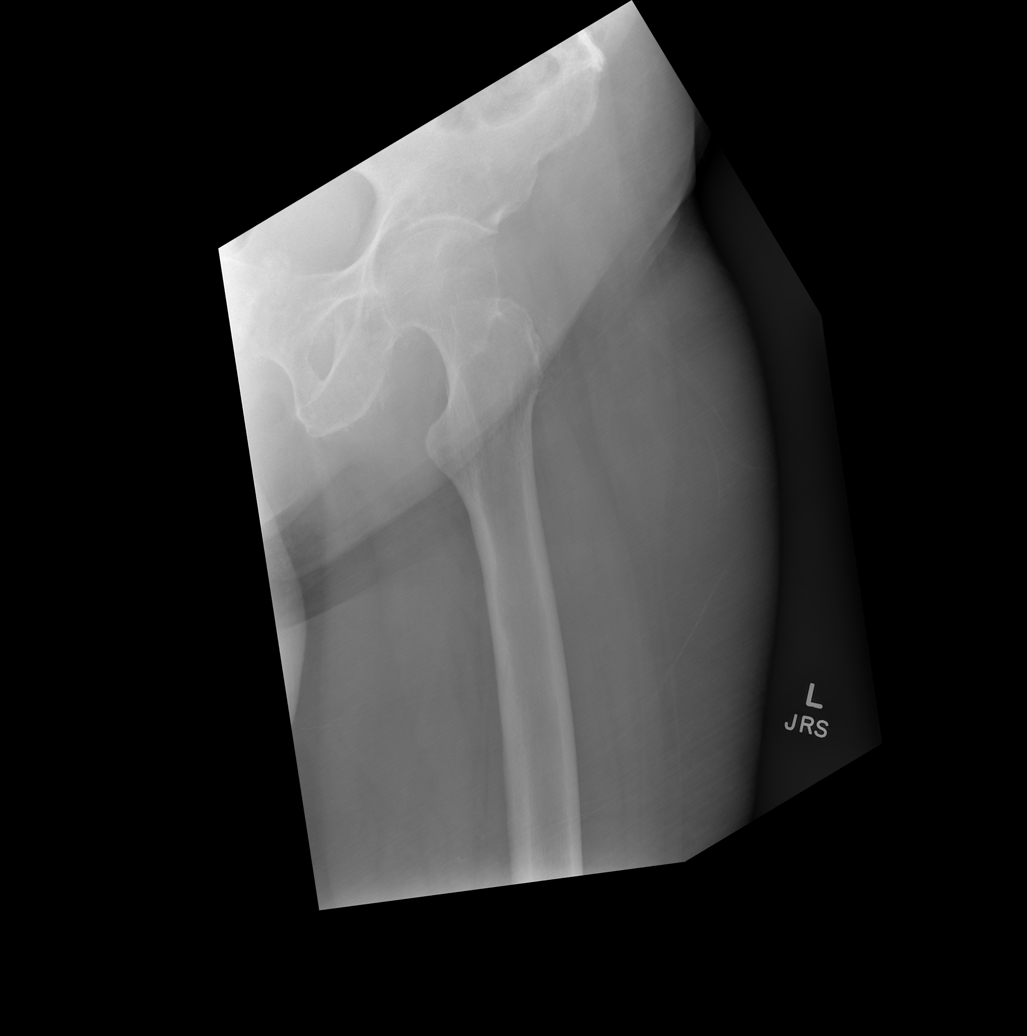

[5 of 5 positions shown; findings below may reference images not displayed]

FINDINGS: There are mild degenerative changes of both hips. There is no acute
displaced fracture. No dislocation.
IMPRESSION: Mild degenerative changes of both hips.
